# Patient Record
Sex: Female | Born: 1980 | Race: White | Hispanic: No | Marital: Married | Smoking: Never smoker
Health system: Southern US, Community
[De-identification: ages and names within clinical notes are randomized; demographics above are authoritative.]

## PROBLEM LIST (undated history)

## (undated) ENCOUNTER — Emergency Department: Discharge status: Absent without leave | Payer: Medicare Other

## (undated) DIAGNOSIS — F988 Other specified behavioral and emotional disorders with onset usually occurring in childhood and adolescence: Secondary | ICD-10-CM

## (undated) DIAGNOSIS — K219 Gastro-esophageal reflux disease without esophagitis: Secondary | ICD-10-CM

## (undated) DIAGNOSIS — K828 Other specified diseases of gallbladder: Secondary | ICD-10-CM

## (undated) DIAGNOSIS — F329 Major depressive disorder, single episode, unspecified: Secondary | ICD-10-CM

## (undated) DIAGNOSIS — E039 Hypothyroidism, unspecified: Secondary | ICD-10-CM

## (undated) DIAGNOSIS — G8929 Other chronic pain: Secondary | ICD-10-CM

## (undated) DIAGNOSIS — Z8719 Personal history of other diseases of the digestive system: Secondary | ICD-10-CM

## (undated) DIAGNOSIS — M1990 Unspecified osteoarthritis, unspecified site: Secondary | ICD-10-CM

## (undated) DIAGNOSIS — M069 Rheumatoid arthritis, unspecified: Secondary | ICD-10-CM

## (undated) DIAGNOSIS — J45909 Unspecified asthma, uncomplicated: Secondary | ICD-10-CM

## (undated) DIAGNOSIS — S0300XA Dislocation of jaw, unspecified side, initial encounter: Secondary | ICD-10-CM

## (undated) DIAGNOSIS — E119 Type 2 diabetes mellitus without complications: Secondary | ICD-10-CM

## (undated) DIAGNOSIS — M797 Fibromyalgia: Secondary | ICD-10-CM

## (undated) DIAGNOSIS — F32A Depression, unspecified: Secondary | ICD-10-CM

## (undated) DIAGNOSIS — E079 Disorder of thyroid, unspecified: Secondary | ICD-10-CM

## (undated) DIAGNOSIS — F819 Developmental disorder of scholastic skills, unspecified: Secondary | ICD-10-CM

## (undated) DIAGNOSIS — J42 Unspecified chronic bronchitis: Secondary | ICD-10-CM

## (undated) DIAGNOSIS — F419 Anxiety disorder, unspecified: Secondary | ICD-10-CM

## (undated) DIAGNOSIS — R5382 Chronic fatigue, unspecified: Secondary | ICD-10-CM

## (undated) HISTORY — DX: Unspecified chronic bronchitis: J42

## (undated) HISTORY — DX: Dislocation of jaw, unspecified side, initial encounter: S03.00XA

## (undated) HISTORY — DX: Type 2 diabetes mellitus without complications: E11.9

## (undated) HISTORY — DX: Personal history of other diseases of the digestive system: Z87.19

## (undated) HISTORY — DX: Rheumatoid arthritis, unspecified: M06.9

## (undated) HISTORY — DX: Other chronic pain: G89.29

## (undated) HISTORY — DX: Other specified behavioral and emotional disorders with onset usually occurring in childhood and adolescence: F98.8

## (undated) HISTORY — DX: Anxiety disorder, unspecified: F41.9

## (undated) HISTORY — DX: Major depressive disorder, single episode, unspecified: F32.9

## (undated) HISTORY — DX: Unspecified osteoarthritis, unspecified site: M19.90

## (undated) HISTORY — DX: Fibromyalgia: M79.7

## (undated) HISTORY — DX: Disorder of thyroid, unspecified: E07.9

## (undated) HISTORY — DX: Hypothyroidism, unspecified: E03.9

## (undated) HISTORY — DX: Chronic fatigue, unspecified: R53.82

## (undated) HISTORY — DX: Other specified diseases of gallbladder: K82.8

## (undated) HISTORY — DX: Depression, unspecified: F32.A

## (undated) HISTORY — DX: Developmental disorder of scholastic skills, unspecified: F81.9

---

## 2006-09-27 ENCOUNTER — Ambulatory Visit: Payer: Self-pay | Admitting: Internal Medicine

## 2006-10-25 ENCOUNTER — Ambulatory Visit: Payer: Self-pay | Admitting: Internal Medicine

## 2007-03-26 ENCOUNTER — Ambulatory Visit: Payer: Self-pay

## 2007-04-22 ENCOUNTER — Ambulatory Visit: Payer: Self-pay

## 2007-05-12 ENCOUNTER — Ambulatory Visit: Payer: Self-pay

## 2007-06-06 ENCOUNTER — Observation Stay: Payer: Self-pay | Admitting: Obstetrics and Gynecology

## 2007-06-17 ENCOUNTER — Ambulatory Visit: Payer: Self-pay

## 2007-06-18 ENCOUNTER — Inpatient Hospital Stay: Payer: Self-pay | Admitting: Unknown Physician Specialty

## 2007-07-08 ENCOUNTER — Ambulatory Visit: Payer: Self-pay | Admitting: Pediatrics

## 2007-07-16 ENCOUNTER — Emergency Department: Payer: Self-pay | Admitting: Emergency Medicine

## 2007-07-21 ENCOUNTER — Emergency Department: Payer: Self-pay | Admitting: Emergency Medicine

## 2007-07-29 ENCOUNTER — Ambulatory Visit: Payer: Self-pay

## 2007-08-12 ENCOUNTER — Ambulatory Visit: Payer: Self-pay

## 2007-11-07 ENCOUNTER — Ambulatory Visit: Payer: Self-pay | Admitting: Family Medicine

## 2008-01-08 ENCOUNTER — Ambulatory Visit: Payer: Self-pay

## 2008-03-10 ENCOUNTER — Ambulatory Visit: Payer: Self-pay | Admitting: Family Medicine

## 2008-04-06 ENCOUNTER — Emergency Department: Payer: Self-pay | Admitting: Internal Medicine

## 2008-07-12 ENCOUNTER — Emergency Department: Payer: Self-pay | Admitting: Internal Medicine

## 2008-07-21 ENCOUNTER — Emergency Department: Payer: Self-pay | Admitting: Unknown Physician Specialty

## 2009-09-27 ENCOUNTER — Other Ambulatory Visit: Payer: Self-pay | Admitting: Family Medicine

## 2010-11-03 ENCOUNTER — Encounter: Payer: Self-pay | Admitting: Orthopedic Surgery

## 2010-11-12 ENCOUNTER — Encounter: Payer: Self-pay | Admitting: Orthopedic Surgery

## 2010-12-12 ENCOUNTER — Encounter: Payer: Self-pay | Admitting: Orthopedic Surgery

## 2011-03-22 DIAGNOSIS — Z8739 Personal history of other diseases of the musculoskeletal system and connective tissue: Secondary | ICD-10-CM | POA: Insufficient documentation

## 2011-08-28 DIAGNOSIS — D235 Other benign neoplasm of skin of trunk: Secondary | ICD-10-CM | POA: Insufficient documentation

## 2011-08-28 DIAGNOSIS — L709 Acne, unspecified: Secondary | ICD-10-CM | POA: Insufficient documentation

## 2013-03-20 ENCOUNTER — Emergency Department: Payer: Self-pay | Admitting: Emergency Medicine

## 2013-03-20 DIAGNOSIS — Z888 Allergy status to other drugs, medicaments and biological substances status: Secondary | ICD-10-CM | POA: Diagnosis not present

## 2013-03-20 DIAGNOSIS — R110 Nausea: Secondary | ICD-10-CM | POA: Diagnosis not present

## 2013-03-20 DIAGNOSIS — N390 Urinary tract infection, site not specified: Secondary | ICD-10-CM | POA: Diagnosis not present

## 2013-03-20 DIAGNOSIS — R079 Chest pain, unspecified: Secondary | ICD-10-CM | POA: Diagnosis not present

## 2013-03-20 DIAGNOSIS — R42 Dizziness and giddiness: Secondary | ICD-10-CM | POA: Diagnosis not present

## 2013-03-20 DIAGNOSIS — F909 Attention-deficit hyperactivity disorder, unspecified type: Secondary | ICD-10-CM | POA: Diagnosis not present

## 2013-03-20 LAB — URINALYSIS, COMPLETE
Bilirubin,UR: NEGATIVE
GLUCOSE, UR: NEGATIVE mg/dL (ref 0–75)
KETONE: NEGATIVE
Nitrite: NEGATIVE
PROTEIN: NEGATIVE
Ph: 6 (ref 4.5–8.0)
RBC,UR: 6 /HPF (ref 0–5)
Specific Gravity: 1.002 (ref 1.003–1.030)
Squamous Epithelial: 1
WBC UR: 7 /HPF (ref 0–5)

## 2013-03-20 LAB — CBC
HCT: 39.9 % (ref 35.0–47.0)
HGB: 13.7 g/dL (ref 12.0–16.0)
MCH: 28.7 pg (ref 26.0–34.0)
MCHC: 34.3 g/dL (ref 32.0–36.0)
MCV: 84 fL (ref 80–100)
PLATELETS: 237 10*3/uL (ref 150–440)
RBC: 4.77 10*6/uL (ref 3.80–5.20)
RDW: 13.2 % (ref 11.5–14.5)
WBC: 8.2 10*3/uL (ref 3.6–11.0)

## 2013-03-20 LAB — BASIC METABOLIC PANEL
Anion Gap: 7 (ref 7–16)
BUN: 14 mg/dL (ref 7–18)
CHLORIDE: 103 mmol/L (ref 98–107)
Calcium, Total: 8.9 mg/dL (ref 8.5–10.1)
Co2: 27 mmol/L (ref 21–32)
Creatinine: 0.86 mg/dL (ref 0.60–1.30)
Glucose: 102 mg/dL — ABNORMAL HIGH (ref 65–99)
Osmolality: 274 (ref 275–301)
Potassium: 3.6 mmol/L (ref 3.5–5.1)
Sodium: 137 mmol/L (ref 136–145)

## 2013-03-20 LAB — TROPONIN I

## 2013-04-08 DIAGNOSIS — M999 Biomechanical lesion, unspecified: Secondary | ICD-10-CM | POA: Diagnosis not present

## 2013-04-08 DIAGNOSIS — M5430 Sciatica, unspecified side: Secondary | ICD-10-CM | POA: Diagnosis not present

## 2013-04-08 DIAGNOSIS — M5137 Other intervertebral disc degeneration, lumbosacral region: Secondary | ICD-10-CM | POA: Diagnosis not present

## 2013-04-08 DIAGNOSIS — IMO0002 Reserved for concepts with insufficient information to code with codable children: Secondary | ICD-10-CM | POA: Diagnosis not present

## 2013-04-10 DIAGNOSIS — IMO0002 Reserved for concepts with insufficient information to code with codable children: Secondary | ICD-10-CM | POA: Diagnosis not present

## 2013-04-10 DIAGNOSIS — M999 Biomechanical lesion, unspecified: Secondary | ICD-10-CM | POA: Diagnosis not present

## 2013-04-10 DIAGNOSIS — M5430 Sciatica, unspecified side: Secondary | ICD-10-CM | POA: Diagnosis not present

## 2013-04-10 DIAGNOSIS — M5137 Other intervertebral disc degeneration, lumbosacral region: Secondary | ICD-10-CM | POA: Diagnosis not present

## 2013-04-18 DIAGNOSIS — M5430 Sciatica, unspecified side: Secondary | ICD-10-CM | POA: Diagnosis not present

## 2013-04-18 DIAGNOSIS — M999 Biomechanical lesion, unspecified: Secondary | ICD-10-CM | POA: Diagnosis not present

## 2013-04-18 DIAGNOSIS — IMO0002 Reserved for concepts with insufficient information to code with codable children: Secondary | ICD-10-CM | POA: Diagnosis not present

## 2013-04-18 DIAGNOSIS — M5137 Other intervertebral disc degeneration, lumbosacral region: Secondary | ICD-10-CM | POA: Diagnosis not present

## 2013-04-21 DIAGNOSIS — M999 Biomechanical lesion, unspecified: Secondary | ICD-10-CM | POA: Diagnosis not present

## 2013-04-21 DIAGNOSIS — IMO0002 Reserved for concepts with insufficient information to code with codable children: Secondary | ICD-10-CM | POA: Diagnosis not present

## 2013-04-21 DIAGNOSIS — M5430 Sciatica, unspecified side: Secondary | ICD-10-CM | POA: Diagnosis not present

## 2013-04-21 DIAGNOSIS — M5137 Other intervertebral disc degeneration, lumbosacral region: Secondary | ICD-10-CM | POA: Diagnosis not present

## 2013-04-24 DIAGNOSIS — M5430 Sciatica, unspecified side: Secondary | ICD-10-CM | POA: Diagnosis not present

## 2013-04-24 DIAGNOSIS — IMO0002 Reserved for concepts with insufficient information to code with codable children: Secondary | ICD-10-CM | POA: Diagnosis not present

## 2013-04-24 DIAGNOSIS — M5137 Other intervertebral disc degeneration, lumbosacral region: Secondary | ICD-10-CM | POA: Diagnosis not present

## 2013-04-24 DIAGNOSIS — M999 Biomechanical lesion, unspecified: Secondary | ICD-10-CM | POA: Diagnosis not present

## 2013-05-05 DIAGNOSIS — IMO0002 Reserved for concepts with insufficient information to code with codable children: Secondary | ICD-10-CM | POA: Diagnosis not present

## 2013-05-05 DIAGNOSIS — M5430 Sciatica, unspecified side: Secondary | ICD-10-CM | POA: Diagnosis not present

## 2013-05-05 DIAGNOSIS — M5137 Other intervertebral disc degeneration, lumbosacral region: Secondary | ICD-10-CM | POA: Diagnosis not present

## 2013-05-05 DIAGNOSIS — M999 Biomechanical lesion, unspecified: Secondary | ICD-10-CM | POA: Diagnosis not present

## 2013-05-15 DIAGNOSIS — M999 Biomechanical lesion, unspecified: Secondary | ICD-10-CM | POA: Diagnosis not present

## 2013-05-15 DIAGNOSIS — M5137 Other intervertebral disc degeneration, lumbosacral region: Secondary | ICD-10-CM | POA: Diagnosis not present

## 2013-05-15 DIAGNOSIS — IMO0002 Reserved for concepts with insufficient information to code with codable children: Secondary | ICD-10-CM | POA: Diagnosis not present

## 2013-05-15 DIAGNOSIS — M5430 Sciatica, unspecified side: Secondary | ICD-10-CM | POA: Diagnosis not present

## 2013-05-21 DIAGNOSIS — M5137 Other intervertebral disc degeneration, lumbosacral region: Secondary | ICD-10-CM | POA: Diagnosis not present

## 2013-05-21 DIAGNOSIS — IMO0002 Reserved for concepts with insufficient information to code with codable children: Secondary | ICD-10-CM | POA: Diagnosis not present

## 2013-05-21 DIAGNOSIS — M5430 Sciatica, unspecified side: Secondary | ICD-10-CM | POA: Diagnosis not present

## 2013-05-21 DIAGNOSIS — M999 Biomechanical lesion, unspecified: Secondary | ICD-10-CM | POA: Diagnosis not present

## 2013-06-10 DIAGNOSIS — M5430 Sciatica, unspecified side: Secondary | ICD-10-CM | POA: Diagnosis not present

## 2013-06-10 DIAGNOSIS — IMO0002 Reserved for concepts with insufficient information to code with codable children: Secondary | ICD-10-CM | POA: Diagnosis not present

## 2013-06-10 DIAGNOSIS — M5137 Other intervertebral disc degeneration, lumbosacral region: Secondary | ICD-10-CM | POA: Diagnosis not present

## 2013-06-10 DIAGNOSIS — M999 Biomechanical lesion, unspecified: Secondary | ICD-10-CM | POA: Diagnosis not present

## 2013-06-30 DIAGNOSIS — M5137 Other intervertebral disc degeneration, lumbosacral region: Secondary | ICD-10-CM | POA: Diagnosis not present

## 2013-06-30 DIAGNOSIS — M999 Biomechanical lesion, unspecified: Secondary | ICD-10-CM | POA: Diagnosis not present

## 2013-06-30 DIAGNOSIS — M5430 Sciatica, unspecified side: Secondary | ICD-10-CM | POA: Diagnosis not present

## 2013-06-30 DIAGNOSIS — Z79899 Other long term (current) drug therapy: Secondary | ICD-10-CM | POA: Diagnosis not present

## 2013-06-30 DIAGNOSIS — F411 Generalized anxiety disorder: Secondary | ICD-10-CM | POA: Diagnosis not present

## 2013-06-30 DIAGNOSIS — F988 Other specified behavioral and emotional disorders with onset usually occurring in childhood and adolescence: Secondary | ICD-10-CM | POA: Diagnosis not present

## 2013-06-30 DIAGNOSIS — IMO0002 Reserved for concepts with insufficient information to code with codable children: Secondary | ICD-10-CM | POA: Diagnosis not present

## 2013-07-24 DIAGNOSIS — M5137 Other intervertebral disc degeneration, lumbosacral region: Secondary | ICD-10-CM | POA: Diagnosis not present

## 2013-07-24 DIAGNOSIS — M5430 Sciatica, unspecified side: Secondary | ICD-10-CM | POA: Diagnosis not present

## 2013-07-24 DIAGNOSIS — M999 Biomechanical lesion, unspecified: Secondary | ICD-10-CM | POA: Diagnosis not present

## 2013-07-24 DIAGNOSIS — IMO0002 Reserved for concepts with insufficient information to code with codable children: Secondary | ICD-10-CM | POA: Diagnosis not present

## 2013-08-21 DIAGNOSIS — M5430 Sciatica, unspecified side: Secondary | ICD-10-CM | POA: Diagnosis not present

## 2013-08-21 DIAGNOSIS — IMO0002 Reserved for concepts with insufficient information to code with codable children: Secondary | ICD-10-CM | POA: Diagnosis not present

## 2013-08-21 DIAGNOSIS — M5137 Other intervertebral disc degeneration, lumbosacral region: Secondary | ICD-10-CM | POA: Diagnosis not present

## 2013-08-21 DIAGNOSIS — M999 Biomechanical lesion, unspecified: Secondary | ICD-10-CM | POA: Diagnosis not present

## 2013-09-08 DIAGNOSIS — M999 Biomechanical lesion, unspecified: Secondary | ICD-10-CM | POA: Diagnosis not present

## 2013-09-08 DIAGNOSIS — IMO0002 Reserved for concepts with insufficient information to code with codable children: Secondary | ICD-10-CM | POA: Diagnosis not present

## 2013-09-08 DIAGNOSIS — M5137 Other intervertebral disc degeneration, lumbosacral region: Secondary | ICD-10-CM | POA: Diagnosis not present

## 2013-09-08 DIAGNOSIS — M5430 Sciatica, unspecified side: Secondary | ICD-10-CM | POA: Diagnosis not present

## 2013-09-18 DIAGNOSIS — M999 Biomechanical lesion, unspecified: Secondary | ICD-10-CM | POA: Diagnosis not present

## 2013-09-18 DIAGNOSIS — M5137 Other intervertebral disc degeneration, lumbosacral region: Secondary | ICD-10-CM | POA: Diagnosis not present

## 2013-09-18 DIAGNOSIS — M5430 Sciatica, unspecified side: Secondary | ICD-10-CM | POA: Diagnosis not present

## 2013-09-18 DIAGNOSIS — IMO0002 Reserved for concepts with insufficient information to code with codable children: Secondary | ICD-10-CM | POA: Diagnosis not present

## 2013-09-23 DIAGNOSIS — M5137 Other intervertebral disc degeneration, lumbosacral region: Secondary | ICD-10-CM | POA: Diagnosis not present

## 2013-09-23 DIAGNOSIS — M999 Biomechanical lesion, unspecified: Secondary | ICD-10-CM | POA: Diagnosis not present

## 2013-09-23 DIAGNOSIS — M5430 Sciatica, unspecified side: Secondary | ICD-10-CM | POA: Diagnosis not present

## 2013-09-23 DIAGNOSIS — IMO0002 Reserved for concepts with insufficient information to code with codable children: Secondary | ICD-10-CM | POA: Diagnosis not present

## 2013-10-06 DIAGNOSIS — IMO0002 Reserved for concepts with insufficient information to code with codable children: Secondary | ICD-10-CM | POA: Diagnosis not present

## 2013-10-06 DIAGNOSIS — M999 Biomechanical lesion, unspecified: Secondary | ICD-10-CM | POA: Diagnosis not present

## 2013-10-06 DIAGNOSIS — M5430 Sciatica, unspecified side: Secondary | ICD-10-CM | POA: Diagnosis not present

## 2013-10-06 DIAGNOSIS — M5137 Other intervertebral disc degeneration, lumbosacral region: Secondary | ICD-10-CM | POA: Diagnosis not present

## 2013-10-09 DIAGNOSIS — F411 Generalized anxiety disorder: Secondary | ICD-10-CM | POA: Diagnosis not present

## 2013-10-09 DIAGNOSIS — F988 Other specified behavioral and emotional disorders with onset usually occurring in childhood and adolescence: Secondary | ICD-10-CM | POA: Diagnosis not present

## 2013-10-27 DIAGNOSIS — G894 Chronic pain syndrome: Secondary | ICD-10-CM | POA: Diagnosis not present

## 2013-10-27 DIAGNOSIS — R5383 Other fatigue: Secondary | ICD-10-CM | POA: Diagnosis not present

## 2013-10-27 DIAGNOSIS — R110 Nausea: Secondary | ICD-10-CM | POA: Diagnosis not present

## 2013-10-27 DIAGNOSIS — E039 Hypothyroidism, unspecified: Secondary | ICD-10-CM | POA: Diagnosis not present

## 2013-10-27 DIAGNOSIS — R1011 Right upper quadrant pain: Secondary | ICD-10-CM | POA: Diagnosis not present

## 2013-10-27 DIAGNOSIS — IMO0001 Reserved for inherently not codable concepts without codable children: Secondary | ICD-10-CM | POA: Diagnosis not present

## 2013-10-27 DIAGNOSIS — R5381 Other malaise: Secondary | ICD-10-CM | POA: Diagnosis not present

## 2013-10-27 DIAGNOSIS — Z8739 Personal history of other diseases of the musculoskeletal system and connective tissue: Secondary | ICD-10-CM | POA: Diagnosis not present

## 2013-10-29 DIAGNOSIS — M999 Biomechanical lesion, unspecified: Secondary | ICD-10-CM | POA: Diagnosis not present

## 2013-10-29 DIAGNOSIS — M5430 Sciatica, unspecified side: Secondary | ICD-10-CM | POA: Diagnosis not present

## 2013-10-29 DIAGNOSIS — IMO0002 Reserved for concepts with insufficient information to code with codable children: Secondary | ICD-10-CM | POA: Diagnosis not present

## 2013-10-29 DIAGNOSIS — M5137 Other intervertebral disc degeneration, lumbosacral region: Secondary | ICD-10-CM | POA: Diagnosis not present

## 2013-12-08 DIAGNOSIS — IMO0002 Reserved for concepts with insufficient information to code with codable children: Secondary | ICD-10-CM | POA: Diagnosis not present

## 2013-12-08 DIAGNOSIS — M5137 Other intervertebral disc degeneration, lumbosacral region: Secondary | ICD-10-CM | POA: Diagnosis not present

## 2013-12-08 DIAGNOSIS — M5430 Sciatica, unspecified side: Secondary | ICD-10-CM | POA: Diagnosis not present

## 2013-12-08 DIAGNOSIS — M999 Biomechanical lesion, unspecified: Secondary | ICD-10-CM | POA: Diagnosis not present

## 2013-12-23 DIAGNOSIS — M9902 Segmental and somatic dysfunction of thoracic region: Secondary | ICD-10-CM | POA: Diagnosis not present

## 2013-12-23 DIAGNOSIS — M955 Acquired deformity of pelvis: Secondary | ICD-10-CM | POA: Diagnosis not present

## 2013-12-23 DIAGNOSIS — M546 Pain in thoracic spine: Secondary | ICD-10-CM | POA: Diagnosis not present

## 2013-12-23 DIAGNOSIS — M9903 Segmental and somatic dysfunction of lumbar region: Secondary | ICD-10-CM | POA: Diagnosis not present

## 2013-12-23 DIAGNOSIS — M9905 Segmental and somatic dysfunction of pelvic region: Secondary | ICD-10-CM | POA: Diagnosis not present

## 2013-12-23 DIAGNOSIS — M5442 Lumbago with sciatica, left side: Secondary | ICD-10-CM | POA: Diagnosis not present

## 2013-12-23 DIAGNOSIS — M5441 Lumbago with sciatica, right side: Secondary | ICD-10-CM | POA: Diagnosis not present

## 2013-12-25 DIAGNOSIS — M5441 Lumbago with sciatica, right side: Secondary | ICD-10-CM | POA: Diagnosis not present

## 2013-12-25 DIAGNOSIS — M5442 Lumbago with sciatica, left side: Secondary | ICD-10-CM | POA: Diagnosis not present

## 2013-12-25 DIAGNOSIS — M546 Pain in thoracic spine: Secondary | ICD-10-CM | POA: Diagnosis not present

## 2013-12-25 DIAGNOSIS — M955 Acquired deformity of pelvis: Secondary | ICD-10-CM | POA: Diagnosis not present

## 2013-12-25 DIAGNOSIS — M9905 Segmental and somatic dysfunction of pelvic region: Secondary | ICD-10-CM | POA: Diagnosis not present

## 2013-12-25 DIAGNOSIS — M9902 Segmental and somatic dysfunction of thoracic region: Secondary | ICD-10-CM | POA: Diagnosis not present

## 2013-12-25 DIAGNOSIS — M9903 Segmental and somatic dysfunction of lumbar region: Secondary | ICD-10-CM | POA: Diagnosis not present

## 2013-12-31 DIAGNOSIS — M5442 Lumbago with sciatica, left side: Secondary | ICD-10-CM | POA: Diagnosis not present

## 2013-12-31 DIAGNOSIS — M5441 Lumbago with sciatica, right side: Secondary | ICD-10-CM | POA: Diagnosis not present

## 2013-12-31 DIAGNOSIS — M9903 Segmental and somatic dysfunction of lumbar region: Secondary | ICD-10-CM | POA: Diagnosis not present

## 2013-12-31 DIAGNOSIS — M955 Acquired deformity of pelvis: Secondary | ICD-10-CM | POA: Diagnosis not present

## 2013-12-31 DIAGNOSIS — M546 Pain in thoracic spine: Secondary | ICD-10-CM | POA: Diagnosis not present

## 2013-12-31 DIAGNOSIS — M9902 Segmental and somatic dysfunction of thoracic region: Secondary | ICD-10-CM | POA: Diagnosis not present

## 2013-12-31 DIAGNOSIS — M9905 Segmental and somatic dysfunction of pelvic region: Secondary | ICD-10-CM | POA: Diagnosis not present

## 2014-01-10 DIAGNOSIS — Z23 Encounter for immunization: Secondary | ICD-10-CM | POA: Diagnosis not present

## 2014-01-10 DIAGNOSIS — E038 Other specified hypothyroidism: Secondary | ICD-10-CM | POA: Diagnosis not present

## 2014-01-23 DIAGNOSIS — M9905 Segmental and somatic dysfunction of pelvic region: Secondary | ICD-10-CM | POA: Diagnosis not present

## 2014-01-23 DIAGNOSIS — M5441 Lumbago with sciatica, right side: Secondary | ICD-10-CM | POA: Diagnosis not present

## 2014-01-23 DIAGNOSIS — M955 Acquired deformity of pelvis: Secondary | ICD-10-CM | POA: Diagnosis not present

## 2014-01-23 DIAGNOSIS — M546 Pain in thoracic spine: Secondary | ICD-10-CM | POA: Diagnosis not present

## 2014-01-23 DIAGNOSIS — M9903 Segmental and somatic dysfunction of lumbar region: Secondary | ICD-10-CM | POA: Diagnosis not present

## 2014-01-23 DIAGNOSIS — M5442 Lumbago with sciatica, left side: Secondary | ICD-10-CM | POA: Diagnosis not present

## 2014-01-23 DIAGNOSIS — M9902 Segmental and somatic dysfunction of thoracic region: Secondary | ICD-10-CM | POA: Diagnosis not present

## 2014-01-29 DIAGNOSIS — M9905 Segmental and somatic dysfunction of pelvic region: Secondary | ICD-10-CM | POA: Diagnosis not present

## 2014-01-29 DIAGNOSIS — M5442 Lumbago with sciatica, left side: Secondary | ICD-10-CM | POA: Diagnosis not present

## 2014-01-29 DIAGNOSIS — M546 Pain in thoracic spine: Secondary | ICD-10-CM | POA: Diagnosis not present

## 2014-01-29 DIAGNOSIS — M9903 Segmental and somatic dysfunction of lumbar region: Secondary | ICD-10-CM | POA: Diagnosis not present

## 2014-01-29 DIAGNOSIS — M9902 Segmental and somatic dysfunction of thoracic region: Secondary | ICD-10-CM | POA: Diagnosis not present

## 2014-01-29 DIAGNOSIS — M955 Acquired deformity of pelvis: Secondary | ICD-10-CM | POA: Diagnosis not present

## 2014-01-29 DIAGNOSIS — M5441 Lumbago with sciatica, right side: Secondary | ICD-10-CM | POA: Diagnosis not present

## 2014-02-12 DIAGNOSIS — M5441 Lumbago with sciatica, right side: Secondary | ICD-10-CM | POA: Diagnosis not present

## 2014-02-12 DIAGNOSIS — M9905 Segmental and somatic dysfunction of pelvic region: Secondary | ICD-10-CM | POA: Diagnosis not present

## 2014-02-12 DIAGNOSIS — M9902 Segmental and somatic dysfunction of thoracic region: Secondary | ICD-10-CM | POA: Diagnosis not present

## 2014-02-12 DIAGNOSIS — M9903 Segmental and somatic dysfunction of lumbar region: Secondary | ICD-10-CM | POA: Diagnosis not present

## 2014-02-12 DIAGNOSIS — M955 Acquired deformity of pelvis: Secondary | ICD-10-CM | POA: Diagnosis not present

## 2014-02-12 DIAGNOSIS — M546 Pain in thoracic spine: Secondary | ICD-10-CM | POA: Diagnosis not present

## 2014-02-12 DIAGNOSIS — M5442 Lumbago with sciatica, left side: Secondary | ICD-10-CM | POA: Diagnosis not present

## 2014-02-19 DIAGNOSIS — Z79899 Other long term (current) drug therapy: Secondary | ICD-10-CM | POA: Diagnosis not present

## 2014-02-19 DIAGNOSIS — F900 Attention-deficit hyperactivity disorder, predominantly inattentive type: Secondary | ICD-10-CM | POA: Diagnosis not present

## 2014-02-19 DIAGNOSIS — F411 Generalized anxiety disorder: Secondary | ICD-10-CM | POA: Diagnosis not present

## 2014-02-25 DIAGNOSIS — Z0289 Encounter for other administrative examinations: Secondary | ICD-10-CM | POA: Diagnosis not present

## 2014-02-25 DIAGNOSIS — M797 Fibromyalgia: Secondary | ICD-10-CM | POA: Diagnosis not present

## 2014-02-25 DIAGNOSIS — Z8739 Personal history of other diseases of the musculoskeletal system and connective tissue: Secondary | ICD-10-CM | POA: Diagnosis not present

## 2014-02-25 DIAGNOSIS — G894 Chronic pain syndrome: Secondary | ICD-10-CM | POA: Insufficient documentation

## 2014-02-26 DIAGNOSIS — M546 Pain in thoracic spine: Secondary | ICD-10-CM | POA: Diagnosis not present

## 2014-02-26 DIAGNOSIS — M9902 Segmental and somatic dysfunction of thoracic region: Secondary | ICD-10-CM | POA: Diagnosis not present

## 2014-02-26 DIAGNOSIS — M5441 Lumbago with sciatica, right side: Secondary | ICD-10-CM | POA: Diagnosis not present

## 2014-02-26 DIAGNOSIS — M9903 Segmental and somatic dysfunction of lumbar region: Secondary | ICD-10-CM | POA: Diagnosis not present

## 2014-02-26 DIAGNOSIS — M955 Acquired deformity of pelvis: Secondary | ICD-10-CM | POA: Diagnosis not present

## 2014-02-26 DIAGNOSIS — M9905 Segmental and somatic dysfunction of pelvic region: Secondary | ICD-10-CM | POA: Diagnosis not present

## 2014-02-26 DIAGNOSIS — M5442 Lumbago with sciatica, left side: Secondary | ICD-10-CM | POA: Diagnosis not present

## 2014-03-02 DIAGNOSIS — J069 Acute upper respiratory infection, unspecified: Secondary | ICD-10-CM | POA: Diagnosis not present

## 2014-03-02 DIAGNOSIS — Z112 Encounter for screening for other bacterial diseases: Secondary | ICD-10-CM | POA: Diagnosis not present

## 2014-03-19 DIAGNOSIS — M9901 Segmental and somatic dysfunction of cervical region: Secondary | ICD-10-CM | POA: Diagnosis not present

## 2014-03-19 DIAGNOSIS — M531 Cervicobrachial syndrome: Secondary | ICD-10-CM | POA: Diagnosis not present

## 2014-03-19 DIAGNOSIS — M9903 Segmental and somatic dysfunction of lumbar region: Secondary | ICD-10-CM | POA: Diagnosis not present

## 2014-03-19 DIAGNOSIS — M955 Acquired deformity of pelvis: Secondary | ICD-10-CM | POA: Diagnosis not present

## 2014-03-19 DIAGNOSIS — M9905 Segmental and somatic dysfunction of pelvic region: Secondary | ICD-10-CM | POA: Diagnosis not present

## 2014-03-19 DIAGNOSIS — M5441 Lumbago with sciatica, right side: Secondary | ICD-10-CM | POA: Diagnosis not present

## 2014-04-10 DIAGNOSIS — N760 Acute vaginitis: Secondary | ICD-10-CM | POA: Diagnosis not present

## 2014-04-10 DIAGNOSIS — K640 First degree hemorrhoids: Secondary | ICD-10-CM | POA: Diagnosis not present

## 2014-04-10 DIAGNOSIS — E034 Atrophy of thyroid (acquired): Secondary | ICD-10-CM | POA: Diagnosis not present

## 2014-04-10 DIAGNOSIS — E038 Other specified hypothyroidism: Secondary | ICD-10-CM | POA: Diagnosis not present

## 2014-04-13 DIAGNOSIS — M5441 Lumbago with sciatica, right side: Secondary | ICD-10-CM | POA: Diagnosis not present

## 2014-04-13 DIAGNOSIS — M531 Cervicobrachial syndrome: Secondary | ICD-10-CM | POA: Diagnosis not present

## 2014-04-13 DIAGNOSIS — M9903 Segmental and somatic dysfunction of lumbar region: Secondary | ICD-10-CM | POA: Diagnosis not present

## 2014-04-13 DIAGNOSIS — M9905 Segmental and somatic dysfunction of pelvic region: Secondary | ICD-10-CM | POA: Diagnosis not present

## 2014-04-13 DIAGNOSIS — M955 Acquired deformity of pelvis: Secondary | ICD-10-CM | POA: Diagnosis not present

## 2014-04-13 DIAGNOSIS — M9901 Segmental and somatic dysfunction of cervical region: Secondary | ICD-10-CM | POA: Diagnosis not present

## 2014-05-06 DIAGNOSIS — M9903 Segmental and somatic dysfunction of lumbar region: Secondary | ICD-10-CM | POA: Diagnosis not present

## 2014-05-06 DIAGNOSIS — M9905 Segmental and somatic dysfunction of pelvic region: Secondary | ICD-10-CM | POA: Diagnosis not present

## 2014-05-06 DIAGNOSIS — M955 Acquired deformity of pelvis: Secondary | ICD-10-CM | POA: Diagnosis not present

## 2014-05-06 DIAGNOSIS — M5441 Lumbago with sciatica, right side: Secondary | ICD-10-CM | POA: Diagnosis not present

## 2014-05-06 DIAGNOSIS — M9901 Segmental and somatic dysfunction of cervical region: Secondary | ICD-10-CM | POA: Diagnosis not present

## 2014-05-06 DIAGNOSIS — M531 Cervicobrachial syndrome: Secondary | ICD-10-CM | POA: Diagnosis not present

## 2014-05-13 DIAGNOSIS — M955 Acquired deformity of pelvis: Secondary | ICD-10-CM | POA: Diagnosis not present

## 2014-05-13 DIAGNOSIS — M531 Cervicobrachial syndrome: Secondary | ICD-10-CM | POA: Diagnosis not present

## 2014-05-13 DIAGNOSIS — K5900 Constipation, unspecified: Secondary | ICD-10-CM | POA: Diagnosis not present

## 2014-05-13 DIAGNOSIS — Z1389 Encounter for screening for other disorder: Secondary | ICD-10-CM | POA: Diagnosis not present

## 2014-05-13 DIAGNOSIS — M9901 Segmental and somatic dysfunction of cervical region: Secondary | ICD-10-CM | POA: Diagnosis not present

## 2014-05-13 DIAGNOSIS — M9905 Segmental and somatic dysfunction of pelvic region: Secondary | ICD-10-CM | POA: Diagnosis not present

## 2014-05-13 DIAGNOSIS — R062 Wheezing: Secondary | ICD-10-CM | POA: Diagnosis not present

## 2014-05-13 DIAGNOSIS — M5441 Lumbago with sciatica, right side: Secondary | ICD-10-CM | POA: Diagnosis not present

## 2014-05-13 DIAGNOSIS — M9903 Segmental and somatic dysfunction of lumbar region: Secondary | ICD-10-CM | POA: Diagnosis not present

## 2014-05-13 DIAGNOSIS — J0110 Acute frontal sinusitis, unspecified: Secondary | ICD-10-CM | POA: Diagnosis not present

## 2014-05-21 DIAGNOSIS — M9905 Segmental and somatic dysfunction of pelvic region: Secondary | ICD-10-CM | POA: Diagnosis not present

## 2014-05-21 DIAGNOSIS — M531 Cervicobrachial syndrome: Secondary | ICD-10-CM | POA: Diagnosis not present

## 2014-05-21 DIAGNOSIS — M9903 Segmental and somatic dysfunction of lumbar region: Secondary | ICD-10-CM | POA: Diagnosis not present

## 2014-05-21 DIAGNOSIS — M955 Acquired deformity of pelvis: Secondary | ICD-10-CM | POA: Diagnosis not present

## 2014-05-21 DIAGNOSIS — M9901 Segmental and somatic dysfunction of cervical region: Secondary | ICD-10-CM | POA: Diagnosis not present

## 2014-05-21 DIAGNOSIS — M5441 Lumbago with sciatica, right side: Secondary | ICD-10-CM | POA: Diagnosis not present

## 2014-05-25 DIAGNOSIS — M5441 Lumbago with sciatica, right side: Secondary | ICD-10-CM | POA: Diagnosis not present

## 2014-05-25 DIAGNOSIS — M9901 Segmental and somatic dysfunction of cervical region: Secondary | ICD-10-CM | POA: Diagnosis not present

## 2014-05-25 DIAGNOSIS — M9905 Segmental and somatic dysfunction of pelvic region: Secondary | ICD-10-CM | POA: Diagnosis not present

## 2014-05-25 DIAGNOSIS — M531 Cervicobrachial syndrome: Secondary | ICD-10-CM | POA: Diagnosis not present

## 2014-05-25 DIAGNOSIS — M955 Acquired deformity of pelvis: Secondary | ICD-10-CM | POA: Diagnosis not present

## 2014-05-25 DIAGNOSIS — M9903 Segmental and somatic dysfunction of lumbar region: Secondary | ICD-10-CM | POA: Diagnosis not present

## 2014-05-27 DIAGNOSIS — M955 Acquired deformity of pelvis: Secondary | ICD-10-CM | POA: Diagnosis not present

## 2014-05-27 DIAGNOSIS — M9903 Segmental and somatic dysfunction of lumbar region: Secondary | ICD-10-CM | POA: Diagnosis not present

## 2014-05-27 DIAGNOSIS — M9901 Segmental and somatic dysfunction of cervical region: Secondary | ICD-10-CM | POA: Diagnosis not present

## 2014-05-27 DIAGNOSIS — M5441 Lumbago with sciatica, right side: Secondary | ICD-10-CM | POA: Diagnosis not present

## 2014-05-27 DIAGNOSIS — M531 Cervicobrachial syndrome: Secondary | ICD-10-CM | POA: Diagnosis not present

## 2014-05-27 DIAGNOSIS — M9905 Segmental and somatic dysfunction of pelvic region: Secondary | ICD-10-CM | POA: Diagnosis not present

## 2014-05-28 DIAGNOSIS — M797 Fibromyalgia: Secondary | ICD-10-CM | POA: Diagnosis not present

## 2014-05-28 DIAGNOSIS — Z8739 Personal history of other diseases of the musculoskeletal system and connective tissue: Secondary | ICD-10-CM | POA: Diagnosis not present

## 2014-05-28 DIAGNOSIS — Z0289 Encounter for other administrative examinations: Secondary | ICD-10-CM | POA: Diagnosis not present

## 2014-05-28 DIAGNOSIS — G894 Chronic pain syndrome: Secondary | ICD-10-CM | POA: Diagnosis not present

## 2014-05-29 DIAGNOSIS — Z9109 Other allergy status, other than to drugs and biological substances: Secondary | ICD-10-CM | POA: Diagnosis not present

## 2014-05-29 DIAGNOSIS — E039 Hypothyroidism, unspecified: Secondary | ICD-10-CM | POA: Diagnosis not present

## 2014-06-17 DIAGNOSIS — M955 Acquired deformity of pelvis: Secondary | ICD-10-CM | POA: Diagnosis not present

## 2014-06-17 DIAGNOSIS — M9903 Segmental and somatic dysfunction of lumbar region: Secondary | ICD-10-CM | POA: Diagnosis not present

## 2014-06-17 DIAGNOSIS — M9901 Segmental and somatic dysfunction of cervical region: Secondary | ICD-10-CM | POA: Diagnosis not present

## 2014-06-17 DIAGNOSIS — M9905 Segmental and somatic dysfunction of pelvic region: Secondary | ICD-10-CM | POA: Diagnosis not present

## 2014-06-17 DIAGNOSIS — M5441 Lumbago with sciatica, right side: Secondary | ICD-10-CM | POA: Diagnosis not present

## 2014-06-17 DIAGNOSIS — M531 Cervicobrachial syndrome: Secondary | ICD-10-CM | POA: Diagnosis not present

## 2014-06-22 DIAGNOSIS — Z79899 Other long term (current) drug therapy: Secondary | ICD-10-CM | POA: Diagnosis not present

## 2014-06-22 DIAGNOSIS — F411 Generalized anxiety disorder: Secondary | ICD-10-CM | POA: Diagnosis not present

## 2014-06-22 DIAGNOSIS — F900 Attention-deficit hyperactivity disorder, predominantly inattentive type: Secondary | ICD-10-CM | POA: Diagnosis not present

## 2014-07-01 DIAGNOSIS — M531 Cervicobrachial syndrome: Secondary | ICD-10-CM | POA: Diagnosis not present

## 2014-07-01 DIAGNOSIS — M9905 Segmental and somatic dysfunction of pelvic region: Secondary | ICD-10-CM | POA: Diagnosis not present

## 2014-07-01 DIAGNOSIS — M9903 Segmental and somatic dysfunction of lumbar region: Secondary | ICD-10-CM | POA: Diagnosis not present

## 2014-07-01 DIAGNOSIS — M5441 Lumbago with sciatica, right side: Secondary | ICD-10-CM | POA: Diagnosis not present

## 2014-07-01 DIAGNOSIS — M955 Acquired deformity of pelvis: Secondary | ICD-10-CM | POA: Diagnosis not present

## 2014-07-01 DIAGNOSIS — M9901 Segmental and somatic dysfunction of cervical region: Secondary | ICD-10-CM | POA: Diagnosis not present

## 2014-07-07 DIAGNOSIS — M955 Acquired deformity of pelvis: Secondary | ICD-10-CM | POA: Diagnosis not present

## 2014-07-07 DIAGNOSIS — M5441 Lumbago with sciatica, right side: Secondary | ICD-10-CM | POA: Diagnosis not present

## 2014-07-07 DIAGNOSIS — M9903 Segmental and somatic dysfunction of lumbar region: Secondary | ICD-10-CM | POA: Diagnosis not present

## 2014-07-07 DIAGNOSIS — M531 Cervicobrachial syndrome: Secondary | ICD-10-CM | POA: Diagnosis not present

## 2014-07-07 DIAGNOSIS — M9905 Segmental and somatic dysfunction of pelvic region: Secondary | ICD-10-CM | POA: Diagnosis not present

## 2014-07-07 DIAGNOSIS — M9901 Segmental and somatic dysfunction of cervical region: Secondary | ICD-10-CM | POA: Diagnosis not present

## 2014-08-06 DIAGNOSIS — M955 Acquired deformity of pelvis: Secondary | ICD-10-CM | POA: Diagnosis not present

## 2014-08-06 DIAGNOSIS — M9901 Segmental and somatic dysfunction of cervical region: Secondary | ICD-10-CM | POA: Diagnosis not present

## 2014-08-06 DIAGNOSIS — M5441 Lumbago with sciatica, right side: Secondary | ICD-10-CM | POA: Diagnosis not present

## 2014-08-06 DIAGNOSIS — M9903 Segmental and somatic dysfunction of lumbar region: Secondary | ICD-10-CM | POA: Diagnosis not present

## 2014-08-06 DIAGNOSIS — M9905 Segmental and somatic dysfunction of pelvic region: Secondary | ICD-10-CM | POA: Diagnosis not present

## 2014-08-06 DIAGNOSIS — M531 Cervicobrachial syndrome: Secondary | ICD-10-CM | POA: Diagnosis not present

## 2014-09-23 DIAGNOSIS — M7989 Other specified soft tissue disorders: Secondary | ICD-10-CM | POA: Diagnosis not present

## 2014-09-23 DIAGNOSIS — F900 Attention-deficit hyperactivity disorder, predominantly inattentive type: Secondary | ICD-10-CM | POA: Diagnosis not present

## 2014-09-23 DIAGNOSIS — F411 Generalized anxiety disorder: Secondary | ICD-10-CM | POA: Diagnosis not present

## 2014-09-23 DIAGNOSIS — Z79899 Other long term (current) drug therapy: Secondary | ICD-10-CM | POA: Diagnosis not present

## 2014-09-23 DIAGNOSIS — M797 Fibromyalgia: Secondary | ICD-10-CM | POA: Diagnosis not present

## 2014-09-23 DIAGNOSIS — Z79891 Long term (current) use of opiate analgesic: Secondary | ICD-10-CM | POA: Diagnosis not present

## 2014-09-23 DIAGNOSIS — Z8739 Personal history of other diseases of the musculoskeletal system and connective tissue: Secondary | ICD-10-CM | POA: Diagnosis not present

## 2014-09-23 DIAGNOSIS — M25561 Pain in right knee: Secondary | ICD-10-CM | POA: Diagnosis not present

## 2014-09-23 DIAGNOSIS — G894 Chronic pain syndrome: Secondary | ICD-10-CM | POA: Diagnosis not present

## 2014-09-23 DIAGNOSIS — R5382 Chronic fatigue, unspecified: Secondary | ICD-10-CM | POA: Diagnosis not present

## 2014-09-23 DIAGNOSIS — R102 Pelvic and perineal pain: Secondary | ICD-10-CM | POA: Diagnosis not present

## 2014-09-28 DIAGNOSIS — M955 Acquired deformity of pelvis: Secondary | ICD-10-CM | POA: Diagnosis not present

## 2014-09-28 DIAGNOSIS — M5441 Lumbago with sciatica, right side: Secondary | ICD-10-CM | POA: Diagnosis not present

## 2014-09-28 DIAGNOSIS — M9901 Segmental and somatic dysfunction of cervical region: Secondary | ICD-10-CM | POA: Diagnosis not present

## 2014-09-28 DIAGNOSIS — M531 Cervicobrachial syndrome: Secondary | ICD-10-CM | POA: Diagnosis not present

## 2014-09-28 DIAGNOSIS — M9903 Segmental and somatic dysfunction of lumbar region: Secondary | ICD-10-CM | POA: Diagnosis not present

## 2014-09-28 DIAGNOSIS — M9905 Segmental and somatic dysfunction of pelvic region: Secondary | ICD-10-CM | POA: Diagnosis not present

## 2014-12-03 DIAGNOSIS — J309 Allergic rhinitis, unspecified: Secondary | ICD-10-CM | POA: Diagnosis not present

## 2014-12-03 DIAGNOSIS — F419 Anxiety disorder, unspecified: Secondary | ICD-10-CM | POA: Diagnosis not present

## 2014-12-03 DIAGNOSIS — J45909 Unspecified asthma, uncomplicated: Secondary | ICD-10-CM | POA: Diagnosis not present

## 2014-12-03 DIAGNOSIS — R040 Epistaxis: Secondary | ICD-10-CM | POA: Diagnosis not present

## 2014-12-03 DIAGNOSIS — F329 Major depressive disorder, single episode, unspecified: Secondary | ICD-10-CM | POA: Diagnosis not present

## 2014-12-03 DIAGNOSIS — J3089 Other allergic rhinitis: Secondary | ICD-10-CM | POA: Diagnosis not present

## 2014-12-03 DIAGNOSIS — F900 Attention-deficit hyperactivity disorder, predominantly inattentive type: Secondary | ICD-10-CM | POA: Diagnosis not present

## 2014-12-03 DIAGNOSIS — M0890 Juvenile arthritis, unspecified, unspecified site: Secondary | ICD-10-CM | POA: Diagnosis not present

## 2014-12-03 DIAGNOSIS — E079 Disorder of thyroid, unspecified: Secondary | ICD-10-CM | POA: Diagnosis not present

## 2014-12-03 DIAGNOSIS — M7581 Other shoulder lesions, right shoulder: Secondary | ICD-10-CM | POA: Diagnosis not present

## 2014-12-03 DIAGNOSIS — M797 Fibromyalgia: Secondary | ICD-10-CM | POA: Diagnosis not present

## 2014-12-14 DIAGNOSIS — R102 Pelvic and perineal pain: Secondary | ICD-10-CM | POA: Diagnosis not present

## 2014-12-14 DIAGNOSIS — M797 Fibromyalgia: Secondary | ICD-10-CM | POA: Diagnosis not present

## 2014-12-14 DIAGNOSIS — M79609 Pain in unspecified limb: Secondary | ICD-10-CM | POA: Diagnosis not present

## 2014-12-23 DIAGNOSIS — Z79899 Other long term (current) drug therapy: Secondary | ICD-10-CM | POA: Diagnosis not present

## 2014-12-23 DIAGNOSIS — F411 Generalized anxiety disorder: Secondary | ICD-10-CM | POA: Diagnosis not present

## 2014-12-23 DIAGNOSIS — F900 Attention-deficit hyperactivity disorder, predominantly inattentive type: Secondary | ICD-10-CM | POA: Diagnosis not present

## 2014-12-23 DIAGNOSIS — F330 Major depressive disorder, recurrent, mild: Secondary | ICD-10-CM | POA: Diagnosis not present

## 2014-12-24 DIAGNOSIS — Z8739 Personal history of other diseases of the musculoskeletal system and connective tissue: Secondary | ICD-10-CM | POA: Diagnosis not present

## 2014-12-24 DIAGNOSIS — M797 Fibromyalgia: Secondary | ICD-10-CM | POA: Diagnosis not present

## 2014-12-24 DIAGNOSIS — G894 Chronic pain syndrome: Secondary | ICD-10-CM | POA: Diagnosis not present

## 2014-12-24 DIAGNOSIS — Z0289 Encounter for other administrative examinations: Secondary | ICD-10-CM | POA: Diagnosis not present

## 2015-01-27 ENCOUNTER — Encounter: Payer: Self-pay | Admitting: Family Medicine

## 2015-01-27 ENCOUNTER — Ambulatory Visit (INDEPENDENT_AMBULATORY_CARE_PROVIDER_SITE_OTHER): Payer: Medicare Other | Admitting: Family Medicine

## 2015-01-27 VITALS — BP 116/78 | HR 113 | Temp 99.7°F | Ht 61.0 in | Wt 148.0 lb

## 2015-01-27 DIAGNOSIS — M797 Fibromyalgia: Secondary | ICD-10-CM

## 2015-01-27 DIAGNOSIS — E039 Hypothyroidism, unspecified: Secondary | ICD-10-CM | POA: Diagnosis not present

## 2015-01-27 DIAGNOSIS — F819 Developmental disorder of scholastic skills, unspecified: Secondary | ICD-10-CM | POA: Insufficient documentation

## 2015-01-27 DIAGNOSIS — F909 Attention-deficit hyperactivity disorder, unspecified type: Secondary | ICD-10-CM

## 2015-01-27 DIAGNOSIS — F329 Major depressive disorder, single episode, unspecified: Secondary | ICD-10-CM

## 2015-01-27 DIAGNOSIS — R5383 Other fatigue: Secondary | ICD-10-CM | POA: Insufficient documentation

## 2015-01-27 DIAGNOSIS — F419 Anxiety disorder, unspecified: Secondary | ICD-10-CM | POA: Insufficient documentation

## 2015-01-27 DIAGNOSIS — J42 Unspecified chronic bronchitis: Secondary | ICD-10-CM | POA: Diagnosis not present

## 2015-01-27 DIAGNOSIS — N949 Unspecified condition associated with female genital organs and menstrual cycle: Secondary | ICD-10-CM | POA: Diagnosis not present

## 2015-01-27 DIAGNOSIS — M26629 Arthralgia of temporomandibular joint, unspecified side: Secondary | ICD-10-CM | POA: Diagnosis not present

## 2015-01-27 DIAGNOSIS — R5382 Chronic fatigue, unspecified: Secondary | ICD-10-CM

## 2015-01-27 DIAGNOSIS — F32A Depression, unspecified: Secondary | ICD-10-CM

## 2015-01-27 DIAGNOSIS — R102 Pelvic and perineal pain unspecified side: Secondary | ICD-10-CM

## 2015-01-27 DIAGNOSIS — G8929 Other chronic pain: Secondary | ICD-10-CM | POA: Diagnosis not present

## 2015-01-27 DIAGNOSIS — F988 Other specified behavioral and emotional disorders with onset usually occurring in childhood and adolescence: Secondary | ICD-10-CM

## 2015-01-27 DIAGNOSIS — E079 Disorder of thyroid, unspecified: Secondary | ICD-10-CM | POA: Insufficient documentation

## 2015-01-27 DIAGNOSIS — M069 Rheumatoid arthritis, unspecified: Secondary | ICD-10-CM | POA: Diagnosis not present

## 2015-01-27 HISTORY — DX: Hypothyroidism, unspecified: E03.9

## 2015-01-27 NOTE — Assessment & Plan Note (Signed)
Does not seem to be under good control per PHQ9, possibly due to stopping her thyroid medicine. Continue to monitor. Continue to follow with psychiatry.

## 2015-01-27 NOTE — Assessment & Plan Note (Signed)
Lungs clear today. Will likely need spiro. Will obtain records from previous provider. Continue to monitor.

## 2015-01-27 NOTE — Assessment & Plan Note (Signed)
Just restarted on her medication. Due for repeat TSH in 3 weeks. Will return for lab visit at that time.

## 2015-01-27 NOTE — Assessment & Plan Note (Signed)
Will obtain records. Continue to follow with rheumatology. Continue to monitor.

## 2015-01-27 NOTE — Assessment & Plan Note (Signed)
Monitored by psychiatry. Will obtain records. Continue to monitor.

## 2015-01-27 NOTE — Assessment & Plan Note (Signed)
Has not been using a mouth guard. Will obtain records. Continue to follow with rheumatology. Continue to monitor. Evaluate further next visit.

## 2015-01-27 NOTE — Progress Notes (Signed)
BP 116/78 mmHg  Pulse 113  Temp(Src) 99.7 F (37.6 C)  Ht 5\' 1"  (1.549 m)  Wt 148 lb (67.132 kg)  BMI 27.98 kg/m2  SpO2 97%  LMP 01/06/2015 (Approximate)   Subjective:    Patient ID: Sharon Rodriguez, female    DOB: 16-Jul-1980, 34 y.o.   MRN: [id number]  HPI: [name] is a 34 y.o. female who presents today to establish care  Chief Complaint  Patient presents with  . Pelvic Pain    Patient was told she needed to be seen by a DO for treatment   PELVIC PAIN- started with pain in 2012 when she was at [employer]. Has done PT in the past, couldn't lift her legs. Has seen a chiropractor in the past but feels like she is bad after a couple of day. Saw the ER and they recommended that she see DO for evaluation. Pain comes and goes- feels deep in side, not in the vagina, in the pubic bone Gravida/Para: G2P2- c-sections both times, 2009 Duration: chronic  Severity: fluctuating- gets up to 10/10 Quality: sharp Location: in the pubic bone, and in the back Episode duration: goes away with rest, usually lasts about 10 minutes Frequency: comes and goes- depends, 2 months since it acted up Alleviating factors: chiropractor and sitting down Aggravating factors: lifting, going up stairs, walking a lot  Previous evaluation/studies: x-ray Treatments attempted: PT, chiropractry  Fevers: no Nausea/vomiting: yes, no vomiting Vaginal discharge: yes Dysmenorrhea: yes Dyspareunia: no Weight loss: no Dysuria/urinary frequency: yes to frequency, no burning Hematuria: no Sexual activity: monogamous Contraception: vasectomy   History STDs: no History GYN procedures: c-sections Previous pap smear: 2 years ago, normal  HYPOTHYROIDISM- was off her medication for a couple of months and felt pretty terrible. Just started her medication 3 weeks has been taking the 29mcg Thyroid control status:better Satisfied with current treatment? no Medication side effects: no Medication compliance:  excellent compliance Recent dose adjustment:yes Fatigue: yes Cold intolerance: yes Heat intolerance: no Weight gain: no Weight loss: yes Constipation: no Diarrhea/loose stools: no Palpitations: no Lower extremity edema: no Anxiety/depressed mood: yes- feels dizzy and light   Follows with her psychiatry for ADD- sees them ever 3 months Follows with rhematology for JRA and fibro- sees them every 3 months.   Relevant past medical, surgical, family and social history reviewed and updated as indicated. Interim medical history since our last visit reviewed. Allergies and medications reviewed and updated.  Review of Systems  Constitutional: Positive for activity change, appetite change and fatigue. Negative for fever, chills, diaphoresis and unexpected weight change.  Respiratory: Negative.   Cardiovascular: Negative.   Musculoskeletal: Positive for myalgias, back pain, joint swelling, arthralgias, neck pain and neck stiffness. Negative for gait problem.  Psychiatric/Behavioral: Positive for dysphoric mood and decreased concentration. Negative for suicidal ideas, hallucinations, behavioral problems, confusion, sleep disturbance, self-injury and agitation. The patient is not nervous/anxious and is not hyperactive.     Per HPI unless specifically indicated above     Objective:    BP 116/78 mmHg  Pulse 113  Temp(Src) 99.7 F (37.6 C)  Ht 5\' 1"  (1.549 m)  Wt 148 lb (67.132 kg)  BMI 27.98 kg/m2  SpO2 97%  LMP 01/06/2015 (Approximate)  Wt Readings from Last 3 Encounters:  01/27/15 148 lb (67.132 kg)    Physical Exam  Constitutional: She is oriented to person, place, and time. She appears well-developed and well-nourished. No distress.  HENT:  Head: Normocephalic and atraumatic.  Right  Ear: Hearing normal.  Left Ear: Hearing normal.  Nose: Nose normal.  Eyes: Conjunctivae and lids are normal. Right eye exhibits no discharge. Left eye exhibits no discharge. No scleral icterus.   Cardiovascular: Normal rate, regular rhythm, normal heart sounds and intact distal pulses.  Exam reveals no gallop and no friction rub.   No murmur heard. Pulmonary/Chest: Effort normal and breath sounds normal. No respiratory distress. She has no wheezes. She has no rales. She exhibits no tenderness.  Abdominal: Soft. Bowel sounds are normal. She exhibits no distension and no mass. There is tenderness. There is no rebound and no guarding.  Musculoskeletal: Normal range of motion. She exhibits tenderness. She exhibits no edema.  Tender to palpation of the pubic symphysis  Neurological: She is alert and oriented to person, place, and time.  Skin: Skin is warm, dry and intact. No rash noted. No erythema. No pallor.  Psychiatric: She has a normal mood and affect. Her speech is normal and behavior is normal. Judgment and thought content normal. Cognition and memory are normal.  Nursing note and vitals reviewed.       Assessment & Plan:   Problem List Items Addressed This Visit      Respiratory   Chronic bronchitis ([place])    Lungs clear today. Will likely need spiro. Will obtain records from previous provider. Continue to monitor.         Endocrine   Hypothyroidism    Just restarted on her medication. Due for repeat TSH in 3 weeks. Will return for lab visit at that time.       Relevant Medications   SYNTHROID 50 MCG tablet   Other Relevant Orders   Thyroid Panel With TSH     Musculoskeletal and Integument   Rheumatoid arthritis ([place])    Will obtain records. Continue to follow with rheumatology. Continue to monitor.       Relevant Medications   oxyCODONE-acetaminophen (PERCOCET) 5-325 MG tablet   Fibromyalgia    Will obtain records. Continue to follow with rheumatology. Continue to monitor.       TMJ pain dysfunction syndrome    Has not been using a mouth guard. Will obtain records. Continue to follow with rheumatology. Continue to monitor. Evaluate further next visit.          Other   Anxiety    Does not seem to be under good control per PHQ9, possibly due to stopping her thyroid medicine. Continue to monitor. Continue to follow with psychiatry.       Relevant Medications   ALPRAZolam (XANAX) 1 MG tablet   Depression    Does not seem to be under good control per PHQ9, possibly due to stopping her thyroid medicine. Continue to monitor. Continue to follow with psychiatry.       Relevant Medications   ALPRAZolam (XANAX) 1 MG tablet   Chronic fatigue    Does not seem to be under good control per PHQ9, possibly due to stopping her thyroid medicine. Continue to monitor. Continue to follow with psychiatry.       ADD (attention deficit disorder)    Monitored by psychiatry. Will obtain records. Continue to monitor.       Chronic pelvic pain in female - Primary    Seems to be coming directly from the pubic symphysis. She states that she has had imaging done in the past. She would like to try OMT to see if that could help. We will obtain the records from her previous providers  as well as her specialists and will have her return ASAP for evaluation and possible treatment.       Relevant Medications   oxyCODONE-acetaminophen (PERCOCET) 5-325 MG tablet       Follow up plan: Return ASAP OMM, 3 weeks lab visit for recheck thyroid, for REcords release please.

## 2015-01-27 NOTE — Assessment & Plan Note (Signed)
Seems to be coming directly from the pubic symphysis. She states that she has had imaging done in the past. She would like to try OMT to see if that could help. We will obtain the records from her previous providers as well as her specialists and will have her return ASAP for evaluation and possible treatment.

## 2015-02-01 ENCOUNTER — Ambulatory Visit (INDEPENDENT_AMBULATORY_CARE_PROVIDER_SITE_OTHER): Payer: Medicare Other | Admitting: Family Medicine

## 2015-02-01 ENCOUNTER — Encounter: Payer: Self-pay | Admitting: Family Medicine

## 2015-02-01 VITALS — BP 94/66 | Temp 98.7°F | Wt 153.0 lb

## 2015-02-01 DIAGNOSIS — M2170 Unequal limb length (acquired), unspecified site: Secondary | ICD-10-CM

## 2015-02-01 DIAGNOSIS — M9905 Segmental and somatic dysfunction of pelvic region: Secondary | ICD-10-CM

## 2015-02-01 DIAGNOSIS — M9908 Segmental and somatic dysfunction of rib cage: Secondary | ICD-10-CM | POA: Diagnosis not present

## 2015-02-01 DIAGNOSIS — M9902 Segmental and somatic dysfunction of thoracic region: Secondary | ICD-10-CM

## 2015-02-01 DIAGNOSIS — G8929 Other chronic pain: Secondary | ICD-10-CM

## 2015-02-01 DIAGNOSIS — M9900 Segmental and somatic dysfunction of head region: Secondary | ICD-10-CM

## 2015-02-01 DIAGNOSIS — M9904 Segmental and somatic dysfunction of sacral region: Secondary | ICD-10-CM

## 2015-02-01 DIAGNOSIS — N949 Unspecified condition associated with female genital organs and menstrual cycle: Secondary | ICD-10-CM

## 2015-02-01 DIAGNOSIS — M9903 Segmental and somatic dysfunction of lumbar region: Secondary | ICD-10-CM

## 2015-02-01 DIAGNOSIS — R102 Pelvic and perineal pain: Principal | ICD-10-CM

## 2015-02-01 DIAGNOSIS — M489 Spondylopathy, unspecified: Secondary | ICD-10-CM

## 2015-02-01 NOTE — Assessment & Plan Note (Signed)
This may be due to a leg length discrepancy, but seems to be primarily due to myofascial issues. Will refer to podiatry for eval of potential leg length discrepancy. She does have some somatic dysfunctions that I think are contributing to her symptoms. I think she would benefit from OMT. Patient treated today with good results. Continue to monitor and recheck in 1-2 weeks.

## 2015-02-01 NOTE — Progress Notes (Signed)
BP 94/66 mmHg  Temp(Src) 98.7 F (37.1 C)  Wt 153 lb (69.4 kg)  SpO2 100%  LMP 01/06/2015 (Approximate)   Subjective:    Patient ID: Sharon Rodriguez, female    DOB: 03/29/1980, 34 y.o.   MRN: [id number]  HPI: Sharon Rodriguez is a 34 y.o. female  Chief Complaint  Patient presents with  . Pelvic Pain   PELVIC PAIN- started with pain in 2012 when she was at [employer]. Has done PT in the past, couldn't lift her legs. Has seen a chiropractor in the past but feels like she is bad after a couple of day. Saw the ER and they recommended that she see DO for evaluation. Pain comes and goes- feels deep in side, not in the vagina, in the pubic bone. She has been stable since the last time she was here. No severe pain since the last time she was here, but concerned that it may come back so she wants to be seen to have it controlled. She is interested in doing OMT and has been reading about it. She is very anxious to see if it will help. She does note that she has a cold today, but otherwise has been doing well. She notes that she thinks that her R leg is shorter than her left leg. She states that she was told this when she was a child and hospitalized at [facility] for [place]. She doesn't think she ever had a heel lift, but she had lots of braces as a child, so she is not sure. She has never seen a podiatrist.  Gravida/Para: G2P2- c-sections both times, 2009 Duration: chronic  Severity: fluctuating- gets up to 10/10 Quality: sharp Location: in the pubic bone, and in the back Episode duration: goes away with rest, usually lasts about 10 minutes Frequency: comes and goes- depends, 2.5 months since it acted up Alleviating factors: chiropractor and sitting down Aggravating factors: lifting, going up stairs, walking a lot  Previous evaluation/studies: x-ray Treatments attempted: PT, chiropractry  Fevers: no Nausea/vomiting: yes, no vomiting Vaginal discharge: yes Dysmenorrhea: yes Dyspareunia: no Weight  loss: no Dysuria/urinary frequency: yes to frequency, no burning Hematuria: no Sexual activity: monogamous Contraception: vasectomy  History STDs: no History GYN procedures: c-sections Previous pap smear: 2 years ago, normal Relevant past medical, surgical, family and social history reviewed and updated as indicated. Interim medical history since our last visit reviewed. Allergies and medications reviewed and updated.  Review of Systems  Constitutional: Negative.   Respiratory: Negative.   Cardiovascular: Negative.   Gastrointestinal: Negative.   Genitourinary: Positive for pelvic pain. Negative for dysuria, urgency, hematuria, flank pain, decreased urine volume, vaginal bleeding, vaginal discharge, enuresis, difficulty urinating, genital sores, vaginal pain, menstrual problem and dyspareunia.  Musculoskeletal: Positive for myalgias and back pain. Negative for joint swelling, arthralgias, gait problem, neck pain and neck stiffness.  Psychiatric/Behavioral: Negative.    Per HPI unless specifically indicated above    Objective:    BP 94/66 mmHg  Temp(Src) 98.7 F (37.1 C)  Wt 153 lb (69.4 kg)  SpO2 100%  LMP 01/06/2015 (Approximate)  Wt Readings from Last 3 Encounters:  02/01/15 153 lb (69.4 kg)  01/27/15 148 lb (67.132 kg)    Physical Exam  Constitutional: She is oriented to person, place, and time. She appears well-developed and well-nourished. No distress.  HENT:  Head: Normocephalic and atraumatic.  Right Ear: Hearing normal.  Left Ear: Hearing normal.  Nose: Nose normal.  Eyes: Conjunctivae and lids are  normal. Right eye exhibits no discharge. Left eye exhibits no discharge. No scleral icterus.  Neck: No JVD present. No tracheal deviation present. No thyromegaly present.  Pulmonary/Chest: Effort normal. No stridor. No respiratory distress.  Abdominal: Soft. She exhibits no distension and no mass. There is no tenderness. There is no rebound and no guarding.   Musculoskeletal: She exhibits tenderness. She exhibits no edema.  R leg appears slightly shorter than the L  Lymphadenopathy:    She has no cervical adenopathy.  Neurological: She is alert and oriented to person, place, and time. She has normal reflexes. She displays normal reflexes. No cranial nerve deficit. She exhibits normal muscle tone. Coordination normal.  Skin: Skin is warm, dry and intact. No rash noted. No erythema. No pallor.  Psychiatric: She has a normal mood and affect. Her speech is normal and behavior is normal. Judgment and thought content normal. Cognition and memory are normal.  Nursing note and vitals reviewed. Musculoskeletal:  Exam found Decreased ROM, Tissue texture changes, Tenderness to palpation and Asymmetry of patient's  head, thorax, ribs, lumbar, pelvis and sacrum Osteopathic Structural Exam:   Head: R temporal internally rotated, hypertonic suboccipital muscles, OAESSR, OM suture restricted on the R  Thorax: T3-4SRRL  Ribs: Ribs 6-9 locked down on the R, rib locked out on the L  Lumbar: QL hypertonic bilaterally r>>L, psoas hypertonic on the R  Pelvis: Posterior R innominate, L superior pubic sheer  Sacrum: R on L sacral torsion, SI joint restricted on the R     Assessment & Plan:   Problem List Items Addressed This Visit      Other   Chronic pelvic pain in female - Primary    This may be due to a leg length discrepancy, but seems to be primarily due to myofascial issues. Will refer to podiatry for eval of potential leg length discrepancy. She does have some somatic dysfunctions that I think are contributing to her symptoms. I think she would benefit from OMT. Patient treated today with good results. Continue to monitor and recheck in 1-2 weeks.        Other Visit Diagnoses    Leg length discrepancy        R leg appears short. Possibly the cause of pubic pain. Will refer to podiatry for further eval and possible orthotics. Referral generated today.       Relevant Orders    Ambulatory referral to Podiatry    Cranial somatic dysfunction        Thoracic region somatic dysfunction        Lumbar dysfunction        Sacral region somatic dysfunction        Pelvic region somatic dysfunction        Rib cage region somatic dysfunction         After verbal consent was obtained, patient was treated today with osteopathic manipulative medicine to the regions of the head, thorax, ribs, lumbar, pelvis and sacrum using the techniques of cranial, FPR, myofascial release, counterstrain, muscle energy, HVLA and soft tissue. Areas of compensation relating to her primary pain source also treated. Patient tolerated the procedure well with good objective and good subjective improvement in symptoms. She left the room in good condition. She was advised to stay well hydrated and that she may have some soreness following the procedure. If not improving or worsening, she will call and come in. She will return for reevaluation  in 1-2 weeks.    Follow up plan:  Return 1-2 weeks, for OMT eval.

## 2015-02-03 DIAGNOSIS — J0110 Acute frontal sinusitis, unspecified: Secondary | ICD-10-CM | POA: Diagnosis not present

## 2015-02-08 ENCOUNTER — Ambulatory Visit (INDEPENDENT_AMBULATORY_CARE_PROVIDER_SITE_OTHER): Payer: Medicare Other | Admitting: Family Medicine

## 2015-02-08 ENCOUNTER — Encounter: Payer: Self-pay | Admitting: Family Medicine

## 2015-02-08 VITALS — BP 97/64 | HR 90 | Temp 98.4°F | Ht 61.0 in | Wt 156.0 lb

## 2015-02-08 DIAGNOSIS — M9905 Segmental and somatic dysfunction of pelvic region: Secondary | ICD-10-CM | POA: Diagnosis not present

## 2015-02-08 DIAGNOSIS — M9909 Segmental and somatic dysfunction of abdomen and other regions: Secondary | ICD-10-CM | POA: Diagnosis not present

## 2015-02-08 DIAGNOSIS — M999 Biomechanical lesion, unspecified: Secondary | ICD-10-CM

## 2015-02-08 DIAGNOSIS — N949 Unspecified condition associated with female genital organs and menstrual cycle: Secondary | ICD-10-CM | POA: Diagnosis not present

## 2015-02-08 DIAGNOSIS — M9904 Segmental and somatic dysfunction of sacral region: Secondary | ICD-10-CM

## 2015-02-08 DIAGNOSIS — M9903 Segmental and somatic dysfunction of lumbar region: Secondary | ICD-10-CM

## 2015-02-08 DIAGNOSIS — G8929 Other chronic pain: Secondary | ICD-10-CM | POA: Diagnosis not present

## 2015-02-08 DIAGNOSIS — R102 Pelvic and perineal pain: Principal | ICD-10-CM

## 2015-02-08 DIAGNOSIS — M9906 Segmental and somatic dysfunction of lower extremity: Secondary | ICD-10-CM

## 2015-02-08 DIAGNOSIS — M489 Spondylopathy, unspecified: Secondary | ICD-10-CM

## 2015-02-08 NOTE — Progress Notes (Signed)
BP 97/64 mmHg  Pulse 90  Temp(Src) 98.4 F (36.9 C)  Ht $R'5\' 1"'Ku$  (1.549 m)  Wt 156 lb (70.761 kg)  BMI 29.49 kg/m2  SpO2 100%  LMP 12/26/2014 (Exact Date)   Subjective:    Patient ID: Sharon Rodriguez, female    DOB: 11/09/1980, 34 y.o.   MRN: [id number]  HPI: Sharon Rodriguez is a 34 y.o. female  Chief Complaint  Patient presents with  . Pelvic Pain    OMM   Sharon Rodriguez presents today not doing well. She states that she felt very well following her last appointment, but then she felt like she could do more, so she tried to cook for Thanksgiving and pick up a box of christmas ornaments. She states that this sent her pelvis into a bad reaction and she was unable to walk for 2 days. She has been having a lot of pain in her back that radiates down both legs, but especially the R. Her L pubic bone continues to be the problem. She is feeling very frustrated that she is not better, and wants to know what she can do to feel better. She notes that she is going to see the podiatrist on the 16th. She has otherwise been doing OK with no other concerns or complaints at this time.   Relevant past medical, surgical, family and social history reviewed and updated as indicated. Interim medical history since our last visit reviewed. Allergies and medications reviewed and updated.  Review of Systems  Constitutional: Negative.   Respiratory: Negative.   Cardiovascular: Negative.   Gastrointestinal: Negative.   Musculoskeletal: Positive for myalgias, back pain, arthralgias and gait problem. Negative for joint swelling, neck pain and neck stiffness.  Psychiatric/Behavioral: Negative.     Per HPI unless specifically indicated above     Objective:    BP 97/64 mmHg  Pulse 90  Temp(Src) 98.4 F (36.9 C)  Ht $R'5\' 1"'zP$  (1.549 m)  Wt 156 lb (70.761 kg)  BMI 29.49 kg/m2  SpO2 100%  LMP 12/26/2014 (Exact Date)  Wt Readings from Last 3 Encounters:  02/08/15 156 lb (70.761 kg)  02/01/15 153 lb (69.4 kg)   01/27/15 148 lb (67.132 kg)    Physical Exam  Constitutional: She is oriented to person, place, and time. She appears well-developed and well-nourished. No distress.  HENT:  Head: Normocephalic and atraumatic.  Right Ear: Hearing normal.  Left Ear: Hearing normal.  Nose: Nose normal.  Eyes: Conjunctivae and lids are normal. Right eye exhibits no discharge. Left eye exhibits no discharge. No scleral icterus.  Pulmonary/Chest: Effort normal. No respiratory distress.  Abdominal: Soft. Bowel sounds are normal. She exhibits no distension and no mass. There is no tenderness. There is no rebound and no guarding.  Musculoskeletal: Normal range of motion.  Neurological: She is alert and oriented to person, place, and time.  Skin: Skin is warm, dry and intact. No rash noted. No erythema. No pallor.  Psychiatric: She has a normal mood and affect. Her speech is normal and behavior is normal. Judgment and thought content normal. Cognition and memory are normal.  Nursing note and vitals reviewed. Musculoskeletal:  Exam found Decreased ROM, Tissue texture changes, Tenderness to palpation and Asymmetry of patient's  lumbar, pelvis, sacrum, lower extremity and abdomen Osteopathic Structural Exam:   Lumbar: psoas hypertonic on the R, QL hypertonic on the R  Pelvis: pubic bone restricted on the L, Anterior R innominate  Sacrum: R on R torsion, SI joint restricted on the  R  Lower Extremity: IT hand hypertonic on the R  Abdomen: diaphragm spasm bilaterally R>L   Results for orders placed or performed in visit on 03/20/13  Troponin I  Result Value Ref Range   Troponin-I < 0.02 ng/mL  CBC  Result Value Ref Range   WBC 8.2 3.6-11.0 x10 3/mm 3   RBC 4.77 3.80-5.20 X10 6/mm 3   HGB 13.7 12.0-16.0 g/dL   HCT 39.9 35.0-47.0 %   MCV 84 80-100 fL   MCH 28.7 26.0-34.0 pg   MCHC 34.3 32.0-36.0 g/dL   RDW 13.2 11.5-14.5 %   Platelet 237 150-440 x10 3/mm 3  Basic metabolic panel  Result Value Ref Range    Glucose 102 (H) 65-99 mg/dL   BUN 14 7-18 mg/dL   Creatinine 0.86 0.60-1.30 mg/dL   Sodium 137 136-145 mmol/L   Potassium 3.6 3.5-5.1 mmol/L   Chloride 103 98-107 mmol/L   Co2 27 21-32 mmol/L   Calcium, Total 8.9 8.5-10.1 mg/dL   Osmolality 274 275-301   Anion Gap 7 7-16   EGFR (African American) >60    EGFR (Non-African Amer.) >60   Urinalysis, Complete  Result Value Ref Range   Color - urine Yellow    Clarity - urine Clear    Glucose,UR Negative 0-75 mg/dL   Bilirubin,UR Negative NEGATIVE   Ketone Negative NEGATIVE   Specific Gravity 1.002 1.003-1.030   Blood 3+ NEGATIVE   Ph 6.0 4.5-8.0   Protein Negative NEGATIVE   Nitrite Negative NEGATIVE   Leukocyte Esterase 2+ NEGATIVE   RBC,UR 6 /HPF 0-5 /HPF   WBC UR 7 /HPF 0-5 /HPF   Bacteria TRACE NONE SEEN   Squamous Epithelial 1 /HPF       Assessment & Plan:   Problem List Items Addressed This Visit      Other   Chronic pelvic pain in female - Primary    This may be due to a leg length discrepancy, but seems to be primarily due to myofascial issues. Referred last visit to podiatry for eval of potential leg length discrepancy. She is seeing them on the 16th. Also seems to be due to weak core. Discussed need to strengthen this, and she states that she cannot. Stressed the importance of this and exercises given today. She does have some somatic dysfunctions that I think are contributing to her symptoms. I think she would benefit from OMT. Patient treated today with good results. Continue to monitor and recheck in 2 weeks.          Other Visit Diagnoses    Pelvic region somatic dysfunction        Lumbar dysfunction        Sacral region somatic dysfunction        Somatic dysfunction of lower extremity        Nonallopathic lesion of abdomen          After verbal consent was obtained, patient was treated today with osteopathic manipulative medicine to the regions of the lumbar, pelvis, sacrum, abdomen and lower extremity using  the techniques of Still, myofascial release, counterstrain, muscle energy, HVLA and soft tissue. Areas of compensation relating to her primary pain source also treated. Patient tolerated the procedure well with good objective and fair subjective improvement in symptoms. She left the room in good condition. She was advised to stay well hydrated and that she may have some soreness following the procedure. If not improving or worsening, she will call and come in. Home exercise program  of stretches for lumbar discussed and demonstrated today. Patient will do these stretches BID to before the point of pain, and will return for reevaluation   in 2-3 weeks.   Follow up plan: Return in about 2 weeks (around 02/22/2015).

## 2015-02-08 NOTE — Assessment & Plan Note (Signed)
This may be due to a leg length discrepancy, but seems to be primarily due to myofascial issues. Referred last visit to podiatry for eval of potential leg length discrepancy. She is seeing them on the 16th. Also seems to be due to weak core. Discussed need to strengthen this, and she states that she cannot. Stressed the importance of this and exercises given today. She does have some somatic dysfunctions that I think are contributing to her symptoms. I think she would benefit from OMT. Patient treated today with good results. Continue to monitor and recheck in 2 weeks.

## 2015-02-17 ENCOUNTER — Ambulatory Visit (INDEPENDENT_AMBULATORY_CARE_PROVIDER_SITE_OTHER): Payer: Medicare Other | Admitting: Family Medicine

## 2015-02-17 ENCOUNTER — Encounter: Payer: Self-pay | Admitting: Family Medicine

## 2015-02-17 ENCOUNTER — Other Ambulatory Visit: Payer: Self-pay | Admitting: Family Medicine

## 2015-02-17 ENCOUNTER — Other Ambulatory Visit: Payer: Self-pay

## 2015-02-17 ENCOUNTER — Other Ambulatory Visit: Payer: Medicare Other

## 2015-02-17 VITALS — BP 104/69 | HR 89 | Temp 99.2°F | Ht 61.0 in | Wt 159.0 lb

## 2015-02-17 DIAGNOSIS — R102 Pelvic and perineal pain: Principal | ICD-10-CM

## 2015-02-17 DIAGNOSIS — M9903 Segmental and somatic dysfunction of lumbar region: Secondary | ICD-10-CM | POA: Diagnosis not present

## 2015-02-17 DIAGNOSIS — M9904 Segmental and somatic dysfunction of sacral region: Secondary | ICD-10-CM | POA: Diagnosis not present

## 2015-02-17 DIAGNOSIS — M489 Spondylopathy, unspecified: Secondary | ICD-10-CM

## 2015-02-17 DIAGNOSIS — M9905 Segmental and somatic dysfunction of pelvic region: Secondary | ICD-10-CM | POA: Diagnosis not present

## 2015-02-17 DIAGNOSIS — M9906 Segmental and somatic dysfunction of lower extremity: Secondary | ICD-10-CM

## 2015-02-17 DIAGNOSIS — E039 Hypothyroidism, unspecified: Secondary | ICD-10-CM

## 2015-02-17 DIAGNOSIS — M999 Biomechanical lesion, unspecified: Secondary | ICD-10-CM

## 2015-02-17 DIAGNOSIS — G8929 Other chronic pain: Secondary | ICD-10-CM

## 2015-02-17 DIAGNOSIS — M9909 Segmental and somatic dysfunction of abdomen and other regions: Secondary | ICD-10-CM | POA: Diagnosis not present

## 2015-02-17 DIAGNOSIS — N949 Unspecified condition associated with female genital organs and menstrual cycle: Secondary | ICD-10-CM | POA: Diagnosis not present

## 2015-02-17 NOTE — Assessment & Plan Note (Signed)
This may be due to a leg length discrepancy, but seems to be primarily due to myofascial issues and also likely due to 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 Referred last visit to podiatry for eval of potential leg length discrepancy. She is seeing them on the 16th. Also seems to be due to weak core. Discussed need to strengthen this, and she states that she cannot. Stressed the importance of this and exercises given last visit. Discussed rolling and extension on a towel. Also will look into referral for sclerotherapy to see if decreasing pain from scar tissue would help. She does have some somatic dysfunctions that I think are contributing to her symptoms. I think she would benefit from OMT. Patient treated today with good results. Continue to monitor and recheck in 1-2 weeks.

## 2015-02-17 NOTE — Progress Notes (Signed)
BP 104/69 mmHg  Pulse 89  Temp(Src) 99.2 F (37.3 C)  Ht 5\' 1"  (1.549 m)  Wt 159 lb (72.122 kg)  BMI 30.06 kg/m2  SpO2 97%  LMP 01/27/2015 (Approximate)   Subjective:    Patient ID: Sharon Rodriguez, female    DOB: 1980/12/30, 34 y.o.   MRN: [id number]  HPI: Sharon Rodriguez is a 34 y.o. female  Chief Complaint  Patient presents with  . Pelvic Pain    OMM   Not doing well today. She notes that she tried to go Christmas shopping with her husband yesterday and felt like her pubis "went out" and she had to drag her leg. She did well after her last appointment, but feels like something always seems to get her out of place again and she ends up feeling bad again. She has not seen the podiatrist yet, but is due to see him on the 15th. She also notes that she cancelled an injection with a specialist for her pubis, because she wanted to try OMT first. She is feeling a little frustrated because she wants to feel better faster and as of yet, she feels better immediately after her treatment, and then starts to feel worse when something "moves" with basic ADLS. Her pain is over her pubis and radiates back into her R SI joint. She notes no numbness or tingling, but a lot of pain and weakness and feels like she has to drag her leg. 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 No records have come in from her previous providers as of yet.   Relevant past medical, surgical, family and social history reviewed and updated as indicated. Interim medical history since our last visit reviewed. Allergies and medications reviewed and updated.  Review of Systems  Constitutional: Negative.   Respiratory: Negative.   Cardiovascular: Negative.   Genitourinary: Positive for pelvic pain.  Musculoskeletal: Positive for myalgias, back pain, joint swelling, arthralgias, gait problem, neck pain and neck stiffness.   Psychiatric/Behavioral: Negative.     Per HPI unless specifically indicated above     Objective:    BP 104/69 mmHg  Pulse 89  Temp(Src) 99.2 F (37.3 C)  Ht 5\' 1"  (1.549 m)  Wt 159 lb (72.122 kg)  BMI 30.06 kg/m2  SpO2 97%  LMP 01/27/2015 (Approximate)  Wt Readings from Last 3 Encounters:  02/17/15 159 lb (72.122 kg)  02/08/15 156 lb (70.761 kg)  02/01/15 153 lb (69.4 kg)    Physical Exam  Constitutional: She is oriented to person, place, and time. She appears well-developed and well-nourished. No distress.  HENT:  Head: Normocephalic and atraumatic.  Right Ear: Hearing normal.  Left Ear: Hearing normal.  Nose: Nose normal.  Eyes: Conjunctivae and lids are normal. Right eye exhibits no discharge. Left eye exhibits no discharge. No scleral icterus.  Pulmonary/Chest: Effort normal. No respiratory distress.  Abdominal: Soft. Bowel sounds are normal. She exhibits no distension and no mass. There is no tenderness. There is no rebound and no guarding.  Neurological: She is alert and oriented to person, place, and time.  Skin: Skin is warm, dry and intact. No rash noted. No erythema. No pallor.  Psychiatric: She has a normal mood and affect. Her speech is normal and behavior is normal. Judgment and thought content normal. Cognition and memory are normal.  Nursing note and vitals reviewed. Musculoskeletal:  Exam found  Decreased ROM, Tissue texture changes, Tenderness to palpation and Asymmetry of patient's  lumbar, pelvis, sacrum, lower extremity and abdomen Osteopathic Structural Exam:   Lumbar: psoas spasm on the R, [id number]  Pelvis: Posterior R innominate, superior L pubic sheer  Sacrum: L on L torsion, SI joint restricted on the R with increased tone  Lower Extremity: It band hypertonic on the R, fascial strain from R ankle into pubis and into abdomen  Abdomen: fascial strain from c-section scar into pubic bone bilaterally and into R SI joint     Assessment & Plan:    Problem List Items Addressed This Visit      Other   Chronic pelvic pain in female - Primary    This may be due to a leg length discrepancy, but seems to be primarily due to myofascial issues and also likely due to 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 Referred last visit to podiatry for eval of potential leg length discrepancy. She is seeing them on the 16th. Also seems to be due to weak core. Discussed need to strengthen this, and she states that she cannot. Stressed the importance of this and exercises given last visit. Discussed rolling and extension on a towel. Also will look into referral for sclerotherapy to see if decreasing pain from scar tissue would help. She does have some somatic dysfunctions that I think are contributing to her symptoms. I think she would benefit from OMT. Patient treated today with good results. Continue to monitor and recheck in 1-2 weeks.       Relevant Medications   naproxen (NAPROSYN) 500 MG tablet    Other Visit Diagnoses    Pelvic region somatic dysfunction        Lumbar dysfunction        Sacral region somatic dysfunction        Somatic dysfunction of lower extremity        Nonallopathic lesion of abdomen           After verbal consent was obtained, patient was treated today with osteopathic manipulative medicine to the regions of the lumbar, pelvis, sacrum, abdomen and lower extremity using the techniques of Still, FPR, myofascial release, counterstrain, muscle energy and soft tissue. Areas of compensation relating to her primary pain source also treated. Patient tolerated the procedure well with fair objective and fair subjective improvement in symptoms. She left the room in good condition. She was advised to stay well hydrated and that she may have some soreness following the procedure. If not improving or worsening, she will call and come in. She will return for reevaluation  in 1-2 weeks.   Follow up plan: Return in about 1 week (around  02/24/2015).

## 2015-02-18 LAB — THYROID PANEL WITH TSH
Free Thyroxine Index: 2.1 (ref 1.2–4.9)
T3 Uptake Ratio: 27 % (ref 24–39)
T4, Total: 7.9 ug/dL (ref 4.5–12.0)
TSH: 2.16 u[IU]/mL (ref 0.450–4.500)

## 2015-02-19 ENCOUNTER — Telehealth: Payer: Self-pay | Admitting: Family Medicine

## 2015-02-19 MED ORDER — SYNTHROID 50 MCG PO TABS: 50.0000 ug | ORAL_TABLET | Freq: Every day | ORAL | Status: DC

## 2015-02-19 NOTE — Telephone Encounter (Signed)
Please let her know that her thyroid came back normal and I sent her medicine for a year to her pharmacy.

## 2015-02-19 NOTE — Telephone Encounter (Signed)
No answer. No messaging service. Will try again later.

## 2015-02-23 NOTE — Telephone Encounter (Signed)
Patient notified

## 2015-02-25 ENCOUNTER — Encounter: Payer: Self-pay | Admitting: Family Medicine

## 2015-02-25 ENCOUNTER — Ambulatory Visit (INDEPENDENT_AMBULATORY_CARE_PROVIDER_SITE_OTHER): Payer: Medicare Other | Admitting: Podiatry

## 2015-02-25 ENCOUNTER — Encounter: Payer: Self-pay | Admitting: Podiatry

## 2015-02-25 ENCOUNTER — Ambulatory Visit (INDEPENDENT_AMBULATORY_CARE_PROVIDER_SITE_OTHER): Payer: Medicare Other | Admitting: Family Medicine

## 2015-02-25 VITALS — BP 88/62 | HR 79 | Resp 18

## 2015-02-25 VITALS — BP 94/60 | HR 77 | Temp 98.8°F | Ht 61.0 in | Wt 158.8 lb

## 2015-02-25 DIAGNOSIS — G8929 Other chronic pain: Secondary | ICD-10-CM

## 2015-02-25 DIAGNOSIS — M9904 Segmental and somatic dysfunction of sacral region: Secondary | ICD-10-CM | POA: Diagnosis not present

## 2015-02-25 DIAGNOSIS — M2170 Unequal limb length (acquired), unspecified site: Secondary | ICD-10-CM | POA: Diagnosis not present

## 2015-02-25 DIAGNOSIS — M9909 Segmental and somatic dysfunction of abdomen and other regions: Secondary | ICD-10-CM | POA: Diagnosis not present

## 2015-02-25 DIAGNOSIS — M9903 Segmental and somatic dysfunction of lumbar region: Secondary | ICD-10-CM | POA: Diagnosis not present

## 2015-02-25 DIAGNOSIS — N949 Unspecified condition associated with female genital organs and menstrual cycle: Secondary | ICD-10-CM | POA: Diagnosis not present

## 2015-02-25 DIAGNOSIS — M489 Spondylopathy, unspecified: Secondary | ICD-10-CM

## 2015-02-25 DIAGNOSIS — M9905 Segmental and somatic dysfunction of pelvic region: Secondary | ICD-10-CM | POA: Diagnosis not present

## 2015-02-25 DIAGNOSIS — M9908 Segmental and somatic dysfunction of rib cage: Secondary | ICD-10-CM | POA: Diagnosis not present

## 2015-02-25 DIAGNOSIS — M9902 Segmental and somatic dysfunction of thoracic region: Secondary | ICD-10-CM

## 2015-02-25 DIAGNOSIS — R102 Pelvic and perineal pain: Principal | ICD-10-CM

## 2015-02-25 DIAGNOSIS — M999 Biomechanical lesion, unspecified: Secondary | ICD-10-CM

## 2015-02-25 NOTE — Progress Notes (Signed)
   Subjective:    Patient ID: Sharon Rodriguez, female    DOB: June 17, 1980, 34 y.o.   MRN: [id number]  HPI  34 year old female presents the office today for concerns of the limb length discrepancy. She states that she's had some knee and hip pain but denies any pain to her feet. Denies any swelling or redness. No tingling or numbness. No other complaints at this time.  Review of Systems  All other systems reviewed and are negative.      Objective:   Physical Exam General: AAO x3, NAD  Dermatological: Skin is warm, dry and supple bilateral. Nails x 10 are well manicured; remaining integument appears unremarkable at this time. There are no open sores, no preulcerative lesions, no rash or signs of infection present.  Vascular: Dorsalis Pedis artery and Posterior Tibial artery pedal pulses are 2/4 bilateral with immedate capillary fill time. Pedal hair growth present. No varicosities and no lower extremity edema present bilateral. There is no pain with calf compression, swelling, warmth, erythema.   Neruologic: Grossly intact via light touch bilateral. Vibratory intact via tuning fork bilateral. Protective threshold with Semmes Wienstein monofilament intact to all pedal sites bilateral. Patellar and Achilles deep tendon reflexes 2+ bilateral. No Babinski or clonus noted bilateral.   Musculoskeletal: There does of intravenous slight limited discrepancy with the left side approximately 0.5 synovator shorter than the right side. I measured the umbilicus to the medial malleolus on the left side was 83 on the right a 83.5. No gross boney pedal deformities bilateral. No pain, crepitus, or limitation noted with foot and ankle range of motion bilateral. Muscular strength 5/5 in all groups tested bilateral.  Gait: Unassisted, Nonantalgic.        Assessment & Plan:  34 year old female with mild limb length discrepancy -Treatment options discussed including all alternatives, risks, and  complications -Dispensed heel lift to applied to the left side. She has tried a lift on the right side that she is aggravated the symptoms worse and this is likely because the left side is somewhat shorter. I discussed with her custom insert were shoe but for now we'll start with the heel lift to see what height helps her symptoms. -Follow-up in the near future. Call if questions or concerns. Follow up with orthopedics for hip or knee pain.  Celesta Gentile, DPM

## 2015-02-25 NOTE — Assessment & Plan Note (Signed)
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 Saw podiatry today and started on lift. Doing better!  Also seems to be due to weak core. Discussed need to strengthen this, and she states that she cannot. Stressed the importance of this and exercises given last visit. Discussed rolling and extension on a towel. Also will look into referral for sclerotherapy to see if decreasing pain from scar tissue would help. She does have some somatic dysfunctions that I think are contributing to her symptoms. I think she would benefit from OMT. Patient treated today with good results. Continue to monitor and recheck in 2-3 weeks.

## 2015-02-25 NOTE — Progress Notes (Signed)
BP 94/60 mmHg  Pulse 77  Temp(Src) 98.8 F (37.1 C)  Ht 5\' 1"  (1.549 m)  Wt 158 lb 12.8 oz (72.031 kg)  BMI 30.02 kg/m2  SpO2 98%  LMP 01/27/2015 (Approximate)   Subjective:    Patient ID: Sharon Rodriguez, female    DOB: March 25, 1980, 34 y.o.   MRN: [id number]  HPI: Sharon Rodriguez is a 34 y.o. female  Chief Complaint  Patient presents with  . Pelvic Pain    OMM   She states that she is doing well. Saw the podiatrist today and they thought that her short leg was the L one. Got her in a lift and she is to be following up with them in 6 weeks. She doesn't feel like her pelvis has gone out again, so she feels like she is doing better. Feels like she has some pain in her upper back today which is feeling tight, but her pelvis is much better than it has been. She still feels tight, which is chronic. Pain is mild today. Better with OMT, worse with a lot of walking, especially stairs. No other signs or symptoms.  She is otherwise feeling well with no other concerns or complaints at this time.   Relevant past medical, surgical, family and social history reviewed and updated as indicated. Interim medical history since our last visit reviewed. Allergies and medications reviewed and updated.  Review of Systems  Constitutional: Negative.   Respiratory: Negative.   Cardiovascular: Negative.   Genitourinary: Positive for pelvic pain.  Musculoskeletal: Positive for myalgias, back pain, arthralgias and gait problem. Negative for joint swelling, neck pain and neck stiffness.  Psychiatric/Behavioral: Negative.     Per HPI unless specifically indicated above     Objective:    BP 94/60 mmHg  Pulse 77  Temp(Src) 98.8 F (37.1 C)  Ht 5\' 1"  (1.549 m)  Wt 158 lb 12.8 oz (72.031 kg)  BMI 30.02 kg/m2  SpO2 98%  LMP 01/27/2015 (Approximate)  Wt Readings from Last 3 Encounters:  02/25/15 158 lb 12.8 oz (72.031 kg)  02/17/15 159 lb (72.122 kg)  02/08/15 156 lb (70.761 kg)    Physical Exam   Constitutional: She is oriented to person, place, and time. She appears well-developed and well-nourished. No distress.  HENT:  Head: Normocephalic and atraumatic.  Right Ear: Hearing normal.  Left Ear: Hearing normal.  Nose: Nose normal.  Eyes: Conjunctivae and lids are normal. Right eye exhibits no discharge. Left eye exhibits no discharge. No scleral icterus.  Neck: Normal range of motion. Neck supple. No JVD present. No tracheal deviation present. No thyromegaly present.  Pulmonary/Chest: Effort normal. No stridor. No respiratory distress.  Abdominal: Soft. She exhibits no distension and no mass. There is no tenderness. There is no rebound and no guarding.  Musculoskeletal: Normal range of motion.  Lymphadenopathy:    She has no cervical adenopathy.  Neurological: She is alert and oriented to person, place, and time.  Skin: Skin is warm, dry and intact. No rash noted.  Psychiatric: She has a normal mood and affect. Her speech is normal and behavior is normal. Judgment and thought content normal. Cognition and memory are normal.   Musculoskeletal:  Exam found Decreased ROM, Tissue texture changes, Tenderness to palpation and Asymmetry of patient's  thorax, ribs, lumbar, pelvis, sacrum and abdomen Osteopathic Structural Exam:   Thorax: T3-T6SLRR, trap spasm bilaterally  Ribs: Ribs 5-7locked up on the L  Lumbar: QL hypertonic on the L, psoas spasm on the  R, L1ESRL  Pelvis: Anterior L innominate, slight pubic sheer up on the R  Sacrum: SI joint restricted on the R, R on R torsion  Abdomen: diaphragm spasm on the L   Results for orders placed or performed in visit on 02/17/15  Thyroid Panel With TSH  Result Value Ref Range   TSH 2.160 0.450 - 4.500 uIU/mL   T4, Total 7.9 4.5 - 12.0 ug/dL   T3 Uptake Ratio 27 24 - 39 %   Free Thyroxine Index 2.1 1.2 - 4.9      Assessment & Plan:   Problem List Items Addressed This Visit      Other   Chronic pelvic pain in female - Primary     This may be due to a leg length discrepancy, but seems to be primarily due to myofascial issues and also likely due 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 Saw podiatry today and started on lift. Doing better!  Also seems to be due to weak core. Discussed need to strengthen this, and she states that she cannot. Stressed the importance of this and exercises given last visit. Discussed rolling and extension on a towel. Also will look into referral for sclerotherapy to see if decreasing pain from scar tissue would help. She does have some somatic dysfunctions that I think are contributing to her symptoms. I think she would benefit from OMT. Patient treated today with good results. Continue to monitor and recheck in 2-3 weeks.         Other Visit Diagnoses    Nonallopathic lesion of abdomen        Pelvic region somatic dysfunction        Lumbar dysfunction        Sacral region somatic dysfunction        Thoracic region somatic dysfunction        Rib cage region somatic dysfunction          After verbal consent was obtained, patient was treated today with osteopathic manipulative medicine to the regions of the thorax, ribs, lumbar, pelvis, sacrum and abdomen using the techniques of myofascial release, counterstrain, muscle energy, HVLA and soft tissue. Areas of compensation relating to her primary pain source also treated. Patient tolerated the procedure well with good objective and good subjective improvement in symptoms. She left the room in good condition. She was advised to stay well hydrated and that she may have some soreness following the procedure. If not improving or worsening, she will call and come in. She will return for reevaluation   in 2-3 weeks.   Follow up plan: Return After Christmas , for OMM eval.

## 2015-02-27 DIAGNOSIS — M2170 Unequal limb length (acquired), unspecified site: Secondary | ICD-10-CM | POA: Insufficient documentation

## 2015-03-11 ENCOUNTER — Ambulatory Visit (INDEPENDENT_AMBULATORY_CARE_PROVIDER_SITE_OTHER): Payer: Medicare Other | Admitting: Family Medicine

## 2015-03-11 ENCOUNTER — Encounter: Payer: Self-pay | Admitting: Family Medicine

## 2015-03-11 VITALS — BP 110/68 | HR 76 | Temp 98.2°F | Ht 61.6 in | Wt 157.0 lb

## 2015-03-11 DIAGNOSIS — M999 Biomechanical lesion, unspecified: Secondary | ICD-10-CM

## 2015-03-11 DIAGNOSIS — N949 Unspecified condition associated with female genital organs and menstrual cycle: Secondary | ICD-10-CM | POA: Diagnosis not present

## 2015-03-11 DIAGNOSIS — M9906 Segmental and somatic dysfunction of lower extremity: Secondary | ICD-10-CM

## 2015-03-11 DIAGNOSIS — M9909 Segmental and somatic dysfunction of abdomen and other regions: Secondary | ICD-10-CM | POA: Diagnosis not present

## 2015-03-11 DIAGNOSIS — R102 Pelvic and perineal pain: Principal | ICD-10-CM

## 2015-03-11 DIAGNOSIS — G8929 Other chronic pain: Secondary | ICD-10-CM | POA: Diagnosis not present

## 2015-03-11 DIAGNOSIS — M9904 Segmental and somatic dysfunction of sacral region: Secondary | ICD-10-CM | POA: Diagnosis not present

## 2015-03-11 DIAGNOSIS — M9908 Segmental and somatic dysfunction of rib cage: Secondary | ICD-10-CM | POA: Diagnosis not present

## 2015-03-11 DIAGNOSIS — M9905 Segmental and somatic dysfunction of pelvic region: Secondary | ICD-10-CM

## 2015-03-11 NOTE — Progress Notes (Signed)
BP 110/68 mmHg  Pulse 76  Temp(Src) 98.2 F (36.8 C)  Ht 5' 1.6" (1.565 m)  Wt 157 lb (71.215 kg)  BMI 29.08 kg/m2  SpO2 99%  LMP 03/01/2015 (Approximate)   Subjective:    Patient ID: Sharon Rodriguez, female    DOB: 28-Oct-1980, 34 y.o.   MRN: [id number]  HPI: Sharon Rodriguez is a 34 y.o. female  Chief Complaint  Patient presents with  . Pelvic Pain    OMM   [name] notes that she gets better and worse. She did well following her last appointment and felt better for several days up until about 2-3 days ago when she states that she "couldn't walk" again. She feels like the heel lift seems like it's helping. She notes that her pubic bone and R SI joint hurt severely, feels sharp and makes her feel weak. Pain radiates into R SI joint. Chronic, but has been doing better with treatment. No other signs and symptoms. Worse when she walks for long periods of time, better when she has OMT. She thinks OMT helps and she would like to continue with treatment. No other concerns or complaints at this time.   Relevant past medical, surgical, family and social history reviewed and updated as indicated. Interim medical history since our last visit reviewed. Allergies and medications reviewed and updated.  Review of Systems  Constitutional: Negative.   Respiratory: Negative.   Cardiovascular: Negative.   Musculoskeletal: Positive for myalgias, joint swelling and gait problem. Negative for back pain, arthralgias, neck pain and neck stiffness.  Psychiatric/Behavioral: Negative.     Per HPI unless specifically indicated above     Objective:    BP 110/68 mmHg  Pulse 76  Temp(Src) 98.2 F (36.8 C)  Ht 5' 1.6" (1.565 m)  Wt 157 lb (71.215 kg)  BMI 29.08 kg/m2  SpO2 99%  LMP 03/01/2015 (Approximate)  Wt Readings from Last 3 Encounters:  03/11/15 157 lb (71.215 kg)  02/25/15 158 lb 12.8 oz (72.031 kg)  02/17/15 159 lb (72.122 kg)    Physical Exam  Constitutional: She is oriented to  person, place, and time. She appears well-developed and well-nourished. No distress.  HENT:  Head: Normocephalic and atraumatic.  Right Ear: Hearing and external ear normal.  Left Ear: Hearing and external ear normal.  Nose: Nose normal.  Mouth/Throat: Oropharynx is clear and moist. No oropharyngeal exudate.  Eyes: Conjunctivae and lids are normal. Right eye exhibits no discharge. Left eye exhibits no discharge. No scleral icterus.  Neck: Neck supple. No JVD present. No tracheal deviation present. No thyromegaly present.  Pulmonary/Chest: Effort normal. No stridor. No respiratory distress.  Abdominal: Bowel sounds are normal. She exhibits no distension and no mass. There is no tenderness. There is no rebound and no guarding.  Lymphadenopathy:    She has no cervical adenopathy.  Neurological: She is alert and oriented to person, place, and time.  Skin: Skin is warm, dry and intact. No rash noted. She is not diaphoretic. No erythema. No pallor.  Psychiatric: She has a normal mood and affect. Her speech is normal and behavior is normal. Judgment and thought content normal. Cognition and memory are normal.  Nursing note and vitals reviewed. Musculoskeletal:  Exam found Decreased ROM, Tissue texture changes, Tenderness to palpation and Asymmetry of patient's  ribs, pelvis, sacrum, lower extremity and abdomen Osteopathic Structural Exam:   Ribs: Ribs 6-9 locked up on the R, Ribs 4-6 locked up on the L  Pelvis: Posterior R innominate,  SI joint restricted on the R, pubic bone restricted on the R  Sacrum: L on L sacral torsion, SI joint restricted on the R, S3 interosseous strain on the R  Lower Extremity: IT band hypertonic on the R  Abdomen: pelvic diaphragm restricted, fascial strain from c-section scar into R SI joint    Results for orders placed or performed in visit on 02/17/15  Thyroid Panel With TSH  Result Value Ref Range   TSH 2.160 0.450 - 4.500 uIU/mL   T4, Total 7.9 4.5 - 12.0 ug/dL    T3 Uptake Ratio 27 24 - 39 %   Free Thyroxine Index 2.1 1.2 - 4.9      Assessment & Plan:   Problem List Items Addressed This Visit      Other   Chronic pelvic pain in female - Primary    This may be due to a leg length discrepancy, but seems to be primarily due to myofascial issues and also likely due 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 Saw podiatry prior to last visit and started on lift. Doing better!  Also seems to be due to weak core. Discussed need to strengthen this, and she states that she cannot. Also will look into referral for sclerotherapy to see if decreasing pain from scar tissue would help. She does have some somatic dysfunctions that I think are contributing to her symptoms. I think she would benefit from OMT. Patient treated today with good results. Continue to monitor and recheck in 2-3 weeks.        Other Visit Diagnoses    Nonallopathic lesion of abdomen        Pelvic region somatic dysfunction        Sacral region somatic dysfunction        Somatic dysfunction of lower extremity        Rib cage region somatic dysfunction          After verbal consent was obtained, patient was treated today with osteopathic manipulative medicine to the regions of the ribs, pelvis, sacrum, abdomen and lower extremity using the techniques of myofascial release, counterstrain, muscle energy, HVLA and soft tissue. Areas of compensation relating to her primary pain source also treated. Patient tolerated the procedure well with good objective and good subjective improvement in symptoms. She left the room in good condition. She was advised to stay well hydrated and that she may have some soreness following the procedure. If not improving or worsening, she will call and come in.  She will return for reevaluation   in 2-3 weeks.   Follow up plan: Return 2-3 weeks.

## 2015-03-14 NOTE — Assessment & Plan Note (Signed)
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 Saw podiatry prior to last visit and started on lift. Doing better!  Also seems to be due to weak core. Discussed need to strengthen this, and she states that she cannot. Also will look into referral for sclerotherapy to see if decreasing pain from scar tissue would help. She does have some somatic dysfunctions that I think are contributing to her symptoms. I think she would benefit from OMT. Patient treated today with good results. Continue to monitor and recheck in 2-3 weeks.

## 2015-03-25 DIAGNOSIS — Z79899 Other long term (current) drug therapy: Secondary | ICD-10-CM | POA: Diagnosis not present

## 2015-03-25 DIAGNOSIS — F330 Major depressive disorder, recurrent, mild: Secondary | ICD-10-CM | POA: Diagnosis not present

## 2015-03-25 DIAGNOSIS — F411 Generalized anxiety disorder: Secondary | ICD-10-CM | POA: Diagnosis not present

## 2015-03-25 DIAGNOSIS — F900 Attention-deficit hyperactivity disorder, predominantly inattentive type: Secondary | ICD-10-CM | POA: Diagnosis not present

## 2015-03-26 ENCOUNTER — Encounter: Payer: Self-pay | Admitting: Family Medicine

## 2015-03-26 ENCOUNTER — Ambulatory Visit (INDEPENDENT_AMBULATORY_CARE_PROVIDER_SITE_OTHER): Payer: Medicare Other | Admitting: Family Medicine

## 2015-03-26 VITALS — BP 99/68 | HR 85 | Temp 99.1°F | Ht 61.6 in | Wt 155.0 lb

## 2015-03-26 DIAGNOSIS — M9903 Segmental and somatic dysfunction of lumbar region: Secondary | ICD-10-CM

## 2015-03-26 DIAGNOSIS — G8929 Other chronic pain: Secondary | ICD-10-CM | POA: Diagnosis not present

## 2015-03-26 DIAGNOSIS — M9902 Segmental and somatic dysfunction of thoracic region: Secondary | ICD-10-CM

## 2015-03-26 DIAGNOSIS — R102 Pelvic and perineal pain: Principal | ICD-10-CM

## 2015-03-26 DIAGNOSIS — M9908 Segmental and somatic dysfunction of rib cage: Secondary | ICD-10-CM | POA: Diagnosis not present

## 2015-03-26 DIAGNOSIS — M9909 Segmental and somatic dysfunction of abdomen and other regions: Secondary | ICD-10-CM

## 2015-03-26 DIAGNOSIS — M9906 Segmental and somatic dysfunction of lower extremity: Secondary | ICD-10-CM

## 2015-03-26 DIAGNOSIS — M9905 Segmental and somatic dysfunction of pelvic region: Secondary | ICD-10-CM

## 2015-03-26 DIAGNOSIS — M9904 Segmental and somatic dysfunction of sacral region: Secondary | ICD-10-CM

## 2015-03-26 DIAGNOSIS — N949 Unspecified condition associated with female genital organs and menstrual cycle: Secondary | ICD-10-CM

## 2015-03-26 DIAGNOSIS — M999 Biomechanical lesion, unspecified: Secondary | ICD-10-CM

## 2015-03-26 DIAGNOSIS — M9901 Segmental and somatic dysfunction of cervical region: Secondary | ICD-10-CM | POA: Diagnosis not present

## 2015-03-26 DIAGNOSIS — M9900 Segmental and somatic dysfunction of head region: Secondary | ICD-10-CM

## 2015-03-26 DIAGNOSIS — M489 Spondylopathy, unspecified: Secondary | ICD-10-CM

## 2015-03-26 NOTE — Progress Notes (Signed)
BP 99/68 mmHg  Pulse 85  Temp(Src) 99.1 F (37.3 C)  Ht 5' 1.6" (1.565 m)  Wt 155 lb (70.308 kg)  BMI 28.71 kg/m2  SpO2 99%  LMP 03/01/2015 (Approximate)   Subjective:    Patient ID: Sharon Rodriguez, female    DOB: 05/02/1980, 35 y.o.   MRN: [id number]  HPI: Sharon Rodriguez is a 35 y.o. female  Chief Complaint  Patient presents with  . OMM   Daleiza states that she is doing OK. Felt better for about a week or so after her last appointment and her pelvis has been feeling much better since getting her heel lift. She thinks that it has really been helping and calming things down. Still having a problem with a lot of walking. She notes that today, her hips and R knee have really been acting up as well as her upper back. She has been stressed with her kids being home in the snow and with the cold. She states that her pain is aching and burning in nature. Radiating from her hip into her R knee. She notes that it's better with OMT and heat and worse with cold and walking. No numbness or tingling. Otherwise feeling well with no other concerns or complaints at this time.   Relevant past medical, surgical, family and social history reviewed and updated as indicated. Interim medical history since our last visit reviewed. Allergies and medications reviewed and updated.  Review of Systems  Constitutional: Negative.   HENT: Negative.   Respiratory: Negative.   Cardiovascular: Negative.   Musculoskeletal: Positive for myalgias, back pain, arthralgias, gait problem, neck pain and neck stiffness. Negative for joint swelling.  Skin: Negative.   Neurological: Negative.   Psychiatric/Behavioral: Negative.    Per HPI unless specifically indicated above     Objective:    BP 99/68 mmHg  Pulse 85  Temp(Src) 99.1 F (37.3 C)  Ht 5' 1.6" (1.565 m)  Wt 155 lb (70.308 kg)  BMI 28.71 kg/m2  SpO2 99%  LMP 03/01/2015 (Approximate)  Wt Readings from Last 3 Encounters:  03/26/15 155 lb (70.308 kg)   03/11/15 157 lb (71.215 kg)  02/25/15 158 lb 12.8 oz (72.031 kg)    Physical Exam  Constitutional: She is oriented to person, place, and time. She appears well-developed and well-nourished. No distress.  HENT:  Head: Normocephalic and atraumatic.  Right Ear: Hearing normal.  Left Ear: Hearing normal.  Nose: Nose normal.  Eyes: Conjunctivae and lids are normal. Right eye exhibits no discharge. Left eye exhibits no discharge. No scleral icterus.  Neck: Neck supple. No JVD present. No tracheal deviation present. No thyromegaly present.  Pulmonary/Chest: Effort normal. No stridor. No respiratory distress.  Abdominal: Soft. She exhibits no distension and no mass. There is no tenderness. There is no rebound and no guarding.  Lymphadenopathy:    She has no cervical adenopathy.  Neurological: She is alert and oriented to person, place, and time.  Skin: Skin is warm, dry and intact. No rash noted. No erythema. No pallor.  Psychiatric: She has a normal mood and affect. Her speech is normal and behavior is normal. Judgment and thought content normal. Cognition and memory are normal.  Nursing note and vitals reviewed. Musculoskeletal:  Exam found Decreased ROM, Tissue texture changes, Tenderness to palpation and Asymmetry of patient's  head, neck, thorax, ribs, lumbar, pelvis, sacrum, lower extremity and abdomen Osteopathic Structural Exam:   Head: Hypertonic suboccipital muscles, R>L, OAESSR, OM suture restricted on the R  Neck: posterior scalenes hypertonic bilaterally, C5ESRR  Thorax: T3-5SLRR, upper trap spasm bilaterally R>L  Ribs: Ribs 6-8 locked up on the L  Lumbar: QL hypertonic on the R   Pelvis: bilateral outflare.   Sacrum: R on L torsion  Lower Extremity: IT band hypertonic bilaterally L>R  Abdomen: pelvic diaphragm restricted on the L with fascial strain into R leg and R sacrum.     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 Saw podiatry prior to last visit and started on lift. Doing better!  Also seems to be due to weak core. Discussed need to strengthen this, and she states that she cannot. Also will look into referral for sclerotherapy to see if decreasing pain from scar tissue would help. She does have some somatic dysfunctions that I think are contributing to her symptoms. I think she would benefit from OMT. Patient treated today with good results. Continue to monitor and recheck in 2-3 weeks.       Other Visit Diagnoses    Nonallopathic lesion of abdomen        Pelvic region somatic dysfunction        Sacral region somatic dysfunction        Somatic dysfunction of lower extremity        Thoracic region somatic dysfunction        Lumbar dysfunction        Rib cage region somatic dysfunction        Cranial somatic dysfunction        Somatic dysfunction of cervical region         After verbal consent was obtained, patient was treated today with osteopathic manipulative medicine to the regions of the head, neck, thorax, ribs, lumbar, pelvis, sacrum, abdomen and lower extremity using the techniques of cranial, FPR, myofascial release, counterstrain, muscle energy, HVLA and soft tissue. Areas of compensation relating to her primary pain source also treated. Patient tolerated the procedure well with good objective and good subjective improvement in symptoms. She left the room in good condition. She was advised to stay well hydrated and that she may have some soreness following the procedure. If  not improving or worsening, she will call and come in. Home exercise program of stretches for IT bands discussed and demonstrated today. Patient will do these stretches BID to before the point of pain, and will return for reevaluation   in 2-3 weeks.    Follow up plan: Return 2-3 weeks, for OMM.

## 2015-03-26 NOTE — Assessment & Plan Note (Signed)
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 Saw podiatry prior to last visit and started on lift. Doing better!  Also seems to be due to weak core. Discussed need to strengthen this, and she states that she cannot. Also will look into referral for sclerotherapy to see if decreasing pain from scar tissue would help. She does have some somatic dysfunctions that I think are contributing to her symptoms. I think she would benefit from OMT. Patient treated today with good results. Continue to monitor and recheck in 2-3 weeks.

## 2015-04-07 DIAGNOSIS — G894 Chronic pain syndrome: Secondary | ICD-10-CM | POA: Diagnosis not present

## 2015-04-07 DIAGNOSIS — Z0289 Encounter for other administrative examinations: Secondary | ICD-10-CM | POA: Diagnosis not present

## 2015-04-07 DIAGNOSIS — M797 Fibromyalgia: Secondary | ICD-10-CM | POA: Diagnosis not present

## 2015-04-07 DIAGNOSIS — Z8739 Personal history of other diseases of the musculoskeletal system and connective tissue: Secondary | ICD-10-CM | POA: Diagnosis not present

## 2015-04-08 ENCOUNTER — Encounter: Payer: Self-pay | Admitting: Podiatry

## 2015-04-08 ENCOUNTER — Ambulatory Visit (INDEPENDENT_AMBULATORY_CARE_PROVIDER_SITE_OTHER): Payer: Medicare Other | Admitting: Podiatry

## 2015-04-08 DIAGNOSIS — M722 Plantar fascial fibromatosis: Secondary | ICD-10-CM | POA: Diagnosis not present

## 2015-04-08 DIAGNOSIS — M2011 Hallux valgus (acquired), right foot: Secondary | ICD-10-CM | POA: Diagnosis not present

## 2015-04-08 DIAGNOSIS — M2170 Unequal limb length (acquired), unspecified site: Secondary | ICD-10-CM

## 2015-04-08 NOTE — Patient Instructions (Signed)

## 2015-04-11 NOTE — Progress Notes (Signed)
Patient ID: Sharon Rodriguez, female   DOB: 12/09/1980, 35 y.o.   MRN: [id number]  Subjective: 35 year old female presents the office a follow-up evaluation of limb length discrepancy. She states that since when the left on left side she has noticed improvement of her symptoms. Also since last appointment she states that she has developed pain to her left heel as well as painful bunion on the right foot. She wishes to hold off on any x-rays at this time. She states the pain then goes however she is not having pain at this time to either site. She denies any recent injury or trauma. She denies any swelling or redness. Denies any systemic complaints such as fevers, chills, nausea, vomiting. No acute changes since last appointment, and no other complaints at this time.   Objective: AAO x3, NAD DP/PT pulses palpable bilaterally, CRT less than 3 seconds Protective sensation intact with Simms Weinstein monofilament There is subjective tenderness, although currently no tenderness, to palpation to the plantar medial tubercle of the calcaneus at the insertion of the plantar fascial in the left side. There is no pain on the course of plantar fascia. No pain with lateral compression of the calcaneus. On the right foot there is a prominence the dorsal medial aspect of the first metatarsal head on the bunion site. Subjectively there is tenderness of this area however she is having pain this time. There is no area pinpoint bony tenderness or pain the vibratory sensation of bilateral lower extremities. There is no overlying edema, erythema, increase in warmth bilaterally. No open lesions or pre-ulcerative lesions.  No pain with calf compression, swelling, warmth, erythema  Assessment: Limb length discrepancy with resolving symptoms, left heel pain likely result of plantar fasciitis and right bunion  Plan: -All treatment options discussed with the patient including all alternatives, risks, complications.  -I  recommended x-rays today have her she declined. She states of symptoms persist she will get an x-ray. -Stretching and icing the left side. She can modifications and supportive shoe gear. Offloading for the right bunion. Given her limb length discrepancy as well as her other issues recommend a custom orthotics. A prescription for orthotics were given for [facility].  -Patient encouraged to call the office with any questions, concerns, change in symptoms.   Celesta Gentile, DPM

## 2015-04-12 ENCOUNTER — Ambulatory Visit: Payer: Medicare Other | Admitting: Family Medicine

## 2015-04-20 ENCOUNTER — Encounter: Payer: Self-pay | Admitting: Family Medicine

## 2015-04-20 ENCOUNTER — Ambulatory Visit (INDEPENDENT_AMBULATORY_CARE_PROVIDER_SITE_OTHER): Payer: Medicare Other | Admitting: Family Medicine

## 2015-04-20 VITALS — BP 105/66 | HR 94 | Temp 98.5°F | Wt 156.0 lb

## 2015-04-20 DIAGNOSIS — M999 Biomechanical lesion, unspecified: Secondary | ICD-10-CM

## 2015-04-20 DIAGNOSIS — M9905 Segmental and somatic dysfunction of pelvic region: Secondary | ICD-10-CM

## 2015-04-20 DIAGNOSIS — R102 Pelvic and perineal pain: Principal | ICD-10-CM

## 2015-04-20 DIAGNOSIS — M9902 Segmental and somatic dysfunction of thoracic region: Secondary | ICD-10-CM | POA: Diagnosis not present

## 2015-04-20 DIAGNOSIS — M9904 Segmental and somatic dysfunction of sacral region: Secondary | ICD-10-CM | POA: Diagnosis not present

## 2015-04-20 DIAGNOSIS — M9901 Segmental and somatic dysfunction of cervical region: Secondary | ICD-10-CM

## 2015-04-20 DIAGNOSIS — M9909 Segmental and somatic dysfunction of abdomen and other regions: Secondary | ICD-10-CM

## 2015-04-20 DIAGNOSIS — M489 Spondylopathy, unspecified: Secondary | ICD-10-CM

## 2015-04-20 DIAGNOSIS — M9908 Segmental and somatic dysfunction of rib cage: Secondary | ICD-10-CM

## 2015-04-20 DIAGNOSIS — N949 Unspecified condition associated with female genital organs and menstrual cycle: Secondary | ICD-10-CM

## 2015-04-20 DIAGNOSIS — M9903 Segmental and somatic dysfunction of lumbar region: Secondary | ICD-10-CM

## 2015-04-20 DIAGNOSIS — G8929 Other chronic pain: Secondary | ICD-10-CM | POA: Diagnosis not present

## 2015-04-20 DIAGNOSIS — M9900 Segmental and somatic dysfunction of head region: Secondary | ICD-10-CM | POA: Diagnosis not present

## 2015-04-20 NOTE — Progress Notes (Signed)
BP 105/66 mmHg  Pulse 94  Temp(Src) 98.5 F (36.9 C)  Wt 156 lb (70.761 kg)  SpO2 100%   Subjective:    Patient ID: Sharon Rodriguez, female    DOB: [birth year], 35 y.o.   MRN: [id number]  HPI: Sharon Rodriguez is a 35 y.o. female  Chief Complaint  Patient presents with  . Pelvic Pain   [name] did well following her last visit for about 2 weeks, but then she went for a walk and her pain came back. She wanted to come in earlier, but her car is broken. Her pain is in her pelvis, sacrum and pubic bone. She feels like it is "out of place". It is tight and burning. It makes her feel like she can't walk. It comes with some numbness and tingling. She also notes pain in her upper chest near her sternum and notes that she has been coughing a lot. She notes that OMT makes the pain better, and that walking and movement makes the pain worse. It's pretty much constant. She has difficult time describing the pain. No radiation. She is otherwise feeling well besides the cough. No other concerns or complaints at this time.   Relevant past medical, surgical, family and social history reviewed and updated as indicated. Interim medical history since our last visit reviewed. Allergies and medications reviewed and updated.  Review of Systems  Constitutional: Negative.   Respiratory: Negative.   Cardiovascular: Negative.   Musculoskeletal: Positive for myalgias, back pain, joint swelling, arthralgias, neck pain and neck stiffness. Negative for gait problem.  Neurological: Negative.   Psychiatric/Behavioral: Negative.     Per HPI unless specifically indicated above     Objective:    BP 105/66 mmHg  Pulse 94  Temp(Src) 98.5 F (36.9 C)  Wt 156 lb (70.761 kg)  SpO2 100%  Wt Readings from Last 3 Encounters:  04/20/15 156 lb (70.761 kg)  03/26/15 155 lb (70.308 kg)  03/11/15 157 lb (71.215 kg)    Physical Exam  Constitutional: She is oriented to person, place, and time. She appears well-developed  and well-nourished. No distress.  HENT:  Head: Normocephalic and atraumatic.  Right Ear: Hearing normal.  Left Ear: Hearing normal.  Nose: Nose normal.  Eyes: Conjunctivae and lids are normal. Right eye exhibits no discharge. Left eye exhibits no discharge. No scleral icterus.  Cardiovascular: Normal rate, regular rhythm, normal heart sounds and intact distal pulses.  Exam reveals no gallop and no friction rub.   No murmur heard. Pulmonary/Chest: Effort normal and breath sounds normal. No respiratory distress. She has no wheezes. She has no rales. She exhibits no tenderness.  Abdominal: Soft. She exhibits no distension and no mass. There is no tenderness. There is no rebound and no guarding.  Neurological: She is alert and oriented to person, place, and time.  Skin: Skin is warm, dry and intact. No rash noted. No erythema. No pallor.  Psychiatric: She has a normal mood and affect. Her speech is normal and behavior is normal. Judgment and thought content normal. Cognition and memory are normal.  Nursing note and vitals reviewed. Musculoskeletal:  Exam found Decreased ROM, Tissue texture changes, Tenderness to palpation and Asymmetry of patient's  head, neck, thorax, ribs, lumbar, pelvis, sacrum and abdomen Osteopathic Structural Exam:   Head: hypertonic subocciptal muscles on the R  Neck: trap spasm on the R  Thorax: T2-5SLRR  Ribs: Ribs 5-6 locked up on the R, Rib 7-8 locked up on the L  Lumbar:  QL hypertonic on the R  Pelvis: Anterior R innominate, pubic bone sheered on the L  Sacrum: R on R torsion, SI joint restricted on the R  Abdomen: diaphragm spasm on the R   Results for orders placed or performed in visit on 02/17/15  Thyroid Panel With TSH  Result Value Ref Range   TSH 2.160 0.450 - 4.500 uIU/mL   T4, Total 7.9 4.5 - 12.0 ug/dL   T3 Uptake Ratio 27 24 - 39 %   Free Thyroxine Index 2.1 1.2 - 4.9      Assessment & Plan:   Problem List Items Addressed This Visit       Other   Chronic pelvic pain in female - Primary    This may be due to a leg length discrepancy, but seems to be primarily due to myofascial issues and also likely due 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 Saw podiatry prior to last visit and started on lift. Doing better!  Also seems to be due to weak core. Discussed need to strengthen this, and she states that she cannot. Also will look into referral for sclerotherapy to see if decreasing pain from scar tissue would help. She does have some somatic dysfunctions that I think are contributing to her symptoms. I think she would benefit from OMT. Patient treated today with good results. Continue to monitor and recheck in 2-3 weeks.       Other Visit Diagnoses    Nonallopathic lesion of abdomen        Pelvic region somatic dysfunction        Sacral region somatic dysfunction        Somatic dysfunction of cervical region        Cranial somatic dysfunction        Rib cage region somatic dysfunction        Lumbar dysfunction        Thoracic region somatic dysfunction          After verbal consent was obtained, patient was treated today with osteopathic manipulative medicine to the regions of the head, neck, thorax, ribs, lumbar, pelvis, sacrum and abdomen using the techniques of cranial, myofascial release, counterstrain, muscle energy, HVLA and soft tissue. Areas of compensation relating to her primary pain source also treated. Patient tolerated the procedure well with good objective and good subjective improvement in symptoms. She left the room in good condition. He was advised to stay well hydrated and that he may have some soreness following the procedure. If not improving or worsening, he will call and come in. She will return for reevaluation  in 1-2 weeks.   Follow up plan: Return 1-2 weeks.

## 2015-04-20 NOTE — Assessment & Plan Note (Signed)
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 Saw podiatry prior to last visit and started on lift. Doing better!  Also seems to be due to weak core. Discussed need to strengthen this, and she states that she cannot. Also will look into referral for sclerotherapy to see if decreasing pain from scar tissue would help. She does have some somatic dysfunctions that I think are contributing to her symptoms. I think she would benefit from OMT. Patient treated today with good results. Continue to monitor and recheck in 2-3 weeks.

## 2015-04-27 ENCOUNTER — Encounter: Payer: Self-pay | Admitting: Family Medicine

## 2015-04-27 ENCOUNTER — Ambulatory Visit (INDEPENDENT_AMBULATORY_CARE_PROVIDER_SITE_OTHER): Payer: Medicare Other | Admitting: Family Medicine

## 2015-04-27 VITALS — BP 117/74 | HR 108 | Temp 98.3°F | Wt 160.0 lb

## 2015-04-27 DIAGNOSIS — J029 Acute pharyngitis, unspecified: Secondary | ICD-10-CM | POA: Diagnosis not present

## 2015-04-27 DIAGNOSIS — J40 Bronchitis, not specified as acute or chronic: Secondary | ICD-10-CM

## 2015-04-27 DIAGNOSIS — R062 Wheezing: Secondary | ICD-10-CM

## 2015-04-27 MED ORDER — BENZONATATE 200 MG PO CAPS: 200.0000 mg | ORAL_CAPSULE | Freq: Three times a day (TID) | ORAL | Status: DC | PRN

## 2015-04-27 MED ORDER — DOXYCYCLINE HYCLATE 100 MG PO TABS: 100.0000 mg | ORAL_TABLET | Freq: Two times a day (BID) | ORAL | Status: DC

## 2015-04-27 MED ORDER — PREDNISONE 10 MG PO TABS: ORAL_TABLET | ORAL | Status: DC

## 2015-04-27 MED ORDER — ALBUTEROL SULFATE (2.5 MG/3ML) 0.083% IN NEBU: 2.5000 mg | INHALATION_SOLUTION | Freq: Once | RESPIRATORY_TRACT | Status: DC

## 2015-04-27 MED ORDER — FLUCONAZOLE 150 MG PO TABS: 150.0000 mg | ORAL_TABLET | Freq: Once | ORAL | Status: DC

## 2015-04-27 MED ORDER — HYDROCOD POLST-CPM POLST ER 10-8 MG/5ML PO SUER: 5.0000 mL | Freq: Every evening | ORAL | Status: DC | PRN

## 2015-04-27 NOTE — Patient Instructions (Signed)
lactobacillis Acidophilis

## 2015-04-27 NOTE — Progress Notes (Signed)
BP 117/74 mmHg  Pulse 108  Temp(Src) 98.3 F (36.8 C)  Wt 160 lb (72.576 kg)  SpO2 100%   Subjective:    Patient ID: Sharon Rodriguez, female    DOB: [birth year], 34 y.o.   MRN: [id number]  HPI: Sharon Rodriguez is a 35 y.o. female  Chief Complaint  Patient presents with  . URI   UPPER RESPIRATORY TRACT INFECTION- took some amoxicillin that she had at home. 4 pills, doesn't know the dose Duration: 1 week Worst symptom: sore throat, coughing Fever: no Cough: yes Shortness of breath: no Wheezing: no Chest pain: no Chest tightness: yes Chest congestion: yes Nasal congestion: yes Runny nose: yes Post nasal drip: no Sneezing: yes Sore throat: yes Swollen glands: yes Sinus pressure: no Headache: yes Face pain: no Toothache: no Ear pain: yes "right Ear pressure: yes bilateral Eyes red/itching:yes Eye drainage/crusting: no  Vomiting: no Rash: no, has some flea bites on her ankles Fatigue: yes Sick contacts: yes Strep contacts: no  Context: worse Recurrent sinusitis: no Relief with OTC cold/cough medications: no  Treatments attempted: none and antibiotics    Relevant past medical, surgical, family and social history reviewed and updated as indicated. Interim medical history since our last visit reviewed. Allergies and medications reviewed and updated.  Review of Systems  Constitutional: Negative.   HENT: Positive for congestion, ear pain, rhinorrhea, sinus pressure, sneezing and sore throat. Negative for dental problem, drooling, ear discharge, facial swelling, hearing loss, mouth sores, nosebleeds, postnasal drip, tinnitus, trouble swallowing and voice change.   Respiratory: Positive for cough, chest tightness, shortness of breath and wheezing. Negative for apnea, choking and stridor.   Cardiovascular: Negative.   Gastrointestinal: Negative.   Psychiatric/Behavioral: Negative.     Per HPI unless specifically indicated above     Objective:    BP 117/74 mmHg   Pulse 108  Temp(Src) 98.3 F (36.8 C)  Wt 160 lb (72.576 kg)  SpO2 100%  Wt Readings from Last 3 Encounters:  04/27/15 160 lb (72.576 kg)  04/20/15 156 lb (70.761 kg)  03/26/15 155 lb (70.308 kg)    Physical Exam  Constitutional: She is oriented to person, place, and time. She appears well-developed and well-nourished. No distress.  HENT:  Head: Normocephalic and atraumatic.  Right Ear: Hearing, tympanic membrane, external ear and ear canal normal.  Left Ear: Hearing, tympanic membrane, external ear and ear canal normal.  Nose: Mucosal edema and rhinorrhea present. No nose lacerations, sinus tenderness, nasal deformity, septal deviation or nasal septal hematoma. No epistaxis.  No foreign bodies. Right sinus exhibits no maxillary sinus tenderness and no frontal sinus tenderness. Left sinus exhibits no maxillary sinus tenderness and no frontal sinus tenderness.  Mouth/Throat: Uvula is midline, oropharynx is clear and moist and mucous membranes are normal. No oropharyngeal exudate.  Eyes: Conjunctivae, EOM and lids are normal. Pupils are equal, round, and reactive to light. Right eye exhibits no discharge. Left eye exhibits no discharge. No scleral icterus.  Neck: Normal range of motion. Neck supple. No JVD present. No tracheal deviation present. No thyromegaly present.  Cardiovascular: Regular rhythm, normal heart sounds and intact distal pulses.  Tachycardia present.  Exam reveals no gallop and no friction rub.   No murmur heard. Pulmonary/Chest: Effort normal. No stridor. No respiratory distress. She has wheezes. She has no rales. She exhibits no tenderness.  Musculoskeletal: Normal range of motion.  Lymphadenopathy:    She has cervical adenopathy.  Neurological: She is alert and oriented to person, place,  and time.  Skin: Skin is warm, dry and intact. No rash noted. She is not diaphoretic. No erythema. No pallor.  Psychiatric: She has a normal mood and affect. Her speech is normal and  behavior is normal. Judgment and thought content normal. Cognition and memory are normal.  Nursing note and vitals reviewed.   Results for orders placed or performed in visit on 02/17/15  Thyroid Panel With TSH  Result Value Ref Range   TSH 2.160 0.450 - 4.500 uIU/mL   T4, Total 7.9 4.5 - 12.0 ug/dL   T3 Uptake Ratio 27 24 - 39 %   Free Thyroxine Index 2.1 1.2 - 4.9      Assessment & Plan:   Problem List Items Addressed This Visit    None    Visit Diagnoses    Pharyngitis    -  Primary    Will swab for strep. Await results. Negative.     Relevant Orders    Rapid strep screen (not at [facility])    Wheezing        Better after neb. But now with crackles in the RLL- will treat with doxy    Relevant Medications    albuterol (PROVENTIL) (2.5 MG/3ML) 0.083% nebulizer solution 2.5 mg    Bronchitis        Will treat with prednisone and doxycyline. Tessalon and tussionex given for comfort. Lung recheck at follow up on Monday. Call with any concerns.         Follow up plan: Return As scheduled.

## 2015-04-29 ENCOUNTER — Ambulatory Visit: Payer: Medicare Other | Admitting: Family Medicine

## 2015-04-29 LAB — RAPID STREP SCREEN (MED CTR MEBANE ONLY): Strep Gp A Ag, IA W/Reflex: NEGATIVE

## 2015-04-29 LAB — CULTURE, GROUP A STREP: Strep A Culture: NEGATIVE

## 2015-04-30 ENCOUNTER — Ambulatory Visit: Payer: Medicare Other | Admitting: Family Medicine

## 2015-04-30 ENCOUNTER — Ambulatory Visit (INDEPENDENT_AMBULATORY_CARE_PROVIDER_SITE_OTHER): Payer: Medicare Other | Admitting: Family Medicine

## 2015-04-30 ENCOUNTER — Encounter: Payer: Self-pay | Admitting: Family Medicine

## 2015-04-30 VITALS — BP 105/65 | HR 75 | Temp 98.9°F | Ht 61.6 in | Wt 159.0 lb

## 2015-04-30 DIAGNOSIS — M9906 Segmental and somatic dysfunction of lower extremity: Secondary | ICD-10-CM | POA: Diagnosis not present

## 2015-04-30 DIAGNOSIS — M9908 Segmental and somatic dysfunction of rib cage: Secondary | ICD-10-CM

## 2015-04-30 DIAGNOSIS — M9909 Segmental and somatic dysfunction of abdomen and other regions: Secondary | ICD-10-CM

## 2015-04-30 DIAGNOSIS — N949 Unspecified condition associated with female genital organs and menstrual cycle: Secondary | ICD-10-CM | POA: Diagnosis not present

## 2015-04-30 DIAGNOSIS — R102 Pelvic and perineal pain: Principal | ICD-10-CM

## 2015-04-30 DIAGNOSIS — M9903 Segmental and somatic dysfunction of lumbar region: Secondary | ICD-10-CM

## 2015-04-30 DIAGNOSIS — M489 Spondylopathy, unspecified: Secondary | ICD-10-CM

## 2015-04-30 DIAGNOSIS — G8929 Other chronic pain: Secondary | ICD-10-CM

## 2015-04-30 DIAGNOSIS — M9904 Segmental and somatic dysfunction of sacral region: Secondary | ICD-10-CM

## 2015-04-30 DIAGNOSIS — M999 Biomechanical lesion, unspecified: Secondary | ICD-10-CM

## 2015-04-30 DIAGNOSIS — M9905 Segmental and somatic dysfunction of pelvic region: Secondary | ICD-10-CM

## 2015-04-30 NOTE — Assessment & Plan Note (Signed)
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 Saw podiatry and started on lift. This seemed to help for a couple of weeks, but then stopped helping as much. Also seems to be due to weak core. Discussed need to strengthen this, and she states that she cannot. Has not done pelvic PT. Will send her for this. Referral generated today. She does not seem to be having as much of a lasting benefit from OMT as would hope. We will see how she does with concurrent PT. She does have some somatic dysfunctions that I think are contributing to her symptoms. I think she would benefit from OMT. Patient treated today with fair results. Continue to monitor and recheck in 2-3 weeks.

## 2015-04-30 NOTE — Progress Notes (Signed)
BP 105/65 mmHg  Pulse 75  Temp(Src) 98.9 F (37.2 C)  Ht 5' 1.6" (1.565 m)  Wt 159 lb (72.122 kg)  BMI 29.45 kg/m2  SpO2 99%   Subjective:    Patient ID: Sharon Rodriguez, female    DOB: [birth year], 35 y.o.   MRN: [id number]  HPI: Sharon Rodriguez is a 35 y.o. female  Chief Complaint  Patient presents with  . Pelvic Pain    OMM   Sharon Rodriguez states that her cold is getting a little better. She feels like she is still wheezing. She is upset today because she feels like her pelvis "went out." She notes that she can barely walk and that she is having a lot of pain. She cannot be very specific about her pain and just says that it hurts. She notes that it hurts in her R buttock and her pubic bone. It is severe pain. It does not radiate. It is worse with activity and walking. Better with OMT, however it doesn't last long. No other signs and symptoms. She states that her back and her belly are also tender. She has done PT many times before but notes that they just "tell me to breathe, which I don't understand, strengthen my core, and put me on a TENS unit." She does not think she has very done any pelvic PT. No other concerns or complaints at this time.   Relevant past medical, surgical, family and social history reviewed and updated as indicated. Interim medical history since our last visit reviewed. Allergies and medications reviewed and updated.  Review of Systems  Constitutional: Negative.   Respiratory: Positive for wheezing. Negative for apnea, cough, choking, chest tightness, shortness of breath and stridor.   Cardiovascular: Negative.   Genitourinary: Positive for pelvic pain. Negative for dysuria, urgency, frequency, hematuria, flank pain, vaginal bleeding, vaginal discharge, enuresis, difficulty urinating, genital sores, vaginal pain, menstrual problem and dyspareunia.  Musculoskeletal: Positive for myalgias, back pain, arthralgias and gait problem. Negative for joint swelling, neck pain  and neck stiffness.  Skin: Negative.   Psychiatric/Behavioral: Negative.     Per HPI unless specifically indicated above     Objective:    BP 105/65 mmHg  Pulse 75  Temp(Src) 98.9 F (37.2 C)  Ht 5' 1.6" (1.565 m)  Wt 159 lb (72.122 kg)  BMI 29.45 kg/m2  SpO2 99%  Wt Readings from Last 3 Encounters:  04/30/15 159 lb (72.122 kg)  04/27/15 160 lb (72.576 kg)  04/20/15 156 lb (70.761 kg)    Physical Exam  Constitutional: She is oriented to person, place, and time. She appears well-developed and well-nourished. No distress.  HENT:  Head: Normocephalic and atraumatic.  Right Ear: Hearing normal.  Left Ear: Hearing normal.  Nose: Nose normal.  Eyes: Conjunctivae and lids are normal. Right eye exhibits no discharge. Left eye exhibits no discharge. No scleral icterus.  Cardiovascular: Normal rate, regular rhythm, normal heart sounds and intact distal pulses.  Exam reveals no gallop and no friction rub.   No murmur heard. Pulmonary/Chest: Effort normal and breath sounds normal. No respiratory distress. She has no wheezes. She has no rales. She exhibits no tenderness.  Musculoskeletal: Normal range of motion.  Neurological: She is alert and oriented to person, place, and time.  Skin: Skin is intact. No rash noted.  Psychiatric: She has a normal mood and affect. Her speech is normal and behavior is normal. Judgment and thought content normal. Cognition and memory are normal.  Nursing note and  vitals reviewed. Musculoskeletal:  Exam found Decreased ROM, Tissue texture changes, Tenderness to palpation and Asymmetry of patient's  ribs, lumbar, pelvis, sacrum, lower extremity and abdomen Osteopathic Structural Exam:   Ribs: Ribs 7-10 locked up on the R, Rib 8 locked up on the L  Lumbar: psoas hypertonicity bilaterally L>R, QL hypertonic on the R  Pelvis: Posterior R innominate, inferior pubic sheer on the L  Sacrum: L on R sacral torsion, SI joint restricted on the R, interosseous strain  at S2-3 on the R  Lower Extremity: glut spasm on the R  Abdomen: pelvic diaphragm restriction in the R with fascial strain into R hip   Results for orders placed or performed in visit on 04/27/15  Rapid strep screen (not at [facility])  Result Value Ref Range   Strep Gp A Ag, IA W/Reflex Negative Negative  Culture, Group A Strep  Result Value Ref Range   Strep A Culture Negative       Assessment & Plan:   Problem List Items Addressed This Visit      Other   Chronic pelvic pain in female - Primary    This may be due to a leg length discrepancy, but seems to be primarily due to myofascial issues and also likely due 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 Saw podiatry and started on lift. This seemed to help for a couple of weeks, but then stopped helping as much. Also seems to be due to weak core. Discussed need to strengthen this, and she states that she cannot. Has not done pelvic PT. Will send her for this. Referral generated today. She does not seem to be having as much of a lasting benefit from OMT as would hope. We will see how she does with concurrent PT. She does have some somatic dysfunctions that I think are contributing to her symptoms. I think she would benefit from OMT. Patient treated today with fair results. Continue to monitor and recheck in 2-3 weeks.      Relevant Orders   Ambulatory referral to Physical Therapy    Other Visit Diagnoses    Nonallopathic lesion of abdomen        Rib cage region somatic dysfunction        Sacral region somatic dysfunction        Pelvic region somatic dysfunction        Lumbar dysfunction        Somatic dysfunction of lower extremity         After verbal consent was obtained, patient was treated today with osteopathic manipulative medicine to the regions of the ribs, lumbar, pelvis, sacrum, abdomen and lower extremity using the techniques of myofascial release, counterstrain, muscle energy, HVLA and soft tissue. Areas of compensation  relating to her primary pain source also treated. Patient tolerated the procedure well with fair objective and fair subjective improvement in symptoms. She left the room in good condition. She was advised to stay well hydrated and that she may have some soreness following the procedure. If not improving or worsening, she will call and come in. She will return for reevaluation   in 2-3 weeks.   Follow up plan: Return 2-3 weeks.

## 2015-05-03 ENCOUNTER — Ambulatory Visit: Payer: Medicare Other | Admitting: Family Medicine

## 2015-05-20 DIAGNOSIS — M9903 Segmental and somatic dysfunction of lumbar region: Secondary | ICD-10-CM | POA: Diagnosis not present

## 2015-05-20 DIAGNOSIS — M5136 Other intervertebral disc degeneration, lumbar region: Secondary | ICD-10-CM | POA: Diagnosis not present

## 2015-05-20 DIAGNOSIS — M9901 Segmental and somatic dysfunction of cervical region: Secondary | ICD-10-CM | POA: Diagnosis not present

## 2015-05-20 DIAGNOSIS — M531 Cervicobrachial syndrome: Secondary | ICD-10-CM | POA: Diagnosis not present

## 2015-05-20 DIAGNOSIS — M9905 Segmental and somatic dysfunction of pelvic region: Secondary | ICD-10-CM | POA: Diagnosis not present

## 2015-05-20 DIAGNOSIS — M461 Sacroiliitis, not elsewhere classified: Secondary | ICD-10-CM | POA: Diagnosis not present

## 2015-06-28 ENCOUNTER — Ambulatory Visit: Payer: Medicare Other | Attending: Family Medicine | Admitting: Physical Therapy

## 2015-06-28 DIAGNOSIS — R279 Unspecified lack of coordination: Secondary | ICD-10-CM | POA: Insufficient documentation

## 2015-06-28 DIAGNOSIS — M791 Myalgia, unspecified site: Secondary | ICD-10-CM

## 2015-06-28 DIAGNOSIS — R2689 Other abnormalities of gait and mobility: Secondary | ICD-10-CM | POA: Insufficient documentation

## 2015-06-29 NOTE — Therapy (Addendum)
[place] MAIN [facility] SERVICES [address] [place], [place], [address] Phone: [phone]   Fax:  [phone]  Physical Therapy Evaluation  Patient Details  Name: Sharon Rodriguez MRN: [id number] Date of Birth: 1980-06-07 Referring Provider: Park Liter, DO   Encounter Date: 06/28/2015      PT End of Session - 06/29/15 1809    Visit Number 1   Number of Visits 12   Date for PT Re-Evaluation 09/20/15   Authorization Type 1 of 10   g code   PT Start Time [id number]   PT Stop Time 1230   PT Time Calculation (min) 85 min   Activity Tolerance Patient tolerated treatment well;No increased pain   Behavior During Therapy [facility] for tasks assessed/performed      Past Medical History  Diagnosis Date  . TMJ (dislocation of temporomandibular joint)   . Chronic fatigue   . ADD (attention deficit disorder)   . Sleep apnea   . Chronic bronchitis ([place])   . Chronic pain   . Learning disability   . Anxiety   . Rheumatoid arthritis ([place])   . Osteoarthritis   . Depression   . Fibromyalgia   . Thyroid disease   . Hypothyroidism 01/27/2015    Past Surgical History  Procedure Laterality Date  . Cesarean section      2    There were no vitals filed for this visit.       Subjective Assessment - 06/28/15 1119    Subjective 1) pelvic pain located at pubic pain came on in 2012 after a trip to [place] [employer] with increased walking or possibly a ride. Pain was excuriating to the point that she needed a WC and was able to walk. Currently, she finds that her pelvis "goes out of place" with stairclimbing, lifting groceries at 10/10. Denied pain with bowel and bladder. Reported SUI, but not wear pad.  Pt is afraid to walk long distances at family events/ trips. Currently, pain is 6/10.     2) fibromyalgia-related pain: numbness/ tingling on R UE. to thumb dependent on how she holds objects (fork).  Walking from parking lot to clinic caused fatigue.  Pt also  reports neck and shoulder, hips and thigh pain but pt is more concerned about her energy level. Pt reports goes to her chiropractic Tx with temporary relief, pool therapy helped but distance of location became a barrier, and prior PT did not work because the PT only provided pt a TENS unit which flared her up.    3) poor sleep: nocturia 2x/ night. difficulty with staying a sleep and finding comfortable  position.      Pertinent History Pt recently received a osteopathic Tx from referring provider which relieves her pain now from days to 1 week but pt feels the travel to the clinic is difficult because the driving causes arm pain.                            Hx of C-section             [facility] PT Assessment - 06/29/15 1756    Assessment   Medical Diagnosis pelvic pain    Referring Provider Park Liter, DO    Precautions   Precautions None   Restrictions   Weight Bearing Restrictions No   Home Environment   Additional Comments 4 steps    Prior Function   Level of Independence Independent;Independent  with basic ADLs   Observation/Other Assessments   Observations pt tends to overstretch to crack her back. poor sitting posture   Other Surveys  --  PGQ 65%    Coordination   Gross Motor Movements are Fluid and Coordinated --  chest breathing , elevated ribcage   Functional Tests   Functional tests --  bending with forward flexion   Posture/Postural Control   Posture Comments lumbopelvic instability withhip flexion   AROM   Overall AROM Comments hypermobile w/ spinal ext, limited flexion @ 90 flex, pain with SB bilaterally    Palpation   Palpation comment increased mm tensions in B upper trap, tenderness to touch with B PSIS, scar restriction along lower abdomen                      [facility] Adult PT Treatment/Exercise - 06/29/15 1756    Self-Care   Self-Care --  POC, goals, anatomy/physiology    Therapeutic Activites    Therapeutic Activities --  decrease abdominal/  pelvic floor straining w.functional acti   Neuro Re-ed    Neuro Re-ed Details  see pt instructions                 PT Education - 06/29/15 1808    Education provided Yes   Education Details POC, goals, anatomy physiology    Person(s) Educated Patient   Methods Explanation;Demonstration;Tactile cues;Verbal cues;Handout   Comprehension Verbalized understanding;Returned demonstration             PT Long Term Goals - 06/29/15 1819    PT LONG TERM GOAL #1   Title Pt will decrease her score on PGQ from 65% to < 505 in order to return to ADLs   Time 12   Period Weeks   Status New   PT LONG TERM GOAL #2   Title Pt will report decreased fatigue based on the FSS by 50% in order to have energy to improve QOL and have the energy to perform household chores.     .   Time 12   Period Weeks   Status New   PT LONG TERM GOAL #3   Title Pt will demo IND with HEP to maintain  decreased mm spasms of upper neck, shoulders, back in order to better manage fibromyalgia a to  to take care of herself and hr family.     Time 12   Period Weeks   Status New               Plan - 06/29/15 1809    Clinical Impression Statement Pt is a 76 female who c/o lumbopelvic pain, fibromyalgia-related mm pain, fatigue, and  poor sleep with nocturia. Through PT exam, pt 's clinical presentations that are likely contributing factors to her deficits included poor posture, limited spinal mobility, dyscoordination of diaphragm and pelvic floor, weak core strength, scar restrictions over lower abdomen, and mm spasms over neck/ shoulders and back, poor posture and and body mechanics, and lack of understanding of activity pacing principles to manage fibromyalgia-related fatigue.   These deficits impact her ability to perform ADLs and impact her QOL.  One barrier to her prognosis is her Hx of sleep apnea.  Pt reported she has not had an updated sleep study since 2010 and currently does not wear a CPAP machine. PT  called referring provider's clinic and left a message re: the recommendation for pt to receive an updated sleep study.  PT suspects pt's sleep quality,  fibromyalgia-related Sx, and nocturia Sx may also be benefited by appropriate management of sleep apnea along with skilled PT.      Rehab Potential Good   PT Frequency 2x / week   PT Duration 12 weeks   PT Treatment/Interventions ADLs/Self Care Home Management;Aquatic Therapy;Moist Heat;Electrical Stimulation;Gait training;Traction;Stair training;Functional mobility training;Therapeutic activities;Therapeutic exercise;Neuromuscular re-education;Balance training;Patient/family education;Manual techniques;Scar mobilization;Dry needling;Taping;Splinting;Energy conservation   Consulted and Agree with Plan of Care Patient      Patient will benefit from skilled therapeutic intervention in order to improve the following deficits and impairments:  Obesity, Postural dysfunction, Improper body mechanics, Pain, Increased fascial restricitons, Hypomobility, Decreased balance, Decreased mobility, Decreased coordination, Decreased scar mobility, Decreased safety awareness, Hypermobility, Difficulty walking, Decreased strength, Decreased range of motion, Decreased endurance, Abnormal gait, Decreased activity tolerance, Increased muscle spasms, Impaired flexibility  Visit Diagnosis: Myalgia  Unspecified lack of coordination  Other abnormalities of gait and mobility      G-Codes - 24-Jul-2015 1823    Functional Assessment Tool Used PGQ 65% , clinical judgement   Functional Limitation Mobility: Walking and moving around   Mobility: Walking and Moving Around Current Status [phone]) At least 60 percent but less than 80 percent impaired, limited or restricted   Mobility: Walking and Moving Around Goal Status [phone]) At least 40 percent but less than 60 percent impaired, limited or restricted       Problem List Patient Active Problem List   Diagnosis Date Noted   . Lower limb length difference 02/27/2015  . Hypothyroidism 01/27/2015  . Chronic pelvic pain in female 01/27/2015  . Anxiety   . Rheumatoid arthritis ([place])   . Depression   . Fibromyalgia   . TMJ pain dysfunction syndrome   . Chronic bronchitis ([place])   . Chronic fatigue   . Chronic pain   . ADD (attention deficit disorder)   . Learning disability     Jerl Mina ,PT, DPT, E-RYT  06/29/2015, 6:24 PM  [place] MAIN [facility] SERVICES [address] [place], [place], [address] Phone: [phone]   Fax:  [phone]  Name: Sharon Rodriguez MRN: [id number] Date of Birth: [birth year]

## 2015-06-29 NOTE — Addendum Note (Signed)
Addended by: Jerl Mina on: 06/29/2015 06:35 PM   Modules accepted: Orders

## 2015-06-29 NOTE — Patient Instructions (Signed)
Handout on open book  And diaphragmatic breathing with pelvic floor                                                    Preserve the function of your pelvic floor, abdomen, and back.              Avoid decreased straining of abdominal/pelvic floor muscles with less              slouching,  holding your breath with lifting/bowel movements)                                                     FUNCTIONAL POSTURES

## 2015-07-01 DIAGNOSIS — Z8739 Personal history of other diseases of the musculoskeletal system and connective tissue: Secondary | ICD-10-CM | POA: Diagnosis not present

## 2015-07-01 DIAGNOSIS — G894 Chronic pain syndrome: Secondary | ICD-10-CM | POA: Diagnosis not present

## 2015-07-01 DIAGNOSIS — M797 Fibromyalgia: Secondary | ICD-10-CM | POA: Diagnosis not present

## 2015-07-02 ENCOUNTER — Ambulatory Visit (INDEPENDENT_AMBULATORY_CARE_PROVIDER_SITE_OTHER): Payer: Medicare Other | Admitting: Family Medicine

## 2015-07-02 ENCOUNTER — Encounter: Payer: Self-pay | Admitting: Family Medicine

## 2015-07-02 ENCOUNTER — Ambulatory Visit: Payer: Medicare Other | Admitting: Physical Therapy

## 2015-07-02 VITALS — BP 95/62 | HR 88 | Temp 98.7°F | Wt 160.0 lb

## 2015-07-02 DIAGNOSIS — J42 Unspecified chronic bronchitis: Secondary | ICD-10-CM

## 2015-07-02 DIAGNOSIS — R279 Unspecified lack of coordination: Secondary | ICD-10-CM | POA: Diagnosis not present

## 2015-07-02 DIAGNOSIS — M9904 Segmental and somatic dysfunction of sacral region: Secondary | ICD-10-CM | POA: Diagnosis not present

## 2015-07-02 DIAGNOSIS — M9909 Segmental and somatic dysfunction of abdomen and other regions: Secondary | ICD-10-CM | POA: Diagnosis not present

## 2015-07-02 DIAGNOSIS — M9900 Segmental and somatic dysfunction of head region: Secondary | ICD-10-CM | POA: Diagnosis not present

## 2015-07-02 DIAGNOSIS — G8929 Other chronic pain: Secondary | ICD-10-CM

## 2015-07-02 DIAGNOSIS — M791 Myalgia, unspecified site: Secondary | ICD-10-CM

## 2015-07-02 DIAGNOSIS — E039 Hypothyroidism, unspecified: Secondary | ICD-10-CM | POA: Diagnosis not present

## 2015-07-02 DIAGNOSIS — N949 Unspecified condition associated with female genital organs and menstrual cycle: Secondary | ICD-10-CM

## 2015-07-02 DIAGNOSIS — M9901 Segmental and somatic dysfunction of cervical region: Secondary | ICD-10-CM | POA: Diagnosis not present

## 2015-07-02 DIAGNOSIS — M9908 Segmental and somatic dysfunction of rib cage: Secondary | ICD-10-CM

## 2015-07-02 DIAGNOSIS — R5382 Chronic fatigue, unspecified: Secondary | ICD-10-CM

## 2015-07-02 DIAGNOSIS — R102 Pelvic and perineal pain: Secondary | ICD-10-CM

## 2015-07-02 DIAGNOSIS — R2689 Other abnormalities of gait and mobility: Secondary | ICD-10-CM

## 2015-07-02 DIAGNOSIS — M9905 Segmental and somatic dysfunction of pelvic region: Secondary | ICD-10-CM | POA: Diagnosis not present

## 2015-07-02 DIAGNOSIS — M9902 Segmental and somatic dysfunction of thoracic region: Secondary | ICD-10-CM

## 2015-07-02 DIAGNOSIS — M999 Biomechanical lesion, unspecified: Secondary | ICD-10-CM

## 2015-07-02 DIAGNOSIS — M546 Pain in thoracic spine: Secondary | ICD-10-CM

## 2015-07-02 MED ORDER — FLUTICASONE-SALMETEROL 250-50 MCG/DOSE IN AEPB: 1.0000 | INHALATION_SPRAY | Freq: Two times a day (BID) | RESPIRATORY_TRACT | Status: DC

## 2015-07-02 NOTE — Patient Instructions (Addendum)
  Christele B. Meda Coffee, M.D.  Rheumatology Department [place], [place] [address] phone: [phone] fax: [phone]    Energizing sequence:  Child's pose -->  Middle, left and right 3 breaths each   Table top --> Lions Breath 3 x    Tuck toes under, raise hips and arms up, armpits hug into socket  childs pose 3 breaths   Off the bed-->   Standing shake : lymph jostling from heel pumps to shoulders loose   Centering pose (palms at the heart, pelvis neutral , knees slught bent, grounding through the feet)     Add to open book stretch: On belly, frog stretch, inhale and exhale drop heels out tot he side ~30 deg ( 10 x )     Strengthening: 5 times sit to stand (and end of with pelvic neutral)   X 3 x day

## 2015-07-02 NOTE — Assessment & Plan Note (Signed)
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 Saw podiatry and started on lift. This seemed to help for a couple of weeks, but then stopped helping as much. Also seems to be due to weak core. Discussed need to strengthen this, and she states that she cannot. Just started pelvic PT and is hopeful that it will help. She does have some somatic dysfunctions that I think are contributing to her symptoms. I think she would benefit from OMT. Patient treated today with fair results. Continue to monitor and recheck in as needed.

## 2015-07-02 NOTE — Assessment & Plan Note (Signed)
Will check thyroid again. Possibly due to untreated OSA. Referral to sleep medicine made today. Await sleep study.

## 2015-07-02 NOTE — Progress Notes (Signed)
BP 95/62 mmHg  Pulse 88  Temp(Src) 98.7 F (37.1 C)  Wt 160 lb (72.576 kg)  SpO2 96%  LMP 06/21/2015 (Approximate)   Subjective:    Patient ID: Sharon Rodriguez, female    DOB: 08-20-1980, 35 y.o.   MRN: [id number]  HPI: Sharon Rodriguez is a 35 y.o. female  Chief Complaint  Patient presents with  . Pelvic Pain   SLEEP APNEA- was apparently diagnosed in 2010. Has not had updated Sleep study since then. Does not use CPAP. Very tired. Poor sleep. Needs new sleep study. Neck circumfernece 33 cm Sleep apnea status: uncontrolled Duration: chronic Satisfied with current treatment?:  no CPAP use:  no Sleep quality with CPAP use: Not using Treament compliance:poor compliance Last sleep study: 2010- OSA wasn't bad enough to use CPAP at that time Treatments attempted: CPAP  Wakes feeling refreshed:  no Daytime hypersomnolence: yes  Fatigue:  yes Insomnia:  no Good sleep hygiene:  no Difficulty falling asleep:  no Difficulty staying asleep:  no Snoring bothers bed partner:  yes Observed apnea by bed partner: yes Obesity:  no Hypertension: no  Pulmonary hypertension:  no Coronary artery disease:  no  Still wheezy, especially at night. Qvar doesn't seem to be helping enough. Having trouble with the pollen, making it harder for her breath.   Saw PT today. Likes her. She is going to get back into pool therapy. She is going to continue. Pain in her pelvis isn't so bad right now as she just came from PT. Having quite a bit of tightness in her upper back and shoulders. Better with stretching, worse with lots of movement. Chronic. In slight exacerbation. No radiation. No other signs or symptoms. She finds that OMT is helpful and would like to continue with it. No other concerns at this time.   Relevant past medical, surgical, family and social history reviewed and updated as indicated. Interim medical history since our last visit reviewed. Allergies and medications reviewed and  updated.  Review of Systems  Constitutional: Negative.   HENT: Negative.   Respiratory: Positive for cough and shortness of breath. Negative for apnea, choking, chest tightness, wheezing and stridor.   Cardiovascular: Negative.   Musculoskeletal: Positive for myalgias, back pain, joint swelling, arthralgias, gait problem, neck pain and neck stiffness.  Skin: Negative.   Neurological: Negative.   Psychiatric/Behavioral: Positive for sleep disturbance. Negative for suicidal ideas, hallucinations, behavioral problems, confusion, self-injury, dysphoric mood, decreased concentration and agitation. The patient is not nervous/anxious and is not hyperactive.     Per HPI unless specifically indicated above     Objective:    BP 95/62 mmHg  Pulse 88  Temp(Src) 98.7 F (37.1 C)  Wt 160 lb (72.576 kg)  SpO2 96%  LMP 06/21/2015 (Approximate)  Wt Readings from Last 3 Encounters:  07/02/15 160 lb (72.576 kg)  04/30/15 159 lb (72.122 kg)  04/27/15 160 lb (72.576 kg)    Physical Exam  Constitutional: She is oriented to person, place, and time. She appears well-developed and well-nourished. No distress.  HENT:  Head: Normocephalic and atraumatic.  Right Ear: Hearing normal.  Left Ear: Hearing normal.  Nose: Nose normal.  Eyes: Conjunctivae and lids are normal. Right eye exhibits no discharge. Left eye exhibits no discharge. No scleral icterus.  Cardiovascular: Normal rate, regular rhythm, normal heart sounds and intact distal pulses.  Exam reveals no gallop and no friction rub.   No murmur heard. Pulmonary/Chest: Effort normal. No respiratory distress. She has wheezes.  She has no rales. She exhibits no tenderness.  Neurological: She is alert and oriented to person, place, and time.  Skin: Skin is warm, dry and intact. No rash noted. No erythema. No pallor.  Psychiatric: She has a normal mood and affect. Her speech is normal and behavior is normal. Judgment and thought content normal.  Cognition and memory are normal.  Nursing note and vitals reviewed. Musculoskeletal:  Exam found Decreased ROM, Tissue texture changes, Tenderness to palpation and Asymmetry of patient's  head, neck, thorax, ribs, pelvis, sacrum and abdomen Osteopathic Structural Exam:   Head: hypertonic subocciptal muscles b/l  Neck: SCM hypertonic on the R, C4ESRR  Thorax: T3-5SLRR, trap hypertonic on the R  Ribs: Ribs 5-8 locked up on the R, Ribs 7 and 9 locked up on the L  Pelvis: Anterior R innominate  Sacrum: R on R torsion, SI joint slightly restricted on the R, but improved  Abdomen: pelvic diaphragm restricted with fascial strain into adhesions near R hip   Results for orders placed or performed in visit on 04/27/15  Rapid strep screen (not at [facility])  Result Value Ref Range   Strep Gp A Ag, IA W/Reflex Negative Negative  Culture, Group A Strep  Result Value Ref Range   Strep A Culture Negative       Assessment & Plan:   Problem List Items Addressed This Visit      Respiratory   Chronic bronchitis ([place])    Will try her on advair instead of the qvar. Call with any problems. Check back in 1 month.         Endocrine   Hypothyroidism   Relevant Orders   Thyroid Panel With TSH     Other   Chronic fatigue    Will check thyroid again. Possibly due to untreated OSA. Referral to sleep medicine made today. Await sleep study.      Chronic pelvic pain in female    This may be due to a leg length discrepancy, but seems to be primarily due to myofascial issues and also likely due 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 Saw podiatry and started on lift. This seemed to help for a couple of weeks, but then stopped helping as much. Also seems to be due to weak core. Discussed need to strengthen this, and she states that she cannot. Just started pelvic PT and is hopeful that it will help. She does have some somatic dysfunctions that I think are contributing to her symptoms. I think she would benefit  from OMT. Patient treated today with fair results. Continue to monitor and recheck in as needed.       Other Visit Diagnoses    Bilateral thoracic back pain    -  Primary    Possibly due to coughing and wheezing. She does have SD that are contributing to her symptoms. Would benefit from OMT. Treated today as below.     Nonallopathic lesion of abdomen        Rib cage region somatic dysfunction        Sacral region somatic dysfunction        Pelvic region somatic dysfunction        Somatic dysfunction of cervical region        Cranial somatic dysfunction        Thoracic region somatic dysfunction          After verbal consent was obtained, patient was treated today with osteopathic manipulative medicine to the regions of  the head, neck, thorax, ribs, pelvis, sacrum and abdomen using the techniques of cranial, myofascial release, counterstrain, muscle energy, HVLA and soft tissue. Areas of compensation relating to her primary pain source also treated. Patient tolerated the procedure well with good objective and good subjective improvement in symptoms. She left the room in good condition. She was advised to stay well hydrated and that she may have some soreness following the procedure. If not improving or worsening, she will call and come in. She will return for reevaluation  on a PRN basis.   Follow up plan: Return in about 4 weeks (around 07/30/2015) for recheck lungs.

## 2015-07-02 NOTE — Assessment & Plan Note (Signed)
Will try her on advair instead of the qvar. Call with any problems. Check back in 1 month.

## 2015-07-02 NOTE — Therapy (Signed)
[place] MAIN [facility] SERVICES [address] [place], [place], [address] Phone: [phone]   Fax:  [phone]  Physical Therapy Treatment  Patient Details  Name: Sharon Rodriguez MRN: [id number] Date of Birth: 11-07-1980 Referring Provider: Park Liter, DO   Encounter Date: 07/02/2015      PT End of Session - 07/02/15 2233    Visit Number 2   Number of Visits 12   Date for PT Re-Evaluation 09/20/15   Authorization Type 2 of 10   g code   PT Start Time 2585   PT Stop Time 1200   PT Time Calculation (min) 55 min   Activity Tolerance Patient tolerated treatment well;No increased pain   Behavior During Therapy [facility] for tasks assessed/performed      Past Medical History  Diagnosis Date  . TMJ (dislocation of temporomandibular joint)   . Chronic fatigue   . ADD (attention deficit disorder)   . Sleep apnea   . Chronic bronchitis ([place])   . Chronic pain   . Learning disability   . Anxiety   . Rheumatoid arthritis ([place])   . Osteoarthritis   . Depression   . Fibromyalgia   . Thyroid disease   . Hypothyroidism 01/27/2015    Past Surgical History  Procedure Laterality Date  . Cesarean section      2    There were no vitals filed for this visit.      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    Pertinent History Pt recently received a osteopathic Tx from referring provider which relieves her pain now from days to 1 week but pt feels the travel to the clinic is difficult because the driving causes arm pain.                            Hx of C-section             [facility] PT Assessment - 07/02/15 2231    Observation/Other Assessments   Other Surveys  --  PGQ 65% FSS 6.5 pts   Palpation   SI assessment  increased tensions along lateral border of L sacrum at level of S4-5, slightly deviated  coccyx to L (post Tx more aligned and decreased tensions and tenderness)                      [facility] Adult PT Treatment/Exercise - 07/02/15 2232    Neuro Re-ed    Neuro Re-ed Details  see pt instruction   Exercises   Exercises --  see pt instruction   Manual Therapy   Manual therapy comments STM along lateral border of sacrum, Grade I-II PA mobs along border, rotational mob                 PT Education - 07/02/15 2233    Education provided Yes   Education Details HEP   Person(s) Educated Patient   Methods Explanation;Demonstration;Tactile cues;Verbal cues;Handout   Comprehension Returned demonstration;Verbalized understanding             PT Long Term Goals - 07/02/15 2235    PT LONG TERM GOAL #1   Title Pt will decrease her score on PGQ from 65% to < 505 in order to return to ADLs  Time 12   Period Weeks   Status On-going   PT LONG TERM GOAL #2   Title Pt will report decreased fatigue based on the FSS by 50% (6.7 pt -->3.7pt)   in order to have energy to improve QOL and have the energy to perform household chores an.     .   Time 12   Period Weeks   Status On-going   PT LONG TERM GOAL #3   Title Pt will demo IND with HEP to maintain  decreased mm spasms of upper neck, shoulders, back in order to better manage fibromyalgia a to  to take care of herself and hr family.     Time 12   Period Weeks   Status Partially Met   PT LONG TERM GOAL #4   Title Pt will demo more medially aligned coccyx, mobility along L lateral border of sacrum without tenderness to palpation in order to improve back pain    Time 12   Period Weeks   Status New               Plan - 07/02/15 2233    Clinical Impression Statement Pt demo'd more medially aligned coccyx and increased mobility along lateral border of L sacrum following manual Tx. Pt demo'd HEP correctly and showed good carry over in finding pelvic neutral  in standing to decrease lumbar lordosis. Pt  continues to progress well towards her goals.    Rehab Potential Good   PT Frequency 2x / week   PT Duration 12 weeks   PT Treatment/Interventions ADLs/Self Care Home Management;Aquatic Therapy;Moist Heat;Electrical Stimulation;Gait training;Traction;Stair training;Functional mobility training;Therapeutic activities;Therapeutic exercise;Neuromuscular re-education;Balance training;Patient/family education;Manual techniques;Scar mobilization;Dry needling;Taping;Splinting;Energy conservation   Consulted and Agree with Plan of Care Patient      Patient will benefit from skilled therapeutic intervention in order to improve the following deficits and impairments:  Obesity, Postural dysfunction, Improper body mechanics, Pain, Increased fascial restricitons, Hypomobility, Decreased balance, Decreased mobility, Decreased coordination, Decreased scar mobility, Decreased safety awareness, Hypermobility, Difficulty walking, Decreased strength, Decreased range of motion, Decreased endurance, Abnormal gait, Decreased activity tolerance, Increased muscle spasms, Impaired flexibility  Visit Diagnosis: Myalgia  Unspecified lack of coordination  Other abnormalities of gait and mobility     Problem List Patient Active Problem List   Diagnosis Date Noted  . Lower limb length difference 02/27/2015  . Hypothyroidism 01/27/2015  . Chronic pelvic pain in female 01/27/2015  . Anxiety   . Rheumatoid arthritis ([place])   . Depression   . Fibromyalgia   . TMJ pain dysfunction syndrome   . Chronic bronchitis ([place])   . Chronic fatigue   . Chronic pain   . ADD (attention deficit disorder)   . Learning disability     Jerl Mina ,PT, DPT, E-RYT  07/02/2015, 10:38 PM  [place] MAIN [facility] SERVICES [address] [place], [place], [address] Phone: [phone]   Fax:  [phone]  Name: BERKLEE BATTEY MRN: [id number] Date of Birth: 07-Nov-1980

## 2015-07-03 LAB — THYROID PANEL WITH TSH
Free Thyroxine Index: 2.7 (ref 1.2–4.9)
T3 UPTAKE RATIO: 31 % (ref 24–39)
T4 TOTAL: 8.8 ug/dL (ref 4.5–12.0)
TSH: 1.84 u[IU]/mL (ref 0.450–4.500)

## 2015-07-05 ENCOUNTER — Encounter: Payer: Self-pay | Admitting: Physical Therapy

## 2015-07-05 ENCOUNTER — Telehealth: Payer: Self-pay

## 2015-07-05 NOTE — Telephone Encounter (Signed)
Patient called, she states that her medication that was given on Friday needs a prior auth, I think that it is Advair.

## 2015-07-05 NOTE — Telephone Encounter (Signed)
Thanks so much, will you contact the patient when the determination is made?

## 2015-07-05 NOTE — Telephone Encounter (Signed)
Patient notified of lab results.  Kristin Bruins can you please see about getting this approved

## 2015-07-05 NOTE — Telephone Encounter (Signed)
Prior Authorization submitted through Medicaid. It is pending review.   Will check back later on the status of the Prior Authorization.

## 2015-07-05 NOTE — Telephone Encounter (Signed)
Can you have her call her insurance and see what they will cover. Also, can you let her know that her thyroid came back nice and normal, including T3 and T4. Thanks.

## 2015-07-06 ENCOUNTER — Ambulatory Visit: Payer: Medicare Other | Admitting: Physical Therapy

## 2015-07-06 DIAGNOSIS — R2689 Other abnormalities of gait and mobility: Secondary | ICD-10-CM

## 2015-07-06 DIAGNOSIS — M791 Myalgia, unspecified site: Secondary | ICD-10-CM

## 2015-07-06 DIAGNOSIS — R279 Unspecified lack of coordination: Secondary | ICD-10-CM | POA: Diagnosis not present

## 2015-07-06 NOTE — Patient Instructions (Addendum)
Scar massage handout   Cross over stretch with L thigh over R, R hand gently pulling knee towards armpits (temporary until L hip pain resolves) 5 breaths x 5  Stop before straining muscles       Restorative pose:   Towel folded into thirds under armpits  Hands folded out by sides  10 min

## 2015-07-07 ENCOUNTER — Telehealth: Payer: Self-pay

## 2015-07-07 NOTE — Therapy (Signed)
[place] MAIN [facility] SERVICES [address] [place], [place], [address] Phone: [phone]   Fax:  [phone]  Physical Therapy Treatment  Patient Details  Name: Sharon Rodriguez MRN: [id number] Date of Birth: Aug 03, 1980 Referring Provider: Park Liter, DO   Encounter Date: 07/06/2015      PT End of Session - 07/07/15 2008    Visit Number 3   Number of Visits 12   Date for PT Re-Evaluation 09/20/15   Authorization Type 3 of 10   g code   Activity Tolerance Patient tolerated treatment well;No increased pain   Behavior During Therapy [facility] for tasks assessed/performed      Past Medical History  Diagnosis Date  . TMJ (dislocation of temporomandibular joint)   . Chronic fatigue   . ADD (attention deficit disorder)   . Sleep apnea   . Chronic bronchitis ([place])   . Chronic pain   . Learning disability   . Anxiety   . Rheumatoid arthritis ([place])   . Osteoarthritis   . Depression   . Fibromyalgia   . Thyroid disease   . Hypothyroidism 01/27/2015    Past Surgical History  Procedure Laterality Date  . Cesarean section      2    There were no vitals filed for this visit.      Subjective Assessment - 07/07/15 2004    Subjective Pt reported no back and neck pain for the past 3 days and has been able to walk from car to clinic feeling "ok". Pt noticed soreness from the exercises. Yesterday, pt noticed sharp pain in her pelvic floor area with sitting. This pain is intermittent. This pain is different from the sacroiliac pain which has improved.  Pt reports constipation and [place] Type    Pertinent History Pt recently received a osteopathic Tx from referring provider which relieves her pain now from days to 1 week but pt feels the travel to the clinic is difficult because the driving causes arm pain.                            Hx of C-section             [facility] PT Assessment - 07/07/15 2004    Observation/Other Assessments   Observations improbed posture with ulocked knees and pelvic neutral without cuing   Palpation   Spinal mobility decreased midback tensions   SI assessment  decreased tenderness,tensions at L PSIS, significantly less at Lateral border S4-5    Palpation comment decreased abdominal scar restriction post TX  no tensions at pelvic floor mm                  Pelvic Floor Special Questions - 07/06/15 1406    Exam Type Deferred  pt deferred to next visit           [place] Adult PT Treatment/Exercise - 07/07/15 2007    Neuro Re-ed    Neuro Re-ed Details  see pt instructions                     PT Long Term Goals - 07/06/15 1358    PT LONG TERM GOAL #1   Title Pt will decrease her score on PGQ from 65% to < 505 in order to return to ADLs   Time 12   Period Weeks   Status On-going   PT LONG TERM GOAL #2   Title  Pt will report decreased fatigue based on the FSS by 50% (6.7 pt -->3.7pt)   in order to have energy to improve QOL and have the energy to perform household chores an.     .   Time 12   Period Weeks   Status On-going   PT LONG TERM GOAL #3   Title Pt will demo IND with HEP to maintain  decreased mm spasms of upper neck, shoulders, back in order to better manage fibromyalgia a to  to take care of herself and hr family.     Time 12   Period Weeks   Status Partially Met   PT LONG TERM GOAL #4   Title Pt will demo more medially aligned coccyx, mobility along L lateral border of sacrum without tenderness to palpation in order to improve back pain    Time 12   Period Weeks   Status New   PT LONG TERM GOAL #5   Title Pt will report decreased use of stool softeners from 2x/week to 0x/ week for 1 week in order to have easier bowel movements naturally and decreased hemorrhoids   Time 12   Period Weeks   Status New               Plan - 07/07/15 2008    Clinical Impression Statement Pt continues to progress well with resolving LBP, decreased complaint  with her back and neck, and reported increased ability to walk from her car to clinic feeling better compared to previous sessions. Suspect pt's remaining complaint of sharpness in her pelvic floor area is associated with increased scar restrictions at lower abdomen. Pt showed decreased pelvic floor mm tensions through external palpation, indicating positive response to last Tx and compliance to HEP. Pt showed good carry over with postural alignment and appeared brighter in affect and more energized compared to previous sessions. Pt demo'd increased scar mobility after Tx.  Pt continues to benefit from skilled PT.   Rehab Potential Good   PT Frequency 2x / week   PT Duration 12 weeks   PT Treatment/Interventions ADLs/Self Care Home Management;Aquatic Therapy;Moist Heat;Electrical Stimulation;Gait training;Traction;Stair training;Functional mobility training;Therapeutic activities;Therapeutic exercise;Neuromuscular re-education;Balance training;Patient/family education;Manual techniques;Scar mobilization;Dry needling;Taping;Splinting;Energy conservation   Consulted and Agree with Plan of Care Patient      Patient will benefit from skilled therapeutic intervention in order to improve the following deficits and impairments:  Obesity, Postural dysfunction, Improper body mechanics, Pain, Increased fascial restricitons, Hypomobility, Decreased balance, Decreased mobility, Decreased coordination, Decreased scar mobility, Decreased safety awareness, Hypermobility, Difficulty walking, Decreased strength, Decreased range of motion, Decreased endurance, Abnormal gait, Decreased activity tolerance, Increased muscle spasms, Impaired flexibility  Visit Diagnosis: Myalgia  Unspecified lack of coordination  Other abnormalities of gait and mobility     Problem List Patient Active Problem List   Diagnosis Date Noted  . Lower limb length difference 02/27/2015  . Hypothyroidism 01/27/2015  . Chronic pelvic  pain in female 01/27/2015  . Anxiety   . Rheumatoid arthritis ([place])   . Depression   . Fibromyalgia   . TMJ pain dysfunction syndrome   . Chronic bronchitis ([place])   . Chronic fatigue   . Chronic pain   . ADD (attention deficit disorder)   . Learning disability     Jerl Mina ,PT, DPT, E-RYT  07/07/2015, 8:10 PM  [place] MAIN [facility] SERVICES [address] [place], [place], [address] Phone: [phone]   Fax:  [phone]  Name: Sharon Rodriguez  MRN: [id number] Date of Birth: [birth year]

## 2015-07-07 NOTE — Telephone Encounter (Signed)
Medicaid denied patient's Advair.  Reason: "Patient is enrolled in a Medicare Part D Plan. As a payer of last resort medicaid will only pay for those medications that are excluded under Medicare Part D."  I tried calling patient. She needs to file this drug under her Medicare Part D, not her medicaid. Medicare Part D should pay for this medication. She did not answer. I left a message for patient to return my phone call.

## 2015-07-08 ENCOUNTER — Telehealth: Payer: Self-pay

## 2015-07-08 NOTE — Telephone Encounter (Signed)
Received a voicemail from patient about doing an appeal, I returned patients call to notify her that she needed to have the pharmacy run the medication through Medicare first.

## 2015-07-08 NOTE — Telephone Encounter (Signed)
Patient states that she does not have Medicaid and that they did submit it through Medicare.

## 2015-07-12 DIAGNOSIS — G4730 Sleep apnea, unspecified: Secondary | ICD-10-CM | POA: Diagnosis not present

## 2015-07-13 NOTE — Telephone Encounter (Signed)
Finally figured out the right insurance to file the PA through. Patient DOES have Medicaid, however, they replied stating they are a payer of last resort.  Patient also has CMS medicare part A and part B--which should cover advair.   Patient ALSO has Medicare Part D [employer]. This is what the patient files her medications through, but we did not have it on file. Prior Authorization submitted to [employer].

## 2015-07-14 MED ORDER — UMECLIDINIUM-VILANTEROL 62.5-25 MCG/INH IN AEPB: 1.0000 | INHALATION_SPRAY | Freq: Every day | RESPIRATORY_TRACT | Status: DC

## 2015-07-14 NOTE — Telephone Encounter (Signed)
Called and left a message to let patient know that medication was sent to the pharmacy.

## 2015-07-14 NOTE — Telephone Encounter (Signed)
Anoro sent to her pharmacy. She can try that and if she fails we can try the advair

## 2015-07-14 NOTE — Telephone Encounter (Signed)
Insurance would like patient to try the covered drugs: Anoro Ellipta Breo Ellipta Serevent Diskus Spiriva  [employer]

## 2015-07-16 ENCOUNTER — Telehealth: Payer: Self-pay | Admitting: Family Medicine

## 2015-07-16 DIAGNOSIS — W57XXXA Bitten or stung by nonvenomous insect and other nonvenomous arthropods, initial encounter: Secondary | ICD-10-CM

## 2015-07-16 DIAGNOSIS — J029 Acute pharyngitis, unspecified: Secondary | ICD-10-CM | POA: Diagnosis not present

## 2015-07-16 DIAGNOSIS — A938 Other specified arthropod-borne viral fevers: Secondary | ICD-10-CM | POA: Diagnosis not present

## 2015-07-16 DIAGNOSIS — Z112 Encounter for screening for other bacterial diseases: Secondary | ICD-10-CM | POA: Diagnosis not present

## 2015-07-16 DIAGNOSIS — R509 Fever, unspecified: Secondary | ICD-10-CM

## 2015-07-16 MED ORDER — DOXYCYCLINE HYCLATE 100 MG PO TABS: 100.0000 mg | ORAL_TABLET | Freq: Two times a day (BID) | ORAL | Status: DC

## 2015-07-16 NOTE — Telephone Encounter (Signed)
Patient will come in Monday and have lab work done.

## 2015-07-16 NOTE — Telephone Encounter (Signed)
Patient notified

## 2015-07-16 NOTE — Telephone Encounter (Signed)
Pt called stated she is running a fever of 103.9 and was told Dr. Wynetta Emery to notify of any symptoms after her tick bite and Dr. Wynetta Emery would call something in. Pharm is [employer] libr[employer] in [place]. Thanks.

## 2015-07-16 NOTE — Telephone Encounter (Signed)
Forward to provider

## 2015-07-16 NOTE — Telephone Encounter (Signed)
Orders put in. I put in a Rx for doxycycline in case it's lyme or RMSF- if fever doesn't break, she needs to go to ER/Urgent care to be evaluated.

## 2015-07-16 NOTE — Telephone Encounter (Signed)
If she'd like to have blood work, she can stop by and get it today or tomorrow, otherwise we can treat her, but that means we won't know which one she has or if this is a cold/virus

## 2015-07-18 DIAGNOSIS — Z825 Family history of asthma and other chronic lower respiratory diseases: Secondary | ICD-10-CM | POA: Diagnosis not present

## 2015-07-18 DIAGNOSIS — J209 Acute bronchitis, unspecified: Secondary | ICD-10-CM | POA: Diagnosis not present

## 2015-07-18 DIAGNOSIS — H6591 Unspecified nonsuppurative otitis media, right ear: Secondary | ICD-10-CM | POA: Diagnosis not present

## 2015-07-18 DIAGNOSIS — R112 Nausea with vomiting, unspecified: Secondary | ICD-10-CM | POA: Diagnosis not present

## 2015-07-18 DIAGNOSIS — F900 Attention-deficit hyperactivity disorder, predominantly inattentive type: Secondary | ICD-10-CM | POA: Diagnosis not present

## 2015-07-18 DIAGNOSIS — M797 Fibromyalgia: Secondary | ICD-10-CM | POA: Diagnosis not present

## 2015-07-18 DIAGNOSIS — J40 Bronchitis, not specified as acute or chronic: Secondary | ICD-10-CM | POA: Diagnosis not present

## 2015-07-18 DIAGNOSIS — R05 Cough: Secondary | ICD-10-CM | POA: Diagnosis not present

## 2015-07-18 DIAGNOSIS — R0981 Nasal congestion: Secondary | ICD-10-CM | POA: Diagnosis not present

## 2015-07-20 ENCOUNTER — Encounter: Payer: Medicare Other | Admitting: Physical Therapy

## 2015-07-20 DIAGNOSIS — F411 Generalized anxiety disorder: Secondary | ICD-10-CM | POA: Diagnosis not present

## 2015-07-20 DIAGNOSIS — F900 Attention-deficit hyperactivity disorder, predominantly inattentive type: Secondary | ICD-10-CM | POA: Diagnosis not present

## 2015-07-20 DIAGNOSIS — F330 Major depressive disorder, recurrent, mild: Secondary | ICD-10-CM | POA: Diagnosis not present

## 2015-07-22 ENCOUNTER — Encounter: Payer: Self-pay | Admitting: Family Medicine

## 2015-07-22 ENCOUNTER — Ambulatory Visit (INDEPENDENT_AMBULATORY_CARE_PROVIDER_SITE_OTHER): Payer: Medicare Other | Admitting: Family Medicine

## 2015-07-22 VITALS — BP 92/67 | HR 74 | Temp 98.8°F | Wt 157.0 lb

## 2015-07-22 DIAGNOSIS — J42 Unspecified chronic bronchitis: Secondary | ICD-10-CM | POA: Diagnosis not present

## 2015-07-22 DIAGNOSIS — R509 Fever, unspecified: Secondary | ICD-10-CM

## 2015-07-22 DIAGNOSIS — T148 Other injury of unspecified body region: Secondary | ICD-10-CM | POA: Diagnosis not present

## 2015-07-22 DIAGNOSIS — J209 Acute bronchitis, unspecified: Secondary | ICD-10-CM | POA: Diagnosis not present

## 2015-07-22 DIAGNOSIS — W57XXXA Bitten or stung by nonvenomous insect and other nonvenomous arthropods, initial encounter: Secondary | ICD-10-CM | POA: Diagnosis not present

## 2015-07-22 DIAGNOSIS — B370 Candidal stomatitis: Secondary | ICD-10-CM

## 2015-07-22 MED ORDER — MAGIC MOUTHWASH: 5.0000 mL | Freq: Three times a day (TID) | ORAL | Status: DC

## 2015-07-22 MED ORDER — ALBUTEROL SULFATE (2.5 MG/3ML) 0.083% IN NEBU: 2.5000 mg | INHALATION_SOLUTION | Freq: Once | RESPIRATORY_TRACT | Status: DC

## 2015-07-22 MED ORDER — ALBUTEROL SULFATE (2.5 MG/3ML) 0.083% IN NEBU: INHALATION_SOLUTION | RESPIRATORY_TRACT | Status: DC

## 2015-07-22 MED ORDER — DOXYCYCLINE HYCLATE 100 MG PO TABS: 100.0000 mg | ORAL_TABLET | Freq: Two times a day (BID) | ORAL | Status: DC

## 2015-07-22 NOTE — Assessment & Plan Note (Signed)
Will treat with doxycycline, continue prednisone, start anoro, samples given today. Neb rx given. Call with concerns. Recheck lungs 2 weeks

## 2015-07-22 NOTE — Progress Notes (Signed)
BP 92/67 mmHg  Pulse 74  Temp(Src) 98.8 F (37.1 C)  Wt 157 lb (71.215 kg)  SpO2 99%  LMP 07/21/2015 (Approximate)   Subjective:    Patient ID: Sharon Rodriguez, female    DOB: 10-26-1980, 35 y.o.   MRN: [id number]  HPI: Sharon Rodriguez is a 35 y.o. female  Chief Complaint  Patient presents with  . Cough    Patient has started OTC mucinex  . Numbness    mouth and tongue   UPPER RESPIRATORY TRACT INFECTION- hasn't started the anoro, hasn't been able to use the inaler Worst symptom: coughing Fever: yes Cough: yes Shortness of breath: yes Wheezing: yes Chest pain: yes, with cough Chest tightness: yes Chest congestion: yes Nasal congestion: yes Runny nose: yes Post nasal drip: no Sneezing: yes Sore throat: yes Swollen glands: yes Sinus pressure: yes Headache: yes Face pain: yes Toothache: no Ear pain: yes "right Ear pressure: yes "right Eyes red/itching:yes Eye drainage/crusting: no  Vomiting: yes Rash: no Fatigue: yes Sick contacts: no Strep contacts: no  Context: better Recurrent sinusitis: no Relief with OTC cold/cough medications: yes  Treatments attempted: mucinex and prednisone   Relevant past medical, surgical, family and social history reviewed and updated as indicated. Interim medical history since our last visit reviewed. Allergies and medications reviewed and updated.  Review of Systems  Constitutional: Negative.   HENT: Positive for congestion, ear pain, rhinorrhea, sinus pressure, sneezing and sore throat. Negative for dental problem, drooling, ear discharge, facial swelling, hearing loss, mouth sores, nosebleeds, postnasal drip, tinnitus, trouble swallowing and voice change.   Respiratory: Positive for cough, chest tightness, shortness of breath and wheezing. Negative for apnea and stridor.   Cardiovascular: Negative.   Musculoskeletal: Positive for myalgias.  Neurological: Positive for numbness.  Psychiatric/Behavioral: Negative.     Per  HPI unless specifically indicated above     Objective:    BP 92/67 mmHg  Pulse 74  Temp(Src) 98.8 F (37.1 C)  Wt 157 lb (71.215 kg)  SpO2 99%  LMP 07/21/2015 (Approximate)  Wt Readings from Last 3 Encounters:  07/22/15 157 lb (71.215 kg)  07/02/15 160 lb (72.576 kg)  04/30/15 159 lb (72.122 kg)    Physical Exam  Constitutional: She is oriented to person, place, and time. She appears well-developed and well-nourished. No distress.  HENT:  Head: Normocephalic and atraumatic.  Right Ear: Hearing and external ear normal.  Left Ear: Hearing and external ear normal.  Nose: Nose normal.  Mouth/Throat: Oropharynx is clear and moist. No oropharyngeal exudate.  Eyes: Conjunctivae, EOM and lids are normal. Pupils are equal, round, and reactive to light. Right eye exhibits no discharge. Left eye exhibits no discharge. No scleral icterus.  Neck: Normal range of motion. Neck supple. No JVD present. No tracheal deviation present. No thyromegaly present.  Cardiovascular: Normal rate, regular rhythm, normal heart sounds and intact distal pulses.  Exam reveals no gallop and no friction rub.   No murmur heard. Pulmonary/Chest: Effort normal. No stridor. No respiratory distress. She has wheezes. She has no rales. She exhibits no tenderness.  Musculoskeletal: Normal range of motion.  Lymphadenopathy:    She has cervical adenopathy.  Neurological: She is alert and oriented to person, place, and time.  Skin: Skin is warm, dry and intact. No rash noted. She is not diaphoretic. No erythema. No pallor.  Psychiatric: She has a normal mood and affect. Her speech is normal and behavior is normal. Judgment and thought content normal. Cognition and memory are  normal.  Nursing note and vitals reviewed.   Results for orders placed or performed in visit on 07/02/15  Thyroid Panel With TSH  Result Value Ref Range   TSH 1.840 0.450 - 4.500 uIU/mL   T4, Total 8.8 4.5 - 12.0 ug/dL   T3 Uptake Ratio 31 24 - 39  %   Free Thyroxine Index 2.7 1.2 - 4.9      Assessment & Plan:   Problem List Items Addressed This Visit      Respiratory   Chronic bronchitis ([place]) - Primary    Will treat with doxycycline, continue prednisone, start anoro, samples given today. Neb rx given. Call with concerns. Recheck lungs 2 weeks      Relevant Medications   albuterol (PROVENTIL) (2.5 MG/3ML) 0.083% nebulizer solution 2.5 mg (Start on 07/22/2015  9:15 AM)    Other Visit Diagnoses    Tick bite        Will check for tick bourne illnesses. Await results. Treat empirically with doxy.    Fever, unspecified fever cause        Will treat with doxycycline. Continue to monitor. Call if not getting better or getting worse.     Thrush        Will treat with magic mouth wash. Call if not getting better or getting worse.     Relevant Medications    magic mouthwash SOLN        Follow up plan: Return in about 2 weeks (around 08/05/2015) for Lung recheck with spiro.

## 2015-07-27 ENCOUNTER — Ambulatory Visit: Payer: Medicare Other | Admitting: Physical Therapy

## 2015-07-27 LAB — EHRLICHIA ANTIBODY PANEL
E. CHAFFEENSIS (HME) IGM TITER: NEGATIVE
E. CHAFFEENSIS IGG AB: NEGATIVE
HGE IGM TITER: NEGATIVE
HGE IgG Titer: NEGATIVE

## 2015-07-27 LAB — LYME AB/WESTERN BLOT REFLEX: Lyme IgG/IgM Ab: <0.91 {ISR} (ref 0.00–0.90)

## 2015-07-27 LAB — ROCKY MTN SPOTTED FVR ABS PNL(IGG+IGM)
RMSF IGG: POSITIVE — AB
RMSF IGM: 0.39 {index} (ref 0.00–0.89)

## 2015-07-27 LAB — RMSF, IGG, IFA: RMSF, IGG, IFA: <1:64 {titer}

## 2015-07-27 LAB — BABESIA MICROTI ANTIBODY PANEL
Babesia microti IgG: <1:10 {titer}
Babesia microti IgM: <1:10 {titer}

## 2015-07-28 ENCOUNTER — Ambulatory Visit: Payer: Medicare Other | Admitting: Physical Therapy

## 2015-07-28 ENCOUNTER — Telehealth: Payer: Self-pay | Admitting: Family Medicine

## 2015-07-28 ENCOUNTER — Encounter: Payer: Self-pay | Admitting: Family Medicine

## 2015-07-28 DIAGNOSIS — J209 Acute bronchitis, unspecified: Secondary | ICD-10-CM | POA: Diagnosis not present

## 2015-07-28 DIAGNOSIS — J029 Acute pharyngitis, unspecified: Secondary | ICD-10-CM | POA: Diagnosis not present

## 2015-07-28 DIAGNOSIS — R5383 Other fatigue: Secondary | ICD-10-CM | POA: Diagnosis not present

## 2015-07-28 NOTE — Telephone Encounter (Signed)
Pt called complaining about having thrush, still having trouble breathing and has been exposed to mono.  Wants CMA to call her regarding these issues.

## 2015-07-28 NOTE — Telephone Encounter (Signed)
Left message to call. Also received a fax from [facility] for nebulizer supplies, did patient request them?

## 2015-07-29 MED ORDER — FLUCONAZOLE 150 MG PO TABS: 150.0000 mg | ORAL_TABLET | Freq: Every day | ORAL | Status: DC

## 2015-07-29 NOTE — Telephone Encounter (Signed)
I spoke with her, she states they are supposed to send [place] her records from yesterday. She is going to stop the antibiotic Dr.Johnson prescribed her and start the new one. She said that the other clinic prescribed her Diflucan yesterday for her thrush. I advised her to just use the rx from yesterday then. She wants to wait and see how she does after this round of antibiotics and if no improvement, then she'll call back and ask for a referral to pulm.

## 2015-07-29 NOTE — Telephone Encounter (Signed)
Patient states that her daughter was diagnosed with mono. The dukes magic mouth wash isn't helping her thrush, is there something else she can take? She was still feeling sick so she went to another doctor yesterday since she couldn't be seen here [facility] clinic?) they tested her for mono and also changed her antibiotic to something that starts with a Z. She wasn't sure what it was because she hasn't picked it up yet.

## 2015-07-29 NOTE — Telephone Encounter (Signed)
If we can get the records from that other clinic that'd be great.  I've sent through a pill for her thrush, she should let me know if it doesn't help.  If she feels like her lungs are doing no better on the medicine, we can send her to pulmonology. Let me know if she wants to do that and I'll put in the referral. She can take the zithromax (which is probably what they gave her) or she can finish her doxycycline- both treat bronchitis and pneumonia. It's up to her.

## 2015-08-03 ENCOUNTER — Ambulatory Visit: Payer: Medicare Other | Admitting: Physical Therapy

## 2015-08-04 ENCOUNTER — Ambulatory Visit: Payer: Medicare Other | Admitting: Family Medicine

## 2015-08-04 ENCOUNTER — Telehealth: Payer: Self-pay | Admitting: Family Medicine

## 2015-08-04 MED ORDER — LIDOCAINE 5 % EX OINT: 1.0000 "application " | TOPICAL_OINTMENT | Freq: Four times a day (QID) | CUTANEOUS | Status: DC | PRN

## 2015-08-04 NOTE — Telephone Encounter (Signed)
Patient sent fax requesting topical lidocaine and back brace. Back brace not appropriate and refused. Lidocaine approved and sent in.

## 2015-08-06 ENCOUNTER — Ambulatory Visit: Payer: Medicare Other | Attending: Family Medicine | Admitting: Physical Therapy

## 2015-08-06 DIAGNOSIS — M791 Myalgia, unspecified site: Secondary | ICD-10-CM

## 2015-08-06 DIAGNOSIS — R2689 Other abnormalities of gait and mobility: Secondary | ICD-10-CM | POA: Insufficient documentation

## 2015-08-06 DIAGNOSIS — R279 Unspecified lack of coordination: Secondary | ICD-10-CM | POA: Insufficient documentation

## 2015-08-06 NOTE — Therapy (Signed)
[place] MAIN [facility] SERVICES [address] [place], [place], [address] Phone: [phone]   Fax:  [phone]  Physical Therapy Treatment  Patient Details  Name: Sharon Rodriguez MRN: [id number] Date of Birth: Oct 15, 1980 Referring Provider: Park Liter, DO   Encounter Date: 08/06/2015      PT End of Session - 08/06/15 1731    Visit Number 4   Number of Visits 12   Date for PT Re-Evaluation 09/20/15   Authorization Type 4 of 10   g code   PT Start Time 1010   PT Stop Time 1108   PT Time Calculation (min) 58 min   Activity Tolerance Patient tolerated treatment well;No increased pain   Behavior During Therapy [facility] for tasks assessed/performed      Past Medical History  Diagnosis Date  . TMJ (dislocation of temporomandibular joint)   . Chronic fatigue   . ADD (attention deficit disorder)   . Sleep apnea   . Chronic bronchitis ([place])   . Chronic pain   . Learning disability   . Anxiety   . Rheumatoid arthritis ([place])   . Osteoarthritis   . Depression   . Fibromyalgia   . Thyroid disease   . Hypothyroidism 01/27/2015    Past Surgical History  Procedure Laterality Date  . Cesarean section      2    There were no vitals filed for this visit.      Subjective Assessment - 08/06/15 1013    Subjective Pt reported her exercises have been helping, especially with the frog stretch. The massaging has alot to do with helping her pelvis. When it was raining, she had pain with R knee. 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  Pt stated a previous manual PT helped her alot. Pt also had to miss PT sessions due to Dx of [facility] Spotted Fever.  Pt continues to feel drained. Pt's sleep study was fine (negative).     Pertinent History Pt recently received a osteopathic Tx from referring provider which relieves her pain now from days to 1 week but pt feels the travel to the clinic is difficult  because the driving causes arm pain.                            Hx of C-section             [facility] PT Assessment - 08/06/15 1029    Palpation   Spinal mobility decreased mm tensions   SI assessment  L PSIS more posterior and tender                      [facility] Adult PT Treatment/Exercise - 08/06/15 1723    Therapeutic Activites    Therapeutic Activities --  see pt instructions   Neuro Re-ed    Neuro Re-ed Details  see pt instructions   Exercises   Exercises --  see pt instructions                PT Education - 08/06/15 1730    Education provided Yes   Education Details HEP   Person(s) Educated Patient   Methods Demonstration;Tactile cues;Verbal cues;Handout;Explanation   Comprehension Verbalized understanding;Returned demonstration             PT Long Term Goals - 08/06/15 1017    PT LONG TERM GOAL #1  Title Pt will decrease her score on PGQ from 65% to < 50% in order to return to ADLs   Time 12   Period Weeks   Status On-going   PT LONG TERM GOAL #2   Title Pt will report decreased fatigue based on the FSS by 50% (6.7 pt -->3.7pt)   in order to have energy to improve QOL and have the energy to perform household chores an.     .   Time 12   Period Weeks   Status On-going   PT LONG TERM GOAL #3   Title Pt will demo IND with HEP to maintain  decreased mm spasms of upper neck, shoulders, back in order to better manage fibromyalgia a to  to take care of herself and hr family.     Time 12   Period Weeks   Status Partially Met   PT LONG TERM GOAL #4   Title Pt will demo more medially aligned coccyx, mobility along L lateral border of sacrum without tenderness to palpation in order to improve back pain    Time 12   Period Weeks   Status Partially Met   PT LONG TERM GOAL #5   Title Pt will report decreased use of stool softeners from 2x/week to 0x/ week for 1 week in order to have easier bowel movements naturally and decreased hemorrhoids    Time 12   Period Weeks   Status On-going               Plan - 08/06/15 1732    Clinical Impression Statement Despite missing PT for 3 weeks due to sickness, pt showed good carry over from past sessions. Pt also reported she had not done alot due to sickness and when she returned to some of her activities, pt was able to self-manage her pain.  Pt progressed to deep core strengthening and demo'd proper standing techniques with bee-keeper to minimize back pain. Pt was also educated on body scan technique and pain science education.  Pt is progressing well towards her goals.     Rehab Potential Good   PT Frequency 2x / week   PT Duration 12 weeks   PT Treatment/Interventions ADLs/Self Care Home Management;Aquatic Therapy;Moist Heat;Electrical Stimulation;Gait training;Traction;Stair training;Functional mobility training;Therapeutic activities;Therapeutic exercise;Neuromuscular re-education;Balance training;Patient/family education;Manual techniques;Scar mobilization;Dry needling;Taping;Splinting;Energy conservation   Consulted and Agree with Plan of Care Patient      Patient will benefit from skilled therapeutic intervention in order to improve the following deficits and impairments:  Obesity, Postural dysfunction, Improper body mechanics, Pain, Increased fascial restricitons, Hypomobility, Decreased balance, Decreased mobility, Decreased coordination, Decreased scar mobility, Decreased safety awareness, Hypermobility, Difficulty walking, Decreased strength, Decreased range of motion, Decreased endurance, Abnormal gait, Decreased activity tolerance, Increased muscle spasms, Impaired flexibility  Visit Diagnosis: Myalgia  Unspecified lack of coordination  Other abnormalities of gait and mobility     Problem List Patient Active Problem List   Diagnosis Date Noted  . Lower limb length difference 02/27/2015  . Hypothyroidism 01/27/2015  . Chronic pelvic pain in female 01/27/2015  .  Anxiety   . Rheumatoid arthritis ([place])   . Depression   . Fibromyalgia   . TMJ pain dysfunction syndrome   . Chronic bronchitis ([place])   . Chronic fatigue   . Chronic pain   . ADD (attention deficit disorder)   . Learning disability     Jerl Mina ,PT, DPT, E-RYT  08/06/2015, 5:44 PM  [place] MAIN [facility] SERVICES  [place], [place], [address] Phone: [phone]   Fax:  [phone]  Name: Sharon Rodriguez MRN: [id number] Date of Birth: 07/12/1980

## 2015-08-06 NOTE — Patient Instructions (Addendum)
You are now ready to begin training the deep core muscles system: diaphragm, transverse abdominis, pelvic floor . These muscles must work together as a team.        The key to these exercises to train the brain to coordinate the timing of these muscles and to have them turn on for long periods of time to hold you upright against gravity (especially important if you are on your feet all day).These muscles are postural muscles and play a role stabilizing your spine and bodyweight. By doing these repetitions slowly and correctly instead of doing crunches, you will achieve a flatter belly without a lower pooch. You are also placing your spine in a more neutral position and breathing properly which in turn, decreases your risk for problems related to your pelvic floor, abdominal, and low back such as pelvic organ prolapse, hernias, diastasis recti (separation of superficial muscles), disk herniations, spinal fractures. These exercises set a solid foundation for you to later progress to resistance/ strength training with therabands and weights and return to other typical fitness exercises with a stronger deeper core.   Level 2 (30 reps x 2 day)      "w" exercise with yellow band  10 x 2 sets x 2 day    Body scan technique every night before bed or one time during the day for relaxation    Staggered stance to decrease back pain at the bed hive

## 2015-08-10 ENCOUNTER — Ambulatory Visit: Payer: Medicare Other | Admitting: Physical Therapy

## 2015-08-13 ENCOUNTER — Ambulatory Visit: Payer: Medicare Other | Attending: Family Medicine | Admitting: Physical Therapy

## 2015-08-13 DIAGNOSIS — M791 Myalgia, unspecified site: Secondary | ICD-10-CM

## 2015-08-13 DIAGNOSIS — R279 Unspecified lack of coordination: Secondary | ICD-10-CM | POA: Insufficient documentation

## 2015-08-13 DIAGNOSIS — R2689 Other abnormalities of gait and mobility: Secondary | ICD-10-CM | POA: Diagnosis not present

## 2015-08-13 DIAGNOSIS — M25561 Pain in right knee: Secondary | ICD-10-CM | POA: Diagnosis not present

## 2015-08-13 NOTE — Therapy (Signed)
[place] MAIN [facility] SERVICES [address] [place], [place], [address] Phone: [phone]   Fax:  [phone]  Physical Therapy Treatment [facility] [facility] Note  Patient Details  Name: Sharon Rodriguez MRN: [id number] Date of Birth: 22-Sep-1980 Referring Provider: Park Liter, DO   Encounter Date: 08/13/2015      PT End of Session - 08/13/15 1314    Visit Number 5   Number of Visits 12   Date for PT Re-Evaluation 09/20/15   Authorization Type 5 of 10   g code   PT Start Time 1110   PT Stop Time 1211   PT Time Calculation (min) 61 min   Activity Tolerance Patient tolerated treatment well;No increased pain   Behavior During Therapy [facility] for tasks assessed/performed      Past Medical History  Diagnosis Date  . TMJ (dislocation of temporomandibular joint)   . Chronic fatigue   . ADD (attention deficit disorder)   . Sleep apnea   . Chronic bronchitis ([place])   . Chronic pain   . Learning disability   . Anxiety   . Rheumatoid arthritis ([place])   . Osteoarthritis   . Depression   . Fibromyalgia   . Thyroid disease   . Hypothyroidism 01/27/2015    Past Surgical History  Procedure Laterality Date  . Cesarean section      2    There were no vitals filed for this visit.      Subjective Assessment - 08/13/15 1200    Subjective Pt reported she felt fine in her back after PT last week. Pt used the corrective pelvic girdle exercise a few days after PT and has not had any back issues. 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  Pt practiced standing technique and took rest breaks as suggested during the activity. Pt states her energy level has improved for the past few days and she was able to do 5 days worth of activities within the same day while taking rest breaks. Pt reports her R knee bothers her constantly for the past month.             [facility] PT Assessment -  08/13/15 1202    Strength   Overall Strength Comments LE strength 4/5 B   Palpation   SI assessment  PSIS symmetrical with minor tenderness   Palpation comment scar restrictions R > L along LQ    Special Tests    Special Tests --  no deviations on step down test (no genu valgus noted on R)    Ambulation/Gait   Gait Comments R foot pronation                      [facility] Adult PT Treatment/Exercise - 08/13/15 1203    Exercises   Exercises --  see pt instructions    Manual Therapy   Manual therapy comments abdominal scar massage                 PT Education - 08/13/15 1200    Education provided Yes   Education Details HEP   Person(s) Educated Patient   Methods Explanation;Demonstration;Tactile cues;Verbal cues;Handout   Comprehension Returned demonstration;Verbalized understanding             PT Long Term Goals - 08/13/15 1121    PT LONG TERM GOAL #1   Title Pt will decrease her score on  PGQ from 65% to < 50% in order to return to ADLs (6/2: 31%)    Time 12   Period Weeks   Status Achieved   PT LONG TERM GOAL #2   Title Pt will report decreased fatigue based on the FSS by 50% (6.7 pt -->3.7pt)   in order to have energy to improve QOL and have the energy to perform household chores     .  (6/2:  FSS 47.6% ( VAFS 5pt)    Time 12   Period Weeks   Status On-going   PT LONG TERM GOAL #3   Title Pt will demo IND with HEP to maintain  decreased mm spasms of upper neck, shoulders, back in order to better manage fibromyalgia a to  to take care of herself and hr family.     Time 12   Period Weeks   Status Achieved   PT LONG TERM GOAL #4   Title Pt will demo more medially aligned coccyx, mobility along L lateral border of sacrum without tenderness to palpation in order to improve back pain    Time 12   Period Weeks   Status Achieved   PT LONG TERM GOAL #5   Title Pt will report decreased use of stool softeners from 2x/week to 0x/ week for 1 week in order  to have easier bowel movements naturally and decreased hemorrhoids   Time 12   Period Weeks   Status On-going   Additional Long Term Goals   Additional Long Term Goals Yes   PT LONG TERM GOAL #6   Title Pt will complete an food diary in order to improve stool consistency from Type 1 to 3-4.    Time 12   Period Weeks   Status New               Plan - 08/13/15 1302    Clinical Impression Statement Pt has achieved 3/6 goals across the past 5 visits. Her Pelvic Girdle Questionnaire score decreased from 65% to 31% as she continues to maintain corrected pelvic girdle and coccyx alignment. Pt also shows decreased mm tensions as she remains compliant with her HEP and body mechanics training. Although her fatigue scale does not show signifcany change, she reports her energy level has improved. 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 Pt does complain of constant R knee pain which started a few weeks ago. This re-cert includes this new Dx of R knee pain and PT plans to assess in greater detail at next session. Thank you for your referral and I will continue to update you on her progress.     Rehab Potential Good   PT Frequency 1x / week   PT Duration 12 weeks   PT Treatment/Interventions ADLs/Self Care Home Management;Aquatic Therapy;Moist Heat;Electrical Stimulation;Gait training;Traction;Stair training;Functional mobility training;Therapeutic activities;Therapeutic exercise;Neuromuscular re-education;Balance training;Patient/family education;Manual techniques;Scar mobilization;Dry needling;Taping;Splinting;Energy conservation   Consulted and Agree with Plan of Care Patient      Patient will benefit from skilled therapeutic intervention in order to improve the following deficits and  impairments:  Obesity, Postural dysfunction, Improper body mechanics, Pain, Increased fascial restricitons, Hypomobility, Decreased balance, Decreased mobility, Decreased coordination, Decreased scar mobility, Decreased safety awareness, Hypermobility, Difficulty walking, Decreased strength, Decreased range of motion, Decreased endurance, Abnormal gait, Decreased activity tolerance,  Increased muscle spasms, Impaired flexibility  Visit Diagnosis: Myalgia  Unspecified lack of coordination  Other abnormalities of gait and mobility  Pain in right knee     Problem List Patient Active Problem List   Diagnosis Date Noted  . Lower limb length difference 02/27/2015  . Hypothyroidism 01/27/2015  . Chronic pelvic pain in female 01/27/2015  . Anxiety   . Rheumatoid arthritis ([place] )   . Depression   . Fibromyalgia   . TMJ pain dysfunction syndrome   . Chronic bronchitis ([place])   . Chronic fatigue   . Chronic pain   . ADD (attention deficit disorder)   . Learning disability     Jerl Mina ,PT, DPT, E-RYT  08/13/2015, 1:15 PM  [place] MAIN [facility] SERVICES [address] [place], [place], [address] Phone: [phone]   Fax:  [phone]  Name: Sharon Rodriguez MRN: [id number] Date of Birth: 1980-11-15

## 2015-08-13 NOTE — Patient Instructions (Signed)
Prone Heel Press for strengthening sacro-iliac joints  1. Lie on your belly. If you have an arch in your low back or it feels umcomfortable, place a pillow under your low belly/hips to make sure your low back feel comfortable.   2. Place our forehead on top of your palms.      Widen your knees apart for starting position.   3. Inhale, feel belly and low back expand  4. Exhale, feel belly hug in, press heel together and count aloud for 5 sec. Then relax the heel squeezing.  Perform 10 reps of 5 sec holds. 2 sets/ day.    If you feel entire buttock tighten too much or feel low back pain, apply 50% less effort. As you press your heel together, you will feel as if your pubic bone (front of your pelvis) and sacrum (back of your pelvis) gentle move towards each other or your low abdominal muscles hug in more.        Deep core level 2 (30 reps) knee out 30 reps x 2     __________________  Continue to practice taking rest breaks with activities.

## 2015-09-03 ENCOUNTER — Ambulatory Visit (INDEPENDENT_AMBULATORY_CARE_PROVIDER_SITE_OTHER): Payer: Medicare Other | Admitting: Family Medicine

## 2015-09-03 ENCOUNTER — Encounter: Payer: Self-pay | Admitting: Family Medicine

## 2015-09-03 VITALS — BP 141/81 | HR 99 | Temp 98.2°F | Wt 156.0 lb

## 2015-09-03 DIAGNOSIS — N949 Unspecified condition associated with female genital organs and menstrual cycle: Secondary | ICD-10-CM

## 2015-09-03 DIAGNOSIS — M9902 Segmental and somatic dysfunction of thoracic region: Secondary | ICD-10-CM | POA: Diagnosis not present

## 2015-09-03 DIAGNOSIS — M9908 Segmental and somatic dysfunction of rib cage: Secondary | ICD-10-CM | POA: Diagnosis not present

## 2015-09-03 DIAGNOSIS — M25561 Pain in right knee: Secondary | ICD-10-CM

## 2015-09-03 DIAGNOSIS — B001 Herpesviral vesicular dermatitis: Secondary | ICD-10-CM | POA: Diagnosis not present

## 2015-09-03 DIAGNOSIS — M9909 Segmental and somatic dysfunction of abdomen and other regions: Secondary | ICD-10-CM | POA: Diagnosis not present

## 2015-09-03 DIAGNOSIS — B370 Candidal stomatitis: Secondary | ICD-10-CM | POA: Diagnosis not present

## 2015-09-03 DIAGNOSIS — M9905 Segmental and somatic dysfunction of pelvic region: Secondary | ICD-10-CM | POA: Diagnosis not present

## 2015-09-03 DIAGNOSIS — M999 Biomechanical lesion, unspecified: Secondary | ICD-10-CM

## 2015-09-03 DIAGNOSIS — G8929 Other chronic pain: Secondary | ICD-10-CM | POA: Diagnosis not present

## 2015-09-03 DIAGNOSIS — M489 Spondylopathy, unspecified: Secondary | ICD-10-CM

## 2015-09-03 DIAGNOSIS — M9904 Segmental and somatic dysfunction of sacral region: Secondary | ICD-10-CM

## 2015-09-03 DIAGNOSIS — M9903 Segmental and somatic dysfunction of lumbar region: Secondary | ICD-10-CM

## 2015-09-03 DIAGNOSIS — R102 Pelvic and perineal pain: Principal | ICD-10-CM

## 2015-09-03 MED ORDER — MAGIC MOUTHWASH: 5.0000 mL | Freq: Three times a day (TID) | ORAL | Status: DC

## 2015-09-03 MED ORDER — VALACYCLOVIR HCL 1 G PO TABS: 1000.0000 mg | ORAL_TABLET | Freq: Two times a day (BID) | ORAL | Status: DC

## 2015-09-03 NOTE — Patient Instructions (Signed)
Patellofemoral Pain Syndrome  Patellofemoral pain syndrome is a condition that involves a softening or breakdown of the tissue (cartilage) on the underside of your kneecap (patella). This causes pain in the front of the knee. The condition is also called runner's knee or chondromalacia patella. Patellofemoral pain syndrome is most common in young adults who are active in sports.  Your knee is the largest joint in your body. The patella covers the front of your knee and is attached to muscles above and below your knee. The underside of the patella is covered with a smooth type of cartilage (synovium). The smooth surface helps the patella glide easily when you move your knee. Patellofemoral pain syndrome causes swelling in the joint linings and bone surfaces in your knee.   CAUSES   Patellofemoral pain syndrome can be caused by:   Overuse.   Poor alignment of your knee joints.   Weak leg muscles.   A direct blow to your kneecap.  RISK FACTORS  You may be at risk for patellofemoral pain syndrome if you:   Do a lot of activities that can wear down your kneecap. These include:    Running.    Squatting.    Climbing stairs.   Start a new physical activity or exercise program.   Wear shoes that do not fit well.   Do not have good leg strength.   Are overweight.  SIGNS AND SYMPTOMS   Knee pain is the most common symptom of patellofemoral pain syndrome. This may feel like a dull, aching pain underneath your patella, in the front of your knee. There may be a popping or cracking sound when you move your knee. Pain may get worse with:   Exercise.   Climbing stairs.   Running.   Jumping.   Squatting.   Kneeling.   Sitting for a long time.   Moving or pushing on your patella.  DIAGNOSIS   Your health care provider may be able to diagnose patellofemoral pain syndrome from your symptoms and medical history. You may be asked about your recent physical activities and which ones cause knee pain. Your health care  provider may do a physical exam with certain tests to confirm the diagnosis. These may include:   Moving your patella back and forth.   Checking your range of knee motion.   Having you squat or jump to see if you have pain.   Checking the strength of your leg muscles.  An MRI of the knee may also be done.  TREATMENT   Patellofemoral pain syndrome can usually be treated at home with rest, ice, compression, and elevation (RICE). Other treatments may include:   Nonsteroidal anti-inflammatory drugs (NSAIDs).   Physical therapy to stretch and strengthen your leg muscles.   Shoe inserts (orthotics) to take stress off your knee.   A knee brace or knee support.   Surgery to remove damaged cartilage or move the patella to a better position. The need for surgery is rare.  HOME CARE INSTRUCTIONS    Take medicines only as directed by your health care provider.   Rest your knee.   When resting, keep your knee raised above the level of your heart.   Avoid activities that cause knee pain.   Apply ice to the injured area:   Put ice in a plastic bag.   Place a towel between your skin and the bag.   Leave the ice on for 20 minutes, 2-3 times a day.   Use splints,   braces, knee supports, or walking aids as directed by your health care provider.   Perform stretching and strengthening exercises as directed by your health care provider or physical therapist.   Keep all follow-up visits as directed by your health care provider. This is important.  SEEK MEDICAL CARE IF:    Your symptoms get worse.   You are not improving with home care.  MAKE SURE YOU:   Understand these instructions.   Will watch your condition.   Will get help right away if you are not doing well or get worse.     This information is not intended to replace advice given to you by your health care provider. Make sure you discuss any questions you have with your health care provider.     Document Released: 02/15/2009 Document Revised: 03/20/2014  Document Reviewed: 05/19/2013  Elsevier Interactive Patient Education 2016 [employer]

## 2015-09-03 NOTE — Assessment & Plan Note (Signed)
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 Saw podiatry and started on lift. This seemed to help for a couple of weeks, but then stopped helping as much. Also seems to be due to weak core. Discussed need to strengthen this, and she states that she cannot. Doing much better with PT. She does have some somatic dysfunctions that I think are contributing to her symptoms. I think she would benefit from OMT. Patient treated today with fair results. Continue to monitor and recheck in as needed.

## 2015-09-03 NOTE — Progress Notes (Signed)
BP 141/81 mmHg  Pulse 99  Temp(Src) 98.2 F (36.8 C)  Wt 156 lb (70.761 kg)  SpO2 98%   Subjective:    Patient ID: Sharon Rodriguez, female    DOB: Jan 13, 1981, 35 y.o.   MRN: [id number]  HPI: Sharon Rodriguez is a 35 y.o. female  Chief Complaint  Patient presents with  . OMM   KNEE PAIN Duration: couple months Involved knee: right Mechanism of injury: unknown Location:medial Onset: gradual Severity: comes and goes, sometimes severe and sometimes mild  Quality:  Sharp and dull pain Frequency: intermittent Radiation: no Aggravating factors: walking, stairs and bending  Alleviating factors: physical therapy and brace  Status: worse Treatments attempted: wrap, lidocaine cream and physical therapy  Relief with NSAIDs?:  No NSAIDs Taken Weakness with weight bearing or walking: yes Sensation of giving way: yes Locking: yes Popping: yes Bruising: no Swelling: yes Redness: no Paresthesias/decreased sensation: no Fevers: no  SKIN LESION Duration: weeks Location: mouth Painful: yes Itching: no Onset: sudden Context: not changing Associated signs and symptoms: none History of skin cancer: no History of precancerous skin lesions: no Family history of skin cancer: no  She states that her pelvis has been doing better. She feels like PT is really helping and OMT also really helps. Pain is still sharp and in her pubic bone and radiates into her low back and perineum. She notes that it is severe at times, but has been getting a bit better. OMT and PT make it better. Standing and walking for long times makes it worse. She is otherwise doing OK with no other concerns or complaints at this time.    Relevant past medical, surgical, family and social history reviewed and updated as indicated. Interim medical history since our last visit reviewed. Allergies and medications reviewed and updated.  Review of Systems  Constitutional: Negative.   Respiratory: Negative.    Cardiovascular: Negative.   Musculoskeletal: Positive for myalgias, back pain and arthralgias. Negative for joint swelling, gait problem, neck pain and neck stiffness.  Skin: Positive for rash. Negative for color change, pallor and wound.  Psychiatric/Behavioral: Negative.    Per HPI unless specifically indicated above     Objective:    BP 141/81 mmHg  Pulse 99  Temp(Src) 98.2 F (36.8 C)  Wt 156 lb (70.761 kg)  SpO2 98%  Wt Readings from Last 3 Encounters:  09/03/15 156 lb (70.761 kg)  07/22/15 157 lb (71.215 kg)  07/02/15 160 lb (72.576 kg)    Physical Exam  Constitutional: She is oriented to person, place, and time. She appears well-developed and well-nourished. No distress.  HENT:  Head: Normocephalic and atraumatic.  Right Ear: Hearing normal.  Left Ear: Hearing normal.  Nose: Nose normal.  Eyes: Conjunctivae and lids are normal. Right eye exhibits no discharge. Left eye exhibits no discharge. No scleral icterus.  Cardiovascular: Normal rate, regular rhythm, normal heart sounds and intact distal pulses.  Exam reveals no gallop and no friction rub.   No murmur heard. Pulmonary/Chest: Effort normal and breath sounds normal. No respiratory distress. She has no wheezes. She has no rales. She exhibits no tenderness.  Neurological: She is alert and oriented to person, place, and time.  Skin: Skin is warm, dry and intact. No rash noted. She is not diaphoretic. No erythema. No pallor.  Psychiatric: She has a normal mood and affect. Her speech is normal and behavior is normal. Judgment and thought content normal. Cognition and memory are normal.  Nursing note and  vitals reviewed. Musculoskeletal:  Exam found Decreased ROM, Tissue texture changes, Tenderness to palpation and Asymmetry of patient's  thorax, ribs, lumbar, pelvis, sacrum and abdomen Osteopathic Structural Exam:   Thorax: T3-5SLRR,   Ribs: Ribs 5-9 locked up on the L  Lumbar: psoas spasm bilaterally R>L  Pelvis: R  anterior innominate  Sacrum: L on L torsion  Abdomen: diaphragm spasm on the L, pelvic diaphragm spasm on the R  Results for orders placed or performed in visit on 07/22/15  Lyme Ab/Western Blot Reflex  Result Value Ref Range   Lyme IgG/IgM Ab <0.91 0.00 - 0.90 ISR   LYME DISEASE AB, QUANT, IGM <0.80 0.00 - 0.79 index  Babesia microti Antibody Panel  Result Value Ref Range   Babesia microti IgM <1:10 Neg:<1:10   Babesia microti IgG <1:10 Neg:<1:10  Rocky mtn spotted fvr abs pnl(IgG+IgM)  Result Value Ref Range   RMSF IgG Positive (A) Negative   RMSF IgM 0.39 0.00 - 123456 index  Ehrlichia Antibody Panel  Result Value Ref Range   E.Chaffeensis (HME) IgG Negative Neg:<1:64   E. Chaffeensis (HME) IgM Titer Negative Neg:<1:20   HGE IgG Titer Negative Neg:<1:64   HGE IgM Titer Negative Neg:<1:20  RMSF, IgG, IFA  Result Value Ref Range   RMSF, IGG, IFA <1:64 Neg <1:64   XR KNEE LEFT 2 VIEWS, XR KNEE RIGHT 2 VIEWS  Indication: M25.561 Pain in right knee, swelling, giving away, history of JIA, evaluate for damage.   Comparison: 01/16/2008.  Findings: Mild diffuse osteopenia. No fracture is seen. Normal alignment. No joint effusion in either knee.  The joint spaces are well maintained. No erosions. Visualized soft tissues are unremarkable.  Impression:  No erosions or advanced degenerative changes in the bilateral knees.  Electronically Reviewed by: Hardin Negus, MD Electronically Reviewed on: 09/24/2014 10:59 AM  I have reviewed the images and concur with the above findings.  Electronically Signed by: Cathie Beams, MD Electronically Signed on: 09/24/2014 6:18 PM  XR KNEE LEFT 2 VIEWS, XR KNEE RIGHT  2 VIEWS  Indication:  M25.561 Pain in right knee, swelling, giving away, history of JIA, evaluate for damage.       Comparison: 01/16/2008.  Findings: Mild diffuse osteopenia. No fracture is seen. Normal alignment. No joint effusion in either knee.  The joint spaces are well  maintained. No erosions. Visualized soft tissues are unremarkable.       Impression:  No erosions or advanced degenerative changes in the bilateral knees.  Electronically Reviewed by:  Hardin Negus, MD Electronically Reviewed on:  09/24/2014 10:59 AM  I have reviewed the images and concur with the above findings.  Electronically Signed by:  Cathie Beams, MD Electronically Signed on:  09/24/2014 6:18 PM      Assessment & Plan:   Problem List Items Addressed This Visit      Other   Chronic pelvic pain in female - Primary    This may be due to a leg length discrepancy, but seems to be primarily due to myofascial issues and also likely due 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 Saw podiatry and started on lift. This seemed to help for a couple of weeks, but then stopped helping as much. Also seems to be due to weak core. Discussed need to strengthen this, and she states that she cannot. Doing much better with PT. She does have some somatic dysfunctions that I think are contributing to her symptoms. I think she would benefit from OMT. Patient treated today with  fair results. Continue to monitor and recheck in as needed.       Other Visit Diagnoses    Cold sore        Will send through valacyclovir. Call if not getting better or getting worse.     Relevant Medications    nystatin (MYCOSTATIN) 100000 UNIT/ML suspension    magic mouthwash SOLN    valACYclovir (VALTREX) 1000 MG tablet    Thrush        Still acting up. Will get her a refill on her magic mouth wash. Call with any concerns.     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 Will get her into PT- happy with [facility]. Call with any concerns. Hold on brace for now to strengthen muscles.    Relevant Orders    Ambulatory referral to Physical Therapy     Nonallopathic lesion of abdomen        Rib cage region somatic dysfunction        Sacral region somatic dysfunction        Pelvic region somatic dysfunction        Thoracic region somatic dysfunction        Lumbar dysfunction          After verbal consent was obtained, patient was treated today with osteopathic manipulative medicine to the regions of the thorax, ribs, lumbar, pelvis, sacrum and abdomen using the techniques of myofascial release, counterstrain, muscle energy, HVLA and soft tissue. Areas of compensation relating to her primary pain source also treated. Patient tolerated the procedure well with good objective and fair subjective improvement in symptoms. She left the room in good condition. She was advised to stay well hydrated and that she may have some soreness following the procedure. If not improving or worsening, she will call and come in. She will return for reevaluation  in 1-2 months.   Follow up plan: Return 1-2 months .

## 2015-09-10 ENCOUNTER — Ambulatory Visit: Payer: Medicare Other | Admitting: Physical Therapy

## 2015-09-10 DIAGNOSIS — R279 Unspecified lack of coordination: Secondary | ICD-10-CM | POA: Diagnosis not present

## 2015-09-10 DIAGNOSIS — M791 Myalgia, unspecified site: Secondary | ICD-10-CM

## 2015-09-10 DIAGNOSIS — M25561 Pain in right knee: Secondary | ICD-10-CM

## 2015-09-10 DIAGNOSIS — R2689 Other abnormalities of gait and mobility: Secondary | ICD-10-CM | POA: Diagnosis not present

## 2015-09-10 NOTE — Therapy (Signed)
[place] MAIN [facility] SERVICES [address] [place], [place], [address] Phone: [phone]   Fax:  [phone]  Physical Therapy Treatment  Patient Details  Name: Sharon Rodriguez MRN: [id number] Date of Birth: February 15, 1981 Referring Provider: Park Liter, DO   Encounter Date: 09/10/2015      PT End of Session - 09/10/15 1420    Visit Number 6   Number of Visits 12   Date for PT Re-Evaluation 09/20/15   Authorization Type 6 of 10   g code   PT Start Time 1332   PT Stop Time 1430   PT Time Calculation (min) 58 min   Activity Tolerance Patient tolerated treatment well;No increased pain   Behavior During Therapy [facility] for tasks assessed/performed      Past Medical History  Diagnosis Date  . TMJ (dislocation of temporomandibular joint)   . Chronic fatigue   . ADD (attention deficit disorder)   . Sleep apnea   . Chronic bronchitis ([place])   . Chronic pain   . Learning disability   . Anxiety   . Rheumatoid arthritis ([place])   . Osteoarthritis   . Depression   . Fibromyalgia   . Thyroid disease   . Hypothyroidism 01/27/2015    Past Surgical History  Procedure Laterality Date  . Cesarean section      2    There were no vitals filed for this visit.      Subjective Assessment - 09/10/15 1335    Subjective Across the past month, pt has not had any issues. Pelvic and LBP came back at the same intensity across the past 4 days. Pt noticed it came on after she pushed the bee hive structures which was not something she typically performs. Pt also notices when she pushes a box on the floor, she feels her pelvic pain. The pain decreased from 10/10 to 6/10  after walked in a pool and performing her self-correction technique. Today pain 8/10.      Pertinent History Pt recently received a osteopathic Tx from referring provider which relieves her pain now from days to 1 week but pt feels the travel to the clinic is difficult because the driving  causes arm pain.                            Hx of C-section             [facility] PT Assessment - 09/10/15 1342    Functional Tests   Functional tests --  self correction technique (poor carry over)   Strength   Overall Strength Comments hip abd 3/5 B    Palpation   SI assessment  L PSIS more posterior with tenderness with passive hip ext/ flex in sidelying (post-Tx: no pain    Palpation comment slight scar restrictions over abdomen    Special Tests   Sacroiliac Tests  --  Stork Test (hip flexion on L) L PSIS dropped more than RLE                      [facility] Adult PT Treatment/Exercise - 09/10/15 1415    Neuro Re-ed    Neuro Re-ed Details  self -correction technique 10 reps with proper alignment MET R hip flexion, L foot into the table    Exercises   Other Exercises  monster walk, clam shells (sidelying and on forearms with cue for alignment), and pirfirmis stretch (  cross-over)                  PT Education - 09/10/15 1432    Education provided Yes   Education Details HEP   Person(s) Educated Patient   Methods Explanation;Demonstration;Verbal cues;Tactile cues;Handout   Comprehension Returned demonstration;Verbalized understanding             PT Long Term Goals - 08/13/15 1121    PT LONG TERM GOAL #1   Title Pt will decrease her score on PGQ from 65% to < 50% in order to return to ADLs (6/2: 31%)    Time 12   Period Weeks   Status Achieved   PT LONG TERM GOAL #2   Title Pt will report decreased fatigue based on the FSS by 50% (6.7 pt -->3.7pt)   in order to have energy to improve QOL and have the energy to perform household chores     .  (6/2:  FSS 47.6% ( VAFS 5pt)    Time 12   Period Weeks   Status On-going   PT LONG TERM GOAL #3   Title Pt will demo IND with HEP to maintain  decreased mm spasms of upper neck, shoulders, back in order to better manage fibromyalgia a to  to take care of herself and hr family.     Time 12   Period Weeks    Status Achieved   PT LONG TERM GOAL #4   Title Pt will demo more medially aligned coccyx, mobility along L lateral border of sacrum without tenderness to palpation in order to improve back pain    Time 12   Period Weeks   Status Achieved   PT LONG TERM GOAL #5   Title Pt will report decreased use of stool softeners from 2x/week to 0x/ week for 1 week in order to have easier bowel movements naturally and decreased hemorrhoids   Time 12   Period Weeks   Status On-going   Additional Long Term Goals   Additional Long Term Goals Yes   PT LONG TERM GOAL #6   Title Pt will complete an food diary in order to improve stool consistency from Type 1 to 3-4.    Time 12   Period Weeks   Status New               Plan - 09/10/15 1433    Clinical Impression Statement Pt has been able to self manage across 1 month which includes attending a water park with her family. Pt had a relapse of her pelvic and LBP across the past 4 days with pushing her bee hive structure. This activity is not something she usually performs. Also pt reported she has not been doing her HEP as recommended. Pt 's pain decreased following her Tx today . PT did not need to apply manual techniques, instead, pt only required correction of her self-mobilization technique as pt had been performing it incorrectly. Pt also demo'd decreased hip strength and was progressed to hip strengthening exercises with her HEP. Pt will continue to benefit from skilled PT to learn technique for self-management of pain. Educated pt to void "popping" her back.  Plan to assess her knee at next session.     Rehab Potential Good   PT Frequency 2x / week   PT Duration 12 weeks   PT Treatment/Interventions ADLs/Self Care Home Management;Aquatic Therapy;Moist Heat;Electrical Stimulation;Gait training;Traction;Stair training;Functional mobility training;Therapeutic activities;Therapeutic exercise;Neuromuscular re-education;Balance training;Patient/family  education;Manual techniques;Scar mobilization;Dry needling;Taping;Splinting;Energy conservation  Consulted and Agree with Plan of Care Patient      Patient will benefit from skilled therapeutic intervention in order to improve the following deficits and impairments:  Obesity, Postural dysfunction, Improper body mechanics, Pain, Increased fascial restricitons, Hypomobility, Decreased balance, Decreased mobility, Decreased coordination, Decreased scar mobility, Decreased safety awareness, Hypermobility, Difficulty walking, Decreased strength, Decreased range of motion, Decreased endurance, Abnormal gait, Decreased activity tolerance, Increased muscle spasms, Impaired flexibility  Visit Diagnosis: Myalgia  Unspecified lack of coordination  Other abnormalities of gait and mobility  Pain in right knee     Problem List Patient Active Problem List   Diagnosis Date Noted  . Lower limb length difference 02/27/2015  . Hypothyroidism 01/27/2015  . Chronic pelvic pain in female 01/27/2015  . Anxiety   . Rheumatoid arthritis ([place])   . Depression   . Fibromyalgia   . TMJ pain dysfunction syndrome   . Chronic bronchitis ([place])   . Chronic fatigue   . Chronic pain   . ADD (attention deficit disorder)   . Learning disability     Jerl Mina ,PT, DPT, E-RYT  09/10/2015, 2:36 PM  [place] MAIN [facility] SERVICES [address] [place], [place], [address] Phone: [phone]   Fax:  [phone]  Name: Sharon Rodriguez MRN: [id number] Date of Birth: [birth year]

## 2015-09-10 NOTE — Patient Instructions (Signed)
Self-correction technique: Muscle energy technique  (see drawing)      10 reps  ___________   Monster walking  Red band at thigh Foot in to out in semi circles   2 laps in house x 2 x day   ___________   Sharon Rodriguez 45 Degrees   Lying with hips and knees bent 45, one pillow between knees and ankles. Lift knee with exhale. Be sure pelvis does not roll backward. Do not arch back. Do 10 times, each L  leg x 2  times per day. 10 x on R    http://ss.exer.us/75   Copyright  VHI. All rights reserved.   ___   Clam shell propped on forearms   10 x 2 on L  10 x 1 on R

## 2015-09-20 ENCOUNTER — Ambulatory Visit: Payer: Medicare Other | Attending: Family Medicine | Admitting: Physical Therapy

## 2015-09-20 DIAGNOSIS — M791 Myalgia, unspecified site: Secondary | ICD-10-CM

## 2015-09-20 DIAGNOSIS — R279 Unspecified lack of coordination: Secondary | ICD-10-CM | POA: Diagnosis not present

## 2015-09-20 DIAGNOSIS — R2689 Other abnormalities of gait and mobility: Secondary | ICD-10-CM

## 2015-09-20 DIAGNOSIS — M25561 Pain in right knee: Secondary | ICD-10-CM | POA: Diagnosis not present

## 2015-09-20 NOTE — Patient Instructions (Addendum)
Stretches:  New stretches in bold   1.Child pose  2. childs pose side to side  3. Childs pose rocking to table top 10-reps     _______________  Deep core strengthening:  1. One knee out on exhale when belly hugs in. Keep count with hands, 30 reps for both sides, 2 x day      Back/ Buttock/ Leg  Strengthening  1. Bridging series w/ yellow resistive band other side of doorknob:   Position:  Elbows bent, knees hip width apart, heels on chair or stool/  Stabilization points: shoulders, upper arms, back of head pressed into floor. Heel press downward.   Movement: inhale do nothing, exhale pull band by side, lower fists to floor completely while lifting hips.Keep stabilization points engaged when you allow the band to go back to starting position  10 x 2 reps x 2 /day  2: Position:  Elbows straight, arms raised to ceiling at shoulder height, knees apart like a ballerina,heels together on chair or stool   Stabilization points: shoulders, upper arms, back of head pressed into floor. Heel press downward.   Movement: inhale do nothing, exhale pull band by side, lower fists to floor completely while lifting hips. Keep stabilization points engaged when you allow the band to go back to starting position   10 x 2 reps x 2 / day     3. Monster walking    __________________  Discontinue  Frog stretch and prone heel press

## 2015-09-20 NOTE — Therapy (Signed)
[place] MAIN [facility] SERVICES [address] [place], [place], [address] Phone: [phone]   Fax:  [phone]  Physical Therapy Treatment  Patient Details  Name: Sharon Rodriguez MRN: [id number] Date of Birth: 03/13/1981 Referring Provider: Park Liter, DO   Encounter Date: 09/20/2015      PT End of Session - 09/20/15 1438    Visit Number 7   Number of Visits 12   Date for PT Re-Evaluation 09/20/15   Authorization Type 7 of 10   g code   PT Start Time [id number]   PT Stop Time [id number]   PT Time Calculation (min) 55 min   Activity Tolerance Patient tolerated treatment well;No increased pain   Behavior During Therapy [facility] for tasks assessed/performed      Past Medical History  Diagnosis Date  . TMJ (dislocation of temporomandibular joint)   . Chronic fatigue   . ADD (attention deficit disorder)   . Sleep apnea   . Chronic bronchitis ([place])   . Chronic pain   . Learning disability   . Anxiety   . Rheumatoid arthritis ([place])   . Osteoarthritis   . Depression   . Fibromyalgia   . Thyroid disease   . Hypothyroidism 01/27/2015    Past Surgical History  Procedure Laterality Date  . Cesarean section      2    There were no vitals filed for this visit.      Subjective Assessment - 09/20/15 1439    Subjective Pt reported her sacrum  at 10/10 pain for the past three days after helping increasing her activities that included lifting suitcase and walking on uneven grounds when her family went camping.  Pt could not walk the day after the activities and pt rested which helped to decrease the pain. Today, her pain is a 6/10   Pertinent History Pt recently received a osteopathic Tx from referring provider which relieves her pain now from days to 1 week but pt feels the travel to the clinic is difficult because the driving causes arm pain.                            Hx of C-section             [facility] PT Assessment - 09/20/15 1513     Palpation   SI assessment  pre-Tx: extreme tenderness with PA mob along B PSISI and superior sacral glide.  Post-Tx: minimal tenderness.    Palpation comment increased tenderness along L lateral edge of sacrum, decreased post Tx                     [facility] Adult PT Treatment/Exercise - 09/20/15 1514    Therapeutic Activites    Other Therapeutic Activities education about not self-prescribing exercises, encouraged pt to keep count of repetitions      Exercises   Other Exercises  see pt instructions   Manual Therapy   Manual therapy comments long axis distraction along BLE, inferior mob of sacrum 5 bouts 30 sec                      PT Long Term Goals - 09/20/15 1519    PT LONG TERM GOAL #1   Title Pt will decrease her score on PGQ from 65% to < 50% in order to return to ADLs (6/2: 31%)    Time 12   Period  Weeks   Status Achieved   PT LONG TERM GOAL #2   Title Pt will report decreased fatigue based on the FSS by 50% (6.7 pt -->3.7pt)   in order to have energy to improve QOL and have the energy to perform household chores     .  (6/2:  FSS 47.6% ( VAFS 5pt)    Time 12   Period Weeks   Status On-going   PT LONG TERM GOAL #3   Title Pt will demo IND with HEP to maintain  decreased mm spasms of upper neck, shoulders, back in order to better manage fibromyalgia a to  to take care of herself and hr family.     Time 12   Period Weeks   Status Achieved   PT LONG TERM GOAL #4   Title Pt will demo more medially aligned coccyx, mobility along L lateral border of sacrum without tenderness to palpation in order to improve back pain    Time 12   Period Weeks   Status Achieved   PT LONG TERM GOAL #5   Title Pt will report decreased use of stool softeners from 2x/week to 0x/ week for 1 week in order to have easier bowel movements naturally and decreased hemorrhoids   Time 12   Period Weeks   Status Achieved   Additional Long Term Goals   Additional Long Term Goals Yes    PT LONG TERM GOAL #6   Title Pt will complete an food diary in order to improve stool consistency from Type 1 to 3-4.    Time 12   Period Weeks   Status New   PT LONG TERM GOAL #7   Title Pt will report being able to ride on stationaery for 20 min without pain afterwards in order to ride a bike with her dtrs.    Time 12   Period Weeks   Status New   PT LONG TERM GOAL #8   Title Pt will be able to walk 30 min without back pain in order to go on trips with family.    Time 12   Period Weeks   Status New               Plan - 09/20/15 1556    Clinical Impression Statement Pt is progressing well with no pelvic obliquities the past few visits. Pt 's relapse of pain is likely due to increasing activities such as lifting heavy objects and walking on uneven ground. Pt's SIJ pain decreased following Tx today. Pt's HEP was modified with thoracolumbar strengthening and reviewed correct form for deep core level 2 in addition to other stretches.  Pt is progressing well  with signs of returning to more family activities but will continue benefit from skilled PT to increase endurance and strength.    Rehab Potential Good   PT Frequency 2x / week   PT Duration 12 weeks   PT Treatment/Interventions ADLs/Self Care Home Management;Aquatic Therapy;Moist Heat;Electrical Stimulation;Gait training;Traction;Stair training;Functional mobility training;Therapeutic activities;Therapeutic exercise;Neuromuscular re-education;Balance training;Patient/family education;Manual techniques;Scar mobilization;Dry needling;Taping;Splinting;Energy conservation   Consulted and Agree with Plan of Care Patient      Patient will benefit from skilled therapeutic intervention in order to improve the following deficits and impairments:  Obesity, Postural dysfunction, Improper body mechanics, Pain, Increased fascial restricitons, Hypomobility, Decreased balance, Decreased mobility, Decreased coordination, Decreased scar  mobility, Decreased safety awareness, Hypermobility, Difficulty walking, Decreased strength, Decreased range of motion, Decreased endurance, Abnormal gait, Decreased activity tolerance, Increased muscle spasms, Impaired  flexibility  Visit Diagnosis: Myalgia  Unspecified lack of coordination  Other abnormalities of gait and mobility  Pain in right knee     Problem List Patient Active Problem List   Diagnosis Date Noted  . Lower limb length difference 02/27/2015  . Hypothyroidism 01/27/2015  . Chronic pelvic pain in female 01/27/2015  . Anxiety   . Rheumatoid arthritis ([place])   . Depression   . Fibromyalgia   . TMJ pain dysfunction syndrome   . Chronic bronchitis ([place])   . Chronic fatigue   . Chronic pain   . ADD (attention deficit disorder)   . Learning disability     Jerl Mina ,PT, DPT, E-RYT  09/20/2015, 4:00 PM  [place] MAIN [facility] SERVICES [address] [place], [place], [address] Phone: [phone]   Fax:  [phone]  Name: ENEDINA KOVASH MRN: [id number] Date of Birth: 1981/03/03

## 2015-09-21 ENCOUNTER — Ambulatory Visit: Payer: Medicare Other | Admitting: Physical Therapy

## 2015-09-21 DIAGNOSIS — R2689 Other abnormalities of gait and mobility: Secondary | ICD-10-CM | POA: Diagnosis not present

## 2015-09-21 DIAGNOSIS — R279 Unspecified lack of coordination: Secondary | ICD-10-CM | POA: Diagnosis not present

## 2015-09-21 DIAGNOSIS — M791 Myalgia, unspecified site: Secondary | ICD-10-CM

## 2015-09-21 DIAGNOSIS — M25561 Pain in right knee: Secondary | ICD-10-CM

## 2015-09-22 NOTE — Therapy (Signed)
[place] MAIN [facility] SERVICES [address] [place], [place], [address] Phone: [phone]   Fax:  [phone]  Physical Therapy Treatment  Patient Details  Name: Sharon Rodriguez MRN: [id number] Date of Birth: [birth year] Referring Provider: Park Liter, DO   Encounter Date: 09/21/2015      PT End of Session - 09/22/15 0813    Visit Number 8   Number of Visits 12   Date for PT Re-Evaluation 11/05/15   Authorization Type 8 of 10   g code   PT Start Time 1050   PT Stop Time 1130   PT Time Calculation (min) 40 min   Activity Tolerance Patient tolerated treatment well;No increased pain   Behavior During Therapy [facility] for tasks assessed/performed      Past Medical History  Diagnosis Date  . TMJ (dislocation of temporomandibular joint)   . Chronic fatigue   . ADD (attention deficit disorder)   . Sleep apnea   . Chronic bronchitis ([place])   . Chronic pain   . Learning disability   . Anxiety   . Rheumatoid arthritis ([place])   . Osteoarthritis   . Depression   . Fibromyalgia   . Thyroid disease   . Hypothyroidism 01/27/2015    Past Surgical History  Procedure Laterality Date  . Cesarean section      2    There were no vitals filed for this visit.      Subjective Assessment - 09/22/15 0812    Subjective Pt reported she felt better after yesterday's session and no longer felt pain at her back and sacrum   Pertinent History Pt recently received a osteopathic Tx from referring provider which relieves her pain now from days to 1 week but pt feels the travel to the clinic is difficult because the driving causes arm pain.                            Hx of C-section                      Adult Aquatic Therapy - 09/22/15 0812    Aquatic Therapy Subjective   Subjective Pt had no complaints during session       O: Pt entered/exited the pool via steps with single UE support on rail.  50 ft =1 lap  Exercises performed in  3'6" depth   2 laps walking forward / backward with cues for cupped hands for increased resistance  2 laps sidestepping with shoulder abd/add L /R   Water bike cycling 2 min, 2 min rest with stretches : hip flexor, quad, piriformis, and reverse hip flexor stretch with foot on step. Bilaterally,  2 sets of cycling Resistance setting: zero Pt mounted and dismounted bike without difficulty. Water shoes were provided for comfort and protection for pedals.    2 laps hopping on noodle with BLE in wide BOS as pt had difficulty maintaining balance with narrow BOS   (above exercises were performed with dtr as in a group setting. Charges not billed)       Individualized exercises with direct one-on-one attention:  2 laps Front lunge stepping while pulling a noodle against PT's resistance to learn CKC weight bearing to pool floor in BLE and alignment 4 laps side stepping with mini squat, cued for knees behind toes, shoulder retraction/ ext in mini squat and when standing.     Cool down: floating  on noodles as well as resting in seated position on bench                PT Long Term Goals - 09/20/15 1519    PT LONG TERM GOAL #1   Title Pt will decrease her score on PGQ from 65% to < 50% in order to return to ADLs (6/2: 31%)    Time 12   Period Weeks   Status Achieved   PT LONG TERM GOAL #2   Title Pt will report decreased fatigue based on the FSS by 50% (6.7 pt -->3.7pt)   in order to have energy to improve QOL and have the energy to perform household chores     .  (6/2:  FSS 47.6% ( VAFS 5pt)    Time 12   Period Weeks   Status On-going   PT LONG TERM GOAL #3   Title Pt will demo IND with HEP to maintain  decreased mm spasms of upper neck, shoulders, back in order to better manage fibromyalgia a to  to take care of herself and hr family.     Time 12   Period Weeks   Status Achieved   PT LONG TERM GOAL #4   Title Pt will demo more medially aligned coccyx, mobility along L  lateral border of sacrum without tenderness to palpation in order to improve back pain    Time 12   Period Weeks   Status Achieved   PT LONG TERM GOAL #5   Title Pt will report decreased use of stool softeners from 2x/week to 0x/ week for 1 week in order to have easier bowel movements naturally and decreased hemorrhoids   Time 12   Period Weeks   Status Achieved   Additional Long Term Goals   Additional Long Term Goals Yes   PT LONG TERM GOAL #6   Title Pt will complete an food diary in order to improve stool consistency from Type 1 to 3-4.    Time 12   Period Weeks   Status New   PT LONG TERM GOAL #7   Title Pt will report being able to ride on stationaery for 20 min without pain afterwards in order to ride a bike with her dtrs.    Time 12   Period Weeks   Status New   PT LONG TERM GOAL #8   Title Pt will be able to walk 30 min without back pain in order to go on trips with family.    Time 12   Period Weeks   Status New               Plan - 09/22/15 [id number]    Clinical Impression Statement Pt reported no pain following last session. Today, pt tolerated water Tx without complaints and reported feeling good after the session. Initiated cycling in water bike to address her goal to ride a bike on land with her kids. Pt was educated on pelvic girdle stability with mini squat exercise following activities that require pelvic movements such as cycling or walking on uneven ground. Pt voiced understanding. Pt and dtr arrived late to appt and 25 min of session were conducted in a group setting, thus this time was not billed. Pt continues to benefit from skilled PT to progress towards her goals.    Rehab Potential Good   PT Frequency 2x / week   PT Duration 12 weeks   PT Treatment/Interventions ADLs/Self Care Home Management;Aquatic Therapy;Moist Heat;Electrical Stimulation;Gait training;Traction;Stair  training;Functional mobility training;Therapeutic activities;Therapeutic  exercise;Neuromuscular re-education;Balance training;Patient/family education;Manual techniques;Scar mobilization;Dry needling;Taping;Splinting;Energy conservation   Consulted and Agree with Plan of Care Patient      Patient will benefit from skilled therapeutic intervention in order to improve the following deficits and impairments:  Obesity, Postural dysfunction, Improper body mechanics, Pain, Increased fascial restricitons, Hypomobility, Decreased balance, Decreased mobility, Decreased coordination, Decreased scar mobility, Decreased safety awareness, Hypermobility, Difficulty walking, Decreased strength, Decreased range of motion, Decreased endurance, Abnormal gait, Decreased activity tolerance, Increased muscle spasms, Impaired flexibility  Visit Diagnosis: Myalgia  Unspecified lack of coordination  Other abnormalities of gait and mobility  Pain in right knee     Problem List Patient Active Problem List   Diagnosis Date Noted  . Lower limb length difference 02/27/2015  . Hypothyroidism 01/27/2015  . Chronic pelvic pain in female 01/27/2015  . Anxiety   . Rheumatoid arthritis ([place])   . Depression   . Fibromyalgia   . TMJ pain dysfunction syndrome   . Chronic bronchitis ([place])   . Chronic fatigue   . Chronic pain   . ADD (attention deficit disorder)   . Learning disability     Jerl Mina ,PT, DPT, E-RYT  09/22/2015, 8:15 AM  [place] MAIN [facility] SERVICES [address] [place], [place], [address] Phone: [phone]   Fax:  [phone]  Name: TRISIA BOWELL MRN: [id number] Date of Birth: [birth year]

## 2015-09-28 ENCOUNTER — Ambulatory Visit: Payer: Medicare Other | Admitting: Physical Therapy

## 2015-09-28 DIAGNOSIS — R279 Unspecified lack of coordination: Secondary | ICD-10-CM

## 2015-09-28 DIAGNOSIS — R2689 Other abnormalities of gait and mobility: Secondary | ICD-10-CM

## 2015-09-28 DIAGNOSIS — M791 Myalgia, unspecified site: Secondary | ICD-10-CM

## 2015-09-28 DIAGNOSIS — M25561 Pain in right knee: Secondary | ICD-10-CM

## 2015-09-29 DIAGNOSIS — G894 Chronic pain syndrome: Secondary | ICD-10-CM | POA: Diagnosis not present

## 2015-09-29 DIAGNOSIS — Z8739 Personal history of other diseases of the musculoskeletal system and connective tissue: Secondary | ICD-10-CM | POA: Diagnosis not present

## 2015-09-29 DIAGNOSIS — Z79891 Long term (current) use of opiate analgesic: Secondary | ICD-10-CM | POA: Diagnosis not present

## 2015-09-29 NOTE — Therapy (Addendum)
[place] MAIN [facility] SERVICES [address] [place], [place], [address] Phone: [phone]   Fax:  [phone]  Physical Therapy Treatment  Patient Details  Name: Sharon Rodriguez MRN: [id number] Date of Birth: November 05, 1980 Referring Provider: Park Liter, DO   Encounter Date: 09/28/2015      PT End of Session - 09/29/15 1913    Visit Number 9   Number of Visits 12   Date for PT Re-Evaluation 11/05/15   Authorization Type 9/10   PT Start Time 1045   PT Stop Time 1130   PT Time Calculation (min) 45 min   Activity Tolerance Patient tolerated treatment well;No increased pain   Behavior During Therapy [facility] for tasks assessed/performed      Past Medical History  Diagnosis Date  . TMJ (dislocation of temporomandibular joint)   . Chronic fatigue   . ADD (attention deficit disorder)   . Sleep apnea   . Chronic bronchitis ([place])   . Chronic pain   . Learning disability   . Anxiety   . Rheumatoid arthritis ([place])   . Osteoarthritis   . Depression   . Fibromyalgia   . Thyroid disease   . Hypothyroidism 01/27/2015    Past Surgical History  Procedure Laterality Date  . Cesarean section      2    There were no vitals filed for this visit.      Subjective Assessment - 09/29/15 1912    Subjective Pt reported she did not feel pain while in the water at last session but pain came    Pertinent History Pt recently received a osteopathic Tx from referring provider which relieves her pain now from days to 1 week but pt feels the travel to the clinic is difficult because the driving causes arm pain.                            Hx of C-section                       Adult Aquatic Therapy - 09/29/15 1912    Aquatic Therapy Subjective   Subjective During walking front lunges, pt reported R knee pain, after cuing for proper alignment, pt had no knee pain. Pt reported back pain once she was on land. After stretches and cuing for  posterior tilt of pelvis with mini squats on land, pt reported no pain        O: Pt entered/exited the pool via steps with single UE support on rail.  50 ft =1 lap  Exercises performed in 3'6" depth  4 laps walking  2 laps sidestepping + mini squat 4 laps [place] L/R  30 sec biking x 3 sets Stretches  1 lap Forward lunging BLE with tug a war to facilitate propioception of pelvis and LE,  and weight bearing in BLE, pelvic force closure  (cued for proper knee alignment to decrease pain)  Educated about use of semi tandem stance with cleaning counters instead of forward leaning   Stretches on land traction over rail, pirformis stretch, mini squat with cue for posterior tilt in standing which relieved back pain.   relaxation on noodles                  PT Long Term Goals - 09/20/15 1519    PT LONG TERM GOAL #1   Title Pt will decrease her score on PGQ from  65% to < 50% in order to return to ADLs (6/2: 31%)    Time 12   Period Weeks   Status Achieved   PT LONG TERM GOAL #2   Title Pt will report decreased fatigue based on the FSS by 50% (6.7 pt -->3.7pt)   in order to have energy to improve QOL and have the energy to perform household chores     .  (6/2:  FSS 47.6% ( VAFS 5pt)    Time 12   Period Weeks   Status On-going   PT LONG TERM GOAL #3   Title Pt will demo IND with HEP to maintain  decreased mm spasms of upper neck, shoulders, back in order to better manage fibromyalgia a to  to take care of herself and hr family.     Time 12   Period Weeks   Status Achieved   PT LONG TERM GOAL #4   Title Pt will demo more medially aligned coccyx, mobility along L lateral border of sacrum without tenderness to palpation in order to improve back pain    Time 12   Period Weeks   Status Achieved   PT LONG TERM GOAL #5   Title Pt will report decreased use of stool softeners from 2x/week to 0x/ week for 1 week in order to have easier bowel movements naturally and decreased  hemorrhoids   Time 12   Period Weeks   Status Achieved   Additional Long Term Goals   Additional Long Term Goals Yes   PT LONG TERM GOAL #6   Title Pt will complete an food diary in order to improve stool consistency from Type 1 to 3-4.    Time 12   Period Weeks   Status New   PT LONG TERM GOAL #7   Title Pt will report being able to ride on stationaery for 20 min without pain afterwards in order to ride a bike with her dtrs.    Time 12   Period Weeks   Status New   PT LONG TERM GOAL #8   Title Pt will be able to walk 30 min without back pain in order to go on trips with family.    Time 12   Period Weeks   Status New               Plan - 09/29/15 1914    Clinical Impression Statement Pt showed decreased pain in her back following aquatic therapy with education on stretches and proper alignment technique on mini squats. Pt also required alignment cues to minimize R knee pain.  Pt continues to benefit from skilled PT.    Rehab Potential Good   PT Frequency 2x / week   PT Duration 12 weeks   PT Treatment/Interventions ADLs/Self Care Home Management;Aquatic Therapy;Moist Heat;Electrical Stimulation;Gait training;Traction;Stair training;Functional mobility training;Therapeutic activities;Therapeutic exercise;Neuromuscular re-education;Balance training;Patient/family education;Manual techniques;Scar mobilization;Dry needling;Taping;Splinting;Energy conservation   Consulted and Agree with Plan of Care Patient      Patient will benefit from skilled therapeutic intervention in order to improve the following deficits and impairments:  Obesity, Postural dysfunction, Improper body mechanics, Pain, Increased fascial restricitons, Hypomobility, Decreased balance, Decreased mobility, Decreased coordination, Decreased scar mobility, Decreased safety awareness, Hypermobility, Difficulty walking, Decreased strength, Decreased range of motion, Decreased endurance, Abnormal gait, Decreased  activity tolerance, Increased muscle spasms, Impaired flexibility  Visit Diagnosis: Myalgia  Unspecified lack of coordination  Other abnormalities of gait and mobility  Pain in right knee     Problem List  Patient Active Problem List   Diagnosis Date Noted  . Lower limb length difference 02/27/2015  . Hypothyroidism 01/27/2015  . Chronic pelvic pain in female 01/27/2015  . Anxiety   . Rheumatoid arthritis ([place])   . Depression   . Fibromyalgia   . TMJ pain dysfunction syndrome   . Chronic bronchitis ([place])   . Chronic fatigue   . Chronic pain   . ADD (attention deficit disorder)   . Learning disability     Jerl Mina ,PT, DPT, E-RYT  09/29/2015, 7:15 PM  [place] MAIN [facility] SERVICES [address] [place], [place], [address] Phone: [phone]   Fax:  [phone]  Name: Sharon Rodriguez MRN: [id number] Date of Birth: 07/06/1980

## 2015-10-05 ENCOUNTER — Ambulatory Visit: Payer: Medicare Other | Admitting: Physical Therapy

## 2015-10-05 DIAGNOSIS — M25561 Pain in right knee: Secondary | ICD-10-CM | POA: Diagnosis not present

## 2015-10-05 DIAGNOSIS — R2689 Other abnormalities of gait and mobility: Secondary | ICD-10-CM

## 2015-10-05 DIAGNOSIS — R279 Unspecified lack of coordination: Secondary | ICD-10-CM

## 2015-10-05 DIAGNOSIS — M791 Myalgia, unspecified site: Secondary | ICD-10-CM

## 2015-10-05 NOTE — Patient Instructions (Addendum)
Deep core strengthening:  1. One knee out on exhale when belly sinks in. Keep count with hands, 30 reps for both sides, 2 x day      Back/ Buttock/ Leg  Strengthening  1. Bridging series w/ yellow resistive band other side of doorknob:     Position:  Elbows bent, knees hip width apart, heels on chair or stool/  Stabilization points: shoulders, upper arms, back of head pressed into floor. Heel press downward.   Movement: inhale do nothing, exhale pull band by side, lower fists to floor completely while lifting hips.Keep stabilization points engaged when you allow the band to go back to starting position  10 x 2 reps x 2 /day   3. Monster walking   4. Maintain stretches   __________________    During the day:  Standing to cook:   Standing with one foot forward and one foot back (hip width apart)  We will work on lifting at next session   Take a break from sitting every hours.   Toileting: breathing and not pushing with bowel movement  _________________ morning or night: Body scan mindfulness technique

## 2015-10-06 ENCOUNTER — Encounter: Payer: Medicare Other | Admitting: Physical Therapy

## 2015-10-12 ENCOUNTER — Encounter: Payer: Self-pay | Admitting: Physical Therapy

## 2015-10-12 NOTE — Therapy (Signed)
[place] MAIN [facility] SERVICES [address] [place], [place], [address] Phone: [phone]   Fax:  [phone]  Physical Therapy Treatment  Patient Details  Name: Sharon Rodriguez MRN: [id number] Date of Birth: [birth year] Referring Provider: Park Liter, DO   Encounter Date: 10/05/2015    Past Medical History:  Diagnosis Date  . ADD (attention deficit disorder)   . Anxiety   . Chronic bronchitis ([place])   . Chronic fatigue   . Chronic pain   . Depression   . Fibromyalgia   . Hypothyroidism 01/27/2015  . Learning disability   . Osteoarthritis   . Rheumatoid arthritis ([place])   . Sleep apnea   . Thyroid disease   . TMJ (dislocation of temporomandibular joint)     Past Surgical History:  Procedure Laterality Date  . CESAREAN SECTION     2    There were no vitals filed for this visit.          [facility] PT Assessment - 10/12/15 1642      Observation/Other Assessments   Observations poor form with previous HEP (lumbar lordosis)      Coordination   Gross Motor Movements are Fluid and Coordinated --  paradoxical breathing in upright position   Fine Motor Movements are Fluid and Coordinated --  abdominal straining with bowel movement cue                     [facility] Adult PT Treatment/Exercise - 10/12/15 1643      Therapeutic Activites    Other Therapeutic Activities bowel movement toileting technique, HEP review, education on following PT prescribed exercise and not youtube or  exercises found on the internet       Neuro Re-ed    Neuro Re-ed Details  breathing coordination for deep core and for bowel movements, alignment and technique for HEP                     PT Long Term Goals - 10/12/15 1644      PT LONG TERM GOAL #1   Title Pt will decrease her score on PGQ from 65% to < 50% in order to return to ADLs (6/2: 31%)    Time 12   Period Weeks   Status Achieved     PT LONG TERM GOAL #2   Title Pt will report decreased fatigue based on the FSS by 50% (6.7 pt -->3.7pt)   in order to have energy to improve QOL and have the energy to perform household chores     .  (6/2:  FSS 47.6% ( VAFS 5pt)    Time 12   Period Weeks   Status On-going     PT LONG TERM GOAL #3   Title Pt will demo IND with HEP to maintain  decreased mm spasms of upper neck, shoulders, back in order to better manage fibromyalgia a to  to take care of herself and hr family.     Time 12   Period Weeks   Status Achieved     PT LONG TERM GOAL #4   Title Pt will demo more medially aligned coccyx, mobility along L lateral border of sacrum without tenderness to palpation in order to improve back pain    Time 12   Period Weeks   Status Achieved     PT LONG TERM GOAL #5   Title Pt will report decreased use of stool softeners from 2x/week to 0x/  week for 1 week in order to have easier bowel movements naturally and decreased hemorrhoids   Time 12   Period Weeks   Status Achieved     PT LONG TERM GOAL #6   Title Pt will complete an food diary in order to improve stool consistency from Type 1 to 3-4.    Time 12   Period Weeks   Status New     PT LONG TERM GOAL #7   Title Pt will report being able to ride on stationaery for 20 min without pain afterwards in order to ride a bike with her dtrs.    Time 12   Period Weeks   Status New     PT LONG TERM GOAL #8   Title Pt will be able to walk 30 min without back pain in order to go on trips with family.    Time 12   Period Weeks   Status New               Plan - 10/12/15 1644    Clinical Impression Statement Pt required cues for previous HEP as she had showed poor form and technique. Pt was educated on staying compliant with prescribed exercises and to withhold from following exercises she finds on [employer] because she showed poor form and technique with the exercises when assessed today. Pt's HEP was consolidated and she was also educated on  toileting mechanics to promote bowel movements. Pt demo'd exercises correctly and voiced understanding at the end of the session. Pt will continue to benefit from skilled PT to advance her strengthening phase of her POC in order to help her return to increased activities and ADLs. .     Rehab Potential Good   PT Frequency 2x / week   PT Duration 12 weeks   PT Treatment/Interventions ADLs/Self Care Home Management;Aquatic Therapy;Moist Heat;Electrical Stimulation;Gait training;Traction;Stair training;Functional mobility training;Therapeutic activities;Therapeutic exercise;Neuromuscular re-education;Balance training;Patient/family education;Manual techniques;Scar mobilization;Dry needling;Taping;Splinting;Energy conservation   Consulted and Agree with Plan of Care Patient      Patient will benefit from skilled therapeutic intervention in order to improve the following deficits and impairments:  Obesity, Postural dysfunction, Improper body mechanics, Pain, Increased fascial restricitons, Hypomobility, Decreased balance, Decreased mobility, Decreased coordination, Decreased scar mobility, Decreased safety awareness, Hypermobility, Difficulty walking, Decreased strength, Decreased range of motion, Decreased endurance, Abnormal gait, Decreased activity tolerance, Increased muscle spasms, Impaired flexibility  Visit Diagnosis: Myalgia  Unspecified lack of coordination  Other abnormalities of gait and mobility  Pain in right knee       G-Codes - 2015/10/24 1645    Functional Assessment Tool Used clinical judgement and pt report    Functional Limitation Mobility: Walking and moving around   Mobility: Walking and Moving Around Current Status [phone]) At least 40 percent but less than 60 percent impaired, limited or restricted   Mobility: Walking and Moving Around Goal Status [phone]) At least 20 percent but less than 40 percent impaired, limited or restricted      Problem List Patient Active  Problem List   Diagnosis Date Noted  . Lower limb length difference 02/27/2015  . Hypothyroidism 01/27/2015  . Chronic pelvic pain in female 01/27/2015  . Anxiety   . Rheumatoid arthritis ([place])   . Depression   . Fibromyalgia   . TMJ pain dysfunction syndrome   . Chronic bronchitis ([place])   . Chronic fatigue   . Chronic pain   . ADD (attention deficit disorder)   . Learning disability  Jerl Mina ,PT, DPT, E-RYT   10/12/2015, 4:46 PM  [place] MAIN [facility] SERVICES [address] [place], [place], [address] Phone: [phone]   Fax:  [phone]  Name: Sharon Rodriguez MRN: [id number] Date of Birth: Aug 07, 1980

## 2015-10-20 ENCOUNTER — Ambulatory Visit: Payer: Medicare Other | Admitting: Physical Therapy

## 2015-10-21 ENCOUNTER — Ambulatory Visit: Payer: Medicare Other | Attending: Family Medicine | Admitting: Physical Therapy

## 2015-10-21 DIAGNOSIS — R279 Unspecified lack of coordination: Secondary | ICD-10-CM | POA: Insufficient documentation

## 2015-10-21 DIAGNOSIS — M791 Myalgia, unspecified site: Secondary | ICD-10-CM

## 2015-10-21 DIAGNOSIS — M25561 Pain in right knee: Secondary | ICD-10-CM | POA: Diagnosis not present

## 2015-10-21 DIAGNOSIS — R2689 Other abnormalities of gait and mobility: Secondary | ICD-10-CM | POA: Insufficient documentation

## 2015-10-22 NOTE — Therapy (Signed)
[place] MAIN [facility] SERVICES [address] [place], [place], [address] Phone: [phone]   Fax:  [phone]  Physical Therapy Treatment  Patient Details  Name: Sharon Rodriguez MRN: [id number] Date of Birth: 1980/05/13 Referring Provider: Leafy Ro MD  Encounter Date: 10/21/2015      PT End of Session - 10/21/15 1632    Visit Number 11   Date for PT Re-Evaluation 11/05/15   Authorization Type 1/10   PT Start Time 1632      Past Medical History:  Diagnosis Date  . ADD (attention deficit disorder)   . Anxiety   . Chronic bronchitis ([place])   . Chronic fatigue   . Chronic pain   . Depression   . Fibromyalgia   . Hypothyroidism 01/27/2015  . Learning disability   . Osteoarthritis   . Rheumatoid arthritis ([place])   . Sleep apnea   . Thyroid disease   . TMJ (dislocation of temporomandibular joint)     Past Surgical History:  Procedure Laterality Date  . CESAREAN SECTION     2    There were no vitals filed for this visit.      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                            Hx of C-section             [facility] PT Assessment - 10/22/15 2043      Assessment   Medical Diagnosis urge incontinence   Referring Provider Leafy Ro MD     Precautions   Precautions None     Restrictions   Weight Bearing Restrictions No     Balance Screen   Has the patient fallen in the past 6 months No     Observation/Other Assessments   Other Surveys  --     Coordination   Fine Motor Movements are Fluid and Coordinated --  unable to demo proper pelvic floor coordination     Palpation   Spinal mobility decreased  midback mm tensions   SI assessment  SIJ symmetrical                     [facility] Adult PT Treatment/Exercise - 10/22/15 2043      Therapeutic Activites    Other Therapeutic Activities deep core level 2, (20 reps but pt reported feeling dizzy. Provided cues and propped trunk/head with pillows after which pt reported less dizziness). Pt unable to demo pelvic floor coordination.      Manual Therapy   Manual therapy comments fascial releases over LQ abdomen                PT Education - 10/22/15 2045    Education provided Yes   Education Details HEP   Person(s) Educated Patient   Methods Explanation;Demonstration;Tactile cues;Verbal cues;Handout   Comprehension Returned demonstration;Verbalized understanding             PT Long Term Goals - 10/21/15 1633      PT LONG TERM GOAL #1   Title Pt will decrease her score  on PGQ from 65% to < 50% in order to return to ADLs (6/2: 31%)    Time 12   Period Weeks   Status Achieved     PT LONG TERM GOAL #2   Title Pt will report decreased fatigue based on the FSS by 50% (6.7 pt -->3.7pt)   in order to have energy to improve QOL and have the energy to perform household chores     .  (6/2:  FSS 47.6% ( VAFS 5pt)    Time 12   Period Weeks   Status On-going     PT LONG TERM GOAL #3   Title Pt will demo IND with HEP to maintain  decreased mm spasms of upper neck, shoulders, back in order to better manage fibromyalgia a to  to take care of herself and hr family.     Time 12   Period Weeks   Status Achieved     PT LONG TERM GOAL #4   Title Pt will demo more medially aligned coccyx, mobility along L lateral border of sacrum without tenderness to palpation in order to improve back pain    Time 12   Period Weeks   Status Achieved     PT LONG TERM GOAL #5   Title Pt will report decreased use of stool softeners from 2x/week to 0x/ week for 1 week in order to have easier bowel movements naturally and decreased hemorrhoids    Time 12   Period Weeks   Status Achieved     PT LONG TERM GOAL #6   Title Pt will complete an food diary in order to improve stool consistency from Type 1 to 3-4.    Time 12   Period Weeks   Status Deferred     PT LONG TERM GOAL #7   Title Pt will report being able to ride on stationery for 10 min without pain afterwards in order to ride a bike with her dtrs.    Time 12   Period Weeks   Status Revised     PT LONG TERM GOAL #8   Title Pt will be perform lifting with proper body mechanics lifting 15-20 pounds  in order to minimize relapse of pain .    Time 12   Period Weeks   Status New               Plan - 10/22/15 2053    Clinical Impression Statement Pt has achieved 4/7 goals and continues to require skilled PT to advance towards her remaining goals.  Pt has shown more symmetrically aligned sacroiliac joint, resolved DRA, decreased mm tensions at the midback and pelvis and abdominal scar adhesions,  improved posture. Pt is working on increasing deep core mm strength and learning to coordinate pelvic floor activation in order to achieve reamining goals.    Rehab Potential Good   PT Frequency 2x / week   PT Duration 12 weeks   PT Treatment/Interventions ADLs/Self Care Home Management;Aquatic Therapy;Moist Heat;Electrical Stimulation;Gait training;Traction;Stair training;Functional mobility training;Therapeutic activities;Therapeutic exercise;Neuromuscular re-education;Balance training;Patient/family education;Manual techniques;Scar mobilization;Dry needling;Taping;Splinting;Energy conservation   Consulted and Agree with Plan of Care Patient      Patient will benefit from skilled therapeutic intervention in order to improve the following deficits and impairments:  Obesity, Postural dysfunction, Improper body mechanics, Pain, Increased fascial restricitons, Hypomobility, Decreased balance, Decreased mobility, Decreased coordination, Decreased scar mobility, Decreased safety  awareness, Hypermobility, Difficulty walking, Decreased strength, Decreased range of motion, Decreased endurance, Abnormal gait, Decreased activity tolerance,  Increased muscle spasms, Impaired flexibility  Visit Diagnosis: Myalgia - Plan: PT plan of care cert/re-cert  Unspecified lack of coordination - Plan: PT plan of care cert/re-cert  Other abnormalities of gait and mobility - Plan: PT plan of care cert/re-cert  Pain in right knee - Plan: PT plan of care cert/re-cert     Problem List Patient Active Problem List   Diagnosis Date Noted  . Lower limb length difference 02/27/2015  . Hypothyroidism 01/27/2015  . Chronic pelvic pain in female 01/27/2015  . Anxiety   . Rheumatoid arthritis ([place])   . Depression   . Fibromyalgia   . TMJ pain dysfunction syndrome   . Chronic bronchitis ([place])   . Chronic fatigue   . Chronic pain   . ADD (attention deficit disorder)   . Learning disability     Jerl Mina ,PT, DPT, E-RYT  10/22/2015, 9:00 PM  [place] MAIN [facility] SERVICES [address] [place], [place], [address] Phone: [phone]   Fax:  [phone]  Name: Sharon Rodriguez MRN: [id number] Date of Birth: 01/17/1981

## 2015-10-25 ENCOUNTER — Ambulatory Visit: Payer: Medicare Other | Admitting: Physical Therapy

## 2015-10-25 DIAGNOSIS — M791 Myalgia, unspecified site: Secondary | ICD-10-CM

## 2015-10-25 DIAGNOSIS — M25561 Pain in right knee: Secondary | ICD-10-CM | POA: Diagnosis not present

## 2015-10-25 DIAGNOSIS — R279 Unspecified lack of coordination: Secondary | ICD-10-CM

## 2015-10-25 DIAGNOSIS — R2689 Other abnormalities of gait and mobility: Secondary | ICD-10-CM | POA: Diagnosis not present

## 2015-10-25 NOTE — Patient Instructions (Signed)
Deep core level 2 one knee out at a time    Hip flexor stretch with foot on chair    Extended side angle    (recorded cuing and alignment on pt's cell p hone)

## 2015-10-26 ENCOUNTER — Ambulatory Visit: Payer: Medicare Other | Admitting: Physical Therapy

## 2015-10-26 NOTE — Therapy (Signed)
[place] MAIN [facility] SERVICES [address] [place], [place], [address] Phone: [phone]   Fax:  [phone]  Physical Therapy Treatment  Patient Details  Name: Sharon Rodriguez MRN: [id number] Date of Birth: 03/19/1980 Referring Provider: Leafy Ro MD  Encounter Date: 10/25/2015      PT End of Session - 10/26/15 2124    Visit Number 12   Date for PT Re-Evaluation 11/05/15   Authorization Type 2/10   PT Start Time [id number]   PT Stop Time 1720   PT Time Calculation (min) 76 min   Activity Tolerance Patient tolerated treatment well;No increased pain   Behavior During Therapy WFL for tasks assessed/performed      Past Medical History:  Diagnosis Date  . ADD (attention deficit disorder)   . Anxiety   . Chronic bronchitis ([place])   . Chronic fatigue   . Chronic pain   . Depression   . Fibromyalgia   . Hypothyroidism 01/27/2015  . Learning disability   . Osteoarthritis   . Rheumatoid arthritis ([place])   . Sleep apnea   . Thyroid disease   . TMJ (dislocation of temporomandibular joint)     Past Surgical History:  Procedure Laterality Date  . CESAREAN SECTION     2    There were no vitals filed for this visit.      Subjective Assessment - 10/25/15 1653    Subjective Pt reported she has been doing the deep core level 2 exercise and has not had any dizziness. Pt today feels soreness in her back SIJ area and pubic area.    Pertinent History Pt recently received a osteopathic Tx from referring provider which relieves her pain now from days to 1 week but pt feels the travel to the clinic is difficult because the driving causes arm pain.                            Hx of C-section             [facility] PT Assessment - 10/26/15 2124      Coordination   Fine Motor Movements are Fluid and Coordinated --  proper diaphragmatic breathing, pelvic floor movement limite     Palpation   Palpation comment L PSIS more inferior with SLS with L hip  flexion (post Tx, more symmetrical )   abdominal scar decreased restrictions                  Pelvic Floor Special Questions - 10/26/15 2123    Pelvic Floor Internal Exam pt consented verbally without contraindication   Exam Type Vaginal   Palpation increased mm tensions along bulbospongiousus and obt int B, coccygeus. delayed lengthening.             [place] Adult PT Treatment/Exercise - 10/26/15 2124      Therapeutic Activites    Other Therapeutic Activities see pt instructions      Manual Therapy   Manual therapy comments intravaginal mm releases bilaterally, external releases at coccygeus                 PT Education - 10/25/15 1711    Education provided Yes   Education Details HEP   Person(s) Educated Patient   Methods Explanation;Demonstration;Tactile cues;Verbal cues;Handout   Comprehension Returned demonstration;Verbalized understanding             PT Long Term Goals - 10/21/15 1633  PT LONG TERM GOAL #1   Title Pt will decrease her score on PGQ from 65% to < 50% in order to return to ADLs (6/2: 31%)    Time 12   Period Weeks   Status Achieved     PT LONG TERM GOAL #2   Title Pt will report decreased fatigue based on the FSS by 50% (6.7 pt -->3.7pt)   in order to have energy to improve QOL and have the energy to perform household chores     .  (6/2:  FSS 47.6% ( VAFS 5pt)    Time 12   Period Weeks   Status On-going     PT LONG TERM GOAL #3   Title Pt will demo IND with HEP to maintain  decreased mm spasms of upper neck, shoulders, back in order to better manage fibromyalgia a to  to take care of herself and hr family.     Time 12   Period Weeks   Status Achieved     PT LONG TERM GOAL #4   Title Pt will demo more medially aligned coccyx, mobility along L lateral border of sacrum without tenderness to palpation in order to improve back pain    Time 12   Period Weeks   Status Achieved     PT LONG TERM GOAL #5   Title Pt will  report decreased use of stool softeners from 2x/week to 0x/ week for 1 week in order to have easier bowel movements naturally and decreased hemorrhoids   Time 12   Period Weeks   Status Achieved     PT LONG TERM GOAL #6   Title Pt will complete an food diary in order to improve stool consistency from Type 1 to 3-4.    Time 12   Period Weeks   Status Deferred     PT LONG TERM GOAL #7   Title Pt will report being able to ride on stationery for 10 min without pain afterwards in order to ride a bike with her dtrs.    Time 12   Period Weeks   Status Revised     PT LONG TERM GOAL #8   Title Pt will be perform lifting with proper body mechanics lifting 15-20 pounds  in order to minimize relapse of pain .    Time 12   Period Weeks   Status New               Plan - 10/26/15 2125    Clinical Impression Statement Pt reported she no longer felt dizzy w/ breathing during  the deep core level 2 exercise. However,  she required corrections to her technique to minimize pubic symphysis pain ( she performed B hip ER/abd instead of single knee out).  Pt showed increased pelvic floor ROM but has difficulty sensing lengthened state of her overactive pelvic floor mm. Pt was able to decreased her pinpointed point above R SIJ area with prescribed stretches and the pain she reported at the beginning of the session all resolved at the end of the session. Pt was educated to replace her self-selected stretch with HEP provided her her today. Pt voiced understanding. Pt will be worked into [employer] schedule when there is a cancellation within the next two weeks due to unavailable appts.  Pt progressing well towards her goals and entering her strengthening phase appropriately.      Rehab Potential Good   PT Frequency 2x / week   PT Duration 12 weeks  PT Treatment/Interventions ADLs/Self Care Home Management;Aquatic Therapy;Moist Heat;Electrical Stimulation;Gait training;Traction;Stair training;Functional mobility  training;Therapeutic activities;Therapeutic exercise;Neuromuscular re-education;Balance training;Patient/family education;Manual techniques;Scar mobilization;Dry needling;Taping;Splinting;Energy conservation   Consulted and Agree with Plan of Care Patient      Patient will benefit from skilled therapeutic intervention in order to improve the following deficits and impairments:  Obesity, Postural dysfunction, Improper body mechanics, Pain, Increased fascial restricitons, Hypomobility, Decreased balance, Decreased mobility, Decreased coordination, Decreased scar mobility, Decreased safety awareness, Hypermobility, Difficulty walking, Decreased strength, Decreased range of motion, Decreased endurance, Abnormal gait, Decreased activity tolerance, Increased muscle spasms, Impaired flexibility  Visit Diagnosis: Myalgia  Other abnormalities of gait and mobility  Pain in right knee  Unspecified lack of coordination     Problem List Patient Active Problem List   Diagnosis Date Noted  . Lower limb length difference 02/27/2015  . Hypothyroidism 01/27/2015  . Chronic pelvic pain in female 01/27/2015  . Anxiety   . Rheumatoid arthritis ([place])   . Depression   . Fibromyalgia   . TMJ pain dysfunction syndrome   . Chronic bronchitis ([place])   . Chronic fatigue   . Chronic pain   . ADD (attention deficit disorder)   . Learning disability     Jerl Mina ,PT, DPT, E-RYT  10/26/2015, 9:30 PM  [place] MAIN [facility] SERVICES [address] [place], [place], [address] Phone: [phone]   Fax:  [phone]  Name: Sharon Rodriguez MRN: [id number] Date of Birth: 10-10-1980

## 2015-11-18 ENCOUNTER — Ambulatory Visit: Payer: Medicare Other | Attending: Family Medicine | Admitting: Physical Therapy

## 2015-11-18 DIAGNOSIS — M791 Myalgia, unspecified site: Secondary | ICD-10-CM

## 2015-11-18 DIAGNOSIS — R279 Unspecified lack of coordination: Secondary | ICD-10-CM

## 2015-11-18 DIAGNOSIS — R5383 Other fatigue: Secondary | ICD-10-CM | POA: Diagnosis not present

## 2015-11-18 DIAGNOSIS — F418 Other specified anxiety disorders: Secondary | ICD-10-CM | POA: Diagnosis not present

## 2015-11-18 DIAGNOSIS — R2689 Other abnormalities of gait and mobility: Secondary | ICD-10-CM

## 2015-11-18 DIAGNOSIS — Z1389 Encounter for screening for other disorder: Secondary | ICD-10-CM | POA: Diagnosis not present

## 2015-11-18 DIAGNOSIS — M25561 Pain in right knee: Secondary | ICD-10-CM

## 2015-11-18 DIAGNOSIS — R42 Dizziness and giddiness: Secondary | ICD-10-CM | POA: Diagnosis not present

## 2015-11-18 NOTE — Patient Instructions (Signed)
Please do not follow other exercises that you find on youtube while we are working together because it can offset the progress you are making.   For the inner groin issue today:  Extended side angle, keep the hand at your waist instead of raising it up  5 breaths    Wide angle childs pose with pillows under belly and for head  5 breaths   Frog 10 x

## 2015-11-19 NOTE — Therapy (Signed)
[place] MAIN [facility] SERVICES [address] [place], [place], [address] Phone: [phone]   Fax:  [phone]  Physical Therapy Treatment  Patient Details  Name: [name] MRN: [id number] Date of Birth: 07-26-1980 Referring Provider: Leafy Ro MD  Encounter Date: 11/18/2015      PT End of Session - 11/19/15 2207    Visit Number 13   Date for PT Re-Evaluation 02/10/16   Authorization Type 3/10   PT Start Time [id number]   PT Stop Time 1625   PT Time Calculation (min) 55 min   Activity Tolerance Patient tolerated treatment well;No increased pain   Behavior During Therapy WFL for tasks assessed/performed      Past Medical History:  Diagnosis Date  . ADD (attention deficit disorder)   . Anxiety   . Chronic bronchitis ([place])   . Chronic fatigue   . Chronic pain   . Depression   . Fibromyalgia   . Hypothyroidism 01/27/2015  . Learning disability   . Osteoarthritis   . Rheumatoid arthritis ([place])   . Sleep apnea   . Thyroid disease   . TMJ (dislocation of temporomandibular joint)     Past Surgical History:  Procedure Laterality Date  . CESAREAN SECTION     2    There were no vitals filed for this visit.      Subjective Assessment - 11/18/15 1540    Subjective Pt reported she has been performing deep core level 2 but she started added additional exercises she found on youtube and she started to feel a 10/10 R frontal pubic pain that limited walking.  Pt has not been going to the chiropractor but she has an appt this coming Tues.    Pertinent History Pt recently received a osteopathic Tx from referring provider which relieves her pain now from days to 1 week but pt feels the travel to the clinic is difficult because the driving causes arm pain.                            Hx of C-section             [facility] PT Assessment - 11/19/15 2206      Strength   Overall Strength Comments hip 4/5 B      Palpation   SI assessment  SIJ  symmetrical   Palpation comment increased tenderness at R obturator internus/ adductors. (post Tx plus extended side pose.:  5/10 at the same area) ,                      [facility] Adult PT Treatment/Exercise - 11/19/15 2205      Therapeutic Activites    Other Therapeutic Activities see pt instructions for stretches, discussed agreement to withhold trying youtube videos while receiving PT in order to minimize relapse of Sx      Manual Therapy   Manual therapy comments STM, MWM at adductor/ obturator  internus R                 PT Education - 11/19/15 2207    Education provided Yes   Education Details HEP   Person(s) Educated Patient   Methods Explanation;Demonstration;Tactile cues;Verbal cues;Handout   Comprehension Returned demonstration;Verbalized understanding             PT Long Term Goals - 11/18/15 1609      PT LONG TERM GOAL #1   Title  Pt will decrease her score on PGQ from 65% to < 50% in order to return to ADLs (6/2: 31%)    Time 12   Period Weeks   Status Achieved     PT LONG TERM GOAL #2   Title Pt will report decreased fatigue based on the FSS by 50% (6.7 pt -->3.7pt)   in order to have energy to improve QOL and have the energy to perform household chores     .  (6/2:  FSS 47.6% ( VAFS 5pt)    Time 12   Period Weeks   Status On-going     PT LONG TERM GOAL #3   Title Pt will demo IND with HEP to maintain  decreased mm spasms of upper neck, shoulders, back in order to better manage fibromyalgia a to  to take care of herself and hr family.     Time 12   Period Weeks   Status Achieved     PT LONG TERM GOAL #4   Title Pt will demo more medially aligned coccyx, mobility along L lateral border of sacrum without tenderness to palpation in order to improve back pain    Time 12   Period Weeks   Status Achieved     PT LONG TERM GOAL #5   Title Pt will report decreased use of stool softeners from 2x/week to 0x/ week for 1 week in order to have  easier bowel movements naturally and decreased hemorrhoids   Time 12   Period Weeks   Status Achieved     PT LONG TERM GOAL #6   Title Pt will complete an food diary in order to improve stool consistency from Type 1 to 3-4.    Time 12   Period Weeks   Status Deferred     PT LONG TERM GOAL #7   Title Pt will report being able to ride on stationery for 10 min without pain afterwards in order to ride a bike with her dtrs.    Time 12   Period Weeks   Status Revised     PT LONG TERM GOAL #8   Title Pt will be perform lifting with proper body mechanics lifting 15-20 pounds  in order to minimize relapse of pain .    Time 12   Period Weeks   Status New               Plan - 11/19/15 2224    Clinical Impression Statement Pt required reinteration of not following youtube videos while undering PT as pt showed new complaints related to new exercises she learned from youtube.  Pt voiced agreement. Pt's anterior pelvic pain decreased with manual Tx and stretches today.  Pt showed good carry over with increased postural control and no malalignment of sacrum as pt reported compliance with deep core exercise. Pt's previous complaint ar R PSIS has not been an issue today. Pt will continue to benefit from skilled PT to progress towards biking and will be ready for d/c following 2 appointments.      Rehab Potential Good   PT Frequency 2x / week   PT Duration 12 weeks   PT Treatment/Interventions ADLs/Self Care Home Management;Aquatic Therapy;Moist Heat;Electrical Stimulation;Gait training;Traction;Stair training;Functional mobility training;Therapeutic activities;Therapeutic exercise;Neuromuscular re-education;Balance training;Patient/family education;Manual techniques;Scar mobilization;Dry needling;Taping;Splinting;Energy conservation   Consulted and Agree with Plan of Care Patient      Patient will benefit from skilled therapeutic intervention in order to improve the following deficits and  impairments:  Obesity, Postural  dysfunction, Improper body mechanics, Pain, Increased fascial restricitons, Hypomobility, Decreased balance, Decreased mobility, Decreased coordination, Decreased scar mobility, Decreased safety awareness, Hypermobility, Difficulty walking, Decreased strength, Decreased range of motion, Decreased endurance, Abnormal gait, Decreased activity tolerance, Increased muscle spasms, Impaired flexibility  Visit Diagnosis: Myalgia  Other abnormalities of gait and mobility  Pain in right knee  Unspecified lack of coordination     Problem List Patient Active Problem List   Diagnosis Date Noted  . Lower limb length difference 02/27/2015  . Hypothyroidism 01/27/2015  . Chronic pelvic pain in female 01/27/2015  . Anxiety   . Rheumatoid arthritis ([place])   . Depression   . Fibromyalgia   . TMJ pain dysfunction syndrome   . Chronic bronchitis ([place])   . Chronic fatigue   . Chronic pain   . ADD (attention deficit disorder)   . Learning disability     Jerl Mina ,PT, DPT, E-RYT  11/19/2015, 10:28 PM  [place] MAIN [facility] SERVICES [address] [place], [place], [address] Phone: [phone]   Fax:  [phone]  Name: PALLIE BANEZ MRN: [id number] Date of Birth: 09-22-1980

## 2015-11-22 ENCOUNTER — Ambulatory Visit: Payer: Medicare Other | Admitting: Family Medicine

## 2015-11-23 ENCOUNTER — Ambulatory Visit: Payer: Medicare Other | Admitting: Physical Therapy

## 2015-11-25 ENCOUNTER — Ambulatory Visit: Payer: Medicare Other | Admitting: Physical Therapy

## 2015-11-25 DIAGNOSIS — M791 Myalgia, unspecified site: Secondary | ICD-10-CM

## 2015-11-25 DIAGNOSIS — M25561 Pain in right knee: Secondary | ICD-10-CM

## 2015-11-25 DIAGNOSIS — R2689 Other abnormalities of gait and mobility: Secondary | ICD-10-CM

## 2015-11-25 DIAGNOSIS — R279 Unspecified lack of coordination: Secondary | ICD-10-CM | POA: Diagnosis not present

## 2015-11-25 NOTE — Patient Instructions (Signed)
Stretches for the back:  Laying on back, scoot hips to the right, drop knees to the left, neck turns to the R  5 breaths      Single knee to chest  With a towel behind thigh       Double knee to chest with towel behind thigh       Bridges:  Use deep mms to relax buttocks and back  Muscles      Standing marches  opp knee to elbow  10 each leg       Arm strengthening:  Yellow band at doorknob  Multifidis twist  10 each side     Lat pull down   10 each side      Arm stretches: Patting on your back     Eagle pose     Neck movements:  6 directions       Spinal ROM  6 directions

## 2015-11-30 NOTE — Therapy (Signed)
[place] MAIN [facility] SERVICES [address] [place], [place], [address] Phone: [phone]   Fax:  [phone]  Physical Therapy Treatment  Patient Details  Name: Sharon Rodriguez MRN: [id number] Date of Birth: April 18, 1980 Referring Provider: Leafy Ro MD  Encounter Date: 11/25/2015      PT End of Session - 11/30/15 1203    Visit Number 14   Date for PT Re-Evaluation 02/10/16   Authorization Type 4/10   PT Start Time 1340   PT Stop Time 1440   PT Time Calculation (min) 60 min   Activity Tolerance Patient tolerated treatment well;No increased pain   Behavior During Therapy WFL for tasks assessed/performed      Past Medical History:  Diagnosis Date  . ADD (attention deficit disorder)   . Anxiety   . Chronic bronchitis ([place])   . Chronic fatigue   . Chronic pain   . Depression   . Fibromyalgia   . Hypothyroidism 01/27/2015  . Learning disability   . Osteoarthritis   . Rheumatoid arthritis ([place])   . Sleep apnea   . Thyroid disease   . TMJ (dislocation of temporomandibular joint)     Past Surgical History:  Procedure Laterality Date  . CESAREAN SECTION     2    There were no vitals filed for this visit.      Subjective Assessment - 11/30/15 1202    Subjective Pt reports her pelvic pain has resolved and she has been avoiding the [employer]  Pt's sacrum now is the only location that hurts 6/10 intermittently, mostly with standing for 1 hr while cooking.               [facility] PT Assessment - 11/30/15 1202      Palpation   SI assessment  SIJ symmetrical   Palpation comment no mm tensions/ tensions at problem areas from last visit     Ambulation/Gait   Gait Pattern --  R pronation, slight genu valgus                  Pelvic Floor Special Questions - 11/30/15 1206    Pelvic Floor Internal Exam pt consented verbally without contraindication   Exam Type Vaginal   Palpation no mm tensions B    Strength  Flicker  cue for anal contraction to prevent gas elicited Grade 2/5            [facility] Adult PT Treatment/Exercise - 11/30/15 1202      Therapeutic Activites    Other Therapeutic Activities see pt instructions     Neuro Re-ed    Neuro Re-ed Details  see pt instructions                PT Education - 11/30/15 1203    Education provided Yes   Education Details HEP, biopsychosocial approaches   Methods Explanation;Demonstration;Tactile cues;Verbal cues;Handout   Comprehension Returned demonstration;Verbalized understanding;Verbal cues required;Tactile cues required             PT Long Term Goals - 11/18/15 1609      PT LONG TERM GOAL #1   Title Pt will decrease her score on PGQ from 65% to < 50% in order to return to ADLs (6/2: 31%)    Time 12   Period Weeks   Status Achieved     PT LONG TERM GOAL #2   Title Pt will report decreased fatigue based on the FSS by 50% (6.7 pt -->3.7pt)  in order to have energy to improve QOL and have the energy to perform household chores     .  (6/2:  FSS 47.6% ( VAFS 5pt)    Time 12   Period Weeks   Status On-going     PT LONG TERM GOAL #3   Title Pt will demo IND with HEP to maintain  decreased mm spasms of upper neck, shoulders, back in order to better manage fibromyalgia a to  to take care of herself and hr family.     Time 12   Period Weeks   Status Achieved     PT LONG TERM GOAL #4   Title Pt will demo more medially aligned coccyx, mobility along L lateral border of sacrum without tenderness to palpation in order to improve back pain    Time 12   Period Weeks   Status Achieved     PT LONG TERM GOAL #5   Title Pt will report decreased use of stool softeners from 2x/week to 0x/ week for 1 week in order to have easier bowel movements naturally and decreased hemorrhoids   Time 12   Period Weeks   Status Achieved     PT LONG TERM GOAL #6   Title Pt will complete an food diary in order to improve stool consistency from  Type 1 to 3-4.    Time 12   Period Weeks   Status Deferred     PT LONG TERM GOAL #7   Title Pt will report being able to ride on stationery for 10 min without pain afterwards in order to ride a bike with her dtrs.    Time 12   Period Weeks   Status Revised     PT LONG TERM GOAL #8   Title Pt will be perform lifting with proper body mechanics lifting 15-20 pounds  in order to minimize relapse of pain .    Time 12   Period Weeks   Status New               Plan - 11/30/15 1204    Clinical Impression Statement Pt shows good carry over from last session and remains compliant to her PT prescribed exercises and not following youtube videos. Today, pt demo'd no pelvic floor tensions and elicited grade 1/5 contraction. Pt will continue to benefit from coordination training in lieu of strength training due to pt's previous overactive pelvic floor mm.  Pt demo'd new exercises correctly. Pt is progessing well towards her goals.  Suspect pt's R knee c/o is due to pt's genu valgus/ pronation on stance phase. Plan to initiate stair climbing / cycling to increase intensity.     Rehab Potential Good   PT Frequency 2x / week   PT Duration 12 weeks   PT Treatment/Interventions ADLs/Self Care Home Management;Aquatic Therapy;Moist Heat;Electrical Stimulation;Gait training;Traction;Stair training;Functional mobility training;Therapeutic activities;Therapeutic exercise;Neuromuscular re-education;Balance training;Patient/family education;Manual techniques;Scar mobilization;Dry needling;Taping;Splinting;Energy conservation   Consulted and Agree with Plan of Care Patient      Patient will benefit from skilled therapeutic intervention in order to improve the following deficits and impairments:  Obesity, Postural dysfunction, Improper body mechanics, Pain, Increased fascial restricitons, Hypomobility, Decreased balance, Decreased mobility, Decreased coordination, Decreased scar mobility, Decreased safety  awareness, Hypermobility, Difficulty walking, Decreased strength, Decreased range of motion, Decreased endurance, Abnormal gait, Decreased activity tolerance, Increased muscle spasms, Impaired flexibility  Visit Diagnosis: Myalgia  Other abnormalities of gait and mobility  Pain in right knee  Unspecified lack of coordination  Problem List Patient Active Problem List   Diagnosis Date Noted  . Lower limb length difference 02/27/2015  . Hypothyroidism 01/27/2015  . Chronic pelvic pain in female 01/27/2015  . Anxiety   . Rheumatoid arthritis ([place])   . Depression   . Fibromyalgia   . TMJ pain dysfunction syndrome   . Chronic bronchitis ([place])   . Chronic fatigue   . Chronic pain   . ADD (attention deficit disorder)   . Learning disability     Jerl Mina ,PT, DPT, E-RYT  11/30/2015, 12:08 PM  [place] MAIN [facility] SERVICES [address] [place], [place], [address] Phone: [phone]   Fax:  [phone]  Name: Sharon Rodriguez MRN: [id number] Date of Birth: August 25, 1980

## 2015-12-09 ENCOUNTER — Ambulatory Visit: Payer: Medicare Other | Admitting: Physical Therapy

## 2015-12-17 ENCOUNTER — Ambulatory Visit: Payer: Medicare Other | Admitting: Physical Therapy

## 2015-12-23 ENCOUNTER — Ambulatory Visit: Payer: Medicare Other | Admitting: Family Medicine

## 2016-01-04 DIAGNOSIS — Z79899 Other long term (current) drug therapy: Secondary | ICD-10-CM | POA: Diagnosis not present

## 2016-01-04 DIAGNOSIS — F411 Generalized anxiety disorder: Secondary | ICD-10-CM | POA: Diagnosis not present

## 2016-01-04 DIAGNOSIS — F900 Attention-deficit hyperactivity disorder, predominantly inattentive type: Secondary | ICD-10-CM | POA: Diagnosis not present

## 2016-01-04 DIAGNOSIS — F330 Major depressive disorder, recurrent, mild: Secondary | ICD-10-CM | POA: Diagnosis not present

## 2016-01-05 DIAGNOSIS — E559 Vitamin D deficiency, unspecified: Secondary | ICD-10-CM | POA: Diagnosis not present

## 2016-01-05 DIAGNOSIS — Z5181 Encounter for therapeutic drug level monitoring: Secondary | ICD-10-CM | POA: Diagnosis not present

## 2016-01-05 DIAGNOSIS — G8929 Other chronic pain: Secondary | ICD-10-CM | POA: Diagnosis not present

## 2016-01-05 DIAGNOSIS — Z79899 Other long term (current) drug therapy: Secondary | ICD-10-CM | POA: Diagnosis not present

## 2016-01-05 DIAGNOSIS — E538 Deficiency of other specified B group vitamins: Secondary | ICD-10-CM | POA: Diagnosis not present

## 2016-01-05 DIAGNOSIS — Z23 Encounter for immunization: Secondary | ICD-10-CM | POA: Diagnosis not present

## 2016-01-05 DIAGNOSIS — Z79891 Long term (current) use of opiate analgesic: Secondary | ICD-10-CM | POA: Diagnosis not present

## 2016-02-10 ENCOUNTER — Ambulatory Visit: Payer: Medicare Other | Admitting: Family Medicine

## 2016-02-14 ENCOUNTER — Ambulatory Visit (INDEPENDENT_AMBULATORY_CARE_PROVIDER_SITE_OTHER): Payer: Medicare Other | Admitting: Family Medicine

## 2016-02-14 ENCOUNTER — Encounter: Payer: Self-pay | Admitting: Family Medicine

## 2016-02-14 VITALS — BP 112/78 | HR 82 | Temp 97.8°F | Ht 62.25 in | Wt 166.8 lb

## 2016-02-14 DIAGNOSIS — M9904 Segmental and somatic dysfunction of sacral region: Secondary | ICD-10-CM

## 2016-02-14 DIAGNOSIS — G8929 Other chronic pain: Secondary | ICD-10-CM

## 2016-02-14 DIAGNOSIS — M9902 Segmental and somatic dysfunction of thoracic region: Secondary | ICD-10-CM | POA: Diagnosis not present

## 2016-02-14 DIAGNOSIS — R102 Pelvic and perineal pain: Secondary | ICD-10-CM

## 2016-02-14 DIAGNOSIS — M9908 Segmental and somatic dysfunction of rib cage: Secondary | ICD-10-CM

## 2016-02-14 DIAGNOSIS — M9900 Segmental and somatic dysfunction of head region: Secondary | ICD-10-CM | POA: Diagnosis not present

## 2016-02-14 DIAGNOSIS — M9901 Segmental and somatic dysfunction of cervical region: Secondary | ICD-10-CM

## 2016-02-14 DIAGNOSIS — M999 Biomechanical lesion, unspecified: Secondary | ICD-10-CM | POA: Diagnosis not present

## 2016-02-14 DIAGNOSIS — M9905 Segmental and somatic dysfunction of pelvic region: Secondary | ICD-10-CM | POA: Diagnosis not present

## 2016-02-14 DIAGNOSIS — M6289 Other specified disorders of muscle: Secondary | ICD-10-CM | POA: Diagnosis not present

## 2016-02-14 NOTE — Progress Notes (Signed)
BP 112/78 (BP Location: Left Arm, Patient Position: Sitting, Cuff Size: Small)   Pulse 82   Temp 97.8 F (36.6 C)   Ht 5' 2.25" (1.581 m)   Wt 166 lb 12.8 oz (75.7 kg)   LMP 02/07/2016 (Approximate)   BMI 30.26 kg/m    Subjective:    Patient ID: Sharon Rodriguez, female    DOB: 1980-09-24, 35 y.o.   MRN: [id number]  HPI: FARRIN AZZARELLO is a 35 y.o. female  Chief Complaint  Patient presents with  . OMM   Hasini states that she has been doing OK. She has not seen [place] or her pelvic floor therapist in several months, bit has been hanging in there. She notes that her low back and her upper back have been aching again. Worse when she stands or walks for a long period of time, better with OMT and PT and rest. She notes that the pain will radiate into her vaginal area and down her back. It's aching and tight in nature and moderate in severity. She has otherwise been doing OK. She has been using a lot of essential oils to try to make herself feel better, and she thinks that they are helping. She is otherwise doing well with no other concerns or complaints at this time.   Relevant past medical, surgical, family and social history reviewed and updated as indicated. Interim medical history since our last visit reviewed. Allergies and medications reviewed and updated.  Review of Systems  Constitutional: Negative.   Respiratory: Negative.   Cardiovascular: Negative.   Musculoskeletal: Positive for arthralgias, back pain and myalgias. Negative for gait problem, joint swelling, neck pain and neck stiffness.  Neurological: Negative.   Psychiatric/Behavioral: Negative.     Per HPI unless specifically indicated above     Objective:    BP 112/78 (BP Location: Left Arm, Patient Position: Sitting, Cuff Size: Small)   Pulse 82   Temp 97.8 F (36.6 C)   Ht 5' 2.25" (1.581 m)   Wt 166 lb 12.8 oz (75.7 kg)   LMP 02/07/2016 (Approximate)   BMI 30.26 kg/m   Wt Readings from Last 3  Encounters:  02/14/16 166 lb 12.8 oz (75.7 kg)  09/03/15 156 lb (70.8 kg)  07/22/15 157 lb (71.2 kg)    Physical Exam  Constitutional: She is oriented to person, place, and time. She appears well-developed and well-nourished. No distress.  HENT:  Head: Normocephalic and atraumatic.  Right Ear: Hearing normal.  Left Ear: Hearing normal.  Nose: Nose normal.  Eyes: Conjunctivae and lids are normal. Right eye exhibits no discharge. Left eye exhibits no discharge. No scleral icterus.  Pulmonary/Chest: Effort normal. No respiratory distress.  Abdominal: Soft. She exhibits no distension and no mass. There is no tenderness. There is no rebound and no guarding.  Neurological: She is alert and oriented to person, place, and time.  Skin: Skin is warm, dry and intact. No rash noted. No erythema. No pallor.  Psychiatric: She has a normal mood and affect. Her speech is normal and behavior is normal. Judgment and thought content normal. Cognition and memory are normal.  Musculoskeletal:  Exam found Decreased ROM, Tissue texture changes, Tenderness to palpation and Asymmetry of patient's  head, neck, thorax, ribs, lumbar, pelvis, sacrum and abdomen Osteopathic Structural Exam:   Head: hypertonic suboccipital muscles  Neck: [id number], trap spasm on the L  Thorax: [id number], trap spasm on the L  Ribs: Rib 7 locked up on the R  Lumbar:  QL hypertonic on the L  Pelvis: Anterior R innominate  Sacrum: L on L torsion  Abdomen: pelvic diaphragm restricted L>R with fascial strain into the sacrum, diaphragm spasm bilaterally R>L   Results for orders placed or performed in visit on 07/22/15  Lyme Ab/Western Blot Reflex  Result Value Ref Range   Lyme IgG/IgM Ab <0.91 0.00 - 0.90 ISR   LYME DISEASE AB, QUANT, IGM <0.80 0.00 - 0.79 index  Babesia microti Antibody Panel  Result Value Ref Range   Babesia microti IgM <1:10 Neg:<1:10   Babesia microti IgG <1:10 Neg:<1:10  [place] mtn spotted fvr abs pnl(IgG+IgM)    Result Value Ref Range   RMSF IgG Positive (A) Negative   RMSF IgM 0.39 0.00 - [id number] index  Ehrlichia Antibody Panel  Result Value Ref Range   E.Chaffeensis (HME) IgG Negative Neg:<1:64   E. Chaffeensis (HME) IgM Titer Negative Neg:<1:20   HGE IgG Titer Negative Neg:<1:64   HGE IgM Titer Negative Neg:<1:20  RMSF, IgG, IFA  Result Value Ref Range   RMSF, IGG, IFA <1:64 Neg <1:64      Assessment & Plan:   Problem List Items Addressed This Visit      Other   Chronic pelvic pain in female - Primary    This may be due to a leg length discrepancy, but seems to be primarily due to myofascial issues and also likely due 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 Saw podiatry and started on lift. This seemed to help for a couple of weeks, but then stopped helping as much. Also seems to be due to weak core. Discussed need to strengthen this, and she states that she cannot. Doing much better with PT. She does have some somatic dysfunctions that I think are contributing to her symptoms. I think she would benefit from OMT. Patient treated today with fair results. Continue to monitor and recheck in as needed.       Other Visit Diagnoses    Pelvic floor dysfunction       Will get her back into PT. Referral generated today.   Relevant Orders   Ambulatory referral to Physical Therapy   Nonallopathic lesion of abdomen       Rib cage region somatic dysfunction       Sacral region somatic dysfunction       Pelvic region somatic dysfunction       Thoracic region somatic dysfunction       Somatic dysfunction of cervical region       Cranial somatic dysfunction         After verbal consent was obtained, patient was treated today with osteopathic manipulative medicine to the regions of the head, neck, thorax, ribs, lumbar, pelvis, sacrum and abdomen using the techniques of cranial, myofascial release, counterstrain, muscle energy, HVLA and soft tissue. Areas of compensation relating to her primary pain  source also treated. Patient tolerated the procedure well with good objective and good subjective improvement in symptoms. She left the room in good condition. She was advised to stay well hydrated and that she may have some soreness following the procedure. If not improving or worsening, she will call and come in. She will return for reevaluation  in 1-2 months.   Follow up plan: Return in about 4 weeks (around 03/13/2016) for and ASAP for physical.

## 2016-02-14 NOTE — Assessment & Plan Note (Signed)
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 Saw podiatry and started on lift. This seemed to help for a couple of weeks, but then stopped helping as much. Also seems to be due to weak core. Discussed need to strengthen this, and she states that she cannot. Doing much better with PT. She does have some somatic dysfunctions that I think are contributing to her symptoms. I think she would benefit from OMT. Patient treated today with fair results. Continue to monitor and recheck in as needed.

## 2016-03-17 ENCOUNTER — Ambulatory Visit: Payer: Medicare Other | Admitting: Family Medicine

## 2016-03-17 ENCOUNTER — Encounter: Payer: Medicare Other | Admitting: Family Medicine

## 2016-03-20 ENCOUNTER — Encounter: Payer: Self-pay | Admitting: Family Medicine

## 2016-03-20 ENCOUNTER — Ambulatory Visit (INDEPENDENT_AMBULATORY_CARE_PROVIDER_SITE_OTHER): Payer: Medicare Other | Admitting: Family Medicine

## 2016-03-20 VITALS — BP 122/81 | HR 112 | Temp 98.5°F | Wt 168.8 lb

## 2016-03-20 DIAGNOSIS — M9903 Segmental and somatic dysfunction of lumbar region: Secondary | ICD-10-CM | POA: Diagnosis not present

## 2016-03-20 DIAGNOSIS — M9901 Segmental and somatic dysfunction of cervical region: Secondary | ICD-10-CM | POA: Diagnosis not present

## 2016-03-20 DIAGNOSIS — M999 Biomechanical lesion, unspecified: Secondary | ICD-10-CM | POA: Diagnosis not present

## 2016-03-20 DIAGNOSIS — M9904 Segmental and somatic dysfunction of sacral region: Secondary | ICD-10-CM | POA: Diagnosis not present

## 2016-03-20 DIAGNOSIS — M9902 Segmental and somatic dysfunction of thoracic region: Secondary | ICD-10-CM | POA: Diagnosis not present

## 2016-03-20 DIAGNOSIS — R102 Pelvic and perineal pain: Secondary | ICD-10-CM

## 2016-03-20 DIAGNOSIS — G8929 Other chronic pain: Secondary | ICD-10-CM

## 2016-03-20 DIAGNOSIS — M9905 Segmental and somatic dysfunction of pelvic region: Secondary | ICD-10-CM

## 2016-03-20 DIAGNOSIS — M9908 Segmental and somatic dysfunction of rib cage: Secondary | ICD-10-CM | POA: Diagnosis not present

## 2016-03-20 DIAGNOSIS — M9900 Segmental and somatic dysfunction of head region: Secondary | ICD-10-CM | POA: Diagnosis not present

## 2016-03-20 NOTE — Progress Notes (Signed)
BP 122/81 (BP Location: Left Arm, Patient Position: Sitting, Cuff Size: Normal)   Pulse (!) 112   Temp 98.5 F (36.9 C)   Wt 168 lb 12.8 oz (76.6 kg)   SpO2 99%   BMI 30.63 kg/m    Subjective:    Patient ID: Sharon Rodriguez, female    DOB: 03/29/1980, 36 y.o.   MRN: [id number]  HPI: Sharon Rodriguez is a 36 y.o. female  Chief Complaint  Patient presents with  . OMM   Fauna presents today for evaluation of chronic low back pain and upper back pain. She notes that her pain is aching and about a 7/10 at this time. It radiates into her R back and L hip. She notes that it's better with PT and OMT and essential oils and worse with standing for a long period of time or with a lot of exercise. She otherwise has a cold, but is starting to feel better. No other concerns or complaints at this time.   Relevant past medical, surgical, family and social history reviewed and updated as indicated. Interim medical history since our last visit reviewed. Allergies and medications reviewed and updated.  Review of Systems  Constitutional: Negative.   Respiratory: Negative.   Cardiovascular: Negative.   Musculoskeletal: Positive for arthralgias, back pain, joint swelling, myalgias, neck pain and neck stiffness. Negative for gait problem.  Neurological: Negative.   Psychiatric/Behavioral: Negative.     Per HPI unless specifically indicated above     Objective:    BP 122/81 (BP Location: Left Arm, Patient Position: Sitting, Cuff Size: Normal)   Pulse (!) 112   Temp 98.5 F (36.9 C)   Wt 168 lb 12.8 oz (76.6 kg)   SpO2 99%   BMI 30.63 kg/m   Wt Readings from Last 3 Encounters:  03/20/16 168 lb 12.8 oz (76.6 kg)  02/14/16 166 lb 12.8 oz (75.7 kg)  09/03/15 156 lb (70.8 kg)    Physical Exam  Constitutional: She is oriented to person, place, and time. She appears well-developed and well-nourished. No distress.  HENT:  Head: Normocephalic and atraumatic.  Right Ear: Hearing normal.    Left Ear: Hearing normal.  Nose: Nose normal.  Eyes: Conjunctivae and lids are normal. Right eye exhibits no discharge. Left eye exhibits no discharge. No scleral icterus.  Pulmonary/Chest: Effort normal. No respiratory distress.  Abdominal: Soft. She exhibits no distension and no mass. There is no tenderness. There is no rebound and no guarding.  Neurological: She is alert and oriented to person, place, and time.  Skin: Skin is warm, dry and intact. No rash noted. No erythema. No pallor.  Psychiatric: She has a normal mood and affect. Her speech is normal and behavior is normal. Judgment and thought content normal. Cognition and memory are normal.  Nursing note and vitals reviewed. Musculoskeletal:  Exam found Decreased ROM, Tissue texture changes, Tenderness to palpation and Asymmetry of patient's  head, neck, thorax, ribs, lumbar, pelvis, sacrum and abdomen Osteopathic Structural Exam:   Head: OAESSR, OM suture restricted on the R, R temporal internally rotated and anterior  Neck: traps spasm bilaterally R>L, C3ESRR  Thorax: T3-6SRRL  Ribs: Ribs 8-10 locked down on the R  Lumbar: QL hypertonic on the R, L3-4SLRR  Pelvis: anterior rotated R innominate, posterior L innominate  Sacrum:  R on R torsion, SI joint restricted on the R  Abdomen: diaphragm spasm bilaterally, pelvic diaphragm restricted on the L   Results for orders placed or performed  in visit on 07/22/15  Lyme Ab/Western Blot Reflex  Result Value Ref Range   Lyme IgG/IgM Ab <0.91 0.00 - 0.90 ISR   LYME DISEASE AB, QUANT, IGM <0.80 0.00 - 0.79 index  Babesia microti Antibody Panel  Result Value Ref Range   Babesia microti IgM <1:10 Neg:<1:10   Babesia microti IgG <1:10 Neg:<1:10  Rocky mtn spotted fvr abs pnl(IgG+IgM)  Result Value Ref Range   RMSF IgG Positive (A) Negative   RMSF IgM 0.39 0.00 - 123456 index  Ehrlichia Antibody Panel  Result Value Ref Range   E.Chaffeensis (HME) IgG Negative Neg:<1:64   E. Chaffeensis  (HME) IgM Titer Negative Neg:<1:20   HGE IgG Titer Negative Neg:<1:64   HGE IgM Titer Negative Neg:<1:20  RMSF, IgG, IFA  Result Value Ref Range   RMSF, IGG, IFA <1:64 Neg <1:64      Assessment & Plan:   Problem List Items Addressed This Visit      Other   Chronic pelvic pain in female - Primary    This may be due to a leg length discrepancy, but seems to be primarily due to myofascial issues and also likely due 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 Saw podiatry and started on lift. This seemed to help for a couple of weeks, but then stopped helping as much. Also seems to be due to weak core. Discussed need to strengthen this, and she states that she cannot. Doing much better with PT, but has finished that now. She does have some somatic dysfunctions that I think are contributing to her symptoms. I think she would benefit from OMT. Patient treated today with fair results. Continue to monitor and recheck in as needed.       Other Visit Diagnoses    Nonallopathic lesion of abdomen       Rib cage region somatic dysfunction       Sacral region somatic dysfunction       Pelvic region somatic dysfunction       Thoracic region somatic dysfunction       Somatic dysfunction of cervical region       Cranial somatic dysfunction       Somatic dysfunction of spine, lumbar         After verbal consent was obtained, patient was treated today with osteopathic manipulative medicine to the regions of the head, neck, thorax, ribs, lumbar, pelvis, sacrum and abdomen using the techniques of cranial, myofascial release, counterstrain, muscle energy, HVLA and soft tissue. Areas of compensation relating to her primary pain source also treated. Patient tolerated the procedure well with good objective and good subjective improvement in symptoms. She left the room in good condition. She was advised to stay well hydrated and that she may have some soreness following the procedure. If not improving or worsening,  she will call and come in. She will return for reevaluation   in 2-3 weeks.   Follow up plan: Return 2-3 weeks, for OMT eval.

## 2016-03-20 NOTE — Assessment & Plan Note (Signed)
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 Saw podiatry and started on lift. This seemed to help for a couple of weeks, but then stopped helping as much. Also seems to be due to weak core. Discussed need to strengthen this, and she states that she cannot. Doing much better with PT, but has finished that now. She does have some somatic dysfunctions that I think are contributing to her symptoms. I think she would benefit from OMT. Patient treated today with fair results. Continue to monitor and recheck in as needed.

## 2016-04-06 DIAGNOSIS — Z79899 Other long term (current) drug therapy: Secondary | ICD-10-CM | POA: Diagnosis not present

## 2016-04-06 DIAGNOSIS — F900 Attention-deficit hyperactivity disorder, predominantly inattentive type: Secondary | ICD-10-CM | POA: Diagnosis not present

## 2016-04-06 DIAGNOSIS — F330 Major depressive disorder, recurrent, mild: Secondary | ICD-10-CM | POA: Diagnosis not present

## 2016-04-06 DIAGNOSIS — F411 Generalized anxiety disorder: Secondary | ICD-10-CM | POA: Diagnosis not present

## 2016-04-07 DIAGNOSIS — M797 Fibromyalgia: Secondary | ICD-10-CM | POA: Diagnosis not present

## 2016-04-07 DIAGNOSIS — Z79899 Other long term (current) drug therapy: Secondary | ICD-10-CM | POA: Diagnosis not present

## 2016-04-14 ENCOUNTER — Encounter: Payer: Medicare Other | Admitting: Physical Therapy

## 2016-04-17 ENCOUNTER — Ambulatory Visit: Payer: Medicare Other | Admitting: Family Medicine

## 2016-04-18 ENCOUNTER — Ambulatory Visit: Payer: Medicare Other | Admitting: Family Medicine

## 2016-05-01 ENCOUNTER — Ambulatory Visit: Payer: Medicare Other | Admitting: Physical Therapy

## 2016-05-08 ENCOUNTER — Encounter: Payer: Medicare Other | Admitting: Physical Therapy

## 2016-05-15 ENCOUNTER — Encounter: Payer: Medicare Other | Admitting: Physical Therapy

## 2016-05-22 ENCOUNTER — Encounter: Payer: Medicare Other | Admitting: Physical Therapy

## 2016-06-05 ENCOUNTER — Encounter: Payer: Medicare Other | Admitting: Physical Therapy

## 2016-06-19 ENCOUNTER — Encounter: Payer: Medicare Other | Admitting: Physical Therapy

## 2016-06-19 DIAGNOSIS — R200 Anesthesia of skin: Secondary | ICD-10-CM | POA: Diagnosis not present

## 2016-06-19 DIAGNOSIS — R202 Paresthesia of skin: Secondary | ICD-10-CM | POA: Diagnosis not present

## 2016-06-19 DIAGNOSIS — R5383 Other fatigue: Secondary | ICD-10-CM | POA: Diagnosis not present

## 2016-06-19 DIAGNOSIS — G4730 Sleep apnea, unspecified: Secondary | ICD-10-CM | POA: Diagnosis not present

## 2016-06-19 DIAGNOSIS — R42 Dizziness and giddiness: Secondary | ICD-10-CM | POA: Diagnosis not present

## 2016-06-19 DIAGNOSIS — F419 Anxiety disorder, unspecified: Secondary | ICD-10-CM | POA: Diagnosis not present

## 2016-06-19 LAB — TSH: TSH: 2.54 (ref 0.41–5.90)

## 2016-06-22 ENCOUNTER — Telehealth: Payer: Self-pay

## 2016-06-22 NOTE — Telephone Encounter (Signed)
Attempted to reach to schedule for [employer] Wellness. Message left for pt to return call.   

## 2016-06-27 DIAGNOSIS — F900 Attention-deficit hyperactivity disorder, predominantly inattentive type: Secondary | ICD-10-CM | POA: Diagnosis not present

## 2016-06-27 DIAGNOSIS — F330 Major depressive disorder, recurrent, mild: Secondary | ICD-10-CM | POA: Diagnosis not present

## 2016-06-27 DIAGNOSIS — Z79899 Other long term (current) drug therapy: Secondary | ICD-10-CM | POA: Diagnosis not present

## 2016-06-27 DIAGNOSIS — F411 Generalized anxiety disorder: Secondary | ICD-10-CM | POA: Diagnosis not present

## 2016-06-28 DIAGNOSIS — M797 Fibromyalgia: Secondary | ICD-10-CM | POA: Diagnosis not present

## 2016-06-28 DIAGNOSIS — Z8739 Personal history of other diseases of the musculoskeletal system and connective tissue: Secondary | ICD-10-CM | POA: Diagnosis not present

## 2016-06-28 DIAGNOSIS — G894 Chronic pain syndrome: Secondary | ICD-10-CM | POA: Diagnosis not present

## 2016-06-29 ENCOUNTER — Inpatient Hospital Stay: Payer: Medicare Other | Admitting: Family Medicine

## 2016-07-03 ENCOUNTER — Encounter: Payer: Medicare Other | Admitting: Physical Therapy

## 2016-07-06 ENCOUNTER — Ambulatory Visit (INDEPENDENT_AMBULATORY_CARE_PROVIDER_SITE_OTHER): Payer: Medicare Other | Admitting: Family Medicine

## 2016-07-06 ENCOUNTER — Encounter: Payer: Self-pay | Admitting: Family Medicine

## 2016-07-06 VITALS — BP 112/75 | HR 88 | Temp 98.3°F | Wt 173.0 lb

## 2016-07-06 DIAGNOSIS — R5382 Chronic fatigue, unspecified: Secondary | ICD-10-CM | POA: Diagnosis not present

## 2016-07-06 DIAGNOSIS — M2550 Pain in unspecified joint: Secondary | ICD-10-CM | POA: Diagnosis not present

## 2016-07-06 DIAGNOSIS — W57XXXA Bitten or stung by nonvenomous insect and other nonvenomous arthropods, initial encounter: Secondary | ICD-10-CM

## 2016-07-06 DIAGNOSIS — E538 Deficiency of other specified B group vitamins: Secondary | ICD-10-CM | POA: Diagnosis not present

## 2016-07-06 DIAGNOSIS — R5383 Other fatigue: Secondary | ICD-10-CM | POA: Diagnosis not present

## 2016-07-06 DIAGNOSIS — E559 Vitamin D deficiency, unspecified: Secondary | ICD-10-CM

## 2016-07-06 DIAGNOSIS — M26629 Arthralgia of temporomandibular joint, unspecified side: Secondary | ICD-10-CM

## 2016-07-06 DIAGNOSIS — R202 Paresthesia of skin: Secondary | ICD-10-CM | POA: Diagnosis not present

## 2016-07-06 DIAGNOSIS — M791 Myalgia: Secondary | ICD-10-CM | POA: Diagnosis not present

## 2016-07-06 LAB — VITAMIN D 25 HYDROXY (VIT D DEFICIENCY, FRACTURES): Vit D, 25-Hydroxy: 21.6

## 2016-07-06 MED ORDER — LEVOTHYROXINE SODIUM 50 MCG PO TABS: 50.0000 ug | ORAL_TABLET | Freq: Every day | ORAL | 3 refills | Status: DC

## 2016-07-06 MED ORDER — CYCLOBENZAPRINE HCL 10 MG PO TABS: 10.0000 mg | ORAL_TABLET | Freq: Every day | ORAL | 0 refills | Status: DC

## 2016-07-06 NOTE — Assessment & Plan Note (Signed)
Likely the cause of her dizziness and tingling due to nerve irritation. Will start flexeril. Will come in for OMT evaluation on Monday.

## 2016-07-06 NOTE — Assessment & Plan Note (Signed)
Checking labs. Likely multifactorial and involving her medications and autoimmune issues. Await results.

## 2016-07-06 NOTE — Assessment & Plan Note (Signed)
Rechecking levels today. Await results. Does not appear to have been checked this calendar year.

## 2016-07-06 NOTE — Progress Notes (Signed)
BP 112/75 (BP Location: Left Arm, Patient Position: Sitting, Cuff Size: Normal)   Pulse 88   Temp 98.3 F (36.8 C)   Wt 173 lb (78.5 kg)   LMP 06/29/2016 (Approximate)   SpO2 100%   BMI 31.39 kg/m    Subjective:    Patient ID: Sharon Rodriguez, female    DOB: Sep 27, 1980, 36 y.o.   MRN: [id number]  HPI: Sharon Rodriguez is a 36 y.o. female  Chief Complaint  Patient presents with  . Hospitalization Follow-up  . Hypothyroidism  . Vitamin D defiency   ER FOLLOW UP- 6 weeks ago, started with muscle spasm and tension in her jaw, had some numbness and tingling that she doesn't know where it was coming from. Has been having some pain and tightness in the L side of her neck and into her head. Has been having some slurring of her speech. She notes that it comes and goes. She doesn't notice any time of day. She notes that she has been having issues with her  Time since discharge: 3 weeks ago Hospital/facility: [facility]  Diagnosis: Numbness and Tingling Procedures/tests: CT of head- negative, Labs- negative Consultants: None New medications: None Discharge instructions:  Follow up here.  Status: stable   HYPOTHYROIDISM Thyroid control status:controlled Satisfied with current treatment? yes Medication side effects: no Medication compliance: excellent compliance Recent dose adjustment:no Fatigue: yes Cold intolerance: no Heat intolerance: no Weight gain: no Weight loss: no Constipation: yes Diarrhea/loose stools: no Palpitations: yes Lower extremity edema: no Anxiety/depressed mood: yes  Vitamin D not checked last visit. Had a tick bite about 3 weeks ago, negative lyme at ER, but previously. Would like to recheck labs.    Relevant past medical, surgical, family and social history reviewed and updated as indicated. Interim medical history since our last visit reviewed. Allergies and medications reviewed and updated.  Review of Systems  Constitutional: Negative.     Respiratory: Negative.   Cardiovascular: Negative.   Neurological: Positive for numbness. Negative for dizziness, tremors, seizures, syncope, facial asymmetry, speech difficulty, weakness, light-headedness and headaches.  Psychiatric/Behavioral: Negative.     Per HPI unless specifically indicated above     Objective:    BP 112/75 (BP Location: Left Arm, Patient Position: Sitting, Cuff Size: Normal)   Pulse 88   Temp 98.3 F (36.8 C)   Wt 173 lb (78.5 kg)   LMP 06/29/2016 (Approximate)   SpO2 100%   BMI 31.39 kg/m   Wt Readings from Last 3 Encounters:  07/06/16 173 lb (78.5 kg)  03/20/16 168 lb 12.8 oz (76.6 kg)  02/14/16 166 lb 12.8 oz (75.7 kg)    Physical Exam  Constitutional: She is oriented to person, place, and time. She appears well-developed and well-nourished. No distress.  HENT:  Head: Normocephalic and atraumatic.  Right Ear: Hearing normal.  Left Ear: Hearing normal.  Nose: Nose normal.  Eyes: Conjunctivae and lids are normal. Right eye exhibits no discharge. Left eye exhibits no discharge. No scleral icterus.  Cardiovascular: Normal rate, regular rhythm, normal heart sounds and intact distal pulses.  Exam reveals no gallop and no friction rub.   No murmur heard. Pulmonary/Chest: Effort normal and breath sounds normal. No respiratory distress. She has no wheezes. She has no rales. She exhibits no tenderness.  Musculoskeletal: Normal range of motion. She exhibits no edema, tenderness or deformity.  Neurological: She is alert and oriented to person, place, and time. She has normal reflexes. She displays normal reflexes. No cranial  nerve deficit. She exhibits normal muscle tone. Coordination normal.  Skin: Skin is warm, dry and intact. No rash noted. No erythema. No pallor.  Psychiatric: She has a normal mood and affect. Her speech is normal and behavior is normal. Judgment and thought content normal. Cognition and memory are normal.  Nursing note and vitals  reviewed.   Results for orders placed or performed in visit on 07/22/15  Lyme Ab/Western Blot Reflex  Result Value Ref Range   Lyme IgG/IgM Ab <0.91 0.00 - 0.90 ISR   LYME DISEASE AB, QUANT, IGM <0.80 0.00 - 0.79 index  Babesia microti Antibody Panel  Result Value Ref Range   Babesia microti IgM <1:10 Neg:<1:10   Babesia microti IgG <1:10 Neg:<1:10  Rocky mtn spotted fvr abs pnl(IgG+IgM)  Result Value Ref Range   RMSF IgG Positive (A) Negative   RMSF IgM 0.39 0.00 - 4.70 index  Ehrlichia Antibody Panel  Result Value Ref Range   E.Chaffeensis (HME) IgG Negative Neg:<1:64   E. Chaffeensis (HME) IgM Titer Negative Neg:<1:20   HGE IgG Titer Negative Neg:<1:64   HGE IgM Titer Negative Neg:<1:20  RMSF, IgG, IFA  Result Value Ref Range   RMSF, IGG, IFA <1:64 Neg <1:64      Assessment & Plan:   Problem List Items Addressed This Visit      Musculoskeletal and Integument   TMJ pain dysfunction syndrome    Likely the cause of her dizziness and tingling due to nerve irritation. Will start flexeril. Will come in for OMT evaluation on Monday.        Other   Chronic fatigue - Primary    Checking labs. Likely multifactorial and involving her medications and autoimmune issues. Await results.       Vitamin D deficiency    Rechecking levels today. Await results. Does not appear to have been checked this calendar year.        Other Visit Diagnoses    Tick bite, initial encounter       Lyme only checked in ER- will check other tick diseases and will recheck lyme as it has now been 3-4 weeks since the bite. Await results.    Relevant Orders   Lyme Ab/Western Blot Reflex   Rocky mtn spotted fvr abs pnl(IgG+IgM)   Babesia microti Antibody Panel   Ehrlichia Antibody Panel   Paresthesias       Likely due to nerve irritation from TMJ- will check labs. Start muscle relaxer. OMT on Monday.   B12 deficiency       Will recheck levels. Last check was hemolyzed. Await results.         Follow up plan: Return As scheduled.

## 2016-07-07 ENCOUNTER — Other Ambulatory Visit: Payer: Self-pay

## 2016-07-10 ENCOUNTER — Ambulatory Visit: Payer: Medicare Other | Admitting: Family Medicine

## 2016-07-11 ENCOUNTER — Telehealth: Payer: Self-pay | Admitting: Family Medicine

## 2016-07-11 NOTE — Telephone Encounter (Signed)
Please let Rebbeca know that all her tick tests came back negative. Thanks!

## 2016-07-11 NOTE — Telephone Encounter (Signed)
Patient notified

## 2016-07-12 LAB — BABESIA MICROTI ANTIBODY PANEL

## 2016-07-12 LAB — EHRLICHIA ANTIBODY PANEL
E. Chaffeensis (HME) IgM Titer: NEGATIVE
E.Chaffeensis (HME) IgG: NEGATIVE
HGE IGG TITER: NEGATIVE
HGE IGM TITER: NEGATIVE

## 2016-07-12 LAB — LYME AB/WESTERN BLOT REFLEX
LYME DISEASE AB, QUANT, IGM: <0.8 index (ref 0.00–0.79)
Lyme IgG/IgM Ab: <0.91 {ISR} (ref 0.00–0.90)

## 2016-07-12 LAB — ROCKY MTN SPOTTED FVR ABS PNL(IGG+IGM)
RMSF IgG: NEGATIVE
RMSF IgM: 0.47 {index} (ref 0.00–0.89)

## 2016-08-29 DIAGNOSIS — F900 Attention-deficit hyperactivity disorder, predominantly inattentive type: Secondary | ICD-10-CM | POA: Diagnosis not present

## 2016-08-29 DIAGNOSIS — F330 Major depressive disorder, recurrent, mild: Secondary | ICD-10-CM | POA: Diagnosis not present

## 2016-08-29 DIAGNOSIS — F411 Generalized anxiety disorder: Secondary | ICD-10-CM | POA: Diagnosis not present

## 2016-09-26 ENCOUNTER — Telehealth: Payer: Self-pay

## 2016-09-26 ENCOUNTER — Encounter: Payer: Self-pay | Admitting: Family Medicine

## 2016-09-26 NOTE — Telephone Encounter (Signed)
She had a years supply of her synthroid given in April. She shouldn't be due. She doesn't have a vitamin D since her last Rx- and her last one was just barely not normal, so she needs a recheck on her vitamin D before it can be refilled.

## 2016-09-26 NOTE — Telephone Encounter (Signed)
Called patient to see if she requested Lidocaine ointment from [facility], patient states that she requested the liquid. She states that she does not need it if it is the ointment.  Patient is requesting a refill on her Vitamin D and Synthroid BRAND name only. I do not see where she has had her TSH checked in over a year, but she states that it was checked last time she was in the office.  She would like these to be sent to [facility] in [facility]

## 2016-09-26 NOTE — Telephone Encounter (Signed)
Left message on machine for pt to return call to the office.  Did verify w/ [facility] that they did have Synthroid RX.

## 2016-09-27 NOTE — Telephone Encounter (Signed)
Called patient, no answer, left message for patient to return my call.  

## 2016-09-28 NOTE — Telephone Encounter (Signed)
Patient notified

## 2016-09-28 NOTE — Telephone Encounter (Signed)
Patient states that she was actually taking the 50,000 units of Vitamin D in April when it was checked.  Please call and discuss this with the patient.

## 2016-09-29 NOTE — Telephone Encounter (Signed)
Patient is fine, she will talk to you Tuesday.

## 2016-09-29 NOTE — Telephone Encounter (Signed)
Appointment scheduled for 10/03/2016.

## 2016-09-29 NOTE — Telephone Encounter (Signed)
I see patient is coming in on Tuesday- can you please find out if there is anything she needs from me before I see her??

## 2016-10-03 ENCOUNTER — Encounter: Payer: Self-pay | Admitting: Family Medicine

## 2016-10-03 ENCOUNTER — Ambulatory Visit (INDEPENDENT_AMBULATORY_CARE_PROVIDER_SITE_OTHER): Payer: Medicare Other | Admitting: Family Medicine

## 2016-10-03 VITALS — BP 101/70 | HR 114 | Temp 98.7°F | Wt 172.8 lb

## 2016-10-03 DIAGNOSIS — M26629 Arthralgia of temporomandibular joint, unspecified side: Secondary | ICD-10-CM | POA: Diagnosis not present

## 2016-10-03 DIAGNOSIS — E559 Vitamin D deficiency, unspecified: Secondary | ICD-10-CM | POA: Diagnosis not present

## 2016-10-03 DIAGNOSIS — M069 Rheumatoid arthritis, unspecified: Secondary | ICD-10-CM | POA: Diagnosis not present

## 2016-10-03 DIAGNOSIS — R102 Pelvic and perineal pain: Secondary | ICD-10-CM | POA: Diagnosis not present

## 2016-10-03 DIAGNOSIS — Z79899 Other long term (current) drug therapy: Secondary | ICD-10-CM | POA: Insufficient documentation

## 2016-10-03 DIAGNOSIS — M797 Fibromyalgia: Secondary | ICD-10-CM | POA: Diagnosis not present

## 2016-10-03 DIAGNOSIS — G8929 Other chronic pain: Secondary | ICD-10-CM

## 2016-10-03 MED ORDER — OXYCODONE-ACETAMINOPHEN 5-325 MG PO TABS
1.0000 | ORAL_TABLET | Freq: Four times a day (QID) | ORAL | 0 refills | Status: DC | PRN
Start: 2016-10-03 — End: 2016-11-03

## 2016-10-03 NOTE — Assessment & Plan Note (Signed)
See other chronic pain discussion from today.

## 2016-10-03 NOTE — Progress Notes (Signed)
BP 101/70   Pulse (!) 114   Temp 98.7 F (37.1 C)   Wt 172 lb 12.8 oz (78.4 kg)   LMP 09/06/2016 (Approximate)   SpO2 100%   BMI 31.35 kg/m    Subjective:    Patient ID: Sharon Rodriguez, female    DOB: September 26, 1980, 36 y.o.   MRN: [id number]  HPI: Sharon Rodriguez is a 36 y.o. female  Chief Complaint  Patient presents with  . Labs Only    vitamin d recheck    Vitamin D deficiency- would like her levels rechecked. Hasn't been on meds in a couple of months. Last recheck was normal. She states that she was on the big dose. She notes that she has been [place] and wonders if that is contributing.   Has been seeing rheumatology who has been prescribing her pain medicine. They are stopping and she is to see a new pain clinic on 10/23/16- however, this is a [facility] pain clinic and is in [place]. She has a lot of trouble with transportation and cannot get there to be seen. She would like to see a pain clinic closer. PMP reviewed today and appropriate. She has been getting 150 pills a month of percocet from the same doctor. She is also taking xanax from her psychiatrist. Notes from her rheumatologist reviewed. Appropriate drug screen in April.  CHRONIC PAIN  Present dose:  37.5 Morphine equivalents Pain control status: stable Duration: chronic Location: wide spread Quality: aching and tight and throbbing Current Pain Level: mild Previous Pain Level: severe Breakthrough pain: no Benefit from narcotic medications: yes What Activities task can be accomplished with current medication?- able to take care of her children Interested in weaning off narcotics:no   Stool softners/OTC fiber: no  Previous pain specialty evaluation: yes Non-narcotic analgesic meds: no Narcotic contract: yes   Relevant past medical, surgical, family and social history reviewed and updated as indicated. Interim medical history since our last visit reviewed. Allergies and medications reviewed and updated.  Review of  Systems  Constitutional: Negative.   Respiratory: Negative.   Cardiovascular: Negative.   Musculoskeletal: Positive for arthralgias, back pain, myalgias, neck pain and neck stiffness. Negative for gait problem and joint swelling.  Skin: Negative.   Psychiatric/Behavioral: Positive for dysphoric mood. Negative for agitation, behavioral problems, confusion, decreased concentration, hallucinations, self-injury, sleep disturbance and suicidal ideas. The patient is nervous/anxious. The patient is not hyperactive.     Per HPI unless specifically indicated above     Objective:    BP 101/70   Pulse (!) 114   Temp 98.7 F (37.1 C)   Wt 172 lb 12.8 oz (78.4 kg)   LMP 09/06/2016 (Approximate)   SpO2 100%   BMI 31.35 kg/m   Wt Readings from Last 3 Encounters:  10/03/16 172 lb 12.8 oz (78.4 kg)  07/06/16 173 lb (78.5 kg)  03/20/16 168 lb 12.8 oz (76.6 kg)    Physical Exam  Constitutional: She is oriented to person, place, and time. She appears well-developed and well-nourished. No distress.  HENT:  Head: Normocephalic and atraumatic.  Right Ear: Hearing normal.  Left Ear: Hearing normal.  Nose: Nose normal.  Eyes: Conjunctivae and lids are normal. Right eye exhibits no discharge. Left eye exhibits no discharge. No scleral icterus.  Cardiovascular: Normal rate, regular rhythm, normal heart sounds and intact distal pulses.  Exam reveals no gallop and no friction rub.   No murmur heard. Pulmonary/Chest: Effort normal and breath sounds normal. No respiratory distress.  She has no wheezes. She has no rales. She exhibits no tenderness.  Musculoskeletal: Normal range of motion.  Neurological: She is alert and oriented to person, place, and time.  Skin: Skin is warm, dry and intact. No rash noted. She is not diaphoretic. No erythema. No pallor.  Psychiatric: She has a normal mood and affect. Her speech is normal and behavior is normal. Judgment and thought content normal. Cognition and memory are  normal.  Nursing note and vitals reviewed.   Results for orders placed or performed in visit on 09/26/16  TSH  Result Value Ref Range   TSH 2.54 0.41 - 5.90      Assessment & Plan:   Problem List Items Addressed This Visit      Musculoskeletal and Integument   Rheumatoid arthritis ([place])    Continue to follow with rheumatology. Call with any concerns. We will take over her percocet given her difficulty with transportation. Discussed with patient that we do not prescribe long term pain medications, but that we would be happy to bridge her until she is able to get in with the pain clinic. Referral to local pain clinic made today. PMP reviewed and appropriate. Last note from her rheumatologist, who has been prescribing her medication reviewed and appropriate. Drug screen done at rheumatology appropriate. We attempted to call rheumatology to confirm it's OK we start taking over her medicine, however, we had to leave a message. Patient is comfortable with [place] taking over. Controlled substance agreement signed today- see scanned document. Rx for what she had been getting given today. Recheck 1 month.       Relevant Medications   oxyCODONE-acetaminophen (PERCOCET) 5-325 MG tablet   Other Relevant Orders   Ambulatory referral to Pain Clinic   TMJ pain dysfunction syndrome    See other chronic pain discussion from today.      Relevant Orders   Ambulatory referral to Pain Clinic     Other   Fibromyalgia    See other chronic pain discussion from today.      Relevant Orders   Ambulatory referral to Pain Clinic   Chronic pain - Primary    Continue to follow with rheumatology. Call with any concerns. We will take over her percocet given her difficulty with transportation. Discussed with patient that we do not prescribe long term pain medications, but that we would be happy to bridge her until she is able to get in with the pain clinic. Referral to local pain clinic made today. PMP reviewed and  appropriate. Last note from her rheumatologist, who has been prescribing her medication reviewed and appropriate. Drug screen done at rheumatology appropriate. We attempted to call rheumatology to confirm it's OK we start taking over her medicine, however, we had to leave a message. Patient is comfortable with [place] taking over. Controlled substance agreement signed today- see scanned document. Rx for what she had been getting given today. Recheck 1 month.       Relevant Medications   oxyCODONE-acetaminophen (PERCOCET) 5-325 MG tablet   Other Relevant Orders   Ambulatory referral to Pain Clinic   Chronic pelvic pain in female    See other chronic pain discussion from today.      Relevant Medications   oxyCODONE-acetaminophen (PERCOCET) 5-325 MG tablet   Other Relevant Orders   Ambulatory referral to Pain Clinic   Vitamin D deficiency    Rechecking levels today. Will treat as needed. Await results.       Relevant Orders  VITAMIN D 25 Hydroxy (Vit-D Deficiency, Fractures)   Controlled substance agreement signed    For percocet- we are aware that she sees psychiatry for other controlled substances.           Follow up plan: Return in about 4 weeks (around 10/31/2016) for Follow up pain.

## 2016-10-03 NOTE — Assessment & Plan Note (Signed)
Continue to follow with rheumatology. Call with any concerns. We will take over her percocet given her difficulty with transportation. Discussed with patient that we do not prescribe long term pain medications, but that we would be happy to bridge her until she is able to get in with the pain clinic. Referral to local pain clinic made today. PMP reviewed and appropriate. Last note from her rheumatologist, who has been prescribing her medication reviewed and appropriate. Drug screen done at rheumatology appropriate. We attempted to call rheumatology to confirm it's OK we start taking over her medicine, however, we had to leave a message. Patient is comfortable with [place] taking over. Controlled substance agreement signed today- see scanned document. Rx for what she had been getting given today. Recheck 1 month.

## 2016-10-03 NOTE — Assessment & Plan Note (Signed)
Rechecking levels today. Will treat as needed. Await results.

## 2016-10-03 NOTE — Assessment & Plan Note (Signed)
For percocet- we are aware that she sees psychiatry for other controlled substances.

## 2016-10-04 ENCOUNTER — Telehealth: Payer: Self-pay | Admitting: Family Medicine

## 2016-10-04 LAB — VITAMIN D 25 HYDROXY (VIT D DEFICIENCY, FRACTURES): VIT D 25 HYDROXY: 18.4 ng/mL — AB (ref 30.0–100.0)

## 2016-10-04 MED ORDER — VITAMIN D (ERGOCALCIFEROL) 1.25 MG (50000 UNIT) PO CAPS: 50000.0000 [IU] | ORAL_CAPSULE | ORAL | 0 refills | Status: DC

## 2016-10-04 NOTE — Telephone Encounter (Signed)
Please let her know that her vitamin D was low again, so I've sent a Rx to her pharmacy. I have not heard anything from her rheumatologist.

## 2016-10-04 NOTE — Telephone Encounter (Signed)
Left patient a message letting her know her results.

## 2016-10-06 ENCOUNTER — Telehealth: Payer: Self-pay | Admitting: Family Medicine

## 2016-10-06 NOTE — Telephone Encounter (Signed)
Routing to provider  

## 2016-10-06 NOTE — Telephone Encounter (Signed)
Received a call from [facility] --Dr Clyde Lundborg states that it is fine for Dr Wynetta Emery to prescribe the percocet but she will transfer the pain management to Dr Wynetta Emery.  Thank you

## 2016-10-06 NOTE — Telephone Encounter (Signed)
No worries, that was what we were intending- can you let [facility] know this? Thanks!

## 2016-10-06 NOTE — Telephone Encounter (Signed)
Patient notified

## 2016-10-10 ENCOUNTER — Telehealth: Payer: Self-pay | Admitting: Family Medicine

## 2016-10-10 NOTE — Telephone Encounter (Signed)
Would like to know if fax was received regarding medical records. If not Berdine Addison will fax or mail them over.   Please Advise.  Thank you

## 2016-10-10 NOTE — Telephone Encounter (Signed)
Tiffany: We do not handle medical records. That is something that the front office takes care of. Is it a records release or records we were to receive.

## 2016-10-10 NOTE — Telephone Encounter (Signed)
Sharon Rodriguez: She is asking to send medical records over to [place]. I was not sure if you had received them yet.

## 2016-10-10 NOTE — Telephone Encounter (Signed)
Ok, no we have not received them yet, please let her know this.

## 2016-10-10 NOTE — Telephone Encounter (Signed)
Called back and informed them it has not been received yet. Informed from Sharon Rodriguez that she has just faxed them over and we should be receiving them.

## 2016-10-18 ENCOUNTER — Telehealth: Payer: Self-pay | Admitting: Family Medicine

## 2016-10-18 NOTE — Telephone Encounter (Signed)
This is what we discussed. I agree. Thanks!

## 2016-10-18 NOTE — Telephone Encounter (Signed)
FYI-Pt called and stated that since she will be going to [facility] pain clinic that she would not be going to her appt with [facility] pain clinic.

## 2016-11-03 ENCOUNTER — Ambulatory Visit (INDEPENDENT_AMBULATORY_CARE_PROVIDER_SITE_OTHER): Payer: Medicare Other | Admitting: Family Medicine

## 2016-11-03 ENCOUNTER — Encounter: Payer: Self-pay | Admitting: Family Medicine

## 2016-11-03 VITALS — BP 121/85 | HR 91 | Temp 98.7°F | Ht 62.25 in | Wt 176.0 lb

## 2016-11-03 DIAGNOSIS — W57XXXA Bitten or stung by nonvenomous insect and other nonvenomous arthropods, initial encounter: Secondary | ICD-10-CM

## 2016-11-03 DIAGNOSIS — G8929 Other chronic pain: Secondary | ICD-10-CM | POA: Diagnosis not present

## 2016-11-03 DIAGNOSIS — R102 Pelvic and perineal pain: Secondary | ICD-10-CM | POA: Diagnosis not present

## 2016-11-03 MED ORDER — CYCLOBENZAPRINE HCL 10 MG PO TABS: 10.0000 mg | ORAL_TABLET | Freq: Every day | ORAL | 3 refills | Status: DC

## 2016-11-03 MED ORDER — TRIAMCINOLONE ACETONIDE 0.5 % EX OINT: 1.0000 "application " | TOPICAL_OINTMENT | Freq: Two times a day (BID) | CUTANEOUS | 0 refills | Status: DC

## 2016-11-03 MED ORDER — NAPROXEN 500 MG PO TABS: 500.0000 mg | ORAL_TABLET | Freq: Three times a day (TID) | ORAL | 1 refills | Status: DC

## 2016-11-03 MED ORDER — OXYCODONE-ACETAMINOPHEN 5-325 MG PO TABS: 1.0000 | ORAL_TABLET | Freq: Four times a day (QID) | ORAL | 0 refills | Status: DC | PRN
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
## 2016-11-03 NOTE — Assessment & Plan Note (Signed)
We have taken over her chronic pain management from rheumatology due to issues with transportation. She is aware that we do not do long term pain management but will bridge her until she gets into see pain management. Would like to see pain management at [facility] to help with transportation issues. Referral generated last visit. She has an  ppointment with Dr. Holley Raring at [facility] pain clinic scheduled for 12/05/16 at 11 AM.  We know that they will not start prescribing medication for the first few months. We will continue to bridge her chronic pain medicine until she sees them and they determine if they will take over or not. PMP reviewed today and appropriate. Refill of her medicine given today. Follow up 1 month.

## 2016-11-03 NOTE — Assessment & Plan Note (Signed)
Would like OMM today, however, not booked appropriately for that- will get her back ASAP for OMT eval.

## 2016-11-09 ENCOUNTER — Ambulatory Visit (INDEPENDENT_AMBULATORY_CARE_PROVIDER_SITE_OTHER): Payer: Medicare Other | Admitting: Family Medicine

## 2016-11-09 ENCOUNTER — Encounter: Payer: Self-pay | Admitting: Family Medicine

## 2016-11-09 ENCOUNTER — Telehealth: Payer: Self-pay | Admitting: Family Medicine

## 2016-11-09 VITALS — BP 128/87 | HR 99 | Temp 98.8°F | Wt 173.5 lb

## 2016-11-09 DIAGNOSIS — G8929 Other chronic pain: Secondary | ICD-10-CM

## 2016-11-09 DIAGNOSIS — M9903 Segmental and somatic dysfunction of lumbar region: Secondary | ICD-10-CM

## 2016-11-09 DIAGNOSIS — M9905 Segmental and somatic dysfunction of pelvic region: Secondary | ICD-10-CM

## 2016-11-09 DIAGNOSIS — R102 Pelvic and perineal pain: Secondary | ICD-10-CM | POA: Diagnosis not present

## 2016-11-09 DIAGNOSIS — M9900 Segmental and somatic dysfunction of head region: Secondary | ICD-10-CM

## 2016-11-09 DIAGNOSIS — M9908 Segmental and somatic dysfunction of rib cage: Secondary | ICD-10-CM | POA: Diagnosis not present

## 2016-11-09 DIAGNOSIS — M9902 Segmental and somatic dysfunction of thoracic region: Secondary | ICD-10-CM | POA: Diagnosis not present

## 2016-11-09 DIAGNOSIS — M9909 Segmental and somatic dysfunction of abdomen and other regions: Secondary | ICD-10-CM | POA: Diagnosis not present

## 2016-11-09 DIAGNOSIS — M9904 Segmental and somatic dysfunction of sacral region: Secondary | ICD-10-CM
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
## 2016-11-09 NOTE — Telephone Encounter (Signed)
-----   Message from Jerene Pitch, [place] sent at 11/09/2016 11:32 AM EDT ----- Patient notified.  ----- Message ----- From: Valerie Roys, DO Sent: 11/09/2016  11:24 AM To: Tiffany L Reel, CMA  I figured. Can you please let her know.  ----- Message ----- From: Jerene Pitch, CMA Sent: 11/09/2016  11:20 AM To: Valerie Roys, DO  Insurance will not cover cleaning, that is personal care and would have to be paid out of pocket.  ----- Message ----- From: Valerie Roys, DO Sent: 11/09/2016  10:37 AM To: Tiffany L Reel, CMA  Wants home health for help with cleaning due to fatigue from fibromyalgia. Can we check to see if anyone does this and how much it would cost?
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
## 2016-12-05 ENCOUNTER — Encounter: Payer: Self-pay | Admitting: Family Medicine

## 2016-12-05 ENCOUNTER — Ambulatory Visit
Payer: Medicare Other | Attending: Student in an Organized Health Care Education/Training Program | Admitting: Student in an Organized Health Care Education/Training Program

## 2016-12-05 ENCOUNTER — Ambulatory Visit (INDEPENDENT_AMBULATORY_CARE_PROVIDER_SITE_OTHER): Payer: Medicare Other | Admitting: Family Medicine

## 2016-12-05 VITALS — BP 118/52 | HR 88 | Temp 98.8°F | Resp 14 | Ht 61.0 in | Wt 177.0 lb

## 2016-12-05 VITALS — BP 121/81 | HR 102 | Temp 98.5°F | Wt 177.2 lb

## 2016-12-05 DIAGNOSIS — E039 Hypothyroidism, unspecified: Secondary | ICD-10-CM | POA: Diagnosis not present

## 2016-12-05 DIAGNOSIS — M9903 Segmental and somatic dysfunction of lumbar region: Secondary | ICD-10-CM

## 2016-12-05 DIAGNOSIS — M791 Myalgia: Secondary | ICD-10-CM | POA: Diagnosis not present

## 2016-12-05 DIAGNOSIS — M9909 Segmental and somatic dysfunction of abdomen and other regions: Secondary | ICD-10-CM

## 2016-12-05 DIAGNOSIS — M47816 Spondylosis without myelopathy or radiculopathy, lumbar region: Secondary | ICD-10-CM

## 2016-12-05 DIAGNOSIS — M069 Rheumatoid arthritis, unspecified: Secondary | ICD-10-CM

## 2016-12-05 DIAGNOSIS — E559 Vitamin D deficiency, unspecified: Secondary | ICD-10-CM | POA: Insufficient documentation

## 2016-12-05 DIAGNOSIS — M26629 Arthralgia of temporomandibular joint, unspecified side: Secondary | ICD-10-CM

## 2016-12-05 DIAGNOSIS — M9902 Segmental and somatic dysfunction of thoracic region: Secondary | ICD-10-CM

## 2016-12-05 DIAGNOSIS — F819 Developmental disorder of scholastic skills, unspecified: Secondary | ICD-10-CM | POA: Insufficient documentation

## 2016-12-05 DIAGNOSIS — Z23 Encounter for immunization: Secondary | ICD-10-CM | POA: Diagnosis not present

## 2016-12-05 DIAGNOSIS — F339 Major depressive disorder, recurrent, unspecified: Secondary | ICD-10-CM

## 2016-12-05 DIAGNOSIS — R102 Pelvic and perineal pain: Secondary | ICD-10-CM | POA: Insufficient documentation

## 2016-12-05 DIAGNOSIS — G8929 Other chronic pain: Secondary | ICD-10-CM | POA: Diagnosis not present

## 2016-12-05 DIAGNOSIS — F419 Anxiety disorder, unspecified: Secondary | ICD-10-CM | POA: Insufficient documentation

## 2016-12-05 DIAGNOSIS — M7918 Myalgia, other site: Secondary | ICD-10-CM

## 2016-12-05 DIAGNOSIS — M9905 Segmental and somatic dysfunction of pelvic region: Secondary | ICD-10-CM | POA: Diagnosis not present

## 2016-12-05 DIAGNOSIS — Z79899 Other long term (current) drug therapy: Secondary | ICD-10-CM | POA: Diagnosis not present

## 2016-12-05 DIAGNOSIS — M9901 Segmental and somatic dysfunction of cervical region: Secondary | ICD-10-CM

## 2016-12-05 DIAGNOSIS — Z886 Allergy status to analgesic agent status: Secondary | ICD-10-CM | POA: Insufficient documentation

## 2016-12-05 DIAGNOSIS — G894 Chronic pain syndrome: Secondary | ICD-10-CM

## 2016-12-05 DIAGNOSIS — M9904 Segmental and somatic dysfunction of sacral region: Secondary | ICD-10-CM

## 2016-12-05 DIAGNOSIS — R5382 Chronic fatigue, unspecified: Secondary | ICD-10-CM | POA: Diagnosis not present

## 2016-12-05 DIAGNOSIS — G4730 Sleep apnea, unspecified: Secondary | ICD-10-CM | POA: Diagnosis not present

## 2016-12-05 DIAGNOSIS — M797 Fibromyalgia: Secondary | ICD-10-CM | POA: Diagnosis not present

## 2016-12-05 MED ORDER — TIZANIDINE HCL 4 MG PO TABS: 4.0000 mg | ORAL_TABLET | Freq: Two times a day (BID) | ORAL | 1 refills | Status: DC | PRN

## 2016-12-05 MED ORDER — OXYCODONE-ACETAMINOPHEN 5-325 MG PO TABS: 1.0000 | ORAL_TABLET | Freq: Four times a day (QID) | ORAL | 0 refills | Status: DC | PRN

## 2016-12-05 NOTE — Patient Instructions (Addendum)
1. Urine drug screen today. 2. Referral to pain psychology which is standard for all new patients. Please be sure to bring their evaluation with you.I cannot prescribe any opioid medications until you have seen them. 3. Right sided lumbar facet blocks with sedation, next available appointment 4. Tizanidine 4 mg twice daily as needed for muscle spasms. 5. Please stop your Xanax. You can try taking tizanidine in its place to help out with sleep. Can discuss Trazodone with PCP as well.GENERAL RISKS AND COMPLICATIONS  What are the risk, side effects and possible complications? Generally speaking, most procedures are safe.  However, with any procedure there are risks, side effects, and the possibility of complications.  The risks and complications are dependent upon the sites that are lesioned, or the type of nerve block to be performed.  The closer the procedure is to the spine, the more serious the risks are.  Great care is taken when placing the radio frequency needles, block needles or lesioning probes, but sometimes complications can occur. 1. Infection: Any time there is an injection through the skin, there is a risk of infection.  This is why sterile conditions are used for these blocks.  There are four possible types of infection. 1. Localized skin infection. 2. Central Nervous System Infection-This can be in the form of Meningitis, which can be deadly. 3. Epidural Infections-This can be in the form of an epidural abscess, which can cause pressure inside of the spine, causing compression of the spinal cord with subsequent paralysis. This would require an emergency surgery to decompress, and there are no guarantees that the patient would recover from the paralysis. 4. Discitis-This is an infection of the intervertebral discs.  It occurs in about 1% of discography procedures.  It is difficult to treat and it may lead to surgery.        2. Pain: the needles have to go through skin and soft tissues,  will cause soreness.       3. Damage to internal structures:  The nerves to be lesioned may be near blood vessels or    other nerves which can be potentially damaged.       4. Bleeding: Bleeding is more common if the patient is taking blood thinners such as  aspirin, Coumadin, Ticiid, Plavix, etc., or if he/she have some genetic predisposition  such as hemophilia. Bleeding into the spinal canal can cause compression of the spinal  cord with subsequent paralysis.  This would require an emergency surgery to  decompress and there are no guarantees that the patient would recover from the  paralysis.       5. Pneumothorax:  Puncturing of a lung is a possibility, every time a needle is introduced in  the area of the chest or upper back.  Pneumothorax refers to free air around the  collapsed lung(s), inside of the thoracic cavity (chest cavity).  Another two possible  complications related to a similar event would include: Hemothorax and Chylothorax.   These are variations of the Pneumothorax, where instead of air around the collapsed  lung(s), you may have blood or chyle, respectively.       6. Spinal headaches: They may occur with any procedures in the area of the spine.       7. Persistent CSF (Cerebro-Spinal Fluid) leakage: This is a rare problem, but may occur  with prolonged intrathecal or epidural catheters either due to the formation of a fistulous  track or a dural tear.  8. Nerve damage: By working so close to the spinal cord, there is always a possibility of  nerve damage, which could be as serious as a permanent spinal cord injury with  paralysis.       9. Death:  Although rare, severe deadly allergic reactions known as "Anaphylactic  reaction" can occur to any of the medications used.      10. Worsening of the symptoms:  We can always make thing worse.  What are the chances of something like this happening? Chances of any of this occuring are extremely low.  By statistics, you have more of a  chance of getting killed in a motor vehicle accident: while driving to the hospital than any of the above occurring .  Nevertheless, you should be aware that they are possibilities.  In general, it is similar to taking a shower.  Everybody knows that you can slip, hit your head and get killed.  Does that mean that you should not shower again?  Nevertheless always keep in mind that statistics do not mean anything if you happen to be on the wrong side of them.  Even if a procedure has a 1 (one) in a 1,000,000 (million) chance of going wrong, it you happen to be that one..Also, keep in mind that by statistics, you have more of a chance of having something go wrong when taking medications.  Who should not have this procedure? If you are on a blood thinning medication (e.g. Coumadin, Plavix, see list of "Blood Thinners"), or if you have an active infection going on, you should not have the procedure.  If you are taking any blood thinners, please inform your physician.  How should I prepare for this procedure?  Do not eat or drink anything at least six hours prior to the procedure.  Bring a driver with you .  It cannot be a taxi.  Come accompanied by an adult that can drive you back, and that is strong enough to help you if your legs get weak or numb from the local anesthetic.  Take all of your medicines the morning of the procedure with just enough water to swallow them.  If you have diabetes, make sure that you are scheduled to have your procedure done first thing in the morning, whenever possible.  If you have diabetes, take only half of your insulin dose and notify our nurse that you have done so as soon as you arrive at the clinic.  If you are diabetic, but only take blood sugar pills (oral hypoglycemic), then do not take them on the morning of your procedure.  You may take them after you have had the procedure.  Do not take aspirin or any aspirin-containing medications, at least eleven (11) days  prior to the procedure.  They may prolong bleeding.  Wear loose fitting clothing that may be easy to take off and that you would not mind if it got stained with Betadine or blood.  Do not wear any jewelry or perfume  Remove any nail coloring.  It will interfere with some of our monitoring equipment.  NOTE: Remember that this is not meant to be interpreted as a complete list of all possible complications.  Unforeseen problems may occur.  BLOOD THINNERS The following drugs contain aspirin or other products, which can cause increased bleeding during surgery and should not be taken for 2 weeks prior to and 1 week after surgery.  If you should need take something for relief of minor  pain, you may take acetaminophen which is found in Tylenol,m Datril, Anacin-3 and Panadol. It is not blood thinner. The products listed below are.  Do not take any of the products listed below in addition to any listed on your instruction sheet.  A.P.C or A.P.C with Codeine Codeine Phosphate Capsules #3 Ibuprofen Ridaura  ABC compound Congesprin Imuran rimadil  Advil Cope Indocin Robaxisal  Alka-Seltzer Effervescent Pain Reliever and Antacid Coricidin or Coricidin-D  Indomethacin Rufen  Alka-Seltzer plus Cold Medicine Cosprin Ketoprofen S-A-C Tablets  Anacin Analgesic Tablets or Capsules Coumadin Korlgesic Salflex  Anacin Extra Strength Analgesic tablets or capsules CP-2 Tablets Lanoril Salicylate  Anaprox Cuprimine Capsules Levenox Salocol  Anexsia-D Dalteparin Magan Salsalate  Anodynos Darvon compound Magnesium Salicylate Sine-off  Ansaid Dasin Capsules Magsal Sodium Salicylate  Anturane Depen Capsules Marnal Soma  APF Arthritis pain formula Dewitt's Pills Measurin Stanback  Argesic Dia-Gesic Meclofenamic Sulfinpyrazone  Arthritis Bayer Timed Release Aspirin Diclofenac Meclomen Sulindac  Arthritis pain formula Anacin Dicumarol Medipren Supac  Analgesic (Safety coated) Arthralgen Diffunasal Mefanamic Suprofen   Arthritis Strength Bufferin Dihydrocodeine Mepro Compound Suprol  Arthropan liquid Dopirydamole Methcarbomol with Aspirin Synalgos  ASA tablets/Enseals Disalcid Micrainin Tagament  Ascriptin Doan's Midol Talwin  Ascriptin A/D Dolene Mobidin Tanderil  Ascriptin Extra Strength Dolobid Moblgesic Ticlid  Ascriptin with Codeine Doloprin or Doloprin with Codeine Momentum Tolectin  Asperbuf Duoprin Mono-gesic Trendar  Aspergum Duradyne Motrin or Motrin IB Triminicin  Aspirin plain, buffered or enteric coated Durasal Myochrisine Trigesic  Aspirin Suppositories Easprin Nalfon Trillsate  Aspirin with Codeine Ecotrin Regular or Extra Strength Naprosyn Uracel  Atromid-S Efficin Naproxen Ursinus  Auranofin Capsules Elmiron Neocylate Vanquish  Axotal Emagrin Norgesic Verin  Azathioprine Empirin or Empirin with Codeine Normiflo Vitamin E  Azolid Emprazil Nuprin Voltaren  Bayer Aspirin plain, buffered or children's or timed BC Tablets or powders Encaprin Orgaran Warfarin Sodium  Buff-a-Comp Enoxaparin Orudis Zorpin  Buff-a-Comp with Codeine Equegesic Os-Cal-Gesic   Buffaprin Excedrin plain, buffered or Extra Strength Oxalid   Bufferin Arthritis Strength Feldene Oxphenbutazone   Bufferin plain or Extra Strength Feldene Capsules Oxycodone with Aspirin   Bufferin with Codeine Fenoprofen Fenoprofen Pabalate or Pabalate-SF   Buffets II Flogesic Panagesic   Buffinol plain or Extra Strength Florinal or Florinal with Codeine Panwarfarin   Buf-Tabs Flurbiprofen Penicillamine   Butalbital Compound Four-way cold tablets Penicillin   Butazolidin Fragmin Pepto-Bismol   Carbenicillin Geminisyn Percodan   Carna Arthritis Reliever Geopen Persantine   Carprofen Gold's salt Persistin   Chloramphenicol Goody's Phenylbutazone   Chloromycetin Haltrain Piroxlcam   Clmetidine heparin Plaquenil   Cllnoril Hyco-pap Ponstel   Clofibrate Hydroxy chloroquine Propoxyphen         Before stopping any of these medications,  be sure to consult the physician who ordered them.  Some, such as Coumadin (Warfarin) are ordered to prevent or treat serious conditions such as "deep thrombosis", "pumonary embolisms", and other heart problems.  The amount of time that you may need off of the medication may also vary with the medication and the reason for which you were taking it.  If you are taking any of these medications, please make sure you notify your pain physician before you undergo any procedures.          Facet Joint Block The facet joints connect the bones of the spine (vertebrae). They make it possible for you to bend, twist, and make other movements with your spine. They also keep you from bending too far, twisting too far, and making other  excessive movements. A facet joint block is a procedure where a numbing medicine (anesthetic) is injected into a facet joint. Often, a type of anti-inflammatory medicine called a steroid is also injected. A facet joint block may be done to diagnose neck or back pain. If the pain gets better after a facet joint block, it means the pain is probably coming from the facet joint. If the pain does not get better, it means the pain is probably not coming from the facet joint. A facet joint block may also be done to relieve neck or back pain caused by an inflamed facet joint. A facet joint block is only done to relieve pain if the pain does not improve with other methods, such as medicine, exercise programs, and physical therapy. Tell a health care provider about:  Any allergies you have.  All medicines you are taking, including vitamins, herbs, eye drops, creams, and over-the-counter medicines.  Any problems you or family members have had with anesthetic medicines.  Any blood disorders you have.  Any surgeries you have had.  Any medical conditions you have.  Whether you are pregnant or may be pregnant. What are the risks? Generally, this is a safe procedure. However, problems  may occur, including:  Bleeding.  Injury to a nerve near the injection site.  Pain at the injection site.  Weakness or numbness in areas controlled by nerves near the injection site.  Infection.  Temporary fluid retention.  Allergic reactions to medicines or dyes.  Injury to other structures or organs near the injection site.  What happens before the procedure?  Follow instructions from your health care provider about eating or drinking restrictions.  Ask your health care provider about: ? Changing or stopping your regular medicines. This is especially important if you are taking diabetes medicines or blood thinners. ? Taking medicines such as aspirin and ibuprofen. These medicines can thin your blood. Do not take these medicines before your procedure if your health care provider instructs you not to.  Do not take any new dietary supplements or medicines without asking your health care provider first.  Plan to have someone take you home after the procedure. What happens during the procedure?  You may need to remove your clothing and dress in an open-back gown.  The procedure will be done while you are lying on an X-ray table. You will most likely be asked to lie on your stomach, but you may be asked to lie in a different position if an injection will be made in your neck.  Machines will be used to monitor your oxygen levels, heart rate, and blood pressure.  If an injection will be made in your neck, an IV tube will be inserted into one of your veins. Fluids and medicine will flow directly into your body through the IV tube.  The area over the facet joint where the injection will be made will be cleaned with soap. The surrounding skin will be covered with clean drapes.  A numbing medicine (local anesthetic) will be applied to your skin. Your skin may sting or burn for a moment.  A video X-ray machine (fluoroscopy) will be used to locate the joint. In some cases, a CT scan  may be used.  A contrast dye may be injected into the facet joint area to help locate the joint.  When the joint is located, an anesthetic will be injected into the joint through the needle.  Your health care provider will ask you whether you  feel pain relief. If you do feel relief, a steroid may be injected to provide pain relief for a longer period of time. If you do not feel relief or feel only partial relief, additional injections of an anesthetic may be made in other facet joints.  The needle will be removed.  Your skin will be cleaned.  A bandage (dressing) will be applied over each injection site. The procedure may vary among health care providers and hospitals. What happens after the procedure?  You will be observed for 15-30 minutes before being allowed to go home. This information is not intended to replace advice given to you by your health care provider. Make sure you discuss any questions you have with your health care provider. Document Released: 07/19/2006 Document Revised: 03/31/2015 Document Reviewed: 11/23/2014 [employer]vier Interactive Patient Education  [employer].

## 2016-12-05 NOTE — Progress Notes (Signed)
Safety precautions to be maintained throughout the outpatient stay will include: orient to surroundings, keep bed in low position, maintain call bell within reach at all times, provide assistance with transfer out of bed and ambulation.  

## 2016-12-05 NOTE — Patient Instructions (Signed)

## 2016-12-05 NOTE — Progress Notes (Signed)
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 temperature, and hair growth are WNL. No peripheral edema or cyanosis. No masses, redness, swelling, asymmetry, or associated skin lesions. No contractures.  Functional ROM: Unrestricted ROM          Functional ROM: Unrestricted ROM          Muscle Tone/Strength: Functionally intact. No obvious neuro-muscular anomalies detected.  Muscle Tone/Strength: Functionally intact. No obvious neuro-muscular anomalies  detected.  Sensory (Neurological): Unimpaired  Sensory (Neurological): Unimpaired  Palpation: No palpable anomalies  Palpation: No palpable anomalies   Assessment  Primary Diagnosis & Pertinent Problem List: The primary encounter diagnosis was Spondylosis without myelopathy or radiculopathy, lumbar region. Diagnoses of Rheumatoid arthritis, involving unspecified site, unspecified rheumatoid factor presence ([place]), TMJ pain dysfunction syndrome, Recurrent major depressive disorder, remission status unspecified ([place]),  Fibromyalgia, Chronic fatigue, Myofascial pain, and Chronic pain syndrome were also pertinent to this visit.  Visit Diagnosis (New problems to examiner): 1. Spondylosis without myelopathy or radiculopathy, lumbar region   2. Rheumatoid arthritis, involving unspecified site, unspecified rheumatoid factor presence ([place])   3. TMJ pain dysfunction syndrome   4. Recurrent major depressive disorder, remission status unspecified ([place])   5. Fibromyalgia   6. Chronic fatigue   7. Myofascial pain   8. Chronic pain syndrome    Plan of Care (Initial workup plan)  Note: Please be advised that as per protocol, today's visit has been an evaluation only. We have not taken over the patient's controlled substance management.  General Recommendations: The pain condition that the patient suffers from is best treated with a multidisciplinary approach that involves an increase in physical activity to prevent de-conditioning and worsening of the pain cycle, as well as psychological counseling (formal and/or informal) to address the co-morbid psychological affects of pain. Treatment will often involve judicious use of pain medications and interventional procedures to decrease the pain, allowing the patient to participate in the physical activity that will ultimately produce long-lasting pain reductions. The goal of the multidisciplinary approach is to return the patient to a higher level of overall function and to restore their ability to perform activities of daily living.  36 year old female with a history of ADD, rheumatoid arthritis, fibromyalgia, chronic fatigue syndrome, depression who presents with diffuse whole body pain most pronounced in her joints including shoulders wrists, knee, hip, and axial low back pain with radiation to her right side. Patient also has a history of pelvic pain. She has been working with physical therapy which she states has been helping for her pelvic pain. Patient was previously seen by  [facility] rheumatology for many years who managed her opioid therapy which includes oxycodone 5 mg 1-2 tablets every 6 hours as needed for severe pain, quantity 150 month. Patient is also taking Xanax and Adderall.Her rheumatologist did not feel comfortable continuing her opioid therapy in the context of Xanax and wanted the patient to follow-up with her pain provider. Patient states that she takes Xanax half a milligram years at night for sleep. She has weaned herself down over the last couple of months. She states that it is difficult for her to wean herself off of half a milligram tablet at night. She currently sees a psychiatrist.  In regards to her low back and buttock pain, this could be secondary to lumbar spondylosis, facet arthropathy which is evidenced on her lumbar MRI affecting the right L5-S1 facet joint with some involvement of the right L3-L4 and L4-L5 facet joint as well. She does have pain with lumbar extension and lateral rotation worse on the right. For her lumbar spondylosis, we discussed lumbar medial branch blocks on the right side at L3/L4, L4/L5, L5/S1 with therapeutic radiofrequency ablation if effective.  In regards to her pelvic pain, patient states that she will get another pelvic MRI as ordered by her primary care physician. I will review this at my next visit with the patient. For the patient's pelvic pain, we discussed sympathetic  nerve blocks including ganglion of impar block to help out with her pelvic pain. We can try this if the lumbar medial branch blocks are not effective for her low back and buttock pain.  In regards to medication management, I had an extensive discussion, roughly over 30 minutes, specifically talking about my concerns of recurrent medication regimen. I told her that she would not be an opioid candidate at our clinic if she was on benzodiazepine therapy. Patient states that she has been using half a milligram of Xanax for sleep and I encouraged her to use  alternative modalities for sleep aid. Furthermore I explained the concept of opioid-induced hyperalgesia along with hormonal and immune side effects that can happen on chronic opioid therapy. In addition I explained that opioid medications are detrimental for management of fibromyalgia however patient states that the opioid medications were prescribed initially for her rheumatoid arthritis and that they significantly help her function.Patient must be off of any benzodiazepine medications which will be confirmed by urine drug screen before she is considered for opioid therapy.i will also send the patient to pain psychology regarding SUD assessment.  Plan: 1. Urine drug screen today. Should be positive for amphetamine, oxycodone and its metabolites, Xanax. 2. Referral to pain psychology regarding risk of SUD. Patient to return for opioid related medication management only after she has seen pain psychology. 3. Discontinue/weaning off Xanax with the help of her psychiatrist. Patient must discontinue all benzodiazepines if she is to be considered for opioid therapy at our clinic. 4. We'll follow-up pelvic MRI that is ordered by primary care physician 5. Tizanidine 4 mg twice a day when necessary muscle spasms and to help with sleep. Patient can use this in place of Xanax 0.5 mg daily at bedtime which she is takes for sleep. 6. Right L3/L4, L4/L5, L5/S1 facet medial branch nerve block with goal radiofrequency ablation for right lumbar spondylosis and facet arthropathy.  Ordered Lab-work, Procedure(s), Referral(s), & Consult(s): Orders Placed This Encounter  Procedures  . LUMBAR FACET(MEDIAL BRANCH NERVE BLOCK) MBNB  . Compliance Drug Analysis, Ur  . Ambulatory referral to Psychology   Pharmacotherapy (current): Medications ordered:  Meds ordered this encounter  Medications  . tiZANidine (ZANAFLEX) 4 MG tablet    Sig: Take 1 tablet (4 mg total) by mouth 2 (two) times daily as needed for muscle  spasms.    Dispense:  60 tablet    Refill:  1   Medications administered during this visit: Ms. Hanigan had no medications administered during this visit.   Pharmacological management options:  Opioid Analgesics: The patient was informed that there is no guarantee that she would be a candidate for opioid analgesics. The decision will be made following CDC guidelines. This decision will be based on the results of diagnostic studies, as well as Ms. Stotts's risk profile.   Membrane stabilizer: gabapentin in the remote past which was not effective, Cymbalta which resulted in mood changes, Lyrica which made her feel ill   Muscle relaxant: Flexeril in the past which made her sluggish. We will trial tizanidine today. Consider baclofen or Robaxin in the future.  NSAID: To be determined at a later time  Other analgesic(s): To be determined at a later time consider disease modifying antirheumatic drugs.   Interventional management options: Ms. Filsaime was informed that there is no guarantee that she would be a candidate for interventional therapies. The decision will be based on the results of diagnostic studies, as well as Ms. Mcgaughey's risk profile.  Procedure(s) under consideration:  -right lumbar facet block, L3-L5 next line -ganglion of impar block for pelvic pain (sympathetic block) -hip interventions including SI joint injection versus intra-articular hip injection versus GTB injection   Future imaging: -Follow up pelvic MRI -and low back pain not better after diagnostic lumbar facets, consider x-ray of SI joint.  Provider-requested follow-up: No Follow-up on file.  Future Appointments Date Time Provider [place]  12/11/2016 10:00 AM Gillis Santa, MD [facility] None  01/02/2017 1:00 PM Valerie Roys, DO [facility] None    Primary Care Physician: Valerie Roys, DO Location: [facility] Note by: Gillis Santa, M.D, Date: 12/05/2016; Time: 1:38  PM  Patient Instructions   1. Urine drug screen today. 2. Referral to pain psychology which is standard for all new patients. Please be sure to bring their evaluation with you.I cannot prescribe any opioid medications until you have seen them. 3. Right sided lumbar facet blocks with sedation, next available appointment 4. Tizanidine 4 mg twice daily as needed for muscle spasms. 5. Please stop your Xanax. You can try taking tizanidine in its place to help out with sleep. Can discuss Trazodone with PCP as well.GENERAL RISKS AND COMPLICATIONS  What are the risk, side effects and possible complications? Generally speaking, most procedures are safe.  However, with any procedure there are risks, side effects, and the possibility of complications.  The risks and complications are dependent upon the sites that are lesioned, or the type of nerve block to be performed.  The closer the procedure is to the spine, the more serious the risks are.  Great care is taken when placing the radio frequency needles, block needles or lesioning probes, but sometimes complications can occur. 1. Infection: Any time there is an injection through the skin, there is a risk of infection.  This is why sterile conditions are used for these blocks.  There are four possible types of infection. 1. Localized skin infection. 2. Central Nervous System Infection-This can be in the form of Meningitis, which can be deadly. 3. Epidural Infections-This can be in the form of an epidural abscess, which can cause pressure inside of the spine, causing compression of the spinal cord with subsequent paralysis. This would require an emergency surgery to decompress, and there are no guarantees that the patient would recover from the paralysis. 4. Discitis-This is an infection of the intervertebral discs.  It occurs in about 1% of discography procedures.  It is difficult to treat and it may lead to surgery.        2. Pain: the needles have to go  through skin and soft tissues, will cause soreness.       3. Damage to internal structures:  The nerves to be lesioned may be near blood vessels or    other nerves which can be potentially damaged.       4. Bleeding: Bleeding is more common if the patient is taking blood thinners such as  aspirin, Coumadin, Ticiid, Plavix, etc., or if he/she have some genetic predisposition  such as hemophilia. Bleeding into the spinal canal can cause compression of the spinal  cord with subsequent paralysis.  This would require an emergency surgery to  decompress and there are no guarantees that the patient would recover from the  paralysis.       5. Pneumothorax:  Puncturing of a lung is a possibility, every time a needle is introduced in  the area of the chest or upper back.  Pneumothorax refers to free air around the  collapsed lung(s), inside of the thoracic cavity (chest cavity).  Another two possible  complications related to a similar event would include: Hemothorax and Chylothorax.   These are variations of the Pneumothorax, where instead of air around the collapsed  lung(s), you may have blood or chyle, respectively.       6. Spinal headaches: They may occur with any procedures in the area of the spine.       7. Persistent CSF (Cerebro-Spinal Fluid) leakage: This is a rare problem, but may occur  with prolonged intrathecal or epidural catheters either due to the formation of a fistulous  track or a dural tear.       8. Nerve damage: By working so close to the spinal cord, there is always a possibility of  nerve damage, which could be as serious as a permanent spinal cord injury with  paralysis.       9. Death:  Although rare, severe deadly allergic reactions known as "Anaphylactic  reaction" can occur to any of the medications used.      10. Worsening of the symptoms:  We can always make thing worse.  What are the chances of something like this happening? Chances of any of this occuring are extremely low.  By  statistics, you have more of a chance of getting killed in a motor vehicle accident: while driving to the hospital than any of the above occurring .  Nevertheless, you should be aware that they are possibilities.  In general, it is similar to taking a shower.  Everybody knows that you can slip, hit your head and get killed.  Does that mean that you should not shower again?  Nevertheless always keep in mind that statistics do not mean anything if you happen to be on the wrong side of them.  Even if a procedure has a 1 (one) in a 1,000,000 (million) chance of going wrong, it you happen to be that one..Also, keep in mind that by statistics, you have more of a chance of having something go wrong when taking medications.  Who should not have this procedure? If you are on a blood thinning medication (e.g. Coumadin, Plavix, see list of "Blood Thinners"), or if you have an active infection going on, you should not have the procedure.  If you are taking any blood thinners, please inform your physician.  How should I prepare for this procedure?  Do not eat or drink anything at least six hours prior to the procedure.  Bring a driver with you .  It cannot be a taxi.  Come accompanied by an adult that can drive you back, and that is strong enough to help you if your legs get weak or numb from the local anesthetic.  Take all of your medicines the morning of the procedure with just enough water to swallow them.  If you have diabetes, make sure that you are scheduled to have your procedure done first thing in the morning, whenever possible.  If you have diabetes, take only half of your insulin dose and notify our nurse that you have done so as soon as you arrive at the clinic.  If you are diabetic, but only take blood sugar pills (oral hypoglycemic), then do not take them on the morning of your procedure.  You may take them after you have had the procedure.  Do not take aspirin or any aspirin-containing  medications, at least eleven (11) days prior  to the procedure.  They may prolong bleeding.  Wear loose fitting clothing that may be easy to take off and that you would not mind if it got stained with Betadine or blood.  Do not wear any jewelry or perfume  Remove any nail coloring.  It will interfere with some of our monitoring equipment.  NOTE: Remember that this is not meant to be interpreted as a complete list of all possible complications.  Unforeseen problems may occur.  BLOOD THINNERS The following drugs contain aspirin or other products, which can cause increased bleeding during surgery and should not be taken for 2 weeks prior to and 1 week after surgery.  If you should need take something for relief of minor pain, you may take acetaminophen which is found in Tylenol,m Datril, Anacin-3 and Panadol. It is not blood thinner. The products listed below are.  Do not take any of the products listed below in addition to any listed on your instruction sheet.  A.P.C or A.P.C with Codeine Codeine Phosphate Capsules #3 Ibuprofen Ridaura  ABC compound Congesprin Imuran rimadil  Advil Cope Indocin Robaxisal  Alka-Seltzer Effervescent Pain Reliever and Antacid Coricidin or Coricidin-D  Indomethacin Rufen  Alka-Seltzer plus Cold Medicine Cosprin Ketoprofen S-A-C Tablets  Anacin Analgesic Tablets or Capsules Coumadin Korlgesic Salflex  Anacin Extra Strength Analgesic tablets or capsules CP-2 Tablets Lanoril Salicylate  Anaprox Cuprimine Capsules Levenox Salocol  Anexsia-D Dalteparin Magan Salsalate  Anodynos Darvon compound Magnesium Salicylate Sine-off  Ansaid Dasin Capsules Magsal Sodium Salicylate  Anturane Depen Capsules Marnal Soma  APF Arthritis pain formula Dewitt's Pills Measurin Stanback  Argesic Dia-Gesic Meclofenamic Sulfinpyrazone  Arthritis Bayer Timed Release Aspirin Diclofenac Meclomen Sulindac  Arthritis pain formula Anacin Dicumarol Medipren Supac  Analgesic (Safety coated)  Arthralgen Diffunasal Mefanamic Suprofen  Arthritis Strength Bufferin Dihydrocodeine Mepro Compound Suprol  Arthropan liquid Dopirydamole Methcarbomol with Aspirin Synalgos  ASA tablets/Enseals Disalcid Micrainin Tagament  Ascriptin Doan's Midol Talwin  Ascriptin A/D Dolene Mobidin Tanderil  Ascriptin Extra Strength Dolobid Moblgesic Ticlid  Ascriptin with Codeine Doloprin or Doloprin with Codeine Momentum Tolectin  Asperbuf Duoprin Mono-gesic Trendar  Aspergum Duradyne Motrin or Motrin IB Triminicin  Aspirin plain, buffered or enteric coated Durasal Myochrisine Trigesic  Aspirin Suppositories Easprin Nalfon Trillsate  Aspirin with Codeine Ecotrin Regular or Extra Strength Naprosyn Uracel  Atromid-S Efficin Naproxen Ursinus  Auranofin Capsules Elmiron Neocylate Vanquish  Axotal Emagrin Norgesic Verin  Azathioprine Empirin or Empirin with Codeine Normiflo Vitamin E  Azolid Emprazil Nuprin Voltaren  Bayer Aspirin plain, buffered or children's or timed BC Tablets or powders Encaprin Orgaran Warfarin Sodium  Buff-a-Comp Enoxaparin Orudis Zorpin  Buff-a-Comp with Codeine Equegesic Os-Cal-Gesic   Buffaprin Excedrin plain, buffered or Extra Strength Oxalid   Bufferin Arthritis Strength Feldene Oxphenbutazone   Bufferin plain or Extra Strength Feldene Capsules Oxycodone with Aspirin   Bufferin with Codeine Fenoprofen Fenoprofen Pabalate or Pabalate-SF   Buffets II Flogesic Panagesic   Buffinol plain or Extra Strength Florinal or Florinal with Codeine Panwarfarin   Buf-Tabs Flurbiprofen Penicillamine   Butalbital Compound Four-way cold tablets Penicillin   Butazolidin Fragmin Pepto-Bismol   Carbenicillin Geminisyn Percodan   Carna Arthritis Reliever Geopen Persantine   Carprofen Gold's salt Persistin   Chloramphenicol Goody's Phenylbutazone   Chloromycetin Haltrain Piroxlcam   Clmetidine heparin Plaquenil   Cllnoril Hyco-pap Ponstel   Clofibrate Hydroxy chloroquine Propoxyphen          Before stopping any of these medications, be sure to consult the physician who ordered them.  Some, such as Coumadin (Warfarin) are ordered to prevent or treat serious conditions such as "deep thrombosis", "pumonary embolisms", and other heart problems.  The amount of time that you may need off of the medication may also vary with the medication and the reason for which you were taking it.  If you are taking any of these medications, please make sure you notify your pain physician before you undergo any procedures.          Facet Joint Block The facet joints connect the bones of the spine (vertebrae). They make it possible for you to bend, twist, and make other movements with your spine. They also keep you from bending too far, twisting too far, and making other excessive movements. A facet joint block is a procedure where a numbing medicine (anesthetic) is injected into a facet joint. Often, a type of anti-inflammatory medicine called a steroid is also injected. A facet joint block may be done to diagnose neck or back pain. If the pain gets better after a facet joint block, it means the pain is probably coming from the facet joint. If the pain does not get better, it means the pain is probably not coming from the facet joint. A facet joint block may also be done to relieve neck or back pain caused by an inflamed facet joint. A facet joint block is only done to relieve pain if the pain does not improve with other methods, such as medicine, exercise programs, and physical therapy. Tell a health care provider about:  Any allergies you have.  All medicines you are taking, including vitamins, herbs, eye drops, creams, and over-the-counter medicines.  Any problems you or family members have had with anesthetic medicines.  Any blood disorders you have.  Any surgeries you have had.  Any medical conditions you have.  Whether you are pregnant or may be pregnant. What are the risks? Generally,  this is a safe procedure. However, problems may occur, including:  Bleeding.  Injury to a nerve near the injection site.  Pain at the injection site.  Weakness or numbness in areas controlled by nerves near the injection site.  Infection.  Temporary fluid retention.  Allergic reactions to medicines or dyes.  Injury to other structures or organs near the injection site.  What happens before the procedure?  Follow instructions from your health care provider about eating or drinking restrictions.  Ask your health care provider about: ? Changing or stopping your regular medicines. This is especially important if you are taking diabetes medicines or blood thinners. ? Taking medicines such as aspirin and ibuprofen. These medicines can thin your blood. Do not take these medicines before your procedure if your health care provider instructs you not to.  Do not take any new dietary supplements or medicines without asking your health care provider first.  Plan to have someone take you home after the procedure. What happens during the procedure?  You may need to remove your clothing and dress in an open-back gown.  The procedure will be done while you are lying on an X-ray table. You will most likely be asked to lie on your stomach, but you may be asked to lie in a different position if an injection will be made in your neck.  Machines will be used to monitor your oxygen levels, heart rate, and blood pressure.  If an injection will be made in your neck, an IV tube will be inserted into one of your veins. Fluids and medicine  will flow directly into your body through the IV tube.  The area over the facet joint where the injection will be made will be cleaned with soap. The surrounding skin will be covered with clean drapes.  A numbing medicine (local anesthetic) will be applied to your skin. Your skin may sting or burn for a moment.  A video X-ray machine (fluoroscopy) will be used to  locate the joint. In some cases, a CT scan may be used.  A contrast dye may be injected into the facet joint area to help locate the joint.  When the joint is located, an anesthetic will be injected into the joint through the needle.  Your health care provider will ask you whether you feel pain relief. If you do feel relief, a steroid may be injected to provide pain relief for a longer period of time. If you do not feel relief or feel only partial relief, additional injections of an anesthetic may be made in other facet joints.  The needle will be removed.  Your skin will be cleaned.  A bandage (dressing) will be applied over each injection site. The procedure may vary among health care providers and hospitals. What happens after the procedure?  You will be observed for 15-30 minutes before being allowed to go home. This information is not intended to replace advice given to you by your health care provider. Make sure you discuss any questions you have with your health care provider. Document Released: 07/19/2006 Document Revised: 03/31/2015 Document Reviewed: 11/23/2014 Elsevier Interactive Patient Education  [employer].

## 2016-12-06 ENCOUNTER — Telehealth: Payer: Self-pay

## 2016-12-06 NOTE — Assessment & Plan Note (Signed)
We have taken over her chronic pain management from rheumatology due to issues with transportation. She is aware that we do not do long term pain management but will bridge her until she gets into see pain management. Would like to see pain management at [facility] to help with transportation issues. Referral generated last visit. She has an  ppointment with Dr. Holley Raring at [facility] pain clinic scheduled for today at 11 AM.  We know that they will not start prescribing medication for the first few months. We will continue to bridge her chronic pain medicine until she sees them and they determine if they will take over or not. PMP reviewed today and appropriate. Refill of her medicine given today. Follow up 1 month.

## 2016-12-06 NOTE — Telephone Encounter (Signed)
Called patient to notify her of her MRI.  No answer, LVM to call back.  Appointment is 12/13/2016 arrive at 1:45pm NPO 8 hours prior.

## 2016-12-06 NOTE — Progress Notes (Signed)
BP 121/81 (BP Location: Left Arm, Patient Position: Sitting, Cuff Size: Normal)   Pulse (!) 102   Temp 98.5 F (36.9 C)   Wt 177 lb 4 oz (80.4 kg)   LMP 11/06/2016   SpO2 98%   BMI 32.16 kg/m    Subjective:    Patient ID: Sharon Rodriguez, female    DOB: 1980/03/19, 36 y.o.   MRN: [id number]  HPI: Sharon Rodriguez is a 35 y.o. female  Chief Complaint  Patient presents with  . Pain   CHRONIC PAIN  Present dose: 60 Morphine equivalents Pain control status: stable Duration: chronic Location: wide spread Quality: aching and tight and throbbing Current Pain Level: mild Previous Pain Level: severe Breakthrough pain:yes- in her pelvis Benefit from narcotic medications:yes What Activities task can be accomplished with current medication?- able to take care of her children Interested in weaning off narcotics:no Stool softners/OTC fiber: no Previous pain specialty evaluation: yes Non-narcotic analgesic meds: no Narcotic contract: yes  Notes that as of yesterday she couldn't move, couldn't work. Her hips and pelvis felt tight and irritated. She notes that when she does walk, she limps. Pain is tight and sharp near her pubic bone. Better with sitting, worse with moving around. She notes that OMT seems to help as it keeps her loser. She did well following her last appointment and has been stable on her current pain managment regimen. She sees pain management today. Pain radiates into her back and down into her legs. She feels weak as well. She is otherwise doing well with no other concerns or complaints at this time.   Relevant past medical, surgical, family and social history reviewed and updated as indicated. Interim medical history since our last visit reviewed. Allergies and medications reviewed and updated.  Review of Systems  Respiratory: Negative.   Cardiovascular: Negative.   Musculoskeletal: Positive for arthralgias, back pain, gait problem, joint swelling and  myalgias. Negative for neck pain and neck stiffness.  Neurological: Positive for weakness and numbness. Negative for dizziness, tremors, seizures, syncope, facial asymmetry, speech difficulty, light-headedness and headaches.  Psychiatric/Behavioral: Negative.     Per HPI unless specifically indicated above     Objective:    BP 121/81 (BP Location: Left Arm, Patient Position: Sitting, Cuff Size: Normal)   Pulse (!) 102   Temp 98.5 F (36.9 C)   Wt 177 lb 4 oz (80.4 kg)   LMP 11/06/2016   SpO2 98%   BMI 32.16 kg/m   Wt Readings from Last 3 Encounters:  12/05/16 177 lb (80.3 kg)  12/05/16 177 lb 4 oz (80.4 kg)  11/09/16 173 lb 8 oz (78.7 kg)    Physical Exam  Constitutional: She is oriented to person, place, and time. She appears well-developed and well-nourished. No distress.  HENT:  Head: Normocephalic and atraumatic.  Right Ear: Hearing normal.  Left Ear: Hearing normal.  Nose: Nose normal.  Eyes: Conjunctivae and lids are normal. Right eye exhibits no discharge. Left eye exhibits no discharge. No scleral icterus.  Pulmonary/Chest: Effort normal. No respiratory distress.  Abdominal: Soft. She exhibits no distension and no mass. There is tenderness. There is no rebound and no guarding.  Neurological: She is alert and oriented to person, place, and time.  Skin: Skin is warm, dry and intact. No rash noted. No erythema. No pallor.  Psychiatric: She has a normal mood and affect. Her speech is normal and behavior is normal. Judgment and thought content normal. Cognition and memory are normal.  Nursing note and vitals reviewed. Musculoskeletal:  Exam found Decreased ROM, Tissue texture changes, Tenderness to palpation and Asymmetry of patient's  neck, thorax, lumbar, pelvis, sacrum and abdomen Osteopathic Structural Exam:   Neck: trap spasm on the R, C3ESRR, C4ESRL, SCM spasm on the R  Thorax: periscapular hypertonicity bilaterally R>L  Lumbar: QL hypertonic on the R, psoas spasm  on the R, L4-5SLRR  Pelvis: Posterior R innominate, SI joint restricted on the R  Sacrum: L on R sacral torsion  Abdomen: diaphragm spasm bilaterally R>L, pelvic diaphragm congested and pulled into R hip  Results for orders placed or performed in visit on 10/03/16  VITAMIN D 25 Hydroxy (Vit-D Deficiency, Fractures)  Result Value Ref Range   Vit D, 25-Hydroxy 18.4 (L) 30.0 - 100.0 ng/mL      Assessment & Plan:   Problem List Items Addressed This Visit      Other   Chronic pain    We have taken over her chronic pain management from rheumatology due to issues with transportation. She is aware that we do not do long term pain management but will bridge her until she gets into see pain management. Would like to see pain management at [facility] to help with transportation issues. Referral generated last visit. She has an  ppointment with Dr. Holley Raring at [facility] pain clinic scheduled for today at 11 AM.  We know that they will not start prescribing medication for the first few months. We will continue to bridge her chronic pain medicine until she sees them and they determine if they will take over or not. PMP reviewed today and appropriate. Refill of her medicine given today. Follow up 1 month.       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  Also seems to be due to weak core. Discussed need to strengthen this, and she states that she cannot. Did better with PT, but has finished that now and has not continued with her exercises. Will get her back into PT- referral generated today. Points to her pubic symphysis as the primary location of her pain. States that she limps when she walks. 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  Asymmetric facet arthropathy of L4-L5, MRI of lumbar was last done in 2009- but going to be repeated by pain management. Given her point tenderness of her pubic symphysis, will obtain pelvic MRI- ordered today. Await results.  She does have some somatic dysfunctions that I think are contributing to her symptoms. I think she would benefit from OMT. Patient treated today with fair results. Continue to monitor and recheck in as needed.      Relevant Medications   oxyCODONE-acetaminophen (PERCOCET) 5-325 MG tablet   Other Relevant Orders   MR Pelvis W Wo Contrast   Ambulatory referral to Physical Therapy    Other Visit Diagnoses    Immunization due       Flu shot given today.   Relevant Orders   Flu Vaccine QUAD 6+ mos PF IM (Fluarix Quad PF) (Completed)   Sacral region somatic dysfunction       Pelvic region somatic dysfunction       Thoracic region somatic dysfunction  Somatic dysfunction of spine, lumbar       Segmental dysfunction of abdomen       Somatic dysfunction of cervical region         After verbal consent was obtained, patient was treated today with osteopathic manipulative medicine to the regions of the neck, thorax, lumbar, pelvis, sacrum and abdomen using the techniques of myofascial release, counterstrain, muscle energy and soft tissue. Areas of compensation relating to her primary pain source also treated. Patient tolerated the procedure well with fair objective and fair subjective improvement in symptoms. She left the room in good condition. She was advised to stay well hydrated and that she may have some soreness following the procedure. If not improving or worsening, she will call and come in.  She will return for reevaluation  in 1-2 months.   Follow up plan: Return in about 4 weeks (around 01/02/2017) for follow up pain.

## 2016-12-06 NOTE — Assessment & Plan Note (Signed)
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  Also seems to be due to weak core. Discussed need to strengthen this, and she states that she cannot. Did better with PT, but has finished that now and has not continued with her exercises. Will get her back into PT- referral generated today. Points to her pubic symphysis as the primary location of her pain. States that she limps when she walks. 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 Asymmetric facet arthropathy of L4-L5, MRI of lumbar was last done in 2009- but going to be repeated by pain management. Given her point tenderness of her pubic symphysis, will obtain pelvic MRI- ordered today. Await results.  She does have some somatic dysfunctions that I think are contributing to her symptoms. I think she would benefit from OMT. Patient treated today with fair results. Continue to monitor and recheck in as needed.

## 2016-12-09 LAB — COMPLIANCE DRUG ANALYSIS, UR

## 2016-12-11 ENCOUNTER — Ambulatory Visit: Payer: Medicare Other | Admitting: Student in an Organized Health Care Education/Training Program

## 2016-12-12 NOTE — Telephone Encounter (Signed)
Routing to provider  

## 2016-12-12 NOTE — Telephone Encounter (Signed)
Patient was taken off of medication Xanax and having issues getting off of medication. Would like to have a substitute medication for anxiety and sleep. Patient uses [facility] [place].   Please Advise.  Thank you

## 2016-12-12 NOTE — Telephone Encounter (Signed)
Patient scheduled for 10:45am on 12/13/16.

## 2016-12-12 NOTE — Telephone Encounter (Signed)
Patient sees a psychiatrist who was prescribing that medication. She needs to follow up with them. Otherwise she will need an appointment to discuss this.

## 2016-12-13 ENCOUNTER — Encounter: Payer: Self-pay | Admitting: Family Medicine

## 2016-12-13 ENCOUNTER — Ambulatory Visit: Payer: Medicare Other

## 2016-12-13 ENCOUNTER — Ambulatory Visit (INDEPENDENT_AMBULATORY_CARE_PROVIDER_SITE_OTHER): Payer: Medicare Other | Admitting: Family Medicine

## 2016-12-13 VITALS — BP 133/73 | HR 107 | Temp 98.8°F | Wt 175.5 lb

## 2016-12-13 DIAGNOSIS — F419 Anxiety disorder, unspecified: Secondary | ICD-10-CM

## 2016-12-13 DIAGNOSIS — G8929 Other chronic pain: Secondary | ICD-10-CM | POA: Diagnosis not present

## 2016-12-13 DIAGNOSIS — R102 Pelvic and perineal pain: Secondary | ICD-10-CM

## 2016-12-13 NOTE — Progress Notes (Signed)
BP 133/73 (BP Location: Left Arm, Patient Position: Sitting, Cuff Size: Normal)   Pulse (!) 107   Temp 98.8 F (37.1 C)   Wt 175 lb 8 oz (79.6 kg)   SpO2 100%   BMI 33.16 kg/m    Subjective:    Patient ID: Sharon Rodriguez, female    DOB: 04/21/1980, 36 y.o.   MRN: [id number]  HPI: Sharon Rodriguez is a 36 y.o. female  Chief Complaint  Patient presents with  . Insomnia  . Anxiety   Sharon Rodriguez presents today asking for [place] to change her medication management for her anxiety. Her anxiety is chronic and is treated by her psychiatrist. We do not have any notes regarding her anxiety at this time. She does not remember what medications she has tried in the past. We do not have a definitive diagnosis on her. She is currently just taking lithium, adderall and xanax. She went to see pain management and they informed her that they would not write any opiates for her if she is on xanax. She is currently taking 1/2mg  QHS nightly. She would like to come off the medicine in order to fully establish with pain management. She stopped taking the xanax several days ago and notes that she can't sleep, is feeling jittery and is not herself. She would like [place] to give her something else to treat her anxiety. Both her anxiety and depression are quite poorly controlled at this time. She also notes that her back continues to hurt. Her pain medicine is continuing to take the edge off. She cancelled her MRI of her pelvis to come here today. She is upset and frustrated today. No other concerns.   Depression screen [facility] 2/9 12/15/2016 12/05/2016 10/03/2016 03/20/2016 01/27/2015  Decreased Interest 3 0 3 3 2   Down, Depressed, Hopeless 2 0 1 2 2   PHQ - 2 Score 5 0 4 5 4   Altered sleeping 3 - 2 3 3   Tired, decreased energy 3 - 3 3 3   Change in appetite 2 - 2 2 1   Feeling bad or failure about yourself  2 - 1 2 2   Trouble concentrating 2 - 3 2 2   Moving slowly or fidgety/restless 3 - 3 2 1   Suicidal thoughts 0 - 0 0 0    PHQ-9 Score 20 - 18 19 16   Difficult doing work/chores Very difficult - - - Very difficult   GAD 7 : Generalized Anxiety Score 12/15/2016  Nervous, Anxious, on Edge 2  Control/stop worrying 2  Worry too much - different things 3  Trouble relaxing 2  Restless 0  Easily annoyed or irritable 2  Afraid - awful might happen 2  Total GAD 7 Score 13  Anxiety Difficulty Extremely difficult   Relevant past medical, surgical, family and social history reviewed and updated as indicated. Interim medical history since our last visit reviewed. Allergies and medications reviewed and updated.  Review of Systems  Constitutional: Negative.   Respiratory: Negative.   Cardiovascular: Negative.   Musculoskeletal: Positive for arthralgias, back pain, myalgias, neck pain and neck stiffness. Negative for gait problem and joint swelling.  Psychiatric/Behavioral: Negative.     Per HPI unless specifically indicated above     Objective:    BP 133/73 (BP Location: Left Arm, Patient Position: Sitting, Cuff Size: Normal)   Pulse (!) 107   Temp 98.8 F (37.1 C)   Wt 175 lb 8 oz (79.6 kg)   SpO2 100%   BMI 33.16 kg/m  Wt Readings from Last 3 Encounters:  12/13/16 175 lb 8 oz (79.6 kg)  12/05/16 177 lb (80.3 kg)  12/05/16 177 lb 4 oz (80.4 kg)    Physical Exam  Constitutional: She is oriented to person, place, and time. She appears well-developed and well-nourished. No distress.  HENT:  Head: Normocephalic and atraumatic.  Right Ear: Hearing normal.  Left Ear: Hearing normal.  Nose: Nose normal.  Eyes: Conjunctivae and lids are normal. Right eye exhibits no discharge. Left eye exhibits no discharge. No scleral icterus.  Cardiovascular: Normal rate, regular rhythm, normal heart sounds and intact distal pulses.  Exam reveals no gallop and no friction rub.   No murmur heard. Pulmonary/Chest: Effort normal and breath sounds normal. No respiratory distress. She has no wheezes. She has no rales. She  exhibits no tenderness.  Musculoskeletal: Normal range of motion.  Neurological: She is alert and oriented to person, place, and time.  Skin: Skin is warm, dry and intact. No rash noted. She is not diaphoretic. No erythema. No pallor.  Psychiatric: Her speech is normal and behavior is normal. Judgment and thought content normal. Her mood appears anxious. Cognition and memory are normal. She exhibits a depressed mood.  Nursing note and vitals reviewed.   Results for orders placed or performed in visit on 12/05/16  Compliance Drug Analysis, Ur  Result Value Ref Range   Summary FINAL       Assessment & Plan:   Problem List Items Addressed This Visit      Other   Anxiety - Primary    Discussed with patient that we do not treat her anxiety. We have no records available in terms of what she has been on previously. We recommended that at this time, she go back on her 1/2 xanax qHS- we will continue to treat her chronic pain on that dose. She will follow up with her psychiatrist in 2 weeks as scheduled, but will call him to see if he can adjust her medicine sooner than that. She was not happy with this plan.       Chronic pain    Saw pain management. They will not prescribe her opiates without her being off her xanax. She would like [place] to give her something else for her sleep and anxiety as she wants to get off the xanax. Has not taken the xanax in several days and feeling terrible. We discussed that we do not treat her anxiety and she will need to follow up with her psychiatrist in order to get her anxiety medication changed. We reassured her that we will continue to bridge her opiates as long as she doesn't take them at the same time (4-5 hours apart) until she is off her xanax and can get back in with the pain clinic. She would like things to be moving faster and was not happy with this plan, but agreed to it.       Chronic pelvic pain in female    Has not had her MRI of her pelvis yet. Was  supposed to have it today, but canceled it to come here. Will get MRI rescheduled. Await results. Referral to pelvic PT placed last visit. Call with any concerns.           Follow up plan: Return As scheduled.  Greater than 25 minutes spent in direct patient interaction with >50% of that with coordination of care and explanation

## 2016-12-15 ENCOUNTER — Ambulatory Visit
Admission: RE | Admit: 2016-12-15 | Discharge: 2016-12-15 | Disposition: A | Discharge status: Home / Self Care | Payer: Medicare Other | Source: Ambulatory Visit | Attending: Family Medicine | Admitting: Family Medicine

## 2016-12-15 DIAGNOSIS — R102 Pelvic and perineal pain: Secondary | ICD-10-CM | POA: Diagnosis not present

## 2016-12-15 DIAGNOSIS — G8929 Other chronic pain: Secondary | ICD-10-CM | POA: Diagnosis not present

## 2016-12-15 MED ORDER — GADOBENATE DIMEGLUMINE 529 MG/ML IV SOLN
15.0000 mL | Freq: Once | INTRAVENOUS | Status: AC | PRN
  Administered 2016-12-15: 15 mL via INTRAVENOUS

## 2016-12-15 NOTE — Assessment & Plan Note (Signed)
Saw pain management. They will not prescribe her opiates without her being off her xanax. She would like [place] to give her something else for her sleep and anxiety as she wants to get off the xanax. Has not taken the xanax in several days and feeling terrible. We discussed that we do not treat her anxiety and she will need to follow up with her psychiatrist in order to get her anxiety medication changed. We reassured her that we will continue to bridge her opiates as long as she doesn't take them at the same time (4-5 hours apart) until she is off her xanax and can get back in with the pain clinic. She would like things to be moving faster and was not happy with this plan, but agreed to it.

## 2016-12-15 NOTE — Assessment & Plan Note (Signed)
Has not had her MRI of her pelvis yet. Was supposed to have it today, but canceled it to come here. Will get MRI rescheduled. Await results. Referral to pelvic PT placed last visit. Call with any concerns.

## 2016-12-15 NOTE — Assessment & Plan Note (Signed)
Discussed with patient that we do not treat her anxiety. We have no records available in terms of what she has been on previously. We recommended that at this time, she go back on her 1/2 xanax qHS- we will continue to treat her chronic pain on that dose. She will follow up with her psychiatrist in 2 weeks as scheduled, but will call him to see if he can adjust her medicine sooner than that. She was not happy with this plan.

## 2016-12-18 ENCOUNTER — Telehealth: Payer: Self-pay | Admitting: Family Medicine

## 2016-12-18 ENCOUNTER — Ambulatory Visit (HOSPITAL_BASED_OUTPATIENT_CLINIC_OR_DEPARTMENT_OTHER): Payer: Medicare Other | Admitting: Student in an Organized Health Care Education/Training Program

## 2016-12-18 ENCOUNTER — Ambulatory Visit
Admission: RE | Admit: 2016-12-18 | Discharge: 2016-12-18 | Disposition: A | Discharge status: Home / Self Care | Payer: Medicare Other | Source: Ambulatory Visit | Attending: Student in an Organized Health Care Education/Training Program | Admitting: Student in an Organized Health Care Education/Training Program

## 2016-12-18 ENCOUNTER — Encounter: Payer: Self-pay | Admitting: Student in an Organized Health Care Education/Training Program

## 2016-12-18 VITALS — BP 96/55 | HR 85 | Temp 98.4°F | Resp 16 | Ht 61.0 in | Wt 177.0 lb

## 2016-12-18 DIAGNOSIS — Z888 Allergy status to other drugs, medicaments and biological substances status: Secondary | ICD-10-CM | POA: Insufficient documentation

## 2016-12-18 DIAGNOSIS — G894 Chronic pain syndrome: Secondary | ICD-10-CM

## 2016-12-18 DIAGNOSIS — M069 Rheumatoid arthritis, unspecified: Secondary | ICD-10-CM

## 2016-12-18 DIAGNOSIS — Z886 Allergy status to analgesic agent status: Secondary | ICD-10-CM | POA: Insufficient documentation

## 2016-12-18 DIAGNOSIS — R5382 Chronic fatigue, unspecified: Secondary | ICD-10-CM | POA: Diagnosis not present

## 2016-12-18 DIAGNOSIS — Z79899 Other long term (current) drug therapy: Secondary | ICD-10-CM | POA: Insufficient documentation

## 2016-12-18 DIAGNOSIS — M47816 Spondylosis without myelopathy or radiculopathy, lumbar region: Secondary | ICD-10-CM | POA: Insufficient documentation

## 2016-12-18 DIAGNOSIS — M545 Low back pain: Secondary | ICD-10-CM | POA: Diagnosis present

## 2016-12-18 MED ORDER — FENTANYL CITRATE (PF) 100 MCG/2ML IJ SOLN: 25.0000 ug | INTRAMUSCULAR | Status: DC | PRN

## 2016-12-18 MED ORDER — LIDOCAINE HCL (PF) 1 % IJ SOLN
10.0000 mL | Freq: Once | INTRAMUSCULAR | Status: AC
  Administered 2016-12-18: 5 mL
  Filled 2016-12-18: qty 10

## 2016-12-18 MED ORDER — ROPIVACAINE HCL 2 MG/ML IJ SOLN
10.0000 mL | Freq: Once | INTRAMUSCULAR | Status: AC
  Administered 2016-12-18: 10 mL
  Filled 2016-12-18: qty 10

## 2016-12-18 NOTE — Telephone Encounter (Signed)
Please let her know that her MRI came back normal. No sign of anything that should be causing her pain. Thanks!

## 2016-12-18 NOTE — Progress Notes (Signed)
Safety precautions to be maintained throughout the outpatient stay will include: orient to surroundings, keep bed in low position, maintain call bell within reach at all times, provide assistance with transfer out of bed and ambulation.  

## 2016-12-18 NOTE — Patient Instructions (Addendum)
GENERAL RISKS AND COMPLICATIONS  What are the risk, side effects and possible complications? Generally speaking, most procedures are safe.  However, with any procedure there are risks, side effects, and the possibility of complications.  The risks and complications are dependent upon the sites that are lesioned, or the type of nerve block to be performed.  The closer the procedure is to the spine, the more serious the risks are.  Great care is taken when placing the radio frequency needles, block needles or lesioning probes, but sometimes complications can occur. 1. Infection: Any time there is an injection through the skin, there is a risk of infection.  This is why sterile conditions are used for these blocks.  There are four possible types of infection. 1. Localized skin infection. 2. Central Nervous System Infection-This can be in the form of Meningitis, which can be deadly. 3. Epidural Infections-This can be in the form of an epidural abscess, which can cause pressure inside of the spine, causing compression of the spinal cord with subsequent paralysis. This would require an emergency surgery to decompress, and there are no guarantees that the patient would recover from the paralysis. 4. Discitis-This is an infection of the intervertebral discs.  It occurs in about 1% of discography procedures.  It is difficult to treat and it may lead to surgery.        2. Pain: the needles have to go through skin and soft tissues, will cause soreness.       3. Damage to internal structures:  The nerves to be lesioned may be near blood vessels or    other nerves which can be potentially damaged.       4. Bleeding: Bleeding is more common if the patient is taking blood thinners such as  aspirin, Coumadin, Ticiid, Plavix, etc., or if he/she have some genetic predisposition  such as hemophilia. Bleeding into the spinal canal can cause compression of the spinal  cord with subsequent paralysis.  This would require an  emergency surgery to  decompress and there are no guarantees that the patient would recover from the  paralysis.       5. Pneumothorax:  Puncturing of a lung is a possibility, every time a needle is introduced in  the area of the chest or upper back.  Pneumothorax refers to free air around the  collapsed lung(s), inside of the thoracic cavity (chest cavity).  Another two possible  complications related to a similar event would include: Hemothorax and Chylothorax.   These are variations of the Pneumothorax, where instead of air around the collapsed  lung(s), you may have blood or chyle, respectively.       6. Spinal headaches: They may occur with any procedures in the area of the spine.       7. Persistent CSF (Cerebro-Spinal Fluid) leakage: This is a rare problem, but may occur  with prolonged intrathecal or epidural catheters either due to the formation of a fistulous  track or a dural tear.       8. Nerve damage: By working so close to the spinal cord, there is always a possibility of  nerve damage, which could be as serious as a permanent spinal cord injury with  paralysis.       9. Death:  Although rare, severe deadly allergic reactions known as "Anaphylactic  reaction" can occur to any of the medications used.      10. Worsening of the symptoms:  We can always make thing worse.    What are the chances of something like this happening? Chances of any of this occuring are extremely low.  By statistics, you have more of a chance of getting killed in a motor vehicle accident: while driving to the hospital than any of the above occurring .  Nevertheless, you should be aware that they are possibilities.  In general, it is similar to taking a shower.  Everybody knows that you can slip, hit your head and get killed.  Does that mean that you should not shower again?  Nevertheless always keep in mind that statistics do not mean anything if you happen to be on the wrong side of them.  Even if a procedure has a 1  (one) in a 1,000,000 (million) chance of going wrong, it you happen to be that one..Also, keep in mind that by statistics, you have more of a chance of having something go wrong when taking medications.  Who should not have this procedure? If you are on a blood thinning medication (e.g. Coumadin, Plavix, see list of "Blood Thinners"), or if you have an active infection going on, you should not have the procedure.  If you are taking any blood thinners, please inform your physician.  How should I prepare for this procedure?  Do not eat or drink anything at least six hours prior to the procedure.  Bring a driver with you .  It cannot be a taxi.  Come accompanied by an adult that can drive you back, and that is strong enough to help you if your legs get weak or numb from the local anesthetic.  Take all of your medicines the morning of the procedure with just enough water to swallow them.  If you have diabetes, make sure that you are scheduled to have your procedure done first thing in the morning, whenever possible.  If you have diabetes, take only half of your insulin dose and notify our nurse that you have done so as soon as you arrive at the clinic.  If you are diabetic, but only take blood sugar pills (oral hypoglycemic), then do not take them on the morning of your procedure.  You may take them after you have had the procedure.  Do not take aspirin or any aspirin-containing medications, at least eleven (11) days prior to the procedure.  They may prolong bleeding.  Wear loose fitting clothing that may be easy to take off and that you would not mind if it got stained with Betadine or blood.  Do not wear any jewelry or perfume  Remove any nail coloring.  It will interfere with some of our monitoring equipment.  NOTE: Remember that this is not meant to be interpreted as a complete list of all possible complications.  Unforeseen problems may occur.  BLOOD THINNERS The following drugs  contain aspirin or other products, which can cause increased bleeding during surgery and should not be taken for 2 weeks prior to and 1 week after surgery.  If you should need take something for relief of minor pain, you may take acetaminophen which is found in Tylenol,m Datril, Anacin-3 and Panadol. It is not blood thinner. The products listed below are.  Do not take any of the products listed below in addition to any listed on your instruction sheet.  A.P.C or A.P.C with Codeine Codeine Phosphate Capsules #3 Ibuprofen Ridaura  ABC compound Congesprin Imuran rimadil  Advil Cope Indocin Robaxisal  Alka-Seltzer Effervescent Pain Reliever and Antacid Coricidin or Coricidin-D  Indomethacin Rufen    Alka-Seltzer plus Cold Medicine Cosprin Ketoprofen S-A-C Tablets  Anacin Analgesic Tablets or Capsules Coumadin Korlgesic Salflex  Anacin Extra Strength Analgesic tablets or capsules CP-2 Tablets Lanoril Salicylate  Anaprox Cuprimine Capsules Levenox Salocol  Anexsia-D Dalteparin Magan Salsalate  Anodynos Darvon compound Magnesium Salicylate Sine-off  Ansaid Dasin Capsules Magsal Sodium Salicylate  Anturane Depen Capsules Marnal Soma  APF Arthritis pain formula Dewitt's Pills Measurin Stanback  Argesic Dia-Gesic Meclofenamic Sulfinpyrazone  Arthritis Bayer Timed Release Aspirin Diclofenac Meclomen Sulindac  Arthritis pain formula Anacin Dicumarol Medipren Supac  Analgesic (Safety coated) Arthralgen Diffunasal Mefanamic Suprofen  Arthritis Strength Bufferin Dihydrocodeine Mepro Compound Suprol  Arthropan liquid Dopirydamole Methcarbomol with Aspirin Synalgos  ASA tablets/Enseals Disalcid Micrainin Tagament  Ascriptin Doan's Midol Talwin  Ascriptin A/D Dolene Mobidin Tanderil  Ascriptin Extra Strength Dolobid Moblgesic Ticlid  Ascriptin with Codeine Doloprin or Doloprin with Codeine Momentum Tolectin  Asperbuf Duoprin Mono-gesic Trendar  Aspergum Duradyne Motrin or Motrin IB Triminicin  Aspirin  plain, buffered or enteric coated Durasal Myochrisine Trigesic  Aspirin Suppositories Easprin Nalfon Trillsate  Aspirin with Codeine Ecotrin Regular or Extra Strength Naprosyn Uracel  Atromid-S Efficin Naproxen Ursinus  Auranofin Capsules Elmiron Neocylate Vanquish  Axotal Emagrin Norgesic Verin  Azathioprine Empirin or Empirin with Codeine Normiflo Vitamin E  Azolid Emprazil Nuprin Voltaren  Bayer Aspirin plain, buffered or children's or timed BC Tablets or powders Encaprin Orgaran Warfarin Sodium  Buff-a-Comp Enoxaparin Orudis Zorpin  Buff-a-Comp with Codeine Equegesic Os-Cal-Gesic   Buffaprin Excedrin plain, buffered or Extra Strength Oxalid   Bufferin Arthritis Strength Feldene Oxphenbutazone   Bufferin plain or Extra Strength Feldene Capsules Oxycodone with Aspirin   Bufferin with Codeine Fenoprofen Fenoprofen Pabalate or Pabalate-SF   Buffets II Flogesic Panagesic   Buffinol plain or Extra Strength Florinal or Florinal with Codeine Panwarfarin   Buf-Tabs Flurbiprofen Penicillamine   Butalbital Compound Four-way cold tablets Penicillin   Butazolidin Fragmin Pepto-Bismol   Carbenicillin Geminisyn Percodan   Carna Arthritis Reliever Geopen Persantine   Carprofen Gold's salt Persistin   Chloramphenicol Goody's Phenylbutazone   Chloromycetin Haltrain Piroxlcam   Clmetidine heparin Plaquenil   Cllnoril Hyco-pap Ponstel   Clofibrate Hydroxy chloroquine Propoxyphen         Before stopping any of these medications, be sure to consult the physician who ordered them.  Some, such as Coumadin (Warfarin) are ordered to prevent or treat serious conditions such as "deep thrombosis", "pumonary embolisms", and other heart problems.  The amount of time that you may need off of the medication may also vary with the medication and the reason for which you were taking it.  If you are taking any of these medications, please make sure you notify your pain physician before you undergo any  procedures.         Pain Management Discharge Instructions  General Discharge Instructions :  If you need to reach your doctor call: Monday-Friday 8:00 am - 4:00 pm at [phone] or toll free [phone].  After clinic hours [phone] to have operator reach doctor.  Bring all of your medication bottles to all your appointments in the pain clinic.  To cancel or reschedule your appointment with Pain Management please remember to call 24 hours in advance to avoid a fee.  Refer to the educational materials which you have been given on: General Risks, I had my Procedure. Discharge Instructions, Post Sedation.  Post Procedure Instructions:  The drugs you were given will stay in your system until tomorrow, so for the next 24 hours you should   not drive, make any legal decisions or drink any alcoholic beverages.  You may eat anything you prefer, but it is better to start with liquids then soups and crackers, and gradually work up to solid foods.  Please notify your doctor immediately if you have any unusual bleeding, trouble breathing or pain that is not related to your normal pain.  Depending on the type of procedure that was done, some parts of your body may feel week and/or numb.  This usually clears up by tonight or the next day.  Walk with the use of an assistive device or accompanied by an adult for the 24 hours.  You may use ice on the affected area for the first 24 hours.  Put ice in a Ziploc bag and cover with a towel and place against area 15 minutes on 15 minutes off.  You may switch to heat after 24 hours.

## 2016-12-18 NOTE — Telephone Encounter (Signed)
Patient notified

## 2016-12-18 NOTE — Progress Notes (Signed)
Patient's Name: Sharon Rodriguez  MRN: [id number]  Referring Provider: Gillis Santa, MD  DOB: 06/26/1980  PCP: Valerie Roys, DO  DOS: 12/18/2016  Note by: Gillis Santa, MD  Service setting: Ambulatory outpatient  Specialty: Interventional Pain Management  Patient type: Established  Location: [facility] (AMB) Pain Management Facility  Visit type: Interventional Procedure   Primary Reason for Visit: Interventional Pain Management Treatment. CC: Back Pain (lower right is worse)  Procedure:  Anesthesia, Analgesia, Anxiolysis:  Type: Diagnostic Medial Branch Facet Block #1 Region: Lumbar Level:  L3, L4, L5, Medial Branch Level(s) Laterality: Bilateral  Type: Local Anesthesia Local Anesthetic: Lidocaine 1% Route: Infiltration ([place]/IM) IV Access: Secured Sedation: Meaningful verbal contact was maintained at all times during the procedure  Indication(s): Analgesia and Anxiety   Indications: 1. Spondylosis without myelopathy or radiculopathy, lumbar region   2. Rheumatoid arthritis, involving unspecified site, unspecified rheumatoid factor presence ([place])   3. Chronic fatigue   4. Chronic pain syndrome    Pain Score: Pre-procedure: 7 /10 Post-procedure: 7 /10  Pre-op Assessment:  Sharon Rodriguez is a 36 y.o. (year old), female patient, seen today for interventional treatment. She  has a past surgical history that includes Cesarean section. Sharon Rodriguez has a current medication list which includes the following prescription(s): albuterol, amphetamine-dextroamphetamine, levothyroxine, oxycodone-acetaminophen, vitamin d (ergocalciferol), alprazolam, lidocaine, lithium carbonate, naproxen, tizanidine, and triamcinolone ointment, and the following Facility-Administered Medications: fentanyl. Her primarily concern today is the Back Pain (lower right is worse)  Initial Vital Signs: Blood pressure 111/62, pulse 83, temperature 98.4 F (36.9 C), temperature source Oral, resp. rate 16, height 5\' 1"  (1.549 m),  weight 177 lb (80.3 kg), last menstrual period 12/11/2016, SpO2 100 %. BMI: Estimated body mass index is 33.44 kg/m as calculated from the following:   Height as of this encounter: 5\' 1"  (1.549 m).   Weight as of this encounter: 177 lb (80.3 kg).  Risk Assessment: Allergies: Reviewed. She is allergic to ibuprofen and promethazine.  Allergy Precautions: None required Coagulopathies: Reviewed. None identified.  Blood-thinner therapy: None at this time Active Infection(s): Reviewed. None identified. Ms. Hair is afebrile  Site Confirmation: Sharon Rodriguez was asked to confirm the procedure and laterality before marking the site Procedure checklist: Completed Consent: Before the procedure and under the influence of no sedative(s), amnesic(s), or anxiolytics, the patient was informed of the treatment options, risks and possible complications. To fulfill our ethical and legal obligations, as recommended by the American Medical Association's Code of Ethics, I have informed the patient of my clinical impression; the nature and purpose of the treatment or procedure; the risks, benefits, and possible complications of the intervention; the alternatives, including doing nothing; the risk(s) and benefit(s) of the alternative treatment(s) or procedure(s); and the risk(s) and benefit(s) of doing nothing. The patient was provided information about the general risks and possible complications associated with the procedure. These may include, but are not limited to: failure to achieve desired goals, infection, bleeding, organ or nerve damage, allergic reactions, paralysis, and death. In addition, the patient was informed of those risks and complications associated to Spine-related procedures, such as failure to decrease pain; infection (i.e.: Meningitis, epidural or intraspinal abscess); bleeding (i.e.: epidural hematoma, subarachnoid hemorrhage, or any other type of intraspinal or peri-dural bleeding); organ or nerve  damage (i.e.: Any type of peripheral nerve, nerve root, or spinal cord injury) with subsequent damage to sensory, motor, and/or autonomic systems, resulting in permanent pain, numbness, and/or weakness of one or several areas of the  body; allergic reactions; (i.e.: anaphylactic reaction); and/or death. Furthermore, the patient was informed of those risks and complications associated with the medications. These include, but are not limited to: allergic reactions (i.e.: anaphylactic or anaphylactoid reaction(s)); adrenal axis suppression; blood sugar elevation that in diabetics may result in ketoacidosis or comma; water retention that in patients with history of congestive heart failure may result in shortness of breath, pulmonary edema, and decompensation with resultant heart failure; weight gain; swelling or edema; medication-induced neural toxicity; particulate matter embolism and blood vessel occlusion with resultant organ, and/or nervous system infarction; and/or aseptic necrosis of one or more joints. Finally, the patient was informed that Medicine is not an exact science; therefore, there is also the possibility of unforeseen or unpredictable risks and/or possible complications that may result in a catastrophic outcome. The patient indicated having understood very clearly. We have given the patient no guarantees and we have made no promises. Enough time was given to the patient to ask questions, all of which were answered to the patient's satisfaction. Sharon Rodriguez has indicated that she wanted to continue with the procedure. Attestation: I, the ordering provider, attest that I have discussed with the patient the benefits, risks, side-effects, alternatives, likelihood of achieving goals, and potential problems during recovery for the procedure that I have provided informed consent. Date: 12/18/2016; Time: 11:10 AM  Pre-Procedure Preparation:  Monitoring: As per clinic protocol. Respiration, ETCO2, SpO2, BP,  heart rate and rhythm monitor placed and checked for adequate function Safety Precautions: Patient was assessed for positional comfort and pressure points before starting the procedure. Time-out: I initiated and conducted the "Time-out" before starting the procedure, as per protocol. The patient was asked to participate by confirming the accuracy of the "Time Out" information. Verification of the correct person, site, and procedure were performed and confirmed by me, the nursing staff, and the patient. "Time-out" conducted as per Joint Commission's Universal Protocol (UP.01.01.01). "Time-out" Date & Time: 12/18/2016; 1225 hrs.  Description of Procedure Process:   Position: Prone Target Area: For Lumbar Facet blocks, the target is the groove formed by the junction of the transverse process and superior articular process. For the L5 dorsal ramus, the target is the notch between superior articular process and sacral ala.  Approach: Paramedial approach. Area Prepped: Entire Posterior Lumbosacral Region Prepping solution: ChloraPrep (2% chlorhexidine gluconate and 70% isopropyl alcohol) Safety Precautions: Aspiration looking for blood return was conducted prior to all injections. At no point did we inject any substances, as a needle was being advanced. No attempts were made at seeking any paresthesias. Safe injection practices and needle disposal techniques used. Medications properly checked for expiration dates. SDV (single dose vial) medications used. Description of the Procedure: Protocol guidelines were followed. The patient was placed in position over the fluoroscopy table. The target area was identified and the area prepped in the usual manner. Skin desensitized using vapocoolant spray. Skin & deeper tissues infiltrated with local anesthetic. Appropriate amount of time allowed to pass for local anesthetics to take effect. The procedure needle was introduced through the skin, ipsilateral to the reported  pain, and advanced to the target area. Employing the "Medial Branch Technique", the needles were advanced to the angle made by the superior and medial portion of the transverse process, and the lateral and inferior portion of the superior articulating process of the targeted vertebral bodies. This area is known as "[place]'s Eye" or the "Eye of the [place] Dog". A procedure needle was introduced through the skin, and this  time advanced to the angle made by the superior and medial border of the sacral ala, and the lateral border of the S1 vertebral body.  Negative aspiration confirmed. Solution injected in intermittent fashion, asking for systemic symptoms every 0.5cc of injectate. The needles were then removed and the area cleansed, making sure to leave some of the prepping solution back to take advantage of its long term bactericidal properties.   Illustration of the posterior view of the lumbar spine and the posterior neural structures. Laminae of L2 through S1 are labeled. DPRL5, dorsal primary ramus of L5; DPRS1, dorsal primary ramus of S1; DPR3, dorsal primary ramus of L3; FJ, facet (zygapophyseal) joint L3-L4; I, inferior articular process of L4; LB1, lateral branch of dorsal primary ramus of L1; IAB, inferior articular branches from L3 medial branch (supplies L4-L5 facet joint); IBP, intermediate branch plexus; MB3, medial branch of dorsal primary ramus of L3; NR3, third lumbar nerve root; S, superior articular process of L5; SAB, superior articular branches from L4 (supplies L4-5 facet joint also); TP3, transverse process of L3.  Vitals:   12/18/16 1227 12/18/16 1232 12/18/16 1237 12/18/16 1243  BP: 111/62 115/82 (!) 113/57 (!) 96/55  Pulse: 83 78 77 85  Resp: (!) 22 17 17 16   Temp:      TempSrc:      SpO2: 100% 100% 100% 100%  Weight:      Height:        Start Time: 1225 hrs. End Time: 1240 hrs. Materials:  Needle(s) Type: Regular needle Gauge: 22G Length: 3.5-in Medication(s): We  administered ropivacaine (PF) 2 mg/mL (0.2%) and lidocaine (PF). Please see chart orders for dosing details. 1.5 cc of 0.2% ropivacaine at each level. Imaging Guidance (Spinal):  Type of Imaging Technique: Fluoroscopy Guidance (Spinal) Indication(s): Assistance in needle guidance and placement for procedures requiring needle placement in or near specific anatomical locations not easily accessible without such assistance. Exposure Time: Please see nurses notes. Contrast: None used. Fluoroscopic Guidance: I was personally present during the use of fluoroscopy. "Tunnel Vision Technique" used to obtain the best possible view of the target area. Parallax error corrected before commencing the procedure. "Direction-depth-direction" technique used to introduce the needle under continuous pulsed fluoroscopy. Once target was reached, antero-posterior, oblique, and lateral fluoroscopic projection used confirm needle placement in all planes. Images permanently stored in EMR. Interpretation: No contrast injected. I personally interpreted the imaging intraoperatively. Adequate needle placement confirmed in multiple planes. Permanent images saved into the patient's record.  Antibiotic Prophylaxis:  Indication(s): None identified Antibiotic given: None  Post-operative Assessment:  EBL: None Complications: No immediate post-treatment complications observed by team, or reported by patient. Note: The patient tolerated the entire procedure well. A repeat set of vitals were taken after the procedure and the patient was kept under observation following institutional policy, for this type of procedure. Post-procedural neurological assessment was performed, showing return to baseline, prior to discharge. The patient was provided with post-procedure discharge instructions, including a section on how to identify potential problems. Should any problems arise concerning this procedure, the patient was given instructions to  immediately contact us, at any time, without hesitation. In any case, we plan to contact the patient by telephone for a follow-up status report regarding this interventional procedure. Comments:  No additional relevant information.   5 out of 5 strength bilateral lower extremity: Plantar flexion, dorsiflexion, knee flexion, knee extension. Plan of Care    Imaging Orders     DG C-Arm 1-60 Min-No Report Procedure  Orders    No procedure(s) ordered today    Medications ordered for procedure: Meds ordered this encounter  Medications  . fentaNYL (SUBLIMAZE) injection 25-50 mcg    Make sure Narcan is available in the pyxis when using this medication. In the event of respiratory depression (RR< 8/min): Titrate NARCAN (naloxone) in increments of 0.1 to 0.2 mg IV at 2-3 minute intervals, until desired degree of reversal.  . ropivacaine (PF) 2 mg/mL (0.2%) (NAROPIN) injection 10 mL  . lidocaine (PF) (XYLOCAINE) 1 % injection 10 mL   Medications administered: We administered ropivacaine (PF) 2 mg/mL (0.2%) and lidocaine (PF).  See the medical record for exact dosing, route, and time of administration.  New Prescriptions   No medications on file   Disposition: Discharge home  Discharge Date & Time: 12/18/2016; 1250 hrs.   Physician-requested Follow-up: Return in about 2 weeks (around 01/01/2017) for Post Procedure Evaluation. Future Appointments Date Time Provider [place]  01/02/2017 1:00 PM Valerie Roys, DO [facility] None   Primary Care Physician: Valerie Roys, DO Location: [facility] Note by: Gillis Santa, MD Date: 12/18/2016; Time: 2:04 PM  Disclaimer:  Medicine is not an exact science. The only guarantee in medicine is that nothing is guaranteed. It is important to note that the decision to proceed with this intervention was based on the information collected from the patient. The Data and conclusions were drawn from the patient's  questionnaire, the interview, and the physical examination. Because the information was provided in large part by the patient, it cannot be guaranteed that it has not been purposely or unconsciously manipulated. Every effort has been made to obtain as much relevant data as possible for this evaluation. It is important to note that the conclusions that lead to this procedure are derived in large part from the available data. Always take into account that the treatment will also be dependent on availability of resources and existing treatment guidelines, considered by other Pain Management Practitioners as being common knowledge and practice, at the time of the intervention. For Medico-Legal purposes, it is also important to point out that variation in procedural techniques and pharmacological choices are the acceptable norm. The indications, contraindications, technique, and results of the above procedure should only be interpreted and judged by a Board-Certified Interventional Pain Specialist with extensive familiarity and expertise in the same exact procedure and technique.

## 2016-12-19 ENCOUNTER — Ambulatory Visit: Payer: Medicare Other

## 2016-12-28 DIAGNOSIS — H6691 Otitis media, unspecified, right ear: Secondary | ICD-10-CM | POA: Diagnosis not present

## 2017-01-02 ENCOUNTER — Ambulatory Visit (INDEPENDENT_AMBULATORY_CARE_PROVIDER_SITE_OTHER): Payer: Medicare Other | Admitting: Family Medicine

## 2017-01-02 ENCOUNTER — Encounter: Payer: Self-pay | Admitting: Family Medicine

## 2017-01-02 VITALS — BP 118/74 | HR 97 | Temp 98.4°F | Wt 180.3 lb

## 2017-01-02 DIAGNOSIS — M9906 Segmental and somatic dysfunction of lower extremity: Secondary | ICD-10-CM

## 2017-01-02 DIAGNOSIS — G8929 Other chronic pain: Secondary | ICD-10-CM

## 2017-01-02 DIAGNOSIS — M9904 Segmental and somatic dysfunction of sacral region: Secondary | ICD-10-CM | POA: Diagnosis not present

## 2017-01-02 DIAGNOSIS — M9905 Segmental and somatic dysfunction of pelvic region: Secondary | ICD-10-CM

## 2017-01-02 DIAGNOSIS — M9900 Segmental and somatic dysfunction of head region: Secondary | ICD-10-CM

## 2017-01-02 DIAGNOSIS — M9902 Segmental and somatic dysfunction of thoracic region: Secondary | ICD-10-CM | POA: Diagnosis not present

## 2017-01-02 DIAGNOSIS — R102 Pelvic and perineal pain: Secondary | ICD-10-CM | POA: Diagnosis not present

## 2017-01-02 DIAGNOSIS — M9909 Segmental and somatic dysfunction of abdomen and other regions: Secondary | ICD-10-CM | POA: Diagnosis not present

## 2017-01-02 DIAGNOSIS — M9901 Segmental and somatic dysfunction of cervical region: Secondary | ICD-10-CM | POA: Diagnosis not present

## 2017-01-02 DIAGNOSIS — M9903 Segmental and somatic dysfunction of lumbar region: Secondary | ICD-10-CM

## 2017-01-02 MED ORDER — OXYCODONE-ACETAMINOPHEN 5-325 MG PO TABS: 1.0000 | ORAL_TABLET | Freq: Four times a day (QID) | ORAL | 0 refills | Status: DC | PRN

## 2017-01-02 NOTE — Progress Notes (Signed)
BP 118/74 (BP Location: Left Arm, Patient Position: Sitting, Cuff Size: Normal)   Pulse 97   Temp 98.4 F (36.9 C)   Wt 180 lb 5 oz (81.8 kg)   LMP 12/11/2016   SpO2 97%   BMI 34.07 kg/m    Subjective:    Patient ID: Sharon Rodriguez, female    DOB: 07-31-1980, 36 y.o.   MRN: [id number]  HPI: Sharon Rodriguez is a 36 y.o. female  Chief Complaint  Patient presents with  . Pain  . Pelvic Pain   CHRONIC PAIN- had a shot in her low back on 12/18/16- feeling like back is doing a bit better.  Present dose: 60 Morphine equivalents Pain control status: stable Duration: chronic Location: wide spread Quality: aching and tight and throbbing Current Pain Level: mild Previous Pain Level: severe Breakthrough pain:yes- in her pelvis Benefit from narcotic medications:yes What Activities task can be accomplished with current medication?- able to take care of her children Interested in weaning off narcotics:no Stool softners/OTC fiber: no Previous pain specialty evaluation: yes Non-narcotic analgesic meds: no Narcotic contract: yes  Has not seen her psychiatrist because her car wouldn't start. She notes that she hasn't been able to see him. She is seeing him again in the beginning of December.   [place] and [place] in [place] lot of pain. She notes that walking and trying to lift anything seems to make it work. Had the MRI which was normal. Has been referred back to PT. Hasn't started it yet due to cost and not currently having a car. She notes that her pelvis feels tight and sore. Worse with standing and better with sitting. Stable on current medication. Better with OMT. Pain radiates into her low back and legs. She is otherwise feeling OK with no other concerns or complaints at this time.   Relevant past medical, surgical, family and social history reviewed and updated as indicated. Interim medical history since our last visit reviewed. Allergies and medications reviewed and  updated.  Review of Systems  Constitutional: Negative.   Respiratory: Negative.   Cardiovascular: Negative.   Genitourinary: Positive for pelvic pain. Negative for decreased urine volume, difficulty urinating, dyspareunia, dysuria, enuresis, flank pain, frequency, genital sores, hematuria, menstrual problem, urgency, vaginal bleeding, vaginal discharge and vaginal pain.  Musculoskeletal: Positive for arthralgias, back pain, gait problem, myalgias, neck pain and neck stiffness. Negative for joint swelling.  Neurological: Negative for dizziness, tremors, seizures, syncope, facial asymmetry, speech difficulty, weakness, light-headedness, numbness and headaches.  Psychiatric/Behavioral: Negative.     Per HPI unless specifically indicated above     Objective:    BP 118/74 (BP Location: Left Arm, Patient Position: Sitting, Cuff Size: Normal)   Pulse 97   Temp 98.4 F (36.9 C)   Wt 180 lb 5 oz (81.8 kg)   LMP 12/11/2016   SpO2 97%   BMI 34.07 kg/m   Wt Readings from Last 3 Encounters:  01/02/17 180 lb 5 oz (81.8 kg)  12/18/16 177 lb (80.3 kg)  12/13/16 175 lb 8 oz (79.6 kg)    Physical Exam  Constitutional: She is oriented to person, place, and time. She appears well-developed and well-nourished. No distress.  HENT:  Head: Normocephalic and atraumatic.  Right Ear: Hearing normal.  Left Ear: Hearing normal.  Nose: Nose normal.  Eyes: Conjunctivae and lids are normal. Right eye exhibits no discharge. Left eye exhibits no discharge. No scleral icterus.  Cardiovascular: Normal rate, regular rhythm, normal  heart sounds and intact distal pulses.  Exam reveals no gallop and no friction rub.   No murmur heard. Pulmonary/Chest: Effort normal and breath sounds normal. No respiratory distress. She has no wheezes. She has no rales. She exhibits no tenderness.  Abdominal: Soft. She exhibits no distension and no mass. There is no tenderness. There is no rebound and no guarding.  Neurological:  She is alert and oriented to person, place, and time.  Skin: Skin is warm, dry and intact. No rash noted. She is not diaphoretic. No erythema. No pallor.  Psychiatric: She has a normal mood and affect. Her speech is normal and behavior is normal. Judgment and thought content normal. Cognition and memory are normal.  Nursing note and vitals reviewed. Musculoskeletal:  Exam found Decreased ROM, Tissue texture changes, Tenderness to palpation and Asymmetry of patient's  head, neck, thorax, lumbar, pelvis, sacrum, lower extremity and abdomen Osteopathic Structural Exam:   Head: OAESSR, OM suture restricted on the R, hypertonic suboccipital muscles  Neck: trap spasm on the R, C3ESRR, SCM hypertonic on the R  Thorax: T4-6SLRR, trap spasm on the R  Lumbar: QL hypertonic bilaterally R>L., L4-5SRRL  Pelvis: Posterior R innominate, SI joint restricted on the R  Sacrum: R on L torsion with SI joint restricted on the R, Interosseous strain S1-3 on the R  Lower Extremity: glut spasm on the R, IT band hypertonic on the R  Abdomen: diaphragm spasm bilaterally R>L, transverse colon restricted, pelvic diaphragm restricted with fascial drag into R hip  Results for orders placed or performed in visit on 12/05/16  Compliance Drug Analysis, Ur  Result Value Ref Range   Summary FINAL       Assessment & Plan:   Problem List Items Addressed This Visit      Other   Chronic pain - Primary    We have taken over her chronic pain management from rheumatology due to issues with transportation. She is aware that we do not do long term pain management but will bridge her until she gets into see pain management and is off her xanax. We will continue to bridge her chronic pain medicine until she sees them and they determine if they will take over or not. PMP reviewed today and appropriate. Refill of her medicine given today. Follow up 1 month.       Relevant Medications   oxyCODONE-acetaminophen (PERCOCET) 5-325 MG  tablet   Chronic pelvic pain in female    This may be due to a leg length discrepancy, but seems to be primarily due to myofascial issues and also likely due 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  Also seems to be due to weak core. Discussed need to strengthen this, and she states that she cannot. Did better with PT, but has finished that now and has not continued with her exercises. Will get her back into PT- referral generated today. Points to her pubic symphysis as the primary location of her pain. States that she limps when she walks. 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 Asymmetric facet arthropathy of L4-L5, MRI of lumbar was last done in 2009- but going to be repeated by pain management. MRI of pelvis was done and was normal. Had lumbar facet block done about 2 weeks ago. Not sure if it helped. She does have some somatic dysfunctions that I think are contributing to her symptoms. I think she would benefit from OMT. Patient treated today  with fair results. Continue to monitor and recheck in as needed.      Relevant Medications   oxyCODONE-acetaminophen (PERCOCET) 5-325 MG tablet    Other Visit Diagnoses    Sacral region somatic dysfunction       Pelvic region somatic dysfunction       Somatic dysfunction of spine, lumbar       Thoracic region somatic dysfunction       Segmental dysfunction of abdomen       Somatic dysfunction of cervical region       Somatic dysfunction of lower extremity       Head region somatic dysfunction         After verbal consent was obtained, patient was treated today with osteopathic manipulative medicine to the regions of the head, neck, thorax, lumbar, pelvis, sacrum, abdomen and lower extremity using the techniques of cranial, myofascial release, counterstrain, muscle energy and soft tissue. Areas of compensation relating to her primary pain source also treated. Patient tolerated the procedure well with  good objective and fair subjective improvement in symptoms. She left the room in good condition. She was advised to stay well hydrated and that she may have some soreness following the procedure. If not improving or worsening, she will call and come in. She will return for reevaluation  in 1-2 months.   Follow up plan: Return in about 4 weeks (around 01/30/2017) for follow up pain, procedure visit.

## 2017-01-02 NOTE — Assessment & Plan Note (Signed)
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  Also seems to be due to weak core. Discussed need to strengthen this, and she states that she cannot. Did better with PT, but has finished that now and has not continued with her exercises. Will get her back into PT- referral generated today. Points to her pubic symphysis as the primary location of her pain. States that she limps when she walks. 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 Asymmetric facet arthropathy of L4-L5, MRI of lumbar was last done in 2009- but going to be repeated by pain management. MRI of pelvis was done and was normal. Had lumbar facet block done about 2 weeks ago. Not sure if it helped. She does have some somatic dysfunctions that I think are contributing to her symptoms. I think she would benefit from OMT. Patient treated today with fair results. Continue to monitor and recheck in as needed.

## 2017-01-02 NOTE — Assessment & Plan Note (Signed)
We have taken over her chronic pain management from rheumatology due to issues with transportation. She is aware that we do not do long term pain management but will bridge her until she gets into see pain management and is off her xanax. We will continue to bridge her chronic pain medicine until she sees them and they determine if they will take over or not. PMP reviewed today and appropriate. Refill of her medicine given today. Follow up 1 month.

## 2017-01-30 ENCOUNTER — Encounter: Payer: Self-pay | Admitting: Family Medicine

## 2017-01-30 ENCOUNTER — Ambulatory Visit (INDEPENDENT_AMBULATORY_CARE_PROVIDER_SITE_OTHER): Payer: Medicare Other | Admitting: Family Medicine

## 2017-01-30 VITALS — BP 125/81 | HR 90 | Temp 99.1°F | Wt 180.4 lb

## 2017-01-30 DIAGNOSIS — J42 Unspecified chronic bronchitis: Secondary | ICD-10-CM

## 2017-01-30 DIAGNOSIS — M9904 Segmental and somatic dysfunction of sacral region: Secondary | ICD-10-CM | POA: Diagnosis not present

## 2017-01-30 DIAGNOSIS — M9903 Segmental and somatic dysfunction of lumbar region: Secondary | ICD-10-CM | POA: Diagnosis not present

## 2017-01-30 DIAGNOSIS — R5383 Other fatigue: Secondary | ICD-10-CM | POA: Diagnosis not present

## 2017-01-30 DIAGNOSIS — M9900 Segmental and somatic dysfunction of head region: Secondary | ICD-10-CM | POA: Diagnosis not present

## 2017-01-30 DIAGNOSIS — M9902 Segmental and somatic dysfunction of thoracic region: Secondary | ICD-10-CM

## 2017-01-30 DIAGNOSIS — M9906 Segmental and somatic dysfunction of lower extremity: Secondary | ICD-10-CM

## 2017-01-30 DIAGNOSIS — R102 Pelvic and perineal pain: Secondary | ICD-10-CM | POA: Diagnosis not present

## 2017-01-30 DIAGNOSIS — M9908 Segmental and somatic dysfunction of rib cage: Secondary | ICD-10-CM | POA: Diagnosis not present

## 2017-01-30 DIAGNOSIS — M9905 Segmental and somatic dysfunction of pelvic region: Secondary | ICD-10-CM

## 2017-01-30 DIAGNOSIS — G8929 Other chronic pain: Secondary | ICD-10-CM | POA: Diagnosis not present

## 2017-01-30 DIAGNOSIS — M9909 Segmental and somatic dysfunction of abdomen and other regions: Secondary | ICD-10-CM | POA: Diagnosis not present

## 2017-01-30 DIAGNOSIS — R0602 Shortness of breath: Secondary | ICD-10-CM

## 2017-01-30 MED ORDER — AZITHROMYCIN 250 MG PO TABS: ORAL_TABLET | ORAL | 0 refills | Status: DC

## 2017-01-30 MED ORDER — ALBUTEROL SULFATE (2.5 MG/3ML) 0.083% IN NEBU
2.5000 mg | INHALATION_SOLUTION | Freq: Once | RESPIRATORY_TRACT | Status: AC
  Administered 2017-01-30: 2.5 mg via RESPIRATORY_TRACT

## 2017-01-30 MED ORDER — PREDNISONE 10 MG PO TABS: ORAL_TABLET | ORAL | 0 refills | Status: DC

## 2017-01-30 MED ORDER — OXYCODONE-ACETAMINOPHEN 5-325 MG PO TABS: 1.0000 | ORAL_TABLET | Freq: Four times a day (QID) | ORAL | 0 refills | Status: DC | PRN

## 2017-01-30 NOTE — Assessment & Plan Note (Signed)
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  Also seems to be due to weak core. Discussed need to strengthen this, and she states that she cannot. Did better with PT, but had finished that now and has not continued with her exercises. Have generated a referral to that, but she has not gone as she states that she feels too tired. Points to her pubic symphysis as the primary location of her pain. States that she limps when she walks. 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 Asymmetric facet arthropathy of L4-L5, MRI of lumbar was last done in 2009- but going to be repeated by pain management. MRI of pelvis was done and was normal. Had lumbar facet block done about 6 weeks ago. Not sure if it helped. She does have some somatic dysfunctions that I think are contributing to her symptoms. I think she would benefit from OMT. Patient treated today with fair results. Continue to monitor and recheck in as needed.

## 2017-01-30 NOTE — Assessment & Plan Note (Signed)
We have taken over her chronic pain management from rheumatology due to issues with transportation. She is aware that we do not do long term pain management but will bridge her until she gets into see pain management and is off her xanax. Doing better with shots from pain management- call them if she would like to continue them. Seeing psychiatry in 2 weeks to discuss weaning off the xanax and other medications for her mood, which is not under good control. PMP reviewed today and appropriate. Refill of her medicine given today. Follow up 1 month.

## 2017-01-30 NOTE — Progress Notes (Signed)
BP 125/81 (BP Location: Left Arm, Patient Position: Sitting, Cuff Size: Normal)   Pulse 90   Temp 99.1 F (37.3 C)   Wt 180 lb 7 oz (81.8 kg)   SpO2 98%   BMI 34.09 kg/m    Subjective:    Patient ID: Sharon Rodriguez, female    DOB: 1980-08-06, 36 y.o.   MRN: [id number]  HPI: Sharon Rodriguez is a 36 y.o. female  Chief Complaint  Patient presents with  . Pain  . Pelvic Pain  . URI   UPPER RESPIRATORY TRACT INFECTION Duration: couple of days Worst symptom: cough Fever: no Cough: yes Shortness of breath: no Wheezing: yes Chest pain: no Chest tightness: no Chest congestion: yes Nasal congestion: no Runny nose: no Post nasal drip: no Sneezing: no Sore throat: no Swollen glands: no Sinus pressure: no Headache: yes Face pain: no Toothache: yes Ear pain: no  Ear pressure: no  Eyes red/itching:yes Eye drainage/crusting: no  Vomiting: no Rash: no Fatigue: yes Sick contacts: yes Strep contacts: no  Context: fluctuating Recurrent sinusitis: no Relief with OTC cold/cough medications: no  Treatments attempted: none   CHRONIC PAIN- had a shot in her low back on 12/18/16- thinks that it worked for about 6 weeks and just started hurting again couple of ago, seeing her psychiatrist the first week in December Present dose: 62.5Morphine equivalents Pain control status: stable Duration: chronic Location: wide spread Quality: aching and tight and throbbing Current Pain Level: mild Previous Pain Level: severe Breakthrough pain:yes- pelvic pain Benefit from narcotic medications:yes What Activities task can be accomplished with current medication?- able to take care of her children Interested in weaning off narcotics:no Stool softners/OTC fiber: no Previous pain specialty evaluation: yes Non-narcotic analgesic meds: no Narcotic contract: yes  Pelvis is doing OK now. She notes that she has pain that comes and go. She has a very difficult time describing her pain  today. She notes that it is better when she sits and worse when she walks and will "bring her to her knees". It's sharp pain in nature. She does find OMT and PT helpful. Pain radiates into her low back. Pain is chronic. It has not particularly changed. No other concerns or complaints at this time.   Relevant past medical, surgical, family and social history reviewed and updated as indicated. Interim medical history since our last visit reviewed. Allergies and medications reviewed and updated.  Review of Systems  Constitutional: Positive for fatigue. Negative for activity change, appetite change, chills, diaphoresis, fever and unexpected weight change.  HENT: Negative.   Respiratory: Positive for cough, chest tightness, shortness of breath and wheezing. Negative for apnea, choking and stridor.   Cardiovascular: Negative.   Musculoskeletal: Positive for arthralgias, back pain, gait problem, joint swelling, myalgias, neck pain and neck stiffness.  Skin: Negative.   Neurological: Positive for headaches. Negative for dizziness, tremors, seizures, syncope, facial asymmetry, speech difficulty, weakness, light-headedness and numbness.  Psychiatric/Behavioral: Negative.     Per HPI unless specifically indicated above     Objective:    BP 125/81 (BP Location: Left Arm, Patient Position: Sitting, Cuff Size: Normal)   Pulse 90   Temp 99.1 F (37.3 C)   Wt 180 lb 7 oz (81.8 kg)   SpO2 98%   BMI 34.09 kg/m   Wt Readings from Last 3 Encounters:  01/30/17 180 lb 7 oz (81.8 kg)  01/02/17 180 lb 5 oz (81.8 kg)  12/18/16 177 lb (80.3 kg)    Physical  Exam  Constitutional: She is oriented to person, place, and time. She appears well-developed and well-nourished. No distress.  HENT:  Head: Normocephalic and atraumatic.  Right Ear: Hearing normal.  Left Ear: Hearing normal.  Nose: Nose normal.  Eyes: Conjunctivae and lids are normal. Right eye exhibits no discharge. Left eye exhibits no discharge.  No scleral icterus.  Cardiovascular: Normal rate, regular rhythm, normal heart sounds and intact distal pulses. Exam reveals no gallop and no friction rub.  No murmur heard. Pulmonary/Chest: Effort normal. No respiratory distress. She has wheezes. She has no rales. She exhibits no tenderness.  Neurological: She is alert and oriented to person, place, and time.  Skin: Skin is warm, dry and intact. No rash noted. She is not diaphoretic. No erythema. No pallor.  Psychiatric: She has a normal mood and affect. Her speech is normal and behavior is normal. Judgment and thought content normal. Cognition and memory are normal.  Nursing note and vitals reviewed.  Musculoskeletal:  Exam found Decreased ROM, Tissue texture changes, Tenderness to palpation and Asymmetry of patient's  head, thorax, ribs, lumbar, pelvis, sacrum, lower extremity and abdomen Osteopathic Structural Exam:   Head: hypertonic suboccipital muscles, OAESSR, OM suture restricted on the R, R temporal internally rotated and anterior  Thorax: T3-5SLRR  Ribs: Ribs 3-4 locked up on the R  Lumbar: QL hypertonic on the R, L4-5SLRR  Pelvis: Posterior R innominate, SI joint restricted on the L  Sacrum: L on L torsion  Lower Extremity: IT band hypertonic on the R, fascial strain from R knee into thigh into pelvis  Abdomen: diaphragm spasm bilaterally L>R, pelvic diaphragm restricted with pull into L hip  Results for orders placed or performed in visit on 12/05/16  Compliance Drug Analysis, Ur  Result Value Ref Range   Summary FINAL       Assessment & Plan:   Problem List Items Addressed This Visit      Respiratory   Chronic bronchitis ([place]) - Primary    Lungs clear after neb. Will treat with prednisone and azithromycin and recheck 2 weeks. Will start anoro for preventative. Call with any concerns.       Relevant Medications   albuterol (PROVENTIL) (2.5 MG/3ML) 0.083% nebulizer solution 2.5 mg (Completed)     Other   Chronic  pain    We have taken over her chronic pain management from rheumatology due to issues with transportation. She is aware that we do not do long term pain management but will bridge her until she gets into see pain management and is off her xanax. Doing better with shots from pain management- call them if she would like to continue them. Seeing psychiatry in 2 weeks to discuss weaning off the xanax and other medications for her mood, which is not under good control. PMP reviewed today and appropriate. Refill of her medicine given today. Follow up 1 month.       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  Also seems to be due to weak core. Discussed need to strengthen this, and she states that she cannot. Did better with PT, but had finished that now and has not continued with her exercises. Have generated a referral to that, but she  has not gone as she states that she feels too tired. Points to her pubic symphysis as the primary location of her pain. States that she limps when she walks. 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 Asymmetric facet arthropathy of L4-L5, MRI of lumbar was last done in 2009- but going to be repeated by pain management. MRI of pelvis was done and was normal. Had lumbar facet block done about 6 weeks ago. Not sure if it helped. She does have some somatic dysfunctions that I think are contributing to her symptoms. I think she would benefit from OMT. Patient treated today with fair results. Continue to monitor and recheck in as needed.      Relevant Medications   oxyCODONE-acetaminophen (PERCOCET) 5-325 MG tablet   predniSONE (DELTASONE) 10 MG tablet    Other Visit  Diagnoses    Fatigue, unspecified type       Likely due to mood- talking to psychiatry in about 2 weeks. Call with any concerns. Will check labs.    Relevant Orders   CBC with Differential/Platelet   TSH   Shortness of breath       Likely due to chronic bronchitis- see discussion under there.    Relevant Medications   albuterol (PROVENTIL) (2.5 MG/3ML) 0.083% nebulizer solution 2.5 mg (Completed)   Sacral region somatic dysfunction       Pelvic region somatic dysfunction       Somatic dysfunction of spine, lumbar       Thoracic region somatic dysfunction       Segmental dysfunction of abdomen       Somatic dysfunction of lower extremity       Head region somatic dysfunction       Rib cage region somatic dysfunction         After verbal consent was obtained, patient was treated today with osteopathic manipulative medicine to the regions of the head, thorax, ribs, lumbar, pelvis, sacrum, abdomen and lower extremity using the techniques of myofascial release, counterstrain, muscle energy and soft tissue. Areas of compensation relating to her primary pain source also treated. Patient tolerated the procedure well with good objective and fair subjective improvement in symptoms. .sex left the room in good condition. He was advised to stay well hydrated and that he may have some soreness following the procedure. If not improving or worsening, he will call and come in. She will return for reevaluation  In 3-4 weeks.   Follow up plan: Return 2 weeks lung recheck, 1 month 30 minute procedure for follow up pain .

## 2017-01-30 NOTE — Assessment & Plan Note (Signed)
Lungs clear after neb. Will treat with prednisone and azithromycin and recheck 2 weeks. Will start anoro for preventative. Call with any concerns.

## 2017-01-31 ENCOUNTER — Encounter: Payer: Self-pay | Admitting: Family Medicine

## 2017-01-31 ENCOUNTER — Ambulatory Visit: Payer: Medicare Other | Admitting: Physical Therapy

## 2017-01-31 LAB — CBC WITH DIFFERENTIAL/PLATELET
BASOS: 0 %
Basophils Absolute: 0 10*3/uL (ref 0.0–0.2)
EOS (ABSOLUTE): 0 10*3/uL (ref 0.0–0.4)
Eos: 0 %
HEMATOCRIT: 37.7 % (ref 34.0–46.6)
Hemoglobin: 12.4 g/dL (ref 11.1–15.9)
IMMATURE GRANULOCYTES: 0 %
Immature Grans (Abs): 0 10*3/uL (ref 0.0–0.1)
LYMPHS ABS: 2.5 10*3/uL (ref 0.7–3.1)
Lymphs: 24 %
MCH: 26.9 pg (ref 26.6–33.0)
MCHC: 32.9 g/dL (ref 31.5–35.7)
MCV: 82 fL (ref 79–97)
MONOS ABS: 0.5 10*3/uL (ref 0.1–0.9)
Monocytes: 5 %
NEUTROS ABS: 7.4 10*3/uL — AB (ref 1.4–7.0)
NEUTROS PCT: 71 %
PLATELETS: 272 10*3/uL (ref 150–379)
RBC: 4.61 x10E6/uL (ref 3.77–5.28)
RDW: 15.1 % (ref 12.3–15.4)
WBC: 10.4 10*3/uL (ref 3.4–10.8)

## 2017-01-31 LAB — TSH: TSH: 3.96 u[IU]/mL (ref 0.450–4.500)

## 2017-02-13 DIAGNOSIS — F900 Attention-deficit hyperactivity disorder, predominantly inattentive type: Secondary | ICD-10-CM | POA: Diagnosis not present

## 2017-02-13 DIAGNOSIS — F330 Major depressive disorder, recurrent, mild: Secondary | ICD-10-CM | POA: Diagnosis not present

## 2017-02-13 DIAGNOSIS — Z79899 Other long term (current) drug therapy: Secondary | ICD-10-CM | POA: Diagnosis not present

## 2017-02-13 DIAGNOSIS — F411 Generalized anxiety disorder: Secondary | ICD-10-CM | POA: Diagnosis not present

## 2017-02-22 ENCOUNTER — Ambulatory Visit: Payer: Medicare Other | Admitting: Physical Therapy

## 2017-02-22 ENCOUNTER — Ambulatory Visit: Payer: Self-pay | Admitting: Family Medicine

## 2017-03-01 ENCOUNTER — Ambulatory Visit (INDEPENDENT_AMBULATORY_CARE_PROVIDER_SITE_OTHER): Payer: Medicare Other | Admitting: Family Medicine

## 2017-03-01 ENCOUNTER — Encounter: Payer: Self-pay | Admitting: Family Medicine

## 2017-03-01 VITALS — BP 119/75 | HR 89 | Temp 98.4°F | Wt 188.0 lb

## 2017-03-01 DIAGNOSIS — M9903 Segmental and somatic dysfunction of lumbar region: Secondary | ICD-10-CM

## 2017-03-01 DIAGNOSIS — M9905 Segmental and somatic dysfunction of pelvic region: Secondary | ICD-10-CM | POA: Diagnosis not present

## 2017-03-01 DIAGNOSIS — M9901 Segmental and somatic dysfunction of cervical region: Secondary | ICD-10-CM

## 2017-03-01 DIAGNOSIS — M9902 Segmental and somatic dysfunction of thoracic region: Secondary | ICD-10-CM

## 2017-03-01 DIAGNOSIS — G8929 Other chronic pain: Secondary | ICD-10-CM

## 2017-03-01 DIAGNOSIS — M9909 Segmental and somatic dysfunction of abdomen and other regions: Secondary | ICD-10-CM | POA: Diagnosis not present

## 2017-03-01 DIAGNOSIS — R102 Pelvic and perineal pain unspecified side: Secondary | ICD-10-CM

## 2017-03-01 DIAGNOSIS — M9904 Segmental and somatic dysfunction of sacral region: Secondary | ICD-10-CM | POA: Diagnosis not present

## 2017-03-01 DIAGNOSIS — R1011 Right upper quadrant pain: Secondary | ICD-10-CM | POA: Diagnosis not present

## 2017-03-01 DIAGNOSIS — M9900 Segmental and somatic dysfunction of head region: Secondary | ICD-10-CM

## 2017-03-01 DIAGNOSIS — M9908 Segmental and somatic dysfunction of rib cage: Secondary | ICD-10-CM

## 2017-03-01 MED ORDER — OXYCODONE-ACETAMINOPHEN 5-325 MG PO TABS: 1.0000 | ORAL_TABLET | Freq: Four times a day (QID) | ORAL | 0 refills | Status: DC | PRN

## 2017-03-01 NOTE — Assessment & Plan Note (Signed)
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  Also seems to be due to weak core. Discussed need to strengthen this, and she states that she cannot. Did better with PT, but had finished that now and has not continued with her exercises. Have generated a referral to that, but she has not gone as she states that she feels too tired. Points to her pubic symphysis as the primary location of her pain. States that she limps when she walks. 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 Asymmetric facet arthropathy of L4-L5, MRI of lumbar was last done in 2009- but going to be repeated by pain management. MRI of pelvis was done and was normal. Had lumbar facet block done about 6 weeks ago. Not sure if it helped. She does have some somatic dysfunctions that I think are contributing to her symptoms. I think she would benefit from OMT. Patient treated today with fair results. Continue to monitor and recheck in as needed.

## 2017-03-01 NOTE — Progress Notes (Signed)
BP 119/75 (BP Location: Left Arm, Patient Position: Sitting, Cuff Size: Normal)   Pulse 89   Temp 98.4 F (36.9 C)   Wt 188 lb (85.3 kg)   SpO2 97%   BMI 35.52 kg/m    Subjective:    Patient ID: Sharon Rodriguez, female    DOB: 06/01/1980, 36 y.o.   MRN: [id number]  HPI: Sharon Rodriguez is a 36 y.o. female  Chief Complaint  Patient presents with  . Pain   Has been off of her xanax for about a month. Saw her psychiatrist who started her on an anti-depressant, but she hasn't started it yet. She doesn't know what it was. She would like to get back into pain management, but is very concerned about people touching her medication to do pill counts.   CHRONIC PAIN  Present dose: 62.5 Morphine equivalents Pain control status: stable Duration: chronic Location: wide spread  Quality: tight and throbbing and aching Current Pain Level: mild Previous Pain Level: severe Breakthrough pain: yes Benefit from narcotic medications: yes What Activities task can be accomplished with current medication? Interested in weaning off narcotics:no   Stool softners/OTC fiber: no  Previous pain specialty evaluation: yes Non-narcotic analgesic meds: no Narcotic contract: yes  ABDOMINAL PAIN  Duration:weeks Onset: gradual Severity: moderate Quality: cramping Location:  RUQ Episode duration: hours  Radiation: into her back Frequency: intermittent- with eating especially greasy food Alleviating factors: nothing Aggravating factors: eating greasy food Status: fluctuating Treatments attempted: antacids Fever: no Nausea: yes Vomiting: no Weight loss: no Decreased appetite: no Diarrhea: no Constipation: no Blood in stool: no Heartburn: yes Jaundice: no Rash: no Dysuria/urinary frequency: no Hematuria: no History of sexually transmitted disease: no Recurrent NSAID use: no  Had a fibromyalgia flare. Pelvis and back hurt. She notes that she would be better if she did PT, but hasn't gone  because she has been in pain. Pain is in her low back and pelvis. Better with medicine and OMT and PT. Worse with walking and staying in 1 position for too long. Pain radiates into her back. This is chronic and about stable. No other concerns or complaints at this time.   Relevant past medical, surgical, family and social history reviewed and updated as indicated. Interim medical history since our last visit reviewed. Allergies and medications reviewed and updated.  Review of Systems  Constitutional: Negative.   Respiratory: Negative.   Cardiovascular: Negative.   Gastrointestinal: Positive for abdominal distention, abdominal pain and nausea. Negative for anal bleeding, blood in stool, constipation, diarrhea, rectal pain and vomiting.  Genitourinary: Negative.   Musculoskeletal: Positive for arthralgias, back pain, joint swelling and myalgias. Negative for gait problem, neck pain and neck stiffness.  Skin: Negative.   Neurological: Negative.   Hematological: Negative.   Psychiatric/Behavioral: Negative.     Per HPI unless specifically indicated above     Objective:    BP 119/75 (BP Location: Left Arm, Patient Position: Sitting, Cuff Size: Normal)   Pulse 89   Temp 98.4 F (36.9 C)   Wt 188 lb (85.3 kg)   SpO2 97%   BMI 35.52 kg/m   Wt Readings from Last 3 Encounters:  03/01/17 188 lb (85.3 kg)  01/30/17 180 lb 7 oz (81.8 kg)  01/02/17 180 lb 5 oz (81.8 kg)    Physical Exam  Constitutional: She is oriented to person, place, and time. She appears well-developed and well-nourished. No distress.  HENT:  Head: Normocephalic and atraumatic.  Right Ear: Hearing normal.  Left Ear: Hearing normal.  Nose: Nose normal.  Eyes: Conjunctivae and lids are normal. Right eye exhibits no discharge. Left eye exhibits no discharge. No scleral icterus.  Cardiovascular: Normal rate, regular rhythm, normal heart sounds and intact distal pulses. Exam reveals no gallop and no friction rub.  No  murmur heard. Pulmonary/Chest: Effort normal and breath sounds normal. No respiratory distress. She has no wheezes. She has no rales. She exhibits no tenderness.  Abdominal: Soft. Bowel sounds are normal. She exhibits no distension and no mass. There is tenderness in the right upper quadrant. There is positive Murphy's sign. There is no rebound and no guarding.  Musculoskeletal: She exhibits tenderness.  Neurological: She is alert and oriented to person, place, and time.  Skin: Skin is warm, dry and intact. No rash noted. She is not diaphoretic. No erythema. No pallor.  Psychiatric: She has a normal mood and affect. Her speech is normal and behavior is normal. Judgment and thought content normal. Cognition and memory are normal.  Nursing note and vitals reviewed. Musculoskeletal:  Exam found Decreased ROM, Tissue texture changes, Tenderness to palpation and Asymmetry of patient's  head, neck, thorax, ribs, lumbar, pelvis, sacrum and abdomen Osteopathic Structural Exam:   Head: hypertonic suboccipital muscles, OAESSR  Neck: trap spasm on the R, C3ESRR, C4ESRL  Thorax: T3-5SLRR, T5-7SRRL  Ribs: Ribs 4-8 locked up on the R, Ribs 6-9 locked up on the L  Lumbar: QL hypertonic on the L, L3-5SRL  Pelvis: Posterior L innominate  Sacrum: L on L torsion  Abdomen: diaphragm spasm bilaterally L>R   Results for orders placed or performed in visit on 01/30/17  CBC with Differential/Platelet  Result Value Ref Range   WBC 10.4 3.4 - 10.8 x10E3/uL   RBC 4.61 3.77 - 5.28 x10E6/uL   Hemoglobin 12.4 11.1 - 15.9 g/dL   Hematocrit 37.7 34.0 - 46.6 %   MCV 82 79 - 97 fL   MCH 26.9 26.6 - 33.0 pg   MCHC 32.9 31.5 - 35.7 g/dL   RDW 15.1 12.3 - 15.4 %   Platelets 272 150 - 379 x10E3/uL   Neutrophils 71 Not Estab. %   Lymphs 24 Not Estab. %   Monocytes 5 Not Estab. %   Eos 0 Not Estab. %   Basos 0 Not Estab. %   Neutrophils Absolute 7.4 (H) 1.4 - 7.0 x10E3/uL   Lymphocytes Absolute 2.5 0.7 - 3.1 x10E3/uL     Monocytes Absolute 0.5 0.1 - 0.9 x10E3/uL   EOS (ABSOLUTE) 0.0 0.0 - 0.4 x10E3/uL   Basophils Absolute 0.0 0.0 - 0.2 x10E3/uL   Immature Granulocytes 0 Not Estab. %   Immature Grans (Abs) 0.0 0.0 - 0.1 x10E3/uL  TSH  Result Value Ref Range   TSH 3.960 0.450 - 4.500 uIU/mL      Assessment & Plan:   Problem List Items Addressed This Visit      Other   Chronic pain    Now off her xanax. Will get her back into pain clinic. Appointment scheduled. Discussed that no matter where she goes they will have to do pill counts, including staying with [place]. Refill of her medicine given today. Recheck 1 month if she has not had her meds taken over by pain yet.       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  Also seems to be due to weak core. Discussed need to strengthen this, and she states that she cannot. Did better with PT, but had finished that now and has not continued with her exercises. Have generated a referral to that, but she has not gone as she states that she feels too tired. Points to her pubic symphysis as the primary location of her pain. States that she limps when she walks. 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 Asymmetric facet arthropathy of L4-L5, MRI of lumbar was last done in 2009- but going to be repeated by pain management. MRI of pelvis was done and was normal. Had lumbar facet block done about 6 weeks ago. Not sure if it helped. She does have some somatic dysfunctions that I think are contributing to her symptoms. I think she would benefit from OMT. Patient treated today with fair results. Continue to monitor and recheck in as needed.      Relevant Medications   oxyCODONE-acetaminophen (PERCOCET) 5-325  MG tablet    Other Visit Diagnoses    RUQ pain    -  Primary   Concern for gall bladder issues. Will obtain RUQ ultrasound. Await results.    Relevant Orders   US Abdomen Limited RUQ   Sacral region somatic dysfunction       Pelvic region somatic dysfunction       Somatic dysfunction of spine, lumbar       Thoracic region somatic dysfunction       Segmental dysfunction of abdomen       Head region somatic dysfunction       Rib cage region somatic dysfunction       Somatic dysfunction of cervical region         After verbal consent was obtained, patient was treated today with osteopathic manipulative medicine to the regions of the head, neck, thorax, ribs, lumbar, pelvis, sacrum and abdomen using the techniques of cranial, myofascial release, counterstrain, muscle energy, HVLA and soft tissue. Areas of compensation relating to her primary pain source also treated. Patient tolerated the procedure well with good objective and good subjective improvement in symptoms. She left the room in good condition. She was advised to stay well hydrated and that she may have some soreness following the procedure. If not improving or worsening, she will call and come in. She will return for reevaluation  in 1-2 months.  Follow up plan: Return in about 4 weeks (around 03/29/2017) for for procedure visit.

## 2017-03-01 NOTE — Assessment & Plan Note (Signed)
Now off her xanax. Will get her back into pain clinic. Appointment scheduled. Discussed that no matter where she goes they will have to do pill counts, including staying with [place]. Refill of her medicine given today. Recheck 1 month if she has not had her meds taken over by pain yet.

## 2017-03-01 NOTE — Patient Instructions (Signed)
[facility] 03/27/17 10:15 AM

## 2017-03-13 DIAGNOSIS — Z8711 Personal history of peptic ulcer disease: Secondary | ICD-10-CM

## 2017-03-13 HISTORY — DX: Personal history of peptic ulcer disease: Z87.11

## 2017-03-15 ENCOUNTER — Ambulatory Visit: Payer: Medicare Other

## 2017-03-19 ENCOUNTER — Ambulatory Visit: Payer: Medicare Other | Attending: Family Medicine | Admitting: Physical Therapy

## 2017-03-19 DIAGNOSIS — R2689 Other abnormalities of gait and mobility: Secondary | ICD-10-CM | POA: Insufficient documentation

## 2017-03-19 DIAGNOSIS — M791 Myalgia, unspecified site: Secondary | ICD-10-CM | POA: Diagnosis not present

## 2017-03-19 NOTE — Patient Instructions (Signed)
  Clam Shell 45 Degrees   Lying on R side  with hips and knees bent 45, one pillow between knees and ankles. Lift L  knee with exhale. Be sure pelvis does not roll backward. Do not arch back. Do 10 x 2  times, each leg, 2 times per day.    http://ss.exer.us/75   Copyright  [employer]. All rights reserved.   ________ Ankle eversion strengthening   The band on [place] of foot with the blue band  Raise toes up and wipe out   10 x 2 x 2 day

## 2017-03-21 NOTE — Therapy (Addendum)
[place] MAIN [facility] SERVICES [address] [place], [place], [address] Phone: [phone]   Fax:  [phone]  Physical Therapy Evaluation  Patient Details  Name: Sharon Rodriguez MRN: [id number] Date of Birth: 1980/04/30 No Data Recorded  Encounter Date: 03/19/2017  PT End of Session - 03/21/17 0026    Visit Number  1    Number of Visits  12    Date for PT Re-Evaluation  06/11/17    PT Start Time  1300    PT Stop Time  1410    PT Time Calculation (min)  70 min       Past Medical History:  Diagnosis Date  . ADD (attention deficit disorder)   . Anxiety   . Chronic bronchitis ([place])   . Chronic fatigue   . Chronic pain   . Depression   . Fibromyalgia   . Hypothyroidism 01/27/2015  . Learning disability   . Osteoarthritis   . Rheumatoid arthritis ([place])   . Sleep apnea   . Thyroid disease   . TMJ (dislocation of temporomandibular joint)     Past Surgical History:  Procedure Laterality Date  . CESAREAN SECTION     2    There were no vitals filed for this visit.   Subjective Assessment - 03/21/17 0037    Subjective 1)  R sided pelvic pain: Pt reports she has had R sided pelvic pain off and on for the past 2 years which is getting worse. The pain is located in the front of her pubic bone. It  comes on after she walking, lifting, moving something 10/10. Pt is having pain while sitting which is why she thinks it is getting worse.  Pain is non radiating and lasts for days. She has had an [place] which showed negative findings.   2) R upper abdominal pain: Pt has had pain in this area for the past 3 weeks. Pt feels this pain more when she eats food. Pt will be getting an [place] to check for gallbladder issues.       3) urinary frequency and leakage: Pt has been using the bathroom 4x within 2hours and 3x at night.  Pt has had leakage and also clear discharge.  Denied pain with urination. It has been hard to stop the flow.    3) menstrual pain:  Her periods have become more heavy for the past 6 cycles. She has pain across her low abdomen when on her cycle. Her cramps keep her in her bed.  Pt has not had a gynecological exam for the past 2 years. Pt is expecting her next cycle on the 20th of Jan.  Pt has not told her PCP about her menstrual pains.      4) LBP near her butt which comes and goes. This pain started 4-5 years before her pelvis problems.  Pain occurs after standing for 5 min.  10/10 and pain eases with sitting and stretches 4/10. Throbbing and tingling occurs localized in her buttock area.     Pertinent History  fibromyalgia, chronic fatigue with recent flare-ups, 3 falls within the past 6 months, depression     Patient Stated Goals  Pt would like to be able to walk all day without it hurting her.          [facility] PT Assessment - 03/21/17 0038      Observation/Other Assessments   Observations  WFL limits (6 spinal motions without pain)  Single Leg Stance   Comments  R SLS,  L pelvic drop       Strength   Overall Strength Comments  hip abd L 4-/5 with R pubic pain initially, no pain post Tx,   hip abd R 4+/5   . ankle eversion B 4/5       Palpation   SI assessment   SIJ symmetrical, no limitations     Palpation comment  tenderness at pubic symphysis ( no misalignment),  mons pubis / low abdominal area is non-soft, abdominal scar with mild restrictions but not reproducing the pain. Tenderness at adductor mm        Ambulation/Gait   Gait Comments  pronation             Objective measurements completed on examination: See above findings.      [facility] [facility] PT Treatment/Exercise - 03/21/17 0038      Exercises   Exercises  -- see pt instructions       Manual Therapy   Manual therapy comments  long axis distraction BLE, STM at adductor R, ischiocavernosus B                  PT Long Term Goals - 03/19/17 1327      PT LONG TERM GOAL #1   Title  Pt will decrease her Geiger score from 83% in  order to improve QOL    Time  12    Period  Weeks    Status  New    Target Date  06/11/17      PT LONG TERM GOAL #2   Title  Pt will demo less pronation bilaterally in gait in order to minimize increased tensions at adductor/ groin/ pubic area and decrease pain with walking    Time  8    Period  Weeks    Status  New    Target Date  06/11/17      PT LONG TERM GOAL #3   Title  Pt will demo IND with HEP     Time  4    Period  Weeks    Status  New    Target Date  06/11/17      PT LONG TERM GOAL #4   Period  Weeks      PT LONG TERM GOAL #5   Period  Weeks    Status  New      PT LONG TERM GOAL #6   Time  12    Status  New             Plan - 03/21/17 0027    Clinical Impression Statement  Pt is a 37 yo female who reports a cluster of pelvic symptoms:          _R upper abdominal pain for the past 3 weeks         _heavier menses for the past 6 cycles with cramps and pain that kept her bedbound         _R sided pelvic pain that have occurred intermittently throughout the past 2 years         _increased urinary frequency, urgency, nocturia         _intermittent localized CLBP at buttock area for the past 4-5 years    Pt was referred to pelvic health by her PCP for her pelvic pain but through her interview, these additional Sx were communicated today. Pt will be undergoing [place] to rule in/out gallstones in association  with her R upper abdominal pain. Pt has been referred to a gynecologist for the following reasons:          _ pt has not had a gynecological exam for the past 2 years and pt has not reported gynecological issues  to her PCP prior to PT referral           _ pt reports the pain is located in the front of her pubic bone. It comes on after she walking, lifting, moving something 10/10. Pt is having now having pain while sitting which is why she thinks it is getting worse.  Pain is non radiating and lasts for days. She has had an [place] which showed negative findings.            _findings from assessment today showed severe tenderness with palpation to the B pubic symphysis, suprapubic area, low abdominal area, adductors. B Low abdominal area was non-soft.           _ additional findings: Abdominal scar with mild restrictions but not reproducing the pain.  Pt showed good carry over from the pelvic PT she received 2 years ago as she showed no more pelvic obliquities. Today 's assessment showed strength deficits at the L hip and lower kinetic chain deficits such as pronation with gait. Reproduction of R pubic pain with L hip extension and abduction initially. Following Tx, pubic pain was no longer associated with this movement and pt reported less pain.          Despite the changes made in today's session, PT suspects there may be a medical component to pt's case. Pt has additional co-morbidities: fibromyalgia, OA, RA, depression, anxiety, thyroid disease.  Pt reports chronic fatigue flare-ups have been increasing. She also reported 3 falls within the past 6 months ( stepping off a curb,coming off a medication, walking) but did not show LOB during visit.  PT would like to ensure pt undergoes a medical work up. PT plans to communicate with her PCP and gynecologist and gain clearance for continuation of PT.            Clinical Presentation  Evolving    Clinical Decision Making  Moderate    PT Frequency  1x / week    PT Duration  12 weeks    PT Treatment/Interventions  Gait training;Moist Heat;Neuromuscular re-education;Manual techniques;Therapeutic exercise;Therapeutic activities;Electrical Stimulation;Aquatic Therapy;Scar mobilization;Energy conservation;Patient/family education;Stair training    Consulted and Agree with Plan of Care  Patient       Patient will benefit from skilled therapeutic intervention in order to improve the following deficits and impairments:  Pain, Increased fascial restricitons, Increased muscle spasms, Postural dysfunction, Decreased mobility,  Decreased coordination, Decreased endurance, Decreased activity tolerance, Decreased range of motion, Decreased strength, Difficulty walking, Decreased safety awareness, Decreased balance, Improper body mechanics  Visit Diagnosis: Other abnormalities of gait and mobility  Myalgia     Problem List Patient Active Problem List   Diagnosis Date Noted  . Controlled substance agreement signed 10/03/2016  . Vitamin D deficiency 07/06/2016  . Lower limb length difference 02/27/2015  . Hypothyroidism 01/27/2015  . Chronic pelvic pain in female 01/27/2015  . Anxiety   . Rheumatoid arthritis ([place])   . Depression   . Fibromyalgia   . TMJ pain dysfunction syndrome   . Chronic bronchitis ([place])   . Chronic fatigue   . Chronic pain   . ADD (attention deficit disorder)   . Learning disability     Jerl Mina ,PT,  DPT, E-RYT  03/21/2017, 12:39 AM  [place] MAIN [facility] SERVICES [address] [place], [place], [address] Phone: [phone]   Fax:  [phone]  Name: Sharon Rodriguez MRN: [id number] Date of Birth: [birth year]

## 2017-03-22 ENCOUNTER — Ambulatory Visit
Admission: RE | Admit: 2017-03-22 | Discharge: 2017-03-22 | Disposition: A | Discharge status: Home / Self Care | Payer: Medicare Other | Source: Ambulatory Visit | Attending: Family Medicine | Admitting: Family Medicine

## 2017-03-22 ENCOUNTER — Telehealth: Payer: Self-pay | Admitting: Family Medicine

## 2017-03-22 DIAGNOSIS — R1011 Right upper quadrant pain: Secondary | ICD-10-CM | POA: Diagnosis not present

## 2017-03-22 DIAGNOSIS — K760 Fatty (change of) liver, not elsewhere classified: Secondary | ICD-10-CM | POA: Diagnosis not present

## 2017-03-22 DIAGNOSIS — G8929 Other chronic pain: Secondary | ICD-10-CM

## 2017-03-22 NOTE — Telephone Encounter (Signed)
Please let her know that her [place] came back normal except for some fatty liver. No sign of anything wrong with her gall bladder. If it's not getting better, please let me know. Thanks!

## 2017-03-22 NOTE — Telephone Encounter (Signed)
Since no better, will refer to GI

## 2017-03-22 NOTE — Telephone Encounter (Signed)
Relayed to patient. Pt states she is the same, no improvement, no change.

## 2017-03-22 NOTE — Telephone Encounter (Signed)
LVM for patient to return phone call.  

## 2017-03-26 ENCOUNTER — Encounter: Payer: Medicare Other | Admitting: Physical Therapy

## 2017-03-27 ENCOUNTER — Ambulatory Visit: Payer: Medicare Other | Admitting: Student in an Organized Health Care Education/Training Program

## 2017-03-27 NOTE — Addendum Note (Signed)
Addended by: Jerl Mina on: 03/27/2017 11:24 AM   Modules accepted: Orders

## 2017-03-28 ENCOUNTER — Encounter: Payer: Self-pay | Admitting: Obstetrics and Gynecology

## 2017-03-28 ENCOUNTER — Ambulatory Visit (INDEPENDENT_AMBULATORY_CARE_PROVIDER_SITE_OTHER): Payer: Medicare Other | Admitting: Obstetrics and Gynecology

## 2017-03-28 ENCOUNTER — Other Ambulatory Visit (INDEPENDENT_AMBULATORY_CARE_PROVIDER_SITE_OTHER): Payer: Medicare Other

## 2017-03-28 VITALS — BP 113/74 | HR 92 | Ht 61.0 in | Wt 191.6 lb

## 2017-03-28 DIAGNOSIS — Z124 Encounter for screening for malignant neoplasm of cervix: Secondary | ICD-10-CM | POA: Diagnosis not present

## 2017-03-28 DIAGNOSIS — R102 Pelvic and perineal pain: Secondary | ICD-10-CM | POA: Diagnosis not present

## 2017-03-28 DIAGNOSIS — R1013 Epigastric pain: Secondary | ICD-10-CM | POA: Diagnosis not present

## 2017-03-28 DIAGNOSIS — N938 Other specified abnormal uterine and vaginal bleeding: Secondary | ICD-10-CM | POA: Diagnosis not present

## 2017-03-28 DIAGNOSIS — M797 Fibromyalgia: Secondary | ICD-10-CM | POA: Diagnosis not present

## 2017-03-28 DIAGNOSIS — N644 Mastodynia: Secondary | ICD-10-CM | POA: Diagnosis not present

## 2017-03-28 MED ORDER — FLUCONAZOLE 150 MG PO TABS: 150.0000 mg | ORAL_TABLET | Freq: Once | ORAL | 3 refills | Status: DC

## 2017-03-28 MED ORDER — FLUCONAZOLE 150 MG PO TABS: 150.0000 mg | ORAL_TABLET | Freq: Once | ORAL | 3 refills | Status: AC

## 2017-03-28 NOTE — Patient Instructions (Signed)
Dysfunctional Uterine Bleeding °Dysfunctional uterine bleeding is abnormal bleeding from the uterus. Dysfunctional uterine bleeding includes: °· A period that comes earlier or later than usual. °· A period that is lighter, heavier, or has blood clots. °· Bleeding between periods. °· Skipping one or more periods. °· Bleeding after sexual intercourse. °· Bleeding after menopause. ° °Follow these instructions at home: °Pay attention to any changes in your symptoms. Follow these instructions to help with your condition: °Eating and drinking °· Eat well-balanced meals. Include foods that are high in iron, such as liver, meat, shellfish, green leafy vegetables, and eggs. °· If you become constipated: °? Drink plenty of water. °? Eat fruits and vegetables that are high in water and fiber, such as spinach, carrots, raspberries, apples, and mango. °Medicines °· Take over-the-counter and prescription medicines only as told by your health care provider. °· Do not change medicines without talking with your health care provider. °· Aspirin or medicines that contain aspirin may make the bleeding worse. Do not take those medicines: °? During the week before your period. °? During your period. °· If you were prescribed iron pills, take them as told by your health care provider. Iron pills help to replace iron that your body loses because of this condition. °Activity °· If you need to change your sanitary pad or tampon more than one time every 2 hours: °? Lie in bed with your feet raised (elevated). °? Place a cold pack on your lower abdomen. °? Rest as much as possible until the bleeding stops or slows down. °· Do not try to lose weight until the bleeding has stopped and your blood iron level is back to normal. °Other Instructions °· For two months, write down: °? When your period starts. °? When your period ends. °? When any abnormal bleeding occurs. °? What problems you notice. °· Keep all follow up visits as told by your health  care provider. This is important. °Contact a health care provider if: °· You get light-headed or weak. °· You have nausea and vomiting. °· You cannot eat or drink without vomiting. °· You feel dizzy or have diarrhea while you are taking medicines. °· You are taking birth control pills or hormones, and you want to change them or stop taking them. °Get help right away if: °· You develop a fever or chills. °· You need to change your sanitary pad or tampon more than one time per hour. °· Your bleeding becomes heavier, or your flow contains clots more often. °· You develop pain in your abdomen. °· You lose consciousness. °· You develop a rash. °This information is not intended to replace advice given to you by your health care provider. Make sure you discuss any questions you have with your health care provider. °Document Released: 02/25/2000 Document Revised: 08/05/2015 Document Reviewed: 05/25/2014 °[employer]vier Interactive Patient Education © 2018 [employer] ° °

## 2017-03-28 NOTE — Progress Notes (Signed)
GYNECOLOGY CLINIC PROGRESS NOTE  Subjective:   Patient is a 37 y.o. G26P2002 female who presents as a referral for irregular bleeding.  Onset of symptoms was gradual starting 3-4 months ago with gradually worsening course since that time. Bleeding is characterized as heavy, sometimes having to change a pad every 20 minutes, dark red or brown. Length of bleeding: 7 days.  Associated symptoms include bloating, moodiness and pelvic pain and fatigue.  Evaluation to date: lab: CBC with diff and TSH. Treatment to date: none. Does report a h/o chronic yeast infections.  Notes that the pelvic pain has actually been ongoing for several years.  She is currently utilizing pelvic floor physical therapy, but notes that it only helps some with pain relief. States that she has been told in the past that her pain may be due to inflammation or scar tissue.   On further discussion, patient now divulges that the pain is mostly at pubic symphysis and near hips. Notes that this pain began ~ 6 years ago and has been ongoing ever since.  Pain has caused some difficulty walking and leg weakness. She had a pelvic MRI in the past that did not show anything.  States that this has been more concerning to her than the bleeding.    Menstrual History: OB History    Gravida Para Term Preterm AB Living   2 2 2     2    SAB TAB Ectopic Multiple Live Births           2      Menarche age: 51 Patient's last menstrual period was 03/01/2017.  Last pap smear: "several years ago".  Results were normal per patient.  Denies prior h/o abnormal pap smears.    Past Medical History:  Diagnosis Date  . ADD (attention deficit disorder)   . Anxiety   . Chronic bronchitis ([place])   . Chronic fatigue   . Chronic pain   . Depression   . Fibromyalgia   . Hypothyroidism 01/27/2015  . Learning disability   . Osteoarthritis   . Rheumatoid arthritis ([place])   . Sleep apnea   . Thyroid disease   . TMJ (dislocation of temporomandibular  joint)     . Family History  Problem Relation Age of Onset  . Diabetes Mother   . ADD / ADHD Mother   . Learning disabilities Mother   . COPD Mother   . Diabetes Father   . Hyperlipidemia Father   . Mental illness Father        Depression/anxiety  . Mental illness Sister        Depression/Anxiety  . Arthritis Brother        Rheumatoid  . Mental illness Daughter        anxiety  . Thyroid disease Daughter   . ADD / ADHD Daughter   . Allergies Daughter   . Cancer Maternal Grandmother        Skin, Lung  . Heart disease Maternal Grandfather   . Stroke Maternal Grandfather   . Heart attack Maternal Grandfather   . Arthritis Paternal Grandmother        Rheumatoid  . Dementia Paternal Grandmother   . Allergies Daughter     Past Surgical History:  Procedure Laterality Date  . CESAREAN SECTION     2    Social History   Socioeconomic History  . Marital status: Married    Spouse name: Not on file  . Number of children: Not on file  .  Years of education: Not on file  . Highest education level: Not on file  Social Needs  . Financial resource strain: Not on file  . Food insecurity - worry: Not on file  . Food insecurity - inability: Not on file  . Transportation needs - medical: Not on file  . Transportation needs - non-medical: Not on file  Occupational History  . Not on file  Tobacco Use  . Smoking status: Never Smoker  . Smokeless tobacco: Never Used  Substance and Sexual Activity  . Alcohol use: No  . Drug use: No  . Sexual activity: Not Currently    Birth control/protection: None  Other Topics Concern  . Not on file  Social History Narrative  . Not on file    Current Outpatient Medications on File Prior to Visit  Medication Sig Dispense Refill  . albuterol (PROVENTIL) (2.5 MG/3ML) 0.083% nebulizer solution Frequency:Q4HPRN   Dosage:0.0     Instructions:  Note:Dose: 1  Dx:J42 75 mL 12  . levothyroxine (SYNTHROID, LEVOTHROID) 50 MCG tablet Take 1 tablet  (50 mcg total) by mouth daily before breakfast. NAME BRAND ONLY PLEASE 90 tablet 3  . naproxen (NAPROSYN) 500 MG tablet Take 1 tablet (500 mg total) by mouth 3 (three) times daily with meals. 270 tablet 1  . oxyCODONE-acetaminophen (PERCOCET) 5-325 MG tablet Take 1-2 tablets by mouth every 6 (six) hours as needed for severe pain. 150 tablet 0  . Vitamin D, Ergocalciferol, (DRISDOL) 50000 units CAPS capsule Take 1 capsule (50,000 Units total) by mouth every 7 (seven) days. 16 capsule 0  . amphetamine-dextroamphetamine (ADDERALL) 30 MG tablet Take 30 mg by mouth daily.  0   No current facility-administered medications on file prior to visit.     Allergies  Allergen Reactions  . Ibuprofen Diarrhea and Nausea Only    Other Reaction: GI Upset  . Promethazine Rash     Review of Systems Constitutional: negative for chills, fatigue, fevers and sweats Eyes: negative for irritation, redness and visual disturbance Ears, nose, mouth, throat, and face: negative for hearing loss, nasal congestion, snoring and tinnitus Respiratory: negative for asthma, cough, sputum Cardiovascular: negative for chest pain, dyspnea, exertional chest pressure/discomfort, irregular heart beat, palpitations and syncope Gastrointestinal: + for upper GI bloating/swelling of upper abdomen, with discomfort/pain, nausea.  Due to see GI tomorrow. Has had uper abdomen ultrasound. Negative for change in bowel habits, and vomiting Genitourinary: negative for abnormal menstrual periods, genital lesions, sexual problems and vaginal discharge, dysuria and urinary incontinence Integument/breast: + Breast tenderness. Negative for breast lump,and nipple discharge Hematologic/lymphatic: negative for bleeding and easy bruising Musculoskeletal:negative for back pain and muscle weakness Neurological: negative for dizziness, headaches, vertigo and weakness Endocrine: negative for diabetic symptoms including polydipsia, polyuria and skin  dryness Allergic/Immunologic: negative for hay fever and urticaria      Objective:    BP 113/74 (BP Location: Left Arm, Patient Position: Sitting, Cuff Size: Normal)   Pulse 92   Ht 5\' 1"  (1.549 m)   Wt 191 lb 9.6 oz (86.9 kg)   LMP 03/01/2017   BMI 36.20 kg/m  BP 113/74 (BP Location: Left Arm, Patient Position: Sitting, Cuff Size: Normal)   Pulse 92   Ht 5\' 1"  (1.549 m)   Wt 191 lb 9.6 oz (86.9 kg)   LMP 03/01/2017   BMI 36.20 kg/m  General appearance: alert, cooperative and no distress Neck: no adenopathy, no carotid bruit, no JVD, supple, symmetrical, trachea midline and thyroid not enlarged, symmetric, no tenderness/mass/nodules  Lungs: clear to auscultation bilaterally Breasts: normal appearance, no masses or tenderness Heart: regular rate and rhythm, S1, S2 normal, no murmur, click, rub or gallop Abdomen: normal findings: bowel sounds normal, no masses palpable, no organomegaly and soft and abnormal findings:  mild tenderness in the epigastrium and in the lower abdomen Pelvic: external genitalia normal, rectovaginal septum normal.  Vagina with scant thin white discharge.  Unable to visualize cervix, barely palpable on bimanual exam.   Uterus mobile, nontender, feels slightly enlarged ~ 12-14 week sized but difficult to assess due to body habitus.  Adnexae non-palpable, nontender bilaterally.  Extremities: extremities normal, atraumatic, no cyanosis or edema Skin: Skin color, texture, turgor normal. No rashes or lesions Neurologic: Grossly normal      Labs:  Lab Results  Component Value Date   WBC 10.4 01/30/2017   HGB 12.4 01/30/2017   HCT 37.7 01/30/2017   MCV 82 01/30/2017   PLT 272 01/30/2017    Lab Results  Component Value Date   TSH 3.960 01/30/2017     Imaging:  CLINICAL DATA:  Pubic pain for 6 years with leg weakness and difficulty walking. No known injury.  EXAM: MRI PELVIS WITHOUT AND WITH CONTRAST (performed 01/07/2017)  TECHNIQUE: Multiplanar  multisequence MR imaging of the pelvis was performed both before and after administration of intravenous contrast.  CONTRAST:  10mL MULTIHANCE GADOBENATE DIMEGLUMINE 529 MG/ML IV SOLN  COMPARISON:  CT abdomen pelvis dated Jul 16, 2007.  FINDINGS: Bones: There is no evidence of acute fracture, dislocation or avascular necrosis. The visualized bony pelvis appears normal. The visualized sacroiliac joints and symphysis pubis appear normal.  Articular cartilage and labrum  Articular cartilage: No focal chondral defect or subchondral signal abnormality identified.  Labrum: There is no gross labral tear or paralabral abnormality.  Joint or bursal effusion  Joint effusion: No significant hip joint effusion.  Bursae: No focal periarticular fluid collection.  Muscles and tendons  Muscles and tendons: The visualized gluteus, hamstring and iliopsoas tendons appear normal. The piriformis muscles appear symmetric.  Other findings  Miscellaneous: The visualized internal pelvic contents appear unremarkable. The appearance of the uterus is unchanged dating back to 2009.  No abnormal enhancement.  IMPRESSION: 1. Normal MRI of the pelvis with and without contrast.   Electronically Signed   By: Titus Dubin M.D.   On: 12/16/2016 11:07    ULTRASOUND REPORT  Location: [facility] Date of Service: 03/28/2017   Indications: DUB Findings:  Very limited transvaginal exam due to uterine size and inability to penetrate.  The most optimal images were obtained transabdominally.  The uterus measures 12.1 x 6.0 x 5.0 cm. Echo texture is homogeneous without evidence of focal masses.  The Endometrium measures 5.8 mm.  Right Ovary measures 2.3 x 1.8 x 1.8 cm and appears WNL.  Left Ovary measures 2.3 x 1.6 x 1.6 cm and appears WNL.  Survey of the adnexa demonstrates no adnexal masses. There is no free fluid in the cul de sac.  Impression: 1.  Anteverted uterus appears enlarged at 12.1 cm in length. 2. The endometrium measures 5.8 mm. 3. Bilateral ovaries appear WNL. 4. Best assessment was performed transabdominally.  Transvaginal scan was very limited due to uterine size and inability to penetrate.  Recommendations: 1.Clinical correlation with the patient's History and Physical Exam.   Dario Ave, RDMS  Assessment:   DUB Pelvic pain Fibromyalgia  Breast tenderness Abdominal pain Cervical cancer screening  Plan:   - Discussed non-pharmaceutical approaches to the  problem. Patient currently already engaged in pelvic floor physical therapy for pain which she notes does not help as much as she would like. Also receives pain medications for other comorbidities (including fibromyalgia).  Discussed option of seeking care with a pelvic pain specialist, vs laparoscopy to assess for adhesions.   Patient desires to see a specialist. [facility].  - DUB, ultrasound performed today.  Reviewed results with patient. No structural causes noted, but uterus was slightly enlarged. discussed management options, includingincluding tranexamic acid (Lysteda), oral progesterone (Megace), Depo Provera, Mirena IUD, endometrial ablation (Novasure/Hydrothermal Ablation) or hysterectomy as definitive surgical management.  Discussed risks and benefits of each method.   Patient declines all modalities. Notes that she was more concerned with her pelvic pain and that the bleeding is manageable.   Pap smear performed today during today's visit as she has not had one in several years, and is having abnormal bleeding.  - Breast tenderness, no known cause. May be secondary to hormonal imbalance, diet, etc. Discussed conservative management options. Patient initially declined hormonal therapy today, but then later was amenable to try a 1 month sample of birth control.  Given sample of Lo-Loestrin. May also help with bleeding.  - Patient has an appointment  with GI tomorrow to further workup abdominal pain.  - To f/u after consultation with pelvic pain specialist.     A total of 45 minutes were spent face-to-face with the patient during the encounter with greater than 50% dealing with counseling and coordination of care.   Rubie Maid, MD [facility]

## 2017-03-29 ENCOUNTER — Ambulatory Visit: Payer: Medicare Other | Admitting: Gastroenterology

## 2017-03-30 ENCOUNTER — Ambulatory Visit (INDEPENDENT_AMBULATORY_CARE_PROVIDER_SITE_OTHER): Payer: Medicare Other | Admitting: Family Medicine

## 2017-03-30 ENCOUNTER — Encounter: Payer: Self-pay | Admitting: Family Medicine

## 2017-03-30 VITALS — BP 120/82 | HR 99 | Temp 98.5°F | Wt 191.1 lb

## 2017-03-30 DIAGNOSIS — M9909 Segmental and somatic dysfunction of abdomen and other regions: Secondary | ICD-10-CM | POA: Diagnosis not present

## 2017-03-30 DIAGNOSIS — M9904 Segmental and somatic dysfunction of sacral region: Secondary | ICD-10-CM

## 2017-03-30 DIAGNOSIS — M9908 Segmental and somatic dysfunction of rib cage: Secondary | ICD-10-CM

## 2017-03-30 DIAGNOSIS — R102 Pelvic and perineal pain: Secondary | ICD-10-CM

## 2017-03-30 DIAGNOSIS — R062 Wheezing: Secondary | ICD-10-CM

## 2017-03-30 DIAGNOSIS — J42 Unspecified chronic bronchitis: Secondary | ICD-10-CM

## 2017-03-30 DIAGNOSIS — G8929 Other chronic pain: Secondary | ICD-10-CM | POA: Diagnosis not present

## 2017-03-30 DIAGNOSIS — M9903 Segmental and somatic dysfunction of lumbar region: Secondary | ICD-10-CM

## 2017-03-30 DIAGNOSIS — M9906 Segmental and somatic dysfunction of lower extremity: Secondary | ICD-10-CM

## 2017-03-30 DIAGNOSIS — M9905 Segmental and somatic dysfunction of pelvic region: Secondary | ICD-10-CM

## 2017-03-30 MED ORDER — OXYCODONE-ACETAMINOPHEN 5-325 MG PO TABS: 1.0000 | ORAL_TABLET | Freq: Four times a day (QID) | ORAL | 0 refills | Status: DC | PRN

## 2017-03-30 MED ORDER — ALBUTEROL SULFATE (2.5 MG/3ML) 0.083% IN NEBU
2.5000 mg | INHALATION_SOLUTION | Freq: Once | RESPIRATORY_TRACT | Status: AC
  Administered 2017-03-30: 2.5 mg via RESPIRATORY_TRACT

## 2017-03-30 NOTE — Progress Notes (Signed)
BP 120/82 (BP Location: Left Arm, Patient Position: Sitting, Cuff Size: Normal)   Pulse 99   Temp 98.5 F (36.9 C)   Wt 191 lb 2 oz (86.7 kg)   LMP 03/01/2017   SpO2 99%   BMI 36.11 kg/m    Subjective:    Patient ID: Sharon Rodriguez, female    DOB: 01/15/1981, 37 y.o.   MRN: [id number]  HPI: Sharon Rodriguez is a 37 y.o. female  Chief Complaint  Patient presents with  . Pelvic Pain  . Cough   Here today for follow up on her pelvic pain and possible treatment with OMT. She has seen pelvic PT since her last visit and is getting set with them. She feels like it helps, but that it doesn't fix the problem. They referred her to GYN and she saw them yesterday. She saw them for irregular bleeding and pelvic pain. Had transvaginal US which showed an enlarged uterus. She has been referred to a specialist for her pelvic pain and is going to be evaluated by them at [facility].   Today, she notes that her hips have been good. She notes that her low back has been doing poorly. Her low back has been going on for 3 months. Pain is sharp and tingling. Better with pain medicine and sitting. Worse with standing.   CHRONIC PAIN  Present dose: 62.5 Morphine equivalents Pain control status: stable Duration: chronic Location: widespread Quality: tight, throbbing, aching Current Pain Level: moderate Previous Pain Level: severe Breakthrough pain: yes Benefit from narcotic medications: yes What Activities task can be accomplished with current medication?: Taking care of her kids Interested in weaning off narcotics:no   Stool softners/OTC fiber: no  Previous pain specialty evaluation: yes Non-narcotic analgesic meds: yes Narcotic contract: yes  UPPER RESPIRATORY TRACT INFECTION Duration: 3 days Worst symptom: SOB and cough Fever: no Cough: yes Shortness of breath: yes Wheezing: yes Chest pain: yes, with cough Chest tightness: yes Chest congestion: yes Nasal congestion: no Runny nose: no Post  nasal drip: no Sneezing: no Sore throat: no Swollen glands: no Sinus pressure: no Headache: yes Face pain: no Toothache: no Ear pain: no  Ear pressure: no  Eyes red/itching:yes Eye drainage/crusting: yes  Vomiting: no Rash: no Fatigue: yes Sick contacts: yes Strep contacts: no  Context: worse Recurrent sinusitis: no Relief with OTC cold/cough medications: no  Treatments attempted: none    Relevant past medical, surgical, family and social history reviewed and updated as indicated. Interim medical history since our last visit reviewed. Allergies and medications reviewed and updated.  Review of Systems  Constitutional: Negative.   Respiratory: Negative.   Cardiovascular: Negative.   Gastrointestinal: Negative.   Musculoskeletal: Positive for arthralgias, back pain, joint swelling, myalgias, neck pain and neck stiffness. Negative for gait problem.  Skin: Negative.   Neurological: Negative.   Psychiatric/Behavioral: Negative.     Per HPI unless specifically indicated above     Objective:    BP 120/82 (BP Location: Left Arm, Patient Position: Sitting, Cuff Size: Normal)   Pulse 99   Temp 98.5 F (36.9 C)   Wt 191 lb 2 oz (86.7 kg)   LMP 03/01/2017   SpO2 99%   BMI 36.11 kg/m   Wt Readings from Last 3 Encounters:  03/30/17 191 lb 2 oz (86.7 kg)  03/28/17 191 lb 9.6 oz (86.9 kg)  03/01/17 188 lb (85.3 kg)    Physical Exam  Constitutional: She is oriented to person, place, and time. She  appears well-developed and well-nourished. No distress.  HENT:  Head: Normocephalic and atraumatic.  Right Ear: Hearing normal.  Left Ear: Hearing normal.  Nose: Nose normal.  Eyes: Conjunctivae and lids are normal. Right eye exhibits no discharge. Left eye exhibits no discharge. No scleral icterus.  Cardiovascular: Normal rate, regular rhythm, normal heart sounds and intact distal pulses. Exam reveals no gallop and no friction rub.  No murmur heard. Pulmonary/Chest: Effort  normal. No respiratory distress. She has wheezes. She has no rales. She exhibits no tenderness.  Abdominal: Soft. She exhibits no distension and no mass. There is no tenderness. There is no rebound and no guarding.  Neurological: She is alert and oriented to person, place, and time.  Skin: Skin is warm, dry and intact. No rash noted. She is not diaphoretic. No erythema. No pallor.  Psychiatric: She has a normal mood and affect. Her speech is normal and behavior is normal. Judgment and thought content normal. Cognition and memory are normal.  Musculoskeletal:  Exam found Decreased ROM, Tissue texture changes, Tenderness to palpation and Asymmetry of patient's  ribs, lumbar, pelvis, sacrum, lower extremity and abdomen Osteopathic Structural Exam:   Ribs: Ribs 5-7 locked up on the R, ribs 8-9 locked up on the L  Lumbar: QL hypertonic on the L, L3-5SLRR  Pelvis: Anterior R innominate  Sacrum: L on R torsion, SI joint restricted on the L  Lower Extremity: Glut spasm on the R  Abdomen: pelvic diaphragm restricted with strain into the L innominate, abdominal diaphragm spasm R>L   Results for orders placed or performed in visit on 01/30/17  CBC with Differential/Platelet  Result Value Ref Range   WBC 10.4 3.4 - 10.8 x10E3/uL   RBC 4.61 3.77 - 5.28 x10E6/uL   Hemoglobin 12.4 11.1 - 15.9 g/dL   Hematocrit 37.7 34.0 - 46.6 %   MCV 82 79 - 97 fL   MCH 26.9 26.6 - 33.0 pg   MCHC 32.9 31.5 - 35.7 g/dL   RDW 15.1 12.3 - 15.4 %   Platelets 272 150 - 379 x10E3/uL   Neutrophils 71 Not Estab. %   Lymphs 24 Not Estab. %   Monocytes 5 Not Estab. %   Eos 0 Not Estab. %   Basos 0 Not Estab. %   Neutrophils Absolute 7.4 (H) 1.4 - 7.0 x10E3/uL   Lymphocytes Absolute 2.5 0.7 - 3.1 x10E3/uL   Monocytes Absolute 0.5 0.1 - 0.9 x10E3/uL   EOS (ABSOLUTE) 0.0 0.0 - 0.4 x10E3/uL   Basophils Absolute 0.0 0.0 - 0.2 x10E3/uL   Immature Granulocytes 0 Not Estab. %   Immature Grans (Abs) 0.0 0.0 - 0.1 x10E3/uL  TSH   Result Value Ref Range   TSH 3.960 0.450 - 4.500 uIU/mL      Assessment & Plan:   Problem List Items Addressed This Visit      Respiratory   Chronic bronchitis (HCC)    Better following neb- will get her home nebs. Encouraged her to use her inhalers. Call with any concerns.         Other   Chronic pain    Will get her back into the pain clinic- she needs to see pain psychology, information about where she needs to go given. Refill of her medicine given today. Recheck 1 month if she has not had her medicine taken over by pain yet.       Relevant Medications   oxyCODONE-acetaminophen (PERCOCET) 5-325 MG tablet   Chronic pelvic pain in female  Going to pelvic pain specialist shortly, which we are happy about. Await their input. Has been doing pelvic PT with good results. Seeing GYN and with pretty significantly enlarged uterus. Pelvis appears pretty good today with less somatic dysfunction than usual. Does have some issues with her sacrum which may be due to fascial drag from her uterus. Treated today with good results as below.      Relevant Medications   oxyCODONE-acetaminophen (PERCOCET) 5-325 MG tablet    Other Visit Diagnoses    Wheezing    -  Primary   Better following neb- will get her home nebs. Encouraged her to use her inhalers. Call with any concerns.    Relevant Medications   albuterol (PROVENTIL) (2.5 MG/3ML) 0.083% nebulizer solution 2.5 mg (Completed)   Sacral region somatic dysfunction       Pelvic region somatic dysfunction       Segmental dysfunction of abdomen       Rib cage region somatic dysfunction       Somatic dysfunction of spine, lumbar       Somatic dysfunction of lower extremities         After verbal consent was obtained, patient was treated today with osteopathic manipulative medicine to the regions of the ribs, lumbar, pelvis, sacrum, abdomen and lower extremity using the techniques of myofascial release, counterstrain, muscle energy and soft  tissue. Areas of compensation relating to her primary pain source also treated. Patient tolerated the procedure well with good objective and good subjective improvement in symptoms. .sex left the room in good condition. He was advised to stay well hydrated and that he may have some soreness following the procedure. If not improving or worsening, he will call and come in. She will return for reevaluation  In 3-4 weeks.   Follow up plan: Return in about 4 weeks (around 04/27/2017) for Procedure visit.

## 2017-03-30 NOTE — Patient Instructions (Signed)
[facility] [employer]  [address] [place], [place] [address] [phone]  Walk in between 9:00am to 4:00pm Monday through Friday  Call [facility] @ [phone] after seeing [facility] to let them know that you have been evaluated by [place].

## 2017-04-02 ENCOUNTER — Encounter: Payer: Self-pay | Admitting: Family Medicine

## 2017-04-02 NOTE — Assessment & Plan Note (Signed)
Will get her back into the pain clinic- she needs to see pain psychology, information about where she needs to go given. Refill of her medicine given today. Recheck 1 month if she has not had her medicine taken over by pain yet.

## 2017-04-02 NOTE — Assessment & Plan Note (Signed)
Better following neb- will get her home nebs. Encouraged her to use her inhalers. Call with any concerns.

## 2017-04-02 NOTE — Assessment & Plan Note (Signed)
Going to pelvic pain specialist shortly, which we are happy about. Await their input. Has been doing pelvic PT with good results. Seeing GYN and with pretty significantly enlarged uterus. Pelvis appears pretty good today with less somatic dysfunction than usual. Does have some issues with her sacrum which may be due to fascial drag from her uterus. Treated today with good results as below.

## 2017-04-03 ENCOUNTER — Encounter: Payer: Medicare Other | Admitting: Physical Therapy

## 2017-04-03 LAB — IGP, COBASHPV16/18
HPV 16: NEGATIVE
HPV 18: NEGATIVE
HPV other hr types: NEGATIVE
PAP Smear Comment: 0

## 2017-04-04 ENCOUNTER — Telehealth: Payer: Self-pay | Admitting: Obstetrics and Gynecology

## 2017-04-04 NOTE — Telephone Encounter (Signed)
Left message on machine to call back    Notes recorded by Rubie Maid, MD on 04/03/2017 at 9:11 PM EST Please inform of normal pap smear.

## 2017-04-06 ENCOUNTER — Telehealth: Payer: Self-pay | Admitting: Obstetrics and Gynecology

## 2017-04-09 ENCOUNTER — Ambulatory Visit: Payer: Medicare Other | Admitting: Physical Therapy

## 2017-04-10 ENCOUNTER — Other Ambulatory Visit: Payer: Self-pay

## 2017-04-10 ENCOUNTER — Encounter: Payer: Self-pay | Admitting: Gastroenterology

## 2017-04-10 ENCOUNTER — Other Ambulatory Visit
Admission: RE | Admit: 2017-04-10 | Discharge: 2017-04-10 | Disposition: A | Discharge status: Home / Self Care | Payer: Medicare Other | Source: Ambulatory Visit | Attending: Gastroenterology | Admitting: Gastroenterology

## 2017-04-10 ENCOUNTER — Ambulatory Visit (INDEPENDENT_AMBULATORY_CARE_PROVIDER_SITE_OTHER): Payer: Medicare Other | Admitting: Gastroenterology

## 2017-04-10 VITALS — BP 90/60 | HR 89 | Temp 98.1°F | Ht 61.0 in | Wt 192.6 lb

## 2017-04-10 DIAGNOSIS — R197 Diarrhea, unspecified: Secondary | ICD-10-CM | POA: Diagnosis not present

## 2017-04-10 DIAGNOSIS — R109 Unspecified abdominal pain: Secondary | ICD-10-CM | POA: Diagnosis not present

## 2017-04-10 LAB — HEPATIC FUNCTION PANEL
ALK PHOS: 63 U/L (ref 38–126)
ALT: 28 U/L (ref 14–54)
AST: 48 U/L — ABNORMAL HIGH (ref 15–41)
Albumin: 4.2 g/dL (ref 3.5–5.0)
BILIRUBIN TOTAL: 0.3 mg/dL (ref 0.3–1.2)
Total Protein: 8.2 g/dL — ABNORMAL HIGH (ref 6.5–8.1)

## 2017-04-10 LAB — PREGNANCY, URINE: Preg Test, Ur: NEGATIVE

## 2017-04-10 LAB — LIPASE, BLOOD: LIPASE: 43 U/L (ref 11–51)

## 2017-04-10 MED ORDER — OMEPRAZOLE 40 MG PO CPDR: 40.0000 mg | DELAYED_RELEASE_CAPSULE | Freq: Every day | ORAL | 3 refills | Status: DC

## 2017-04-10 NOTE — Progress Notes (Signed)
Jonathon Bellows MD, MRCP(U.K) [address]  [place], [place] [address]  Main: [phone]  Fax: [phone]   Gastroenterology Consultation  Referring Provider:     Valerie Roys, DO Primary Care Physician:  Valerie Roys, DO Primary Gastroenterologist:  Dr. Jonathon Bellows  Reason for Consultation:     Abdominal pain         HPI:   Sharon Rodriguez is a 37 y.o. y/o female referred for consultation & management  by Dr. Wynetta Emery, Megan P, DO.    She has been referred for RUQ pain. RUQ USG 03/2017 showed no abnormality except fatty infiltration    Abdominal pain: Onset: 3-4 weeks back, getting worse. Comes and goes, lately stays more often , has it most days, each episode of pain lasts for hours , does wake her up fropm her sleep  Site :upper abdomen , some burning in her umbilicus Radiation: goes to her back  Severity :9/10 sharp , tingling , burning  Nature of pain:  Aggravating factors: eating , while she is eating  Relieving factors :layoing down  Weight loss: no  NSAID use: naprosyn : 1 tablet a day for weeks  PPI use :none  Gall bladder surgery: intact - mother had gall bladder issues -taken out she things  Frequency of bowel movements: diarrhea- since 4 weeks, few bowel movements a day upto 5 times a day, mushy ,no blood  Change in bowel movements:  Relief with bowel movements: little  Gas/Bloating/Abdominal distension: yes   No artificical sugars, drinks sweet tea- 3 glasses a day , no candy. No diet sodas.   Not pregnant. No alcohol consumption. She does have pain during her periods which is different from this pain. No pain during sexual intercourse. No change I stress levels.      Past Medical History:  Diagnosis Date  . ADD (attention deficit disorder)   . Anxiety   . Chronic bronchitis ([place])   . Chronic fatigue   . Chronic pain   . Depression   . Fibromyalgia   . Hypothyroidism 01/27/2015  . Learning disability   . Osteoarthritis   .  Rheumatoid arthritis ([place])   . Sleep apnea   . Thyroid disease   . TMJ (dislocation of temporomandibular joint)     Past Surgical History:  Procedure Laterality Date  . CESAREAN SECTION     2    Prior to Admission medications   Medication Sig Start Date End Date Taking? Authorizing Provider  albuterol (PROVENTIL) (2.5 MG/3ML) 0.083% nebulizer solution Frequency:Q4HPRN   Dosage:0.0     Instructions:  Note:Dose: 1  HE:N27 07/22/15   Johnson, Megan P, DO  amphetamine-dextroamphetamine (ADDERALL) 30 MG tablet Take 30 mg by mouth daily. 09/06/16   [provider]  fluconazole (DIFLUCAN) 150 MG tablet  03/30/17   [provider]  levothyroxine (SYNTHROID, LEVOTHROID) 50 MCG tablet Take 1 tablet (50 mcg total) by mouth daily before breakfast. NAME BRAND ONLY PLEASE 07/06/16   Johnson, Megan P, DO  naproxen (NAPROSYN) 500 MG tablet Take 1 tablet (500 mg total) by mouth 3 (three) times daily with meals. 11/03/16   Johnson, Megan P, DO  oxyCODONE-acetaminophen (PERCOCET) 5-325 MG tablet Take 1-2 tablets by mouth every 6 (six) hours as needed for severe pain. 03/30/17   Park Liter P, DO  Vitamin D, Ergocalciferol, (DRISDOL) 50000 units CAPS capsule Take 1 capsule (50,000 Units total) by mouth every 7 (seven) days. 10/04/16   Park Liter  P, DO    Family History  Problem Relation Age of Onset  . Diabetes Mother   . ADD / ADHD Mother   . Learning disabilities Mother   . COPD Mother   . Diabetes Father   . Hyperlipidemia Father   . Mental illness Father        Depression/anxiety  . Mental illness Sister        Depression/Anxiety  . Arthritis Brother        Rheumatoid  . Mental illness Daughter        anxiety  . Thyroid disease Daughter   . ADD / ADHD Daughter   . Allergies Daughter   . Cancer Maternal Grandmother        Skin, Lung  . Heart disease Maternal Grandfather   . Stroke Maternal Grandfather   . Heart attack Maternal Grandfather   . Arthritis Paternal  Grandmother        Rheumatoid  . Dementia Paternal Grandmother   . Allergies Daughter      Social History   Tobacco Use  . Smoking status: Never Smoker  . Smokeless tobacco: Never Used  Substance Use Topics  . Alcohol use: No  . Drug use: No    Allergies as of 04/10/2017 - Review Complete 04/02/2017  Allergen Reaction Noted  . Ibuprofen Diarrhea and Nausea Only 01/27/2015  . Promethazine Rash 01/27/2015    Review of Systems:    All systems reviewed and negative except where noted in HPI.   Physical Exam:  There were no vitals taken for this visit. No LMP recorded. Psych:  Alert and cooperative. Normal mood and affect. General:   Alert,  Well-developed, well-nourished, pleasant and cooperative in NAD Head:  Normocephalic and atraumatic. Eyes:  Sclera clear, no icterus.   Conjunctiva pink. Ears:  Normal auditory acuity. Nose:  No deformity, discharge, or lesions. Mouth:  No deformity or lesions,oropharynx pink & moist. Neck:  Supple; no masses or thyromegaly. Lungs:  Respirations even and unlabored.  Clear throughout to auscultation.   No wheezes, crackles, or rhonchi. No acute distress. Heart:  Regular rate and rhythm; no murmurs, clicks, rubs, or gallops. Abdomen:  Normal bowel sounds.  No bruits.  Soft, non-tender and non-distended without masses, hepatosplenomegaly or hernias noted.  No guarding or rebound tenderness.    Msk:  Symmetrical without gross deformities. Good, equal movement & strength bilaterally. Pulses:  Normal pulses noted. Extremities:  No clubbing or edema.  No cyanosis. Neurologic:  Alert and oriented x3;  grossly normal neurologically. Skin:  Intact without significant lesions or rashes. No jaundice. Lymph Nodes:  No significant cervical adenopathy. Psych:  Alert and cooperative. Normal mood and affect.  Imaging Studies: US Pelvis Transvanginal Non-ob (tv Only)  Result Date: 03/31/2017 ULTRASOUND REPORT Location: [facility] Date of  Service: 03/28/2017 Indications: DUB Findings: Very limited transvaginal exam due to uterine size and inability to penetrate.  The most optimal images were obtained transabdominally. The uterus measures 12.1 x 6.0 x 5.0 cm. Echo texture is homogeneous without evidence of focal masses. The Endometrium measures 5.8 mm. Right Ovary measures 2.3 x 1.8 x 1.8 cm and appears WNL. Left Ovary measures 2.3 x 1.6 x 1.6 cm and appears WNL. Survey of the adnexa demonstrates no adnexal masses. There is no free fluid in the cul de sac. Impression: 1. Anteverted uterus appears enlarged at 12.1 cm in length. 2. The endometrium measures 5.8 mm. 3. Bilateral ovaries appear WNL. 4. Best assessment was performed transabdominally.  Transvaginal scan was very limited due to uterine size and inability to penetrate. Recommendations: 1.Clinical correlation with the patient's History and Physical Exam. Dario Ave, RDMS I have reviewed this study and agree with documented findings. Rubie Maid, MD [facility]   US Abdomen Limited Ruq  Result Date: 03/22/2017 CLINICAL DATA:  Right upper quadrant pain for 2 weeks, nausea. EXAM: ULTRASOUND ABDOMEN LIMITED RIGHT UPPER QUADRANT COMPARISON:  None. FINDINGS: Gallbladder: No gallstones or wall thickening visualized. No sonographic Murphy sign noted by sonographer. Common bile duct: Diameter: Normal at 3 mm Liver: No focal lesion identified. Diffusely echogenic indicating fatty infiltration. Portal vein is patent on color Doppler imaging with normal direction of blood flow towards the liver. IMPRESSION: 1. No acute findings. 2. Fatty infiltration of the liver. Electronically Signed   By: Franki Cabot M.D.   On: 03/22/2017 15:08    Assessment and Plan:   AKSHARA BLUMENTHAL is a 37 y.o. y/o female has been referred for abdominal pain and diarrhea. Differentials include infectious diarrhea, NSAID related colitis.    Plan  1. Stop NSAID's 2. Commence on PPI 3. Stop sweet tea as the  fructose in the drink can cause osmotic diarrhea 4. Stool tests to r/o infectious diarrhea, pregnancy test and Lipase 5. Ct scan of the abdomen and pelvis 6. If no better at the next visit will plan for an EGD 7. Stool H pylori antigen    Follow up in 4 weeks  Dr Jonathon Bellows MD,MRCP(U.K)

## 2017-04-12 LAB — CELIAC DISEASE PANEL
Endomysial Ab, IgA: NEGATIVE
IGA: 337 mg/dL (ref 87–352)
Tissue Transglutaminase Ab, IgA: <2 U/mL (ref 0–3)

## 2017-04-13 ENCOUNTER — Other Ambulatory Visit
Admission: RE | Admit: 2017-04-13 | Discharge: 2017-04-13 | Disposition: A | Discharge status: Home / Self Care | Payer: Medicare Other | Source: Ambulatory Visit | Attending: Gastroenterology | Admitting: Gastroenterology

## 2017-04-13 DIAGNOSIS — R197 Diarrhea, unspecified: Secondary | ICD-10-CM | POA: Insufficient documentation

## 2017-04-13 LAB — GASTROINTESTINAL PANEL BY PCR, STOOL (REPLACES STOOL CULTURE)
Adenovirus F40/41: NOT DETECTED
Astrovirus: NOT DETECTED
CYCLOSPORA CAYETANENSIS: NOT DETECTED
Campylobacter species: NOT DETECTED
Cryptosporidium: NOT DETECTED
ENTAMOEBA HISTOLYTICA: NOT DETECTED
ENTEROAGGREGATIVE E COLI (EAEC): NOT DETECTED
Enteropathogenic E coli (EPEC): NOT DETECTED
Enterotoxigenic E coli (ETEC): NOT DETECTED
GIARDIA LAMBLIA: NOT DETECTED
Norovirus GI/GII: NOT DETECTED
Plesimonas shigelloides: NOT DETECTED
Rotavirus A: NOT DETECTED
SALMONELLA SPECIES: NOT DETECTED
SAPOVIRUS (I, II, IV, AND V): NOT DETECTED
SHIGELLA/ENTEROINVASIVE E COLI (EIEC): NOT DETECTED
Shiga like toxin producing E coli (STEC): NOT DETECTED
VIBRIO CHOLERAE: NOT DETECTED
VIBRIO SPECIES: NOT DETECTED
Yersinia enterocolitica: NOT DETECTED

## 2017-04-13 LAB — C DIFFICILE QUICK SCREEN W PCR REFLEX
C DIFFICILE (CDIFF) INTERP: NOT DETECTED
C DIFFICLE (CDIFF) ANTIGEN: NEGATIVE
C Diff toxin: NEGATIVE

## 2017-04-13 NOTE — Telephone Encounter (Signed)
Spoke with pt- see pap smear.

## 2017-04-13 NOTE — Telephone Encounter (Signed)
Forwarding result note as well

## 2017-04-15 LAB — H. PYLORI ANTIGEN, STOOL: H. Pylori Stool Ag, Eia: NEGATIVE

## 2017-04-16 LAB — CALPROTECTIN, FECAL: Calprotectin, Fecal: 43 ug/g (ref 0–120)

## 2017-04-18 ENCOUNTER — Telehealth: Payer: Self-pay | Admitting: *Deleted

## 2017-04-18 ENCOUNTER — Other Ambulatory Visit: Payer: Self-pay

## 2017-04-18 MED ORDER — DICYCLOMINE HCL 10 MG PO CAPS: 10.0000 mg | ORAL_CAPSULE | Freq: Three times a day (TID) | ORAL | 0 refills | Status: DC

## 2017-04-18 NOTE — Telephone Encounter (Signed)
Patient called because she is taking Prilosec daily and she stated that it is not helping at all, she wants to know if she needs to switch to something different or up the dosage.

## 2017-04-19 ENCOUNTER — Encounter: Payer: Self-pay | Admitting: Gastroenterology

## 2017-04-19 ENCOUNTER — Other Ambulatory Visit: Payer: Self-pay

## 2017-04-19 ENCOUNTER — Telehealth: Payer: Self-pay

## 2017-04-19 DIAGNOSIS — R1084 Generalized abdominal pain: Secondary | ICD-10-CM

## 2017-04-19 NOTE — Telephone Encounter (Signed)
LVM for patient callback.   Cleared to schedule CT Abd/Pelvis w/ contrast.

## 2017-04-20 ENCOUNTER — Other Ambulatory Visit: Payer: Self-pay

## 2017-04-20 ENCOUNTER — Telehealth: Payer: Self-pay

## 2017-04-20 NOTE — Telephone Encounter (Signed)
Advised patient to contact Central Scheduling for ct abd/pelvis.   [phone]

## 2017-04-20 NOTE — Telephone Encounter (Signed)
LVM for patient callback (x2).  Labs clear. Patient can schedule CT abd/pelvis.

## 2017-04-22 ENCOUNTER — Encounter: Payer: Self-pay | Admitting: Gastroenterology

## 2017-04-30 ENCOUNTER — Ambulatory Visit (INDEPENDENT_AMBULATORY_CARE_PROVIDER_SITE_OTHER): Payer: Medicare Other | Admitting: Family Medicine

## 2017-04-30 ENCOUNTER — Ambulatory Visit
Admission: RE | Admit: 2017-04-30 | Discharge: 2017-04-30 | Disposition: A | Discharge status: Home / Self Care | Payer: Medicare Other | Source: Ambulatory Visit | Attending: Gastroenterology | Admitting: Gastroenterology

## 2017-04-30 ENCOUNTER — Ambulatory Visit: Payer: Medicare Other | Admitting: Student in an Organized Health Care Education/Training Program

## 2017-04-30 ENCOUNTER — Encounter: Payer: Self-pay | Admitting: Family Medicine

## 2017-04-30 VITALS — BP 124/82 | HR 109 | Temp 98.5°F | Wt 197.0 lb

## 2017-04-30 DIAGNOSIS — M9903 Segmental and somatic dysfunction of lumbar region: Secondary | ICD-10-CM

## 2017-04-30 DIAGNOSIS — R102 Pelvic and perineal pain: Secondary | ICD-10-CM | POA: Diagnosis not present

## 2017-04-30 DIAGNOSIS — R1084 Generalized abdominal pain: Secondary | ICD-10-CM | POA: Insufficient documentation

## 2017-04-30 DIAGNOSIS — M9905 Segmental and somatic dysfunction of pelvic region: Secondary | ICD-10-CM | POA: Diagnosis not present

## 2017-04-30 DIAGNOSIS — M9909 Segmental and somatic dysfunction of abdomen and other regions: Secondary | ICD-10-CM

## 2017-04-30 DIAGNOSIS — M9904 Segmental and somatic dysfunction of sacral region: Secondary | ICD-10-CM | POA: Diagnosis not present

## 2017-04-30 DIAGNOSIS — M9900 Segmental and somatic dysfunction of head region: Secondary | ICD-10-CM

## 2017-04-30 DIAGNOSIS — M9902 Segmental and somatic dysfunction of thoracic region: Secondary | ICD-10-CM

## 2017-04-30 DIAGNOSIS — R1011 Right upper quadrant pain: Secondary | ICD-10-CM | POA: Diagnosis not present

## 2017-04-30 DIAGNOSIS — K760 Fatty (change of) liver, not elsewhere classified: Secondary | ICD-10-CM | POA: Diagnosis not present

## 2017-04-30 DIAGNOSIS — M9901 Segmental and somatic dysfunction of cervical region: Secondary | ICD-10-CM

## 2017-04-30 DIAGNOSIS — G8929 Other chronic pain: Secondary | ICD-10-CM | POA: Diagnosis not present

## 2017-04-30 DIAGNOSIS — M9908 Segmental and somatic dysfunction of rib cage: Secondary | ICD-10-CM

## 2017-04-30 MED ORDER — OXYCODONE-ACETAMINOPHEN 5-325 MG PO TABS: 1.0000 | ORAL_TABLET | Freq: Four times a day (QID) | ORAL | 0 refills | Status: DC | PRN

## 2017-04-30 MED ORDER — IOPAMIDOL (ISOVUE-300) INJECTION 61%
100.0000 mL | Freq: Once | INTRAVENOUS | Status: AC | PRN
  Administered 2017-04-30: 100 mL via INTRAVENOUS

## 2017-04-30 NOTE — Progress Notes (Signed)
BP 124/82 (BP Location: Left Arm, Patient Position: Sitting, Cuff Size: Normal)   Pulse (!) 109   Temp 98.5 F (36.9 C)   Wt 197 lb (89.4 kg)   LMP 04/02/2017 (Approximate)   SpO2 96%   BMI 37.22 kg/m    Subjective:    Patient ID: Sharon Rodriguez, female    DOB: April 29, 1980, 37 y.o.   MRN: [id number]  HPI: Sharon Rodriguez is a 37 y.o. female  Chief Complaint  Patient presents with  . Pelvic Pain  . Pain   He pain is primarily in the RUQ. She has been having constipation and diarrhea and issues with pain in her belly. She is very frustrated with this. She has been following with GI for abdominal pain. Had labs done, which were normal. She had a CT of her abdomen and pelvis done today. Results not available at this time. She currently does not have a follow up scheduled with Dr. Vicente Males yet. She has an appointment to see the GYN pelvic pain specialist on 05/10/17  Pain in her pelvis is about a 5/10. Pain is about the same. She is unsure if OMT has been helping. She has been feeling frustrated and not doing well. She notes that she has been having issues with not being able to walk. She notes that she can't do anything. Pain is pretty constant, but gets better and worse. Mainly in the pubic bone, with some radiation deep internally. Better with rest. Worse with walking and moving around. Chronic and constant. At baseline today.   CHRONIC PAIN- has not seen the local pain management again.  She is scheduled to see them tomorrow. She notes that she has not seen the  Present dose: 62.5 Morphine equivalents Pain control status: stable Duration: chronic Location: widespread Quality: tight, throbbing, aching Current Pain Level: moderate 5/10 Previous Pain Level: severe Breakthrough pain: yes Benefit from narcotic medications: yes What Activities task can be accomplished with current medication?: Taking care of her kids Interested in weaning off narcotics:no   Stool softners/OTC fiber: no    Previous pain specialty evaluation: yes Non-narcotic analgesic meds: yes Narcotic contract: yes  Relevant past medical, surgical, family and social history reviewed and updated as indicated. Interim medical history since our last visit reviewed. Allergies and medications reviewed and updated.  Review of Systems  Respiratory: Negative.   Cardiovascular: Negative.   Musculoskeletal: Positive for arthralgias, back pain, gait problem and myalgias. Negative for joint swelling, neck pain and neck stiffness.  Skin: Negative.   Neurological: Positive for weakness. Negative for dizziness, tremors, seizures, syncope, facial asymmetry, speech difficulty, light-headedness, numbness and headaches.  Psychiatric/Behavioral: Negative.     Per HPI unless specifically indicated above     Objective:    BP 124/82 (BP Location: Left Arm, Patient Position: Sitting, Cuff Size: Normal)   Pulse (!) 109   Temp 98.5 F (36.9 C)   Wt 197 lb (89.4 kg)   LMP 04/02/2017 (Approximate)   SpO2 96%   BMI 37.22 kg/m   Wt Readings from Last 3 Encounters:  04/30/17 197 lb (89.4 kg)  04/10/17 192 lb 9.6 oz (87.4 kg)  03/30/17 191 lb 2 oz (86.7 kg)    Physical Exam  Constitutional: She is oriented to person, place, and time. She appears well-developed and well-nourished. No distress.  HENT:  Head: Normocephalic and atraumatic.  Right Ear: Hearing normal.  Left Ear: Hearing normal.  Nose: Nose normal.  Eyes: Conjunctivae and lids are normal. Right  eye exhibits no discharge. Left eye exhibits no discharge. No scleral icterus.  Cardiovascular: Normal rate, regular rhythm, normal heart sounds and intact distal pulses. Exam reveals no gallop and no friction rub.  No murmur heard. Pulmonary/Chest: Effort normal and breath sounds normal. No respiratory distress. She has no wheezes. She has no rales. She exhibits no tenderness.  Abdominal: Soft. She exhibits no distension and no mass. There is no tenderness. There  is no rebound and no guarding.  Neurological: She is alert and oriented to person, place, and time.  Skin: Skin is warm, dry and intact. No rash noted. She is not diaphoretic. No pallor.  Psychiatric: She has a normal mood and affect. Her speech is normal and behavior is normal. Judgment and thought content normal. Cognition and memory are normal.  Nursing note and vitals reviewed. Musculoskeletal:  Exam found Decreased ROM, Tissue texture changes, Tenderness to palpation and Asymmetry of patient's  head, neck, thorax, ribs, lumbar, pelvis, sacrum and abdomen Osteopathic Structural Exam:   Head: hypertonic suboccipital muscles, OAESSR  Neck: C3ESRR, posterior scalene spasm on the R  Thorax: T3-5SLRR, trap spasm on the R  Ribs: Ribs 6-8 locked down on the R  Lumbar: QL and psoas hypertonic on the R, L4-5SRRL  Pelvis: Posterior R innominate  Sacrum: R on R torsion  Abdomen: diaphragm spasm on the R, Pelvic diaphragm restricted and pulled midline   Results for orders placed or performed during the hospital encounter of 04/13/17  Calprotectin, Fecal  Result Value Ref Range   Calprotectin, Fecal 43 0 - 120 ug/g  C difficile quick scan w PCR reflex  Result Value Ref Range   C Diff antigen NEGATIVE NEGATIVE   C Diff toxin NEGATIVE NEGATIVE   C Diff interpretation No C. difficile detected.   Gastrointestinal Panel by PCR , Stool  Result Value Ref Range   Campylobacter species NOT DETECTED NOT DETECTED   Plesimonas shigelloides NOT DETECTED NOT DETECTED   Salmonella species NOT DETECTED NOT DETECTED   Yersinia enterocolitica NOT DETECTED NOT DETECTED   Vibrio species NOT DETECTED NOT DETECTED   Vibrio cholerae NOT DETECTED NOT DETECTED   Enteroaggregative E coli (EAEC) NOT DETECTED NOT DETECTED   Enteropathogenic E coli (EPEC) NOT DETECTED NOT DETECTED   Enterotoxigenic E coli (ETEC) NOT DETECTED NOT DETECTED   Shiga like toxin producing E coli (STEC) NOT DETECTED NOT DETECTED    Shigella/Enteroinvasive E coli (EIEC) NOT DETECTED NOT DETECTED   Cryptosporidium NOT DETECTED NOT DETECTED   Cyclospora cayetanensis NOT DETECTED NOT DETECTED   Entamoeba histolytica NOT DETECTED NOT DETECTED   Giardia lamblia NOT DETECTED NOT DETECTED   Adenovirus F40/41 NOT DETECTED NOT DETECTED   Astrovirus NOT DETECTED NOT DETECTED   Norovirus GI/GII NOT DETECTED NOT DETECTED   Rotavirus A NOT DETECTED NOT DETECTED   Sapovirus (I, II, IV, and V) NOT DETECTED NOT DETECTED  H. pylori antigen, stool  Result Value Ref Range   H. Pylori Stool Ag, Eia Negative Negative      Assessment & Plan:   Problem List Items Addressed This Visit      Other   Chronic pain - Primary    Patient states that she has to pay $40 to see the pain psychologist, and she cannot afford that. Scheduled to go back to see pain management tomorrow for follow up on her injections. Patient's pain stable today on current regimen. PMP reviewed today and appropriate. Discussion had with patient today regarding her pain management, we  do short term pain management and were intending to bridge her until she got into pain management. However, it has been 7 months, and patient has not fully established with the local pain management. Discussed that we will give her her Rx this month, and for another 3 months, but then will have to stop. During this time frame, she will need to establish with a pain management doctor. She was not happy with this situation and expressed displeasure at the new rules regarding her medication, however, she is aware. Rx given today.      Relevant Medications   oxyCODONE-acetaminophen (PERCOCET) 5-325 MG tablet   Chronic pelvic pain in female    Scheduled to see the pelvic pain specialist in 10 days. Was scheduled for pelvic PT, but has not been going due to issues with her abdomen for which she has been seeing GI. She does have some somatic dysfunction today which may be contributing to her symptoms.  Treated today with fair results as below.       Relevant Medications   oxyCODONE-acetaminophen (PERCOCET) 5-325 MG tablet    Other Visit Diagnoses    Chronic RUQ pain       No better. Continue to follow with GI. Call with any concerns.    Relevant Medications   oxyCODONE-acetaminophen (PERCOCET) 5-325 MG tablet   Sacral region somatic dysfunction       Pelvic region somatic dysfunction       Rib cage region somatic dysfunction       Somatic dysfunction of spine, lumbar       Segmental dysfunction of abdomen       Thoracic region somatic dysfunction       Head region somatic dysfunction       Somatic dysfunction of cervical region         After verbal consent was obtained, patient was treated today with osteopathic manipulative medicine to the regions of the head, neck, thorax, ribs, lumbar, pelvis, sacrum and abdomen using the techniques of cranial, myofascial release, counterstrain, muscle energy, HVLA and soft tissue. Areas of compensation relating to her primary pain source also treated. Patient tolerated the procedure well with fair objective and fair subjective improvement in symptoms. She left the room in good condition. She was advised to stay well hydrated and that she may have some soreness following the procedure. If not improving or worsening, she will call and come in. She return for reevaluation  in 1-2 months.   Follow up plan: Return in about 4 weeks (around 05/28/2017) for procedure.

## 2017-04-30 NOTE — Assessment & Plan Note (Signed)
Scheduled to see the pelvic pain specialist in 10 days. Was scheduled for pelvic PT, but has not been going due to issues with her abdomen for which she has been seeing GI. She does have some somatic dysfunction today which may be contributing to her symptoms. Treated today with fair results as below.

## 2017-04-30 NOTE — Assessment & Plan Note (Signed)
Patient states that she has to pay $40 to see the pain psychologist, and she cannot afford that. Scheduled to go back to see pain management tomorrow for follow up on her injections. Patient's pain stable today on current regimen. PMP reviewed today and appropriate. Discussion had with patient today regarding her pain management, we do short term pain management and were intending to bridge her until she got into pain management. However, it has been 7 months, and patient has not fully established with the local pain management. Discussed that we will give her her Rx this month, and for another 3 months, but then will have to stop. During this time frame, she will need to establish with a pain management doctor. She was not happy with this situation and expressed displeasure at the new rules regarding her medication, however, she is aware. Rx given today.

## 2017-05-01 ENCOUNTER — Ambulatory Visit: Payer: Medicare Other | Admitting: Student in an Organized Health Care Education/Training Program

## 2017-05-02 ENCOUNTER — Telehealth: Payer: Self-pay | Admitting: Gastroenterology

## 2017-05-02 NOTE — Telephone Encounter (Signed)
Pt is calling for CT Results please call pt

## 2017-05-02 NOTE — Telephone Encounter (Signed)
Advised patient of results per Dr. Vicente Males.    - Fatty Liver  Emailed fatty liver diet.

## 2017-05-02 NOTE — Telephone Encounter (Signed)
Patient called &had a CT Scan on 04-30-17.Please call with results.

## 2017-05-04 ENCOUNTER — Encounter: Payer: Self-pay | Admitting: Gastroenterology

## 2017-05-10 DIAGNOSIS — N946 Dysmenorrhea, unspecified: Secondary | ICD-10-CM | POA: Diagnosis not present

## 2017-05-10 DIAGNOSIS — M899 Disorder of bone, unspecified: Secondary | ICD-10-CM | POA: Insufficient documentation

## 2017-05-10 DIAGNOSIS — R102 Pelvic and perineal pain: Secondary | ICD-10-CM | POA: Insufficient documentation

## 2017-05-28 ENCOUNTER — Ambulatory Visit (INDEPENDENT_AMBULATORY_CARE_PROVIDER_SITE_OTHER): Payer: Medicare Other | Admitting: Family Medicine

## 2017-05-28 ENCOUNTER — Encounter: Payer: Self-pay | Admitting: Family Medicine

## 2017-05-28 VITALS — BP 115/71 | HR 106 | Wt 197.0 lb

## 2017-05-28 DIAGNOSIS — M9903 Segmental and somatic dysfunction of lumbar region: Secondary | ICD-10-CM

## 2017-05-28 DIAGNOSIS — G8929 Other chronic pain: Secondary | ICD-10-CM

## 2017-05-28 DIAGNOSIS — M9902 Segmental and somatic dysfunction of thoracic region: Secondary | ICD-10-CM | POA: Diagnosis not present

## 2017-05-28 DIAGNOSIS — Z598 Other problems related to housing and economic circumstances: Secondary | ICD-10-CM

## 2017-05-28 DIAGNOSIS — M9909 Segmental and somatic dysfunction of abdomen and other regions: Secondary | ICD-10-CM | POA: Diagnosis not present

## 2017-05-28 DIAGNOSIS — M9904 Segmental and somatic dysfunction of sacral region: Secondary | ICD-10-CM | POA: Diagnosis not present

## 2017-05-28 DIAGNOSIS — M9901 Segmental and somatic dysfunction of cervical region: Secondary | ICD-10-CM

## 2017-05-28 DIAGNOSIS — M9900 Segmental and somatic dysfunction of head region: Secondary | ICD-10-CM

## 2017-05-28 DIAGNOSIS — M9908 Segmental and somatic dysfunction of rib cage: Secondary | ICD-10-CM | POA: Diagnosis not present

## 2017-05-28 DIAGNOSIS — M545 Low back pain, unspecified: Secondary | ICD-10-CM

## 2017-05-28 DIAGNOSIS — R102 Pelvic and perineal pain: Secondary | ICD-10-CM | POA: Diagnosis not present

## 2017-05-28 DIAGNOSIS — M9905 Segmental and somatic dysfunction of pelvic region: Secondary | ICD-10-CM

## 2017-05-28 DIAGNOSIS — Z599 Problem related to housing and economic circumstances, unspecified: Secondary | ICD-10-CM

## 2017-05-28 MED ORDER — OXYCODONE-ACETAMINOPHEN 5-325 MG PO TABS: 1.0000 | ORAL_TABLET | Freq: Four times a day (QID) | ORAL | 0 refills | Status: DC | PRN

## 2017-05-28 NOTE — Progress Notes (Signed)
BP 115/71   Pulse (!) 106   Wt 197 lb (89.4 kg)   SpO2 99%   BMI 37.22 kg/m    Subjective:    Patient ID: Sharon Rodriguez, female    DOB: 02/25/1981, 37 y.o.   MRN: [id number]  HPI: Sharon Rodriguez is a 37 y.o. female  Chief Complaint  Patient presents with  . Pain  . Pelvic Pain   Sharon Rodriguez saw pelvic pain specialist who recommended OMT, PT, SI joint belt and possibly NSAIDs. They did not think that this was due to adhesions. They recommended slow release steroid injections through pain management. They also recommended that she restart her gabapentin (300mg  QID). They will see her again in 6 months. She is not interested in restarting her gabapentin. She states that she did not like how it made her feel. She states that seeing the pelvic pain specialist was a waste of time and did not do anything for her.   She states that she is not doing well. Was working in the garden and has been having pain in her low back. Started this weekend. Has been sore. Has been on both sides, R side worse than the left. Feels like she needs injections again- has not called the pain center to get rescheduled for her injections. Nothing seems to be making her pain better or worse. Tried ice without benefit. She has been resting- has been helping a little, but not much. She notes that her pelvis has been OK at this time and it's mainly her back. Did OK after OMT last time with benefit until this last weekend  Has not seen the pain clinic. She states that she does not have the $40 to see the pain psychologist. She is attempting to have her psychiatrist write a note for the pain evaluation, but he doesn't know what they want. She is aware that we will only continue to write her pain medicine for another 3 months, and then will stop. She will have to establish with the pain center during that time.   CHRONIC PAIN  Present dose: 62.5 Morphine equivalents Pain control status: stable Duration: chronic Location:  wide spread Quality: tight, throbbing and aching Current Pain Level: 5/10 Previous Pain Level: severe Breakthrough pain: yes Benefit from narcotic medications: yes What Activities task can be accomplished with current medication? Able to take care of her kids Interested in weaning off narcotics:no   Stool softners/OTC fiber: no  Previous pain specialty evaluation: yes Non-narcotic analgesic meds: not currently, was on gabapentin, but now refuses it Narcotic contract: yes  Relevant past medical, surgical, family and social history reviewed and updated as indicated. Interim medical history since our last visit reviewed. Allergies and medications reviewed and updated.  Review of Systems  Constitutional: Negative.   Respiratory: Negative.   Cardiovascular: Negative.   Musculoskeletal: Positive for arthralgias, back pain and myalgias. Negative for gait problem, joint swelling, neck pain and neck stiffness.  Skin: Negative.   Neurological: Negative.   Psychiatric/Behavioral: Negative.     Per HPI unless specifically indicated above     Objective:    BP 115/71   Pulse (!) 106   Wt 197 lb (89.4 kg)   SpO2 99%   BMI 37.22 kg/m   Wt Readings from Last 3 Encounters:  05/28/17 197 lb (89.4 kg)  04/30/17 197 lb (89.4 kg)  04/10/17 192 lb 9.6 oz (87.4 kg)    Physical Exam  Constitutional: She is oriented to person, place,  and time. She appears well-developed and well-nourished. No distress.  HENT:  Head: Normocephalic and atraumatic.  Right Ear: Hearing normal.  Left Ear: Hearing normal.  Nose: Nose normal.  Eyes: Conjunctivae and lids are normal. Right eye exhibits no discharge. Left eye exhibits no discharge. No scleral icterus.  Cardiovascular: Normal rate, regular rhythm, normal heart sounds and intact distal pulses. Exam reveals no gallop and no friction rub.  No murmur heard. Pulmonary/Chest: Effort normal and breath sounds normal. No respiratory distress. She has no  wheezes. She has no rales. She exhibits no tenderness.  Abdominal: Soft. She exhibits no distension and no mass. There is no tenderness. There is no rebound and no guarding.  Neurological: She is alert and oriented to person, place, and time.  Skin: Skin is warm, dry and intact. No rash noted. She is not diaphoretic. No erythema. No pallor.  Psychiatric: She has a normal mood and affect. Her speech is normal and behavior is normal. Judgment and thought content normal. Cognition and memory are normal.  Nursing note and vitals reviewed. Musculoskeletal:  Exam found Decreased ROM, Tissue texture changes, Tenderness to palpation and Asymmetry of patient's  head, neck, thorax, lumbar, pelvis, sacrum and abdomen Osteopathic Structural Exam:   Head: hypertonic suboccipital muscles, OAESSR, OM suture restricted on the R, R torsion  Neck: SCM hypertonic on the R, C3ESRR  Thorax: T3-5SLRR, trap spasm on the R  Lumbar: QL hypertonic on the L, L3-5SLRR  Pelvis: posterior R innominate, SI joint restricted on the L  Sacrum: L on R torsion  Abdomen: diaphragm spasm bilaterally L>R   Results for orders placed or performed during the hospital encounter of 04/13/17  Calprotectin, Fecal  Result Value Ref Range   Calprotectin, Fecal 43 0 - 120 ug/g  C difficile quick scan w PCR reflex  Result Value Ref Range   C Diff antigen NEGATIVE NEGATIVE   C Diff toxin NEGATIVE NEGATIVE   C Diff interpretation No C. difficile detected.   Gastrointestinal Panel by PCR , Stool  Result Value Ref Range   Campylobacter species NOT DETECTED NOT DETECTED   Plesimonas shigelloides NOT DETECTED NOT DETECTED   Salmonella species NOT DETECTED NOT DETECTED   Yersinia enterocolitica NOT DETECTED NOT DETECTED   Vibrio species NOT DETECTED NOT DETECTED   Vibrio cholerae NOT DETECTED NOT DETECTED   Enteroaggregative E coli (EAEC) NOT DETECTED NOT DETECTED   Enteropathogenic E coli (EPEC) NOT DETECTED NOT DETECTED    Enterotoxigenic E coli (ETEC) NOT DETECTED NOT DETECTED   Shiga like toxin producing E coli (STEC) NOT DETECTED NOT DETECTED   Shigella/Enteroinvasive E coli (EIEC) NOT DETECTED NOT DETECTED   Cryptosporidium NOT DETECTED NOT DETECTED   Cyclospora cayetanensis NOT DETECTED NOT DETECTED   Entamoeba histolytica NOT DETECTED NOT DETECTED   Giardia lamblia NOT DETECTED NOT DETECTED   Adenovirus F40/41 NOT DETECTED NOT DETECTED   Astrovirus NOT DETECTED NOT DETECTED   Norovirus GI/GII NOT DETECTED NOT DETECTED   Rotavirus A NOT DETECTED NOT DETECTED   Sapovirus (I, II, IV, and V) NOT DETECTED NOT DETECTED  H. pylori antigen, stool  Result Value Ref Range   H. Pylori Stool Ag, Eia Negative Negative      Assessment & Plan:   Problem List Items Addressed This Visit      Other   Chronic pain - Primary    Patient states again that she has to pay $40 to see the pain psychologist, and she cannot afford that. Needs to  go back to see pain management for injections- but has not made that appointment. Advised her to go see them. Patient's pain stable today on current regimen. PMP reviewed today and appropriate. Discussion had with patient again today regarding her pain management, we do short term pain management and were intending to bridge her until she got into pain management. However, it has been 8 months, and patient has not fully established with the local pain management. Discussed that we will give her her Rx this month, and for another 2 months, but then will have to stop. During this time frame, she will need to establish with a pain management doctor. She was again not happy with this situation and did not discuss any options regarding this. Referral to [facility] made to see if there is any way to help with this. Rx given today.      Relevant Medications   oxyCODONE-acetaminophen (PERCOCET) 5-325 MG tablet   Chronic pelvic pain in female    Doing OK today. Stable. Continue current regimen. Treated  today with OMT as below. Call with any concerns.       Relevant Medications   oxyCODONE-acetaminophen (PERCOCET) 5-325 MG tablet    Other Visit Diagnoses    Acute bilateral low back pain without sciatica       Acute exacerbation from working in the garden. Myofascial in nature. Has somatic dysfunction contributing to her symptoms. Treated today as below.    Relevant Medications   oxyCODONE-acetaminophen (PERCOCET) 5-325 MG tablet   Sacral region somatic dysfunction       Pelvic region somatic dysfunction       Rib cage region somatic dysfunction       Somatic dysfunction of spine, lumbar       Segmental dysfunction of abdomen       Thoracic region somatic dysfunction       Head region somatic dysfunction       Somatic dysfunction of cervical region       Financial difficulty       Referral to [facility] made to see if there is anything they can do to help.   Relevant Orders   Ambulatory referral to [facility]      After verbal consent was obtained, patient was treated today with osteopathic manipulative medicine to the regions of the head, neck, thorax, lumbar, pelvis, sacrum and abdomen using the techniques of cranial, myofascial release, counterstrain, muscle energy and soft tissue. Areas of compensation relating to her primary pain source also treated. Patient tolerated the procedure well with fair objective and fair subjective improvement in symptoms. She left the room in good condition. She was advised to stay well hydrated and that she may have some soreness following the procedure. If not improving or worsening, she will call and come in. She will return for reevaluation  in 1-2 months.   Follow up plan: Return in about 4 weeks (around 06/25/2017) for Procedure visit.

## 2017-05-29 ENCOUNTER — Encounter: Payer: Self-pay | Admitting: Family Medicine

## 2017-05-29 NOTE — Assessment & Plan Note (Signed)
Doing OK today. Stable. Continue current regimen. Treated today with OMT as below. Call with any concerns.

## 2017-05-29 NOTE — Assessment & Plan Note (Signed)
Patient states again that she has to pay $40 to see the pain psychologist, and she cannot afford that. Needs to go back to see pain management for injections- but has not made that appointment. Advised her to go see them. Patient's pain stable today on current regimen. PMP reviewed today and appropriate. Discussion had with patient again today regarding her pain management, we do short term pain management and were intending to bridge her until she got into pain management. However, it has been 8 months, and patient has not fully established with the local pain management. Discussed that we will give her her Rx this month, and for another 2 months, but then will have to stop. During this time frame, she will need to establish with a pain management doctor. She was again not happy with this situation and did not discuss any options regarding this. Referral to [facility] made to see if there is any way to help with this. Rx given today.

## 2017-05-31 NOTE — Telephone Encounter (Signed)
Error

## 2017-06-25 ENCOUNTER — Ambulatory Visit (INDEPENDENT_AMBULATORY_CARE_PROVIDER_SITE_OTHER): Payer: Medicare Other | Admitting: Family Medicine

## 2017-06-25 ENCOUNTER — Ambulatory Visit (INDEPENDENT_AMBULATORY_CARE_PROVIDER_SITE_OTHER): Payer: Medicare Other

## 2017-06-25 ENCOUNTER — Encounter: Payer: Self-pay | Admitting: Family Medicine

## 2017-06-25 VITALS — BP 115/71 | HR 61 | Wt 197.0 lb

## 2017-06-25 VITALS — BP 115/71 | HR 61 | Resp 16 | Ht 62.0 in | Wt 197.0 lb

## 2017-06-25 DIAGNOSIS — M9905 Segmental and somatic dysfunction of pelvic region: Secondary | ICD-10-CM | POA: Diagnosis not present

## 2017-06-25 DIAGNOSIS — M9904 Segmental and somatic dysfunction of sacral region: Secondary | ICD-10-CM | POA: Diagnosis not present

## 2017-06-25 DIAGNOSIS — M9903 Segmental and somatic dysfunction of lumbar region: Secondary | ICD-10-CM | POA: Diagnosis not present

## 2017-06-25 DIAGNOSIS — R102 Pelvic and perineal pain: Secondary | ICD-10-CM

## 2017-06-25 DIAGNOSIS — Z0000 Encounter for general adult medical examination without abnormal findings: Secondary | ICD-10-CM

## 2017-06-25 DIAGNOSIS — M9909 Segmental and somatic dysfunction of abdomen and other regions: Secondary | ICD-10-CM | POA: Diagnosis not present

## 2017-06-25 DIAGNOSIS — M9902 Segmental and somatic dysfunction of thoracic region: Secondary | ICD-10-CM

## 2017-06-25 DIAGNOSIS — M9900 Segmental and somatic dysfunction of head region: Secondary | ICD-10-CM | POA: Diagnosis not present

## 2017-06-25 DIAGNOSIS — M545 Low back pain, unspecified: Secondary | ICD-10-CM

## 2017-06-25 DIAGNOSIS — M9908 Segmental and somatic dysfunction of rib cage: Secondary | ICD-10-CM

## 2017-06-25 DIAGNOSIS — M9901 Segmental and somatic dysfunction of cervical region: Secondary | ICD-10-CM

## 2017-06-25 DIAGNOSIS — G8929 Other chronic pain: Secondary | ICD-10-CM

## 2017-06-25 MED ORDER — MELOXICAM 7.5 MG PO TABS: 7.5000 mg | ORAL_TABLET | Freq: Every day | ORAL | 3 refills | Status: DC | PRN

## 2017-06-25 MED ORDER — OXYCODONE-ACETAMINOPHEN 5-325 MG PO TABS: 1.0000 | ORAL_TABLET | Freq: Four times a day (QID) | ORAL | 0 refills | Status: DC | PRN

## 2017-06-25 NOTE — Patient Instructions (Signed)

## 2017-06-25 NOTE — Assessment & Plan Note (Signed)
Patient states again that she now has the $40 to see the pain psychologist, but would like her psychiatrist to write her a letter. She has not asked him to or asked the pain management doctor if that would be allowed. Needs to go back to see pain management for injections- Seeing them on 07/18/17.  Patient's pain exacerbated today on current regimen due to working in her yard. PMP reviewed today and appropriate. Discussion had with patient again today regarding her pain management, we do short term pain management and were intending to bridge her until she got into pain management. However, it has been 9 months, and patient has not fully established with the local pain management. Discussed that we will give her her Rx this month, and for another 1 month, but then will have to stop. During this time frame, she will need to establish with a pain management doctor. Rx given today.

## 2017-06-25 NOTE — Patient Instructions (Signed)
Sharon Rodriguez , Thank you for taking time to come for your Medicare Wellness Visit. I appreciate your ongoing commitment to your health goals. Please review the following plan we discussed and let me know if I can assist you in the future.   Screening recommendations/referrals: Colonoscopy: due at age 39 Mammogram: due at age 34 Bone Density: due at age 39 Recommended yearly ophthalmology/optometry visit for glaucoma screening and checkup Recommended yearly dental visit for hygiene and checkup  Vaccinations: Influenza vaccine: up to date Pneumococcal vaccine: due at age 54 Tdap vaccine: up to date Shingles vaccine: due at age 42     Advanced directives: Advance directive discussed with you today. I have provided a copy for you to complete at home and have notarized. Once this is complete please bring a copy in to our office so we can scan it into your chart.  Conditions/risks identified: Recommend drinking at least 6-8 glasses of water a day   Next appointment: Follow up in one year for your annual wellness exam.    Preventive Care 18-39 Years, Female Preventive care refers to lifestyle choices and visits with your health care provider that can promote health and wellness. What does preventive care include?  A yearly physical exam. This is also called an annual well check.  Dental exams once or twice a year.  Routine eye exams. Ask your health care provider how often you should have your eyes checked.  Personal lifestyle choices, including: ? Daily care of your teeth and gums. ? Regular physical activity. ? Eating a healthy diet. ? Avoiding tobacco and drug use. ? Limiting alcohol use. ? Practicing safe sex. ? Taking vitamin and mineral supplements as recommended by your health care provider. What happens during an annual well check? The services and screenings done by your health care provider during your annual well check will depend on your age, overall health, lifestyle risk  factors, and family history of disease. Counseling Your health care provider may ask you questions about your:  Alcohol use.  Tobacco use.  Drug use.  Emotional well-being.  Home and relationship well-being.  Sexual activity.  Eating habits.  Work and work Statistician.  Method of birth control.  Menstrual cycle.  Pregnancy history.  Screening You may have the following tests or measurements:  Height, weight, and BMI.  Diabetes screening. This is done by checking your blood sugar (glucose) after you have not eaten for a while (fasting).  Blood pressure.  Lipid and cholesterol levels. These may be checked every 5 years starting at age 98.  Skin check.  Hepatitis C blood test.  Hepatitis B blood test.  Sexually transmitted disease (STD) testing.  BRCA-related cancer screening. This may be done if you have a family history of breast, ovarian, tubal, or peritoneal cancers.  Pelvic exam and Pap test. This may be done every 3 years starting at age 71. Starting at age 77, this may be done every 5 years if you have a Pap test in combination with an HPV test.  Discuss your test results, treatment options, and if necessary, the need for more tests with your health care provider. Vaccines Your health care provider may recommend certain vaccines, such as:  Influenza vaccine. This is recommended every year.  Tetanus, diphtheria, and acellular pertussis (Tdap, Td) vaccine. You may need a Td booster every 10 years.  Varicella vaccine. You may need this if you have not been vaccinated.  HPV vaccine. If you are 20 or younger, you may  need three doses over 6 months.  Measles, mumps, and rubella (MMR) vaccine. You may need at least one dose of MMR. You may also need a second dose.  Pneumococcal 13-valent conjugate (PCV13) vaccine. You may need this if you have certain conditions and were not previously vaccinated.  Pneumococcal polysaccharide (PPSV23) vaccine. You may  need one or two doses if you smoke cigarettes or if you have certain conditions.  Meningococcal vaccine. One dose is recommended if you are age 41-21 years and a first-year college student living in a residence hall, or if you have one of several medical conditions. You may also need additional booster doses.  Hepatitis A vaccine. You may need this if you have certain conditions or if you travel or work in places where you may be exposed to hepatitis A.  Hepatitis B vaccine. You may need this if you have certain conditions or if you travel or work in places where you may be exposed to hepatitis B.  Haemophilus influenzae type b (Hib) vaccine. You may need this if you have certain risk factors.  Talk to your health care provider about which screenings and vaccines you need and how often you need them. This information is not intended to replace advice given to you by your health care provider. Make sure you discuss any questions you have with your health care provider. Document Released: 04/25/2001 Document Revised: 11/17/2015 Document Reviewed: 12/29/2014 Elsevier Interactive Patient Education  [employer].

## 2017-06-25 NOTE — Progress Notes (Signed)
BP 115/71   Pulse 61   Wt 197 lb (89.4 kg)   SpO2 98%   BMI 37.22 kg/m    Subjective:    Patient ID: Sharon Rodriguez, female    DOB: February 19, 1981, 37 y.o.   MRN: [id number]  HPI: Sharon Rodriguez is a 37 y.o. female  Chief Complaint  Patient presents with  . Pain   [name] presents today for evaluation and possible treatment with OMT for her chronic low back pain and pelvic pain. She notes that she has been working in her garden and her back starts to throb and she can only work for about 5 minutes. She notes that her pain is in her low back. It's throbbing and sharp in nature. Better with rest, worse with activity. Percocet seems to make it worse. Pain not radiating.  Had injections in the past and they seem like they helped, but they have worn off now. Going back to see pain management on 07/18/17. She has not seen the pain psychologist. Now has the money to go to see the pain psychologist, but is still hoping that her psychiatrist would be will to write her a letter. She has not reached out to him about this. Her pelvic pain is about the same. Chronic and aching. Worse with walking and better with rest. OMT seems to help, and she would like to continue with it today.   CHRONIC PAIN  Present dose: 62.5  Morphine equivalents Pain control status: stable Duration: chronic Location: wide spread Quality: tight, throbbing, aching Current Pain Level: 9/10 Previous Pain Level: severe Breakthrough pain: yes Benefit from narcotic medications: yes What Activities task can be accomplished with current medication?: able to take care of her kids Interested in weaning off narcotics:no   Stool softners/OTC fiber: yes  Previous pain specialty evaluation: yes Non-narcotic analgesic meds: not currently, was on gabapentin, now refuses it Narcotic contract: yes   Otherwise doing well with no other concerns.   Relevant past medical, surgical, family and social history reviewed and updated as  indicated. Interim medical history since our last visit reviewed. Allergies and medications reviewed and updated.  Review of Systems  Constitutional: Negative.   Respiratory: Negative.   Cardiovascular: Negative.   Musculoskeletal: Positive for arthralgias, back pain, gait problem and myalgias. Negative for joint swelling, neck pain and neck stiffness.  Skin: Negative.   Neurological: Negative for dizziness, tremors, seizures, syncope, facial asymmetry, speech difficulty, weakness, light-headedness, numbness and headaches.  Psychiatric/Behavioral: Negative.     Per HPI unless specifically indicated above     Objective:    BP 115/71   Pulse 61   Wt 197 lb (89.4 kg)   SpO2 98%   BMI 37.22 kg/m   Wt Readings from Last 3 Encounters:  06/25/17 197 lb (89.4 kg)  06/25/17 197 lb (89.4 kg)  05/28/17 197 lb (89.4 kg)    Physical Exam  Constitutional: She is oriented to person, place, and time. She appears well-developed and well-nourished. No distress.  HENT:  Head: Normocephalic and atraumatic.  Right Ear: Hearing normal.  Left Ear: Hearing normal.  Nose: Nose normal.  Eyes: Conjunctivae and lids are normal. Right eye exhibits no discharge. Left eye exhibits no discharge. No scleral icterus.  Pulmonary/Chest: Effort normal. No respiratory distress.  Abdominal: Soft. She exhibits no distension and no mass. There is no tenderness. There is no rebound and no guarding. No hernia.  Neurological: She is alert and oriented to person, place, and time.  Skin: Skin is warm, dry and intact. Capillary refill takes less than 2 seconds. No rash noted. She is not diaphoretic. No erythema. No pallor.  Psychiatric: She has a normal mood and affect. Her speech is normal and behavior is normal. Judgment and thought content normal. Cognition and memory are normal.  Nursing note and vitals reviewed. Musculoskeletal:  Exam found Decreased ROM, Tissue texture changes, Tenderness to palpation and  Asymmetry of patient's  head, neck, thorax, ribs, lumbar, pelvis, sacrum and abdomen Osteopathic Structural Exam:   Head: hypertonic suboccipital muscles  Neck: trap spasm bilaterally   Thorax: T3-5 SLRR  Ribs: RIbs 6-9 locked up on the R, ribe 5-8 locked up on the L  Lumbar: QL and psoas hypertonic on the L, L4-5SLRR  Pelvis: Posterior L innominate, SI joint restricted on the L  Sacrum: R on R torsion, SI joint restricted on the L  Abdomen: diaphragm spasm bilaterally L>R, pelvic diaphragm restricted and pulled into L hip    Results for orders placed or performed during the hospital encounter of 04/13/17  Calprotectin, Fecal  Result Value Ref Range   Calprotectin, Fecal 43 0 - 120 ug/g  C difficile quick scan w PCR reflex  Result Value Ref Range   C Diff antigen NEGATIVE NEGATIVE   C Diff toxin NEGATIVE NEGATIVE   C Diff interpretation No C. difficile detected.   Gastrointestinal Panel by PCR , Stool  Result Value Ref Range   Campylobacter species NOT DETECTED NOT DETECTED   Plesimonas shigelloides NOT DETECTED NOT DETECTED   Salmonella species NOT DETECTED NOT DETECTED   Yersinia enterocolitica NOT DETECTED NOT DETECTED   Vibrio species NOT DETECTED NOT DETECTED   Vibrio cholerae NOT DETECTED NOT DETECTED   Enteroaggregative E coli (EAEC) NOT DETECTED NOT DETECTED   Enteropathogenic E coli (EPEC) NOT DETECTED NOT DETECTED   Enterotoxigenic E coli (ETEC) NOT DETECTED NOT DETECTED   Shiga like toxin producing E coli (STEC) NOT DETECTED NOT DETECTED   Shigella/Enteroinvasive E coli (EIEC) NOT DETECTED NOT DETECTED   Cryptosporidium NOT DETECTED NOT DETECTED   Cyclospora cayetanensis NOT DETECTED NOT DETECTED   Entamoeba histolytica NOT DETECTED NOT DETECTED   Giardia lamblia NOT DETECTED NOT DETECTED   Adenovirus F40/41 NOT DETECTED NOT DETECTED   Astrovirus NOT DETECTED NOT DETECTED   Norovirus GI/GII NOT DETECTED NOT DETECTED   Rotavirus A NOT DETECTED NOT DETECTED    Sapovirus (I, II, IV, and V) NOT DETECTED NOT DETECTED  H. pylori antigen, stool  Result Value Ref Range   H. Pylori Stool Ag, Eia Negative Negative      Assessment & Plan:   Problem List Items Addressed This Visit      Other   Chronic pain - Primary    Patient states again that she now has the $40 to see the pain psychologist, but would like her psychiatrist to write her a letter. She has not asked him to or asked the pain management doctor if that would be allowed. Needs to go back to see pain management for injections- Seeing them on 07/18/17.  Patient's pain exacerbated today on current regimen due to working in her yard. PMP reviewed today and appropriate. Discussion had with patient again today regarding her pain management, we do short term pain management and were intending to bridge her until she got into pain management. However, it has been 9 months, and patient has not fully established with the local pain management. Discussed that we will give her her  Rx this month, and for another 1 month, but then will have to stop. During this time frame, she will need to establish with a pain management doctor. Rx given today.      Relevant Medications   meloxicam (MOBIC) 7.5 MG tablet   oxyCODONE-acetaminophen (PERCOCET) 5-325 MG tablet   Chronic pelvic pain in female    Doing OK today. Stable. Continue current regimen. Treated today with OMT as below. Call with any concerns.       Relevant Medications   meloxicam (MOBIC) 7.5 MG tablet   oxyCODONE-acetaminophen (PERCOCET) 5-325 MG tablet    Other Visit Diagnoses    Acute bilateral low back pain without sciatica       In acute exacerbation due to working in her yard. Appears to have somatic dysfunction that would benefit from OMT. Treated today as below.    Relevant Medications   meloxicam (MOBIC) 7.5 MG tablet   oxyCODONE-acetaminophen (PERCOCET) 5-325 MG tablet   Sacral region somatic dysfunction       Pelvic region somatic  dysfunction       Rib cage region somatic dysfunction       Somatic dysfunction of spine, lumbar       Segmental dysfunction of abdomen       Thoracic region somatic dysfunction       Head region somatic dysfunction       Somatic dysfunction of cervical region         After verbal consent was obtained, patient was treated today with osteopathic manipulative medicine to the regions of the head, neck, thorax, ribs, lumbar, pelvis, sacrum and abdomen using the techniques of cranial, myofascial release, counterstrain, muscle energy, HVLA and soft tissue. Areas of compensation relating to her primary pain source also treated. Patient tolerated the procedure well with good objective and good subjective improvement in symptoms. She left the room in good condition. She was advised to stay well hydrated and that she may have some soreness following the procedure. If not improving or worsening, she will call and come in. Home exercise program of stretches for lumbar discussed and demonstrated today. Patient will do these stretches BID to before the point of pain, and will return for reevaluation  In 3-4 weeks.   Follow up plan: Return in about 1 month (around 07/23/2017) for Procedure visit.

## 2017-06-25 NOTE — Progress Notes (Signed)
Subjective:   Sharon Rodriguez is a 37 y.o. female who presents for an Initial Medicare Annual Wellness Visit.  Review of Systems      Cardiac Risk Factors include: obesity (BMI >30kg/m2)     Objective:    Today's Vitals   06/25/17 1324 06/25/17 1328  BP: 115/71   Pulse: 61   Resp: 16   Weight: 197 lb (89.4 kg)   Height: 5\' 2"  (1.575 m)   PainSc:  4    Body mass index is 36.03 kg/m.  Advanced Directives 06/25/2017 03/19/2017 12/18/2016 12/05/2016 07/02/2015 06/28/2015  Does Patient Have a Medical Advance Directive? No No No No No No  Would patient like information on creating a medical advance directive? Yes (MAU/Ambulatory/Procedural Areas - Information given) - - - Yes - Educational materials given Yes - Educational materials given    Current Medications (verified) Outpatient Encounter Medications as of 06/25/2017  Medication Sig  . 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 levothyroxine (SYNTHROID, LEVOTHROID) 50 MCG tablet Take 1 tablet (50 mcg total) by mouth daily before breakfast. NAME BRAND ONLY PLEASE  . meloxicam (MOBIC) 7.5 MG tablet Take 1 tablet (7.5 mg total) by mouth daily as needed for pain.  Marland Kitchen MYDAYIS 25 MG CP24   . oxyCODONE-acetaminophen (PERCOCET) 5-325 MG tablet Take 1-2 tablets by mouth every 6 (six) hours as needed for severe pain.  . Vitamin D, Ergocalciferol, (DRISDOL) 50000 units CAPS capsule Take 1 capsule (50,000 Units total) by mouth every 7 (seven) days.  Marland Kitchen dicyclomine (BENTYL) 10 MG capsule Take 1 capsule (10 mg total) by mouth 4 (four) times daily -  before meals and at bedtime for 14 days.  Marland Kitchen omeprazole (PRILOSEC) 40 MG capsule Take 1 capsule (40 mg total) by mouth daily. (Patient not taking: Reported on 06/25/2017)   No facility-administered encounter medications on file as of 06/25/2017.     Allergies (verified) Ibuprofen and Promethazine    History: Past Medical History:  Diagnosis Date  . ADD (attention deficit disorder)   . Anxiety   . Chronic bronchitis ([place])   . Chronic fatigue   . Chronic pain   . Depression   . Fibromyalgia   . Hypothyroidism 01/27/2015  . Learning disability   . Osteoarthritis   . Rheumatoid arthritis ([place])   . Sleep apnea   . Thyroid disease   . TMJ (dislocation of temporomandibular joint)    Past Surgical History:  Procedure Laterality Date  . CESAREAN SECTION     2   Family History  Problem Relation Age of Onset  . Diabetes Mother   . ADD / ADHD Mother   . Learning disabilities Mother   . COPD Mother   . Diabetes Father   . Hyperlipidemia Father   . Mental illness Father        Depression/anxiety  . Mental illness Sister        Depression/Anxiety  . Arthritis Brother        Rheumatoid  . Mental illness Daughter        anxiety  . Thyroid disease Daughter   . ADD / ADHD Daughter   . Allergies Daughter   . Cancer Maternal Grandmother        Skin, Lung  . Heart disease Maternal Grandfather   . Stroke Maternal Grandfather   . Heart attack Maternal Grandfather   . Arthritis Paternal Grandmother  Rheumatoid  . Dementia Paternal Grandmother   . Allergies Daughter    Social History   Socioeconomic History  . Marital status: Married    Spouse name: Not on file  . Number of children: Not on file  . Years of education: Not on file  . Highest education level: Not on file  Occupational History  . Not on file  Social Needs  . Financial resource strain: Hard  . Food insecurity:    Worry: Sometimes true    Inability: Never true  . Transportation needs:    Medical: Yes    Non-medical: Yes  Tobacco Use  . Smoking status: Never Smoker  . Smokeless tobacco: Never Used  Substance and Sexual Activity  . Alcohol use: No  . Drug use: No  . Sexual activity: Not Currently    Birth control/protection: None  Lifestyle  . Physical activity:    Days per week: 0 days     Minutes per session: 0 min  . Stress: Not at all  Relationships  . Social connections:    Talks on phone: Never    Gets together: Once a week    Attends religious service: More than 4 times per year    Active member of club or organization: Yes    Attends meetings of clubs or organizations: More than 4 times per year    Relationship status: Married  Other Topics Concern  . Not on file  Social History Narrative  . Not on file    Tobacco Counseling Counseling given: Not Answered   Clinical Intake:  Pre-visit preparation completed: Yes  Pain : 0-10 Pain Score: 4  Pain Type: Chronic pain Pain Location: Back Pain Orientation: Lower Pain Descriptors / Indicators: Aching Pain Onset: More than a month ago Pain Frequency: Constant     Nutritional Status: BMI > 30  Obese Nutritional Risks: Nausea/ vomitting/ diarrhea(nausea ) Diabetes: No  How often do you need to have someone help you when you read instructions, pamphlets, or other written materials from your doctor or pharmacy?: 1 - Never What is the last grade level you completed in school?: 12th grade  Interpreter Needed?: No  Information entered by :: Nyesha Cliff,LPN    Activities of Daily Living In your present state of health, do you have any difficulty performing the following activities: 06/25/2017 07/06/2016  Hearing? N N  Vision? N Y  Difficulty concentrating or making decisions? Tempie Donning  Walking or climbing stairs? Y Y  Comment pain -  Dressing or bathing? N N  Doing errands, shopping? Y Y  Comment - Fibromyalgia  Preparing Food and eating ? N -  Using the Toilet? N -  In the past six months, have you accidently leaked urine? Y -  Comment pads- leakage -  Do you have problems with loss of bowel control? N -  Managing your Medications? N -  Managing your Finances? N -  Housekeeping or managing your Housekeeping? Y -  Comment husband manages  -  Some recent data might be hidden     Immunizations  and Health Maintenance Immunization History  Administered Date(s) Administered  . Influenza,inj,Quad PF,6+ Mos 01/05/2016, 12/05/2016  . Influenza-Unspecified 01/05/2016  . Tdap 01/05/2016   There are no preventive care reminders to display for this patient.  Patient Care Team: Valerie Roys, DO as PCP - General (Family Medicine) Lennox Laity, CRNP as Nurse Practitioner (Rheumatology) Myer Haff, MD as Referring Physician (Psychiatry)  Indicate any recent Medical Services you  may have received from other than [facility] providers in the past year (date may be approximate).     Assessment:   This is a routine wellness examination for [place].  Hearing/Vision screen Vision Screening Comments: Doesn't have current eye doctor  Dietary issues and exercise activities discussed: Current Exercise Habits: The patient does not participate in regular exercise at present, Exercise limited by: orthopedic condition(s)  Goals    . DIET - INCREASE WATER INTAKE     Recommend drinking at least 6-8 glasses of water a day       Depression Screen PHQ 2/9 Scores 06/25/2017 12/15/2016 12/05/2016 10/03/2016 03/20/2016 01/27/2015  PHQ - 2 Score 3 5 0 4 5 4   PHQ- 9 Score 15 20 - 18 19 16     Fall Risk Fall Risk  06/25/2017 06/25/2017 05/28/2017 12/18/2016 12/15/2016  Falls in the past year? Yes No No No Yes  Number falls in past yr: 2 or more - - - 2 or more  Injury with Fall? Yes - - - Yes  Risk Factor Category  High Fall Risk - - - -  Risk for fall due to : - - - - Impaired balance/gait;Medication side effect  Follow up Falls prevention discussed - - - Education provided    Is the patient's home free of loose throw rugs in walkways, pet beds, electrical cords, etc?   yes      Grab bars in the bathroom? no      Handrails on the stairs?   no stairs      Adequate lighting?   yes  Timed Get Up and Go Performed Completed in 8 seconds with no use of assistive devices, steady gait. No intervention needed  at this time.   Cognitive Function:     6CIT Screen 06/25/2017  What Year? 0 points  What month? 0 points  What time? 0 points  Count back from 20 0 points  Months in reverse 0 points  Repeat phrase 2 points  Total Score 2    Screening Tests Health Maintenance  Topic Date Due  . INFLUENZA VACCINE  10/11/2017  . PAP SMEAR  03/28/2020  . TETANUS/TDAP  01/04/2026  . HIV Screening  Addressed    Qualifies for Shingles Vaccine? No   Cancer Screenings: Lung: Low Dose CT Chest recommended if Age 6-80 years, 30 pack-year currently smoking OR have quit w/in 15years. Patient does not qualify. Breast: Up to date on Mammogram? Yes  Not due  Up to date of Bone Density/Dexa? Yes not due  Colorectal: not due   Additional Screenings:  Hepatitis C Screening: not indicated      Plan:    I have personally reviewed and addressed the Medicare Annual Wellness questionnaire and have noted the following in the patient's chart:  A. Medical and social history B. Use of alcohol, tobacco or illicit drugs  C. Current medications and supplements D. Functional ability and status E.  Nutritional status F.  Physical activity G. Advance directives H. List of other physicians I.  Hospitalizations, surgeries, and ER visits in previous 12 months J.  [place] such as hearing and vision if needed, cognitive and depression L. Referrals and appointments   In addition, I have reviewed and discussed with patient certain preventive protocols, quality metrics, and best practice recommendations. A written personalized care plan for preventive services as well as general preventive health recommendations were provided to patient.   Signed,  Tyler Aas, LPN Nurse Health Advisor  Nurse Notes:none

## 2017-06-25 NOTE — Assessment & Plan Note (Signed)
Doing OK today. Stable. Continue current regimen. Treated today with OMT as below. Call with any concerns.

## 2017-07-19 ENCOUNTER — Ambulatory Visit
Payer: Medicare Other | Attending: Student in an Organized Health Care Education/Training Program | Admitting: Student in an Organized Health Care Education/Training Program

## 2017-07-19 ENCOUNTER — Other Ambulatory Visit: Payer: Self-pay

## 2017-07-19 ENCOUNTER — Encounter: Payer: Self-pay | Admitting: Student in an Organized Health Care Education/Training Program

## 2017-07-19 VITALS — BP 120/70 | HR 95 | Temp 98.8°F | Resp 16 | Ht 61.0 in | Wt 197.0 lb

## 2017-07-19 DIAGNOSIS — F419 Anxiety disorder, unspecified: Secondary | ICD-10-CM | POA: Diagnosis not present

## 2017-07-19 DIAGNOSIS — F329 Major depressive disorder, single episode, unspecified: Secondary | ICD-10-CM | POA: Diagnosis not present

## 2017-07-19 DIAGNOSIS — M545 Low back pain: Secondary | ICD-10-CM | POA: Insufficient documentation

## 2017-07-19 DIAGNOSIS — G894 Chronic pain syndrome: Secondary | ICD-10-CM

## 2017-07-19 DIAGNOSIS — M7918 Myalgia, other site: Secondary | ICD-10-CM

## 2017-07-19 DIAGNOSIS — M069 Rheumatoid arthritis, unspecified: Secondary | ICD-10-CM

## 2017-07-19 DIAGNOSIS — M47816 Spondylosis without myelopathy or radiculopathy, lumbar region: Secondary | ICD-10-CM

## 2017-07-19 DIAGNOSIS — E559 Vitamin D deficiency, unspecified: Secondary | ICD-10-CM | POA: Diagnosis not present

## 2017-07-19 DIAGNOSIS — K760 Fatty (change of) liver, not elsewhere classified: Secondary | ICD-10-CM | POA: Insufficient documentation

## 2017-07-19 DIAGNOSIS — R5382 Chronic fatigue, unspecified: Secondary | ICD-10-CM | POA: Diagnosis not present

## 2017-07-19 DIAGNOSIS — E039 Hypothyroidism, unspecified: Secondary | ICD-10-CM | POA: Diagnosis not present

## 2017-07-19 DIAGNOSIS — R102 Pelvic and perineal pain: Secondary | ICD-10-CM | POA: Diagnosis not present

## 2017-07-19 DIAGNOSIS — M26629 Arthralgia of temporomandibular joint, unspecified side: Secondary | ICD-10-CM

## 2017-07-19 DIAGNOSIS — M797 Fibromyalgia: Secondary | ICD-10-CM | POA: Diagnosis not present

## 2017-07-19 MED ORDER — DICLOFENAC SODIUM 75 MG PO TBEC: 75.0000 mg | DELAYED_RELEASE_TABLET | Freq: Two times a day (BID) | ORAL | 0 refills | Status: DC

## 2017-07-19 NOTE — Progress Notes (Signed)
Safety precautions to be maintained throughout the outpatient stay will include: orient to surroundings, keep bed in low position, maintain call bell within reach at all times, provide assistance with transfer out of bed and ambulation.  

## 2017-07-19 NOTE — Patient Instructions (Addendum)
Rx for Voltaren has been escribed to your pharmacy. ____________________________________________________________________________________________  Preparing for Procedure with Sedation  Instructions: . Oral Intake: Do not eat or drink anything for at least 8 hours prior to your procedure. . Transportation: Public transportation is not allowed. Bring an adult driver. The driver must be physically present in our waiting room before any procedure can be started. Marland Kitchen Physical Assistance: Bring an adult physically capable of assisting you, in the event you need help. This adult should keep you company at home for at least 6 hours after the procedure. . Blood Pressure Medicine: Take your blood pressure medicine with a sip of water the morning of the procedure. . Blood thinners:  . Diabetics on insulin: Notify the staff so that you can be scheduled 1st case in the morning. If your diabetes requires high dose insulin, take only  of your normal insulin dose the morning of the procedure and notify the staff that you have done so. . Preventing infections: Shower with an antibacterial soap the morning of your procedure. . Build-up your immune system: Take 1000 mg of Vitamin C with every meal (3 times a day) the day prior to your procedure. Marland Kitchen Antibiotics: Inform the staff if you have a condition or reason that requires you to take antibiotics before dental procedures. . Pregnancy: If you are pregnant, call and cancel the procedure. . Sickness: If you have a cold, fever, or any active infections, call and cancel the procedure. . Arrival: You must be in the facility at least 30 minutes prior to your scheduled procedure. . Children: Do not bring children with you. . Dress appropriately: Bring dark clothing that you would not mind if they get stained. . Valuables: Do not bring any jewelry or valuables.  Procedure appointments are reserved for interventional treatments only. Marland Kitchen No Prescription Refills. . No  medication changes will be discussed during procedure appointments. . No disability issues will be discussed.  Remember:  Regular Business hours are:  Monday to Thursday 8:00 AM to 4:00 PM  Provider's Schedule: Milinda Pointer, MD:  Procedure days: Tuesday and Thursday 7:30 AM to 4:00 PM  Gillis Santa, MD:  Procedure days: Monday and Wednesday 7:30 AM to 4:00 PM ____________________________________________________________________________________________  GENERAL RISKS AND COMPLICATIONS  What are the risk, side effects and possible complications? Generally speaking, most procedures are safe.  However, with any procedure there are risks, side effects, and the possibility of complications.  The risks and complications are dependent upon the sites that are lesioned, or the type of nerve block to be performed.  The closer the procedure is to the spine, the more serious the risks are.  Great care is taken when placing the radio frequency needles, block needles or lesioning probes, but sometimes complications can occur. 1. Infection: Any time there is an injection through the skin, there is a risk of infection.  This is why sterile conditions are used for these blocks.  There are four possible types of infection. 1. Localized skin infection. 2. Central Nervous System Infection-This can be in the form of Meningitis, which can be deadly. 3. Epidural Infections-This can be in the form of an epidural abscess, which can cause pressure inside of the spine, causing compression of the spinal cord with subsequent paralysis. This would require an emergency surgery to decompress, and there are no guarantees that the patient would recover from the paralysis. 4. Discitis-This is an infection of the intervertebral discs.  It occurs in about 1% of discography procedures.  It is difficult to treat and it may lead to surgery.        2. Pain: the needles have to go through skin and soft tissues, will cause  soreness.       3. Damage to internal structures:  The nerves to be lesioned may be near blood vessels or    other nerves which can be potentially damaged.       4. Bleeding: Bleeding is more common if the patient is taking blood thinners such as  aspirin, Coumadin, Ticiid, Plavix, etc., or if he/she have some genetic predisposition  such as hemophilia. Bleeding into the spinal canal can cause compression of the spinal  cord with subsequent paralysis.  This would require an emergency surgery to  decompress and there are no guarantees that the patient would recover from the  paralysis.       5. Pneumothorax:  Puncturing of a lung is a possibility, every time a needle is introduced in  the area of the chest or upper back.  Pneumothorax refers to free air around the  collapsed lung(s), inside of the thoracic cavity (chest cavity).  Another two possible  complications related to a similar event would include: Hemothorax and Chylothorax.   These are variations of the Pneumothorax, where instead of air around the collapsed  lung(s), you may have blood or chyle, respectively.       6. Spinal headaches: They may occur with any procedures in the area of the spine.       7. Persistent CSF (Cerebro-Spinal Fluid) leakage: This is a rare problem, but may occur  with prolonged intrathecal or epidural catheters either due to the formation of a fistulous  track or a dural tear.       8. Nerve damage: By working so close to the spinal cord, there is always a possibility of  nerve damage, which could be as serious as a permanent spinal cord injury with  paralysis.       9. Death:  Although rare, severe deadly allergic reactions known as "Anaphylactic  reaction" can occur to any of the medications used.      10. Worsening of the symptoms:  We can always make thing worse.  What are the chances of something like this happening? Chances of any of this occuring are extremely low.  By statistics, you have more of a chance of  getting killed in a motor vehicle accident: while driving to the hospital than any of the above occurring .  Nevertheless, you should be aware that they are possibilities.  In general, it is similar to taking a shower.  Everybody knows that you can slip, hit your head and get killed.  Does that mean that you should not shower again?  Nevertheless always keep in mind that statistics do not mean anything if you happen to be on the wrong side of them.  Even if a procedure has a 1 (one) in a 1,000,000 (million) chance of going wrong, it you happen to be that one..Also, keep in mind that by statistics, you have more of a chance of having something go wrong when taking medications.  Who should not have this procedure? If you are on a blood thinning medication (e.g. Coumadin, Plavix, see list of "Blood Thinners"), or if you have an active infection going on, you should not have the procedure.  If you are taking any blood thinners, please inform your physician.  How should I prepare for this procedure?  Do not eat or drink anything at least six hours prior to the procedure.  Bring a driver with you .  It cannot be a taxi.  Come accompanied by an adult that can drive you back, and that is strong enough to help you if your legs get weak or numb from the local anesthetic.  Take all of your medicines the morning of the procedure with just enough water to swallow them.  If you have diabetes, make sure that you are scheduled to have your procedure done first thing in the morning, whenever possible.  If you have diabetes, take only half of your insulin dose and notify our nurse that you have done so as soon as you arrive at the clinic.  If you are diabetic, but only take blood sugar pills (oral hypoglycemic), then do not take them on the morning of your procedure.  You may take them after you have had the procedure.  Do not take aspirin or any aspirin-containing medications, at least eleven (11) days prior to  the procedure.  They may prolong bleeding.  Wear loose fitting clothing that may be easy to take off and that you would not mind if it got stained with Betadine or blood.  Do not wear any jewelry or perfume  Remove any nail coloring.  It will interfere with some of our monitoring equipment.  NOTE: Remember that this is not meant to be interpreted as a complete list of all possible complications.  Unforeseen problems may occur.  BLOOD THINNERS The following drugs contain aspirin or other products, which can cause increased bleeding during surgery and should not be taken for 2 weeks prior to and 1 week after surgery.  If you should need take something for relief of minor pain, you may take acetaminophen which is found in Tylenol,m Datril, Anacin-3 and Panadol. It is not blood thinner. The products listed below are.  Do not take any of the products listed below in addition to any listed on your instruction sheet.  A.P.C or A.P.C with Codeine Codeine Phosphate Capsules #3 Ibuprofen Ridaura  ABC compound Congesprin Imuran rimadil  Advil Cope Indocin Robaxisal  Alka-Seltzer Effervescent Pain Reliever and Antacid Coricidin or Coricidin-D  Indomethacin Rufen  Alka-Seltzer plus Cold Medicine Cosprin Ketoprofen S-A-C Tablets  Anacin Analgesic Tablets or Capsules Coumadin Korlgesic Salflex  Anacin Extra Strength Analgesic tablets or capsules CP-2 Tablets Lanoril Salicylate  Anaprox Cuprimine Capsules Levenox Salocol  Anexsia-D Dalteparin Magan Salsalate  Anodynos Darvon compound Magnesium Salicylate Sine-off  Ansaid Dasin Capsules Magsal Sodium Salicylate  Anturane Depen Capsules Marnal Soma  APF Arthritis pain formula Dewitt's Pills Measurin Stanback  Argesic Dia-Gesic Meclofenamic Sulfinpyrazone  Arthritis Bayer Timed Release Aspirin Diclofenac Meclomen Sulindac  Arthritis pain formula Anacin Dicumarol Medipren Supac  Analgesic (Safety coated) Arthralgen Diffunasal Mefanamic Suprofen  Arthritis  Strength Bufferin Dihydrocodeine Mepro Compound Suprol  Arthropan liquid Dopirydamole Methcarbomol with Aspirin Synalgos  ASA tablets/Enseals Disalcid Micrainin Tagament  Ascriptin Doan's Midol Talwin  Ascriptin A/D Dolene Mobidin Tanderil  Ascriptin Extra Strength Dolobid Moblgesic Ticlid  Ascriptin with Codeine Doloprin or Doloprin with Codeine Momentum Tolectin  Asperbuf Duoprin Mono-gesic Trendar  Aspergum Duradyne Motrin or Motrin IB Triminicin  Aspirin plain, buffered or enteric coated Durasal Myochrisine Trigesic  Aspirin Suppositories Easprin Nalfon Trillsate  Aspirin with Codeine Ecotrin Regular or Extra Strength Naprosyn Uracel  Atromid-S Efficin Naproxen Ursinus  Auranofin Capsules Elmiron Neocylate Vanquish  Axotal Emagrin Norgesic Verin  Azathioprine Empirin or Empirin with Codeine Normiflo Vitamin E  Azolid Emprazil Nuprin Voltaren  Bayer Aspirin plain, buffered or children's or timed BC Tablets or powders Encaprin Orgaran Warfarin Sodium  Buff-a-Comp Enoxaparin Orudis Zorpin  Buff-a-Comp with Codeine Equegesic Os-Cal-Gesic   Buffaprin Excedrin plain, buffered or Extra Strength Oxalid   Bufferin Arthritis Strength Feldene Oxphenbutazone   Bufferin plain or Extra Strength Feldene Capsules Oxycodone with Aspirin   Bufferin with Codeine Fenoprofen Fenoprofen Pabalate or Pabalate-SF   Buffets II Flogesic Panagesic   Buffinol plain or Extra Strength Florinal or Florinal with Codeine Panwarfarin   Buf-Tabs Flurbiprofen Penicillamine   Butalbital Compound Four-way cold tablets Penicillin   Butazolidin Fragmin Pepto-Bismol   Carbenicillin Geminisyn Percodan   Carna Arthritis Reliever Geopen Persantine   Carprofen Gold's salt Persistin   Chloramphenicol Goody's Phenylbutazone   Chloromycetin Haltrain Piroxlcam   Clmetidine heparin Plaquenil   Cllnoril Hyco-pap Ponstel   Clofibrate Hydroxy chloroquine Propoxyphen         Before stopping any of these medications, be sure to  consult the physician who ordered them.  Some, such as Coumadin (Warfarin) are ordered to prevent or treat serious conditions such as "deep thrombosis", "pumonary embolisms", and other heart problems.  The amount of time that you may need off of the medication may also vary with the medication and the reason for which you were taking it.  If you are taking any of these medications, please make sure you notify your pain physician before you undergo any procedures.         Facet Blocks Patient Information  Description: The facets are joints in the spine between the vertebrae.  Like any joints in the body, facets can become irritated and painful.  Arthritis can also effect the facets.  By injecting steroids and local anesthetic in and around these joints, we can temporarily block the nerve supply to them.  Steroids act directly on irritated nerves and tissues to reduce selling and inflammation which often leads to decreased pain.  Facet blocks may be done anywhere along the spine from the neck to the low back depending upon the location of your pain.   After numbing the skin with local anesthetic (like Novocaine), a small needle is passed onto the facet joints under x-ray guidance.  You may experience a sensation of pressure while this is being done.  The entire block usually lasts about 15-25 minutes.   Conditions which may be treated by facet blocks:   Low back/buttock pain  Neck/shoulder pain  Certain types of headaches  Preparation for the injection:  1. Do not eat any solid food or dairy products within 8 hours of your appointment. 2. You may drink clear liquid up to 3 hours before appointment.  Clear liquids include water, black coffee, juice or soda.  No milk or cream please. 3. You may take your regular medication, including pain medications, with a sip of water before your appointment.  Diabetics should hold regular insulin (if taken separately) and take 1/2 normal NPH dose the  morning of the procedure.  Carry some sugar containing items with you to your appointment. 4. A driver must accompany you and be prepared to drive you home after your procedure. 5. Bring all your current medications with you. 6. An IV may be inserted and sedation may be given at the discretion of the physician. 7. A blood pressure cuff, EKG and other monitors will often be applied during the procedure.  Some patients may need to have extra oxygen administered for a short period. 8. You  will be asked to provide medical information, including your allergies and medications, prior to the procedure.  We must know immediately if you are taking blood thinners (like Coumadin/Warfarin) or if you are allergic to IV iodine contrast (dye).  We must know if you could possible be pregnant.  Possible side-effects:   Bleeding from needle site  Infection (rare, may require surgery)  Nerve injury (rare)  Numbness & tingling (temporary)  Difficulty urinating (rare, temporary)  Spinal headache (a headache worse with upright posture)  Light-headedness (temporary)  Pain at injection site (serveral days)  Decreased blood pressure (rare, temporary)  Weakness in arm/leg (temporary)  Pressure sensation in back/neck (temporary)   Call if you experience:   Fever/chills associated with headache or increased back/neck pain  Headache worsened by an upright position  New onset, weakness or numbness of an extremity below the injection site  Hives or difficulty breathing (go to the emergency room)  Inflammation or drainage at the injection site(s)  Severe back/neck pain greater than usual  New symptoms which are concerning to you  Please note:  Although the local anesthetic injected can often make your back or neck feel good for several hours after the injection, the pain will likely return. It takes 3-7 days for steroids to work.  You may not notice any pain relief for at least one week.  If  effective, we will often do a series of 2-3 injections spaced 3-6 weeks apart to maximally decrease your pain.  After the initial series, you may be a candidate for a more permanent nerve block of the facets.  If you have any questions, please call #[phone]) [facility]

## 2017-07-19 NOTE — Progress Notes (Signed)
Patient's Name: Sharon Rodriguez  MRN: [id number]  Referring Provider: Valerie Roys, DO  DOB: 06/07/1980  PCP: Valerie Roys, DO  DOS: 07/19/2017  Note by: Gillis Santa, MD  Service setting: Ambulatory outpatient  Specialty: Interventional Pain Management  Location: [facility] (AMB) Pain Management Facility    Patient type: Established   Primary Reason(s) for Visit: Evaluation of chronic illnesses with exacerbation, or progression (Level of risk: moderate) CC: Back Pain (lower)  HPI  Ms. Rahn is a 37 y.o. year old, female patient, who comes today for a follow-up evaluation. She has Hypothyroidism; Anxiety; Rheumatoid arthritis ([place]); Depression; Fibromyalgia; TMJ pain dysfunction syndrome; Chronic bronchitis ([place]); Fatigue; Chronic pain; ADD (attention deficit disorder); Learning disability; Chronic pelvic pain in female; Lower limb length difference; Vitamin D deficiency; Opioid contract exists; Acne; Benign neoplasm of skin of trunk; H/O juvenile rheumatoid arthritis; and Chronic pain disorder on their problem list. Ms. Vollman was last seen on Visit date not found. Her primarily concern today is the Back Pain (lower)  Pain Assessment: Location: Lower Back Radiating: denies radiating pain; c/o pelvic pain  Onset: More than a month ago Duration: Chronic pain Quality: Throbbing Severity: 7 /10 (subjective, self-reported pain score)  Note: Reported level is compatible with observation.                         When using our objective Pain Scale, levels between 6 and 10/10 are said to belong in an emergency room, as it progressively worsens from a 6/10, described as severely limiting, requiring emergency care not usually available at an outpatient pain management facility. At a 6/10 level, communication becomes difficult and requires great effort. Assistance to reach the emergency department may be required. Facial flushing and profuse sweating along with potentially dangerous increases in heart rate  and blood pressure will be evident. Effect on ADL: cannot do much before she needs to sit b/c of pain; difficult to complete PT exercises Timing: Intermittent Modifying factors: stretching, medications, ice, sitting, procedures BP: 120/70  HR: 95  Further details on both, my assessment(s), as well as the proposed treatment plan, please see below.  Laboratory Chemistry  Inflammation Markers (CRP: Acute Phase) (ESR: Chronic Phase) No results found for: CRP, ESRSEDRATE, LATICACIDVEN                       Rheumatology Markers Lab Results  Component Value Date   LYMEIGGIGMAB <0.91 07/06/2016   LYMEABIGMQN <0.80 07/06/2016                        Renal Function Markers Lab Results  Component Value Date   BUN 14 03/20/2013   CREATININE 0.86 03/20/2013   GFRAA >60 03/20/2013   GFRNONAA >60 03/20/2013                              Hepatic Function Markers Lab Results  Component Value Date   AST 48 (H) 04/10/2017   ALT 28 04/10/2017   ALBUMIN 4.2 04/10/2017   ALKPHOS 63 04/10/2017   LIPASE 43 04/10/2017                        Electrolytes Lab Results  Component Value Date   NA 137 03/20/2013   K 3.6 03/20/2013   CL 103 03/20/2013   CALCIUM 8.9 03/20/2013  Neuropathy Markers No results found for: VITAMINB12, FOLATE, HGBA1C, HIV                      Bone Pathology Markers Lab Results  Component Value Date   VD25OH 18.4 (L) 10/03/2016                         Coagulation Parameters Lab Results  Component Value Date   PLT 272 01/30/2017                        Cardiovascular Markers Lab Results  Component Value Date   TROPONINI < 0.02 03/20/2013   HGB 12.4 01/30/2017   HCT 37.7 01/30/2017                         CA Markers No results found for: CEA, CA125, LABCA2                      Note: Lab results reviewed.  Recent Diagnostic Imaging Review   CLINICAL DATA:  37 year old female with abdominal distention and right mid abdominal  pain with nausea for the past 2-3 months. Pain is worse and constant in pelvis. 20 pound weight loss over 3 months. No known malignancy. Rheumatoid arthritis. Subsequent encounter.  EXAM: CT ABDOMEN AND PELVIS WITH CONTRAST  TECHNIQUE: Multidetector CT imaging of the abdomen and pelvis was performed using the standard protocol following bolus administration of intravenous contrast.  CONTRAST:  148m ISOVUE-300 IOPAMIDOL (ISOVUE-300) INJECTION 61%  COMPARISON:  03/22/2017 ultrasound. 12/15/2016 pelvic MR. 07/16/2007 CT abdomen and pelvis.  FINDINGS: Lower chest: Clear lung bases. Heart size within normal limits. Slightly asymmetric breast parenchyma incompletely assessed.  Hepatobiliary: Enlarged fatty liver spanning over 18.3 cm without worrisome mass. Focal fatty sparing adjacent to the gallbladder. No calcified gallstones.  Pancreas: No pancreatic mass or inflammation.  Spleen: No splenic mass or enlargement.  Adrenals/Urinary Tract: No obstructing stone or hydronephrosis. No worrisome renal or adrenal mass. Under distended noncontrast filled urinary bladder without gross abnormality.  Stomach/Bowel: No bowel inflammatory process or mass identified. Areas of under distension and presence of stool slightly limit evaluation for detection of a mass.  Vascular/Lymphatic: No aortic aneurysm or large vessel occlusion. Trace aortic calcifications just above bifurcation.  Scattered normal size lymph nodes including mesenteric lymph nodes without adenopathy.  Reproductive: Elongated uterus with anterior fundus superiorly located and inseparable from the posterior wall of the abdomen. This may be related to adhesions from C-section. No worrisome adnexal mass.  Other: No bowel containing hernia or free intraperitoneal air.  Musculoskeletal: Mild curvature lumbar spine. Slightly dysplastic appearance of the sacrum. No acute osseous  abnormality.  IMPRESSION: Enlarged fatty liver spanning over 18.3 cm.  Trace calcified plaque of the abdominal aorta just above the bifurcation.  Elongated uterus with anterior fundus superiorly located and inseparable from the posterior wall of the abdomen. This may be related to adhesions from C-section.  Complexity Note: Imaging results reviewed. Results shared with Ms. WNeises using Layman's terms.                         Meds   Current Outpatient Medications:  .  albuterol (PROVENTIL) (2.5 MG/3ML) 0.083% nebulizer solution, Frequency:Q4HPRN   Dosage:0.0     Instructions:  Note:Dose: 1  Dx:J42, Disp: 75 mL, Rfl: 12 .  levothyroxine (SYNTHROID, LEVOTHROID) 50  MCG tablet, Take 1 tablet (50 mcg total) by mouth daily before breakfast. NAME BRAND ONLY PLEASE, Disp: 90 tablet, Rfl: 3 .  meloxicam (MOBIC) 7.5 MG tablet, Take 1 tablet (7.5 mg total) by mouth daily as needed for pain., Disp: 30 tablet, Rfl: 3 .  MYDAYIS 25 MG CP24, , Disp: , Rfl: 0 .  oxyCODONE-acetaminophen (PERCOCET) 5-325 MG tablet, Take 1-2 tablets by mouth every 6 (six) hours as needed for severe pain., Disp: 150 tablet, Rfl: 0 .  Vitamin D, Ergocalciferol, (DRISDOL) 50000 units CAPS capsule, Take 1 capsule (50,000 Units total) by mouth every 7 (seven) days., Disp: 16 capsule, Rfl: 0 .  diclofenac (VOLTAREN) 75 MG EC tablet, Take 1 tablet (75 mg total) by mouth 2 (two) times daily., Disp: 60 tablet, Rfl: 0 .  dicyclomine (BENTYL) 10 MG capsule, Take 1 capsule (10 mg total) by mouth 4 (four) times daily -  before meals and at bedtime for 14 days., Disp: 56 capsule, Rfl: 0  ROS  Constitutional: Denies any fever or chills Gastrointestinal: No reported hemesis, hematochezia, vomiting, or acute GI distress Musculoskeletal: Denies any acute onset joint swelling, redness, loss of ROM, or weakness Neurological: No reported episodes of acute onset apraxia, aphasia, dysarthria, agnosia, amnesia, paralysis, loss of  coordination, or loss of consciousness  Allergies  Ms. Reisch is allergic to ibuprofen and promethazine.  PFSH  Drug: Ms. Bignell  reports that she does not use drugs. Alcohol:  reports that she does not drink alcohol. Tobacco:  reports that she has never smoked. She has never used smokeless tobacco. Medical:  has a past medical history of ADD (attention deficit disorder), Anxiety, Chronic bronchitis ([place]), Chronic fatigue, Chronic pain, Depression, Fibromyalgia, History of stomach ulcers (2019), Hypothyroidism (01/27/2015), Learning disability, Osteoarthritis, Rheumatoid arthritis ([place]), Sleep apnea, Thyroid disease, and TMJ (dislocation of temporomandibular joint). Surgical: Ms. Segers  has a past surgical history that includes Cesarean section. Family: family history includes ADD / ADHD in her daughter and mother; Allergies in her daughter and daughter; Arthritis in her brother and paternal grandmother; COPD in her mother; Cancer in her maternal grandmother; Dementia in her paternal grandmother; Diabetes in her father and mother; Heart attack in her maternal grandfather; Heart disease in her maternal grandfather; Hyperlipidemia in her father; Learning disabilities in her mother; Mental illness in her daughter, father, and sister; Stroke in her maternal grandfather; Thyroid disease in her daughter.  Constitutional Exam  General appearance: Well nourished, well developed, and well hydrated. In no apparent acute distress Vitals:   07/19/17 0959  BP: 120/70  Pulse: 95  Resp: 16  Temp: 98.8 F (37.1 C)  TempSrc: Oral  SpO2: 100%  Weight: 197 lb (89.4 kg)  Height: 5' 1"  (1.549 m)   BMI Assessment: Estimated body mass index is 37.22 kg/m as calculated from the following:   Height as of this encounter: 5' 1"  (1.549 m).   Weight as of this encounter: 197 lb (89.4 kg).  BMI interpretation table: BMI level Category Range association with higher incidence of chronic pain  <18 kg/m2 Underweight    18.5-24.9 kg/m2 Ideal body weight   25-29.9 kg/m2 Overweight Increased incidence by 20%  30-34.9 kg/m2 Obese (Class I) Increased incidence by 68%  35-39.9 kg/m2 Severe obesity (Class II) Increased incidence by 136%  >40 kg/m2 Extreme obesity (Class III) Increased incidence by 254%   Patient's current BMI Ideal Body weight  Body mass index is 37.22 kg/m. Ideal body weight: 47.8 kg (105 lb 6.1 oz) Adjusted  ideal body weight: 64.4 kg (142 lb 0.5 oz)   BMI Readings from Last 4 Encounters:  07/19/17 37.22 kg/m  06/25/17 36.03 kg/m  06/25/17 37.22 kg/m  05/28/17 37.22 kg/m   Wt Readings from Last 4 Encounters:  07/19/17 197 lb (89.4 kg)  06/25/17 197 lb (89.4 kg)  06/25/17 197 lb (89.4 kg)  05/28/17 197 lb (89.4 kg)  Psych/Mental status: Alert, oriented x 3 (person, place, & time)       Eyes: PERLA Respiratory: No evidence of acute respiratory distress  Cervical Spine Area Exam  Skin & Axial Inspection: No masses, redness, edema, swelling, or associated skin lesions Alignment: Symmetrical Functional ROM: Unrestricted ROM      Stability: No instability detected Muscle Tone/Strength: Functionally intact. No obvious neuro-muscular anomalies detected. Sensory (Neurological): Unimpaired Palpation: No palpable anomalies              Upper Extremity (UE) Exam    Side: Right upper extremity  Side: Left upper extremity  Skin & Extremity Inspection: Skin color, temperature, and hair growth are WNL. No peripheral edema or cyanosis. No masses, redness, swelling, asymmetry, or associated skin lesions. No contractures.  Skin & Extremity Inspection: Skin color, temperature, and hair growth are WNL. No peripheral edema or cyanosis. No masses, redness, swelling, asymmetry, or associated skin lesions. No contractures.  Functional ROM: Unrestricted ROM          Functional ROM: Unrestricted ROM          Muscle Tone/Strength: Functionally intact. No obvious neuro-muscular anomalies detected.   Muscle Tone/Strength: Functionally intact. No obvious neuro-muscular anomalies detected.  Sensory (Neurological): Unimpaired          Sensory (Neurological): Unimpaired          Palpation: No palpable anomalies              Palpation: No palpable anomalies              Specialized Test(s): Deferred         Specialized Test(s): Deferred          Thoracic Spine Area Exam  Skin & Axial Inspection: No masses, redness, or swelling Alignment: Symmetrical Functional ROM: Unrestricted ROM Stability: No instability detected Muscle Tone/Strength: Functionally intact. No obvious neuro-muscular anomalies detected. Sensory (Neurological): Unimpaired Muscle strength & Tone: No palpable anomalies  Lumbar Spine Area Exam  Skin & Axial Inspection: No masses, redness, or swelling Alignment: Symmetrical Functional ROM: Decreased ROM       Stability: No instability detected Muscle Tone/Strength: Functionally intact. No obvious neuro-muscular anomalies detected. Sensory (Neurological): Articular pain pattern Palpation: Complains of area being tender to palpation Bilateral Fist Percussion Test Provocative Tests: Lumbar Hyperextension and rotation test: Positive bilaterally for facet joint pain. Lumbar Lateral bending test: Positive due to pain. Patrick's Maneuver: evaluation deferred today                    Gait & Posture Assessment  Ambulation: Unassisted Gait: Relatively normal for age and body habitus Posture: WNL   Lower Extremity Exam    Side: Right lower extremity  Side: Left lower extremity  Stability: No instability observed          Stability: No instability observed          Skin & Extremity Inspection: Skin color, temperature, and hair growth are WNL. No peripheral edema or cyanosis. No masses, redness, swelling, asymmetry, or associated skin lesions. No contractures.  Skin & Extremity Inspection: Skin color, temperature, and  hair growth are WNL. No peripheral edema or cyanosis. No masses,  redness, swelling, asymmetry, or associated skin lesions. No contractures.  Functional ROM: Unrestricted ROM                  Functional ROM: Unrestricted ROM                  Muscle Tone/Strength: Functionally intact. No obvious neuro-muscular anomalies detected.  Muscle Tone/Strength: Functionally intact. No obvious neuro-muscular anomalies detected.  Sensory (Neurological): Unimpaired  Sensory (Neurological): Unimpaired  Palpation: No palpable anomalies  Palpation: No palpable anomalies   Assessment  Primary Diagnosis & Pertinent Problem List: The primary encounter diagnosis was Spondylosis without myelopathy or radiculopathy, lumbar region. Diagnoses of Rheumatoid arthritis, involving unspecified site, unspecified rheumatoid factor presence ([place]), Chronic fatigue, Chronic pain syndrome, TMJ pain dysfunction syndrome, Fibromyalgia, and Myofascial pain were also pertinent to this visit.  Status Diagnosis  Having a Flare-up Persistent Persistent 1. Spondylosis without myelopathy or radiculopathy, lumbar region   2. Rheumatoid arthritis, involving unspecified site, unspecified rheumatoid factor presence ([place])   3. Chronic fatigue   4. Chronic pain syndrome   5. TMJ pain dysfunction syndrome   6. Fibromyalgia   7. Myofascial pain      General Recommendations: The pain condition that the patient suffers from is best treated with a multidisciplinary approach that involves an increase in physical activity to prevent de-conditioning and worsening of the pain cycle, as well as psychological counseling (formal and/or informal) to address the co-morbid psychological affects of pain. Treatment will often involve judicious use of pain medications and interventional procedures to decrease the pain, allowing the patient to participate in the physical activity that will ultimately produce long-lasting pain reductions. The goal of the multidisciplinary approach is to return the patient to a higher level of  overall function and to restore their ability to perform activities of daily living.  37 year old female with a history of ADD, rheumatoid arthritis, fibromyalgia, chronic fatigue syndrome, depression who presents with diffuse whole body pain most pronounced in her joints including shoulders wrists, knee, hip, and axial low back pain with radiation to her right side. Patient also has a history of pelvic pain. She has been working with physical therapy which she states has been helping for her pelvic pain. Patient was previously seen by [facility] rheumatology for many years who managed her opioid therapy which includes oxycodone 5 mg 1-2 tablets every 6 hours as needed for severe pain, quantity 150 month.  This is currently being prescribed by her primary care provider.  Patient's last visit with me was in December 05, 2016.  At that time she was on Xanax.  Patient since then has discontinued Xanax and PMP has been checked to confirm.  I did commend her on this as the patient stated that this was difficult to do.  Patient is continuing to see a psychiatrist.  She has an appointment with pain psychology at the beginning of next month.  Once she has that completed and as long as there are not any concerns from their standpoint, I can consider taking over the patient's chronic opioid regimen.  I did notify her that we would wean her down to oxycodone 5 mg, quantity 51-month(from 146-monthto reduce her monthly MME to less than 60.  Furthermore the patient has tried meloxicam which was not beneficial for her axial low back pain.  We discussed diclofenac and will give that a trial.  Regards the patient's axial low  back pain related to lumbar facet arthropathy and lumbar spondylosis, patient is status post diagnostic lumbar facet medial branch nerve block #1 on December 18, 2016 bilaterally at L3, L4, L5 which she states was helpful for her axial low back pain.  She is interested in repeating this procedure.  We will  get her scheduled for this.  In regards to her pelvic pain, not a clear reason why.  Patient has undergone CT scan of her abdomen and pelvis along with multiple GI tests for C. difficile, H. pylori, celiac disease which were all negative.  I did discuss ganglion of impar block for her persistent pelvic pain but the patient wants to hold off at this time.  Plan: -Repeat UDS.  Should be negative for Xanax, positive for oxycodone and its metabolites -Stop Mobic and all other NSAIDs.  Diclofenac 75 mg twice daily after meal. -Scheduled for bilateral lumbar facet medial branch nerve blocks #2 at L3, L4, L5 bilaterally with sedation -Patient needs to see pain psychologist which she has an appointment for next month for risk of substance abuse disorder.  As long as their evaluation does not reveal any red flags or concerns, I can consider taking over the patient's chronic opioid regimen however at a reduced dose so that her monthly allotment is 32-month  We have not taken over the patient's chronic opioid management yet.  Will repeat UDS today and patient needs to see pain psychologist before we do.   Plan of Care  Pharmacotherapy (Medications Ordered): Meds ordered this encounter  Medications  . diclofenac (VOLTAREN) 75 MG EC tablet    Sig: Take 1 tablet (75 mg total) by mouth 2 (two) times daily.    Dispense:  60 tablet    Refill:  0   Lab-work, procedure(s), and/or referral(s): Orders Placed This Encounter  Procedures  . LUMBAR FACET(MEDIAL BRANCH NERVE BLOCK) MBNB  . Compliance Drug Analysis, Ur    Provider-requested follow-up: Return in about 11 days (around 07/30/2017) for Procedure. Time Note: Greater than 50% of the 25 minute(s) of face-to-face time spent with Ms. WNease was spent in counseling/coordination of care regarding: Ms. WHushprimary cause of pain, the treatment plan, treatment alternatives, the risks and possible complications of proposed treatment, medication side effects,  going over the informed consent and the goals of pain management (increased in functionality).  Future Appointments  Date Time Provider DDanube 07/25/2017 10:00 AM [facility]Valerie Roys DO [facility] 07/30/2017 11:00 AM LGillis Santa MD [facility]    Primary Care Physician: JValerie Roys DO [facility] Note by: BGillis Santa M.D Date: 07/19/2017; Time: 12:34 PM  Patient Instructions   Rx for Voltaren has been escribed to your pharmacy. ____________________________________________________________________________________________  Preparing for Procedure with Sedation  Instructions: . Oral Intake: Do not eat or drink anything for at least 8 hours prior to your procedure. . Transportation: Public transportation is not allowed. Bring an adult driver. The driver must be physically present in our waiting room before any procedure can be started. .Marland KitchenPhysical Assistance: Bring an adult physically capable of assisting you, in the event you need help. This adult should keep you company at home for at least 6 hours after the procedure. . Blood Pressure Medicine: Take your blood pressure medicine with a sip of water the morning of the procedure. . Blood thinners:  . Diabetics on insulin: Notify the staff so that you can be scheduled 1st case in the morning. If  your diabetes requires high dose insulin, take only  of your normal insulin dose the morning of the procedure and notify the staff that you have done so. . Preventing infections: Shower with an antibacterial soap the morning of your procedure. . Build-up your immune system: Take 1000 mg of Vitamin C with every meal (3 times a day) the day prior to your procedure. Marland Kitchen Antibiotics: Inform the staff if you have a condition or reason that requires you to take antibiotics before dental procedures. . Pregnancy: If you are pregnant, call and cancel the procedure. . Sickness: If you have a cold,  fever, or any active infections, call and cancel the procedure. . Arrival: You must be in the facility at least 30 minutes prior to your scheduled procedure. . Children: Do not bring children with you. . Dress appropriately: Bring dark clothing that you would not mind if they get stained. . Valuables: Do not bring any jewelry or valuables.  Procedure appointments are reserved for interventional treatments only. Marland Kitchen No Prescription Refills. . No medication changes will be discussed during procedure appointments. . No disability issues will be discussed.  Remember:  Regular Business hours are:  Monday to Thursday 8:00 AM to 4:00 PM  Provider's Schedule: Milinda Pointer, MD:  Procedure days: Tuesday and Thursday 7:30 AM to 4:00 PM  Gillis Santa, MD:  Procedure days: Monday and Wednesday 7:30 AM to 4:00 PM ____________________________________________________________________________________________  GENERAL RISKS AND COMPLICATIONS  What are the risk, side effects and possible complications? Generally speaking, most procedures are safe.  However, with any procedure there are risks, side effects, and the possibility of complications.  The risks and complications are dependent upon the sites that are lesioned, or the type of nerve block to be performed.  The closer the procedure is to the spine, the more serious the risks are.  Great care is taken when placing the radio frequency needles, block needles or lesioning probes, but sometimes complications can occur. 1. Infection: Any time there is an injection through the skin, there is a risk of infection.  This is why sterile conditions are used for these blocks.  There are four possible types of infection. 1. Localized skin infection. 2. Central Nervous System Infection-This can be in the form of Meningitis, which can be deadly. 3. Epidural Infections-This can be in the form of an epidural abscess, which can cause pressure inside of the spine,  causing compression of the spinal cord with subsequent paralysis. This would require an emergency surgery to decompress, and there are no guarantees that the patient would recover from the paralysis. 4. Discitis-This is an infection of the intervertebral discs.  It occurs in about 1% of discography procedures.  It is difficult to treat and it may lead to surgery.        2. Pain: the needles have to go through skin and soft tissues, will cause soreness.       3. Damage to internal structures:  The nerves to be lesioned may be near blood vessels or    other nerves which can be potentially damaged.       4. Bleeding: Bleeding is more common if the patient is taking blood thinners such as  aspirin, Coumadin, Ticiid, Plavix, etc., or if he/she have some genetic predisposition  such as hemophilia. Bleeding into the spinal canal can cause compression of the spinal  cord with subsequent paralysis.  This would require an emergency surgery to  decompress and there are no guarantees that the  patient would recover from the  paralysis.       5. Pneumothorax:  Puncturing of a lung is a possibility, every time a Rodriguez is introduced in  the area of the chest or upper back.  Pneumothorax refers to free air around the  collapsed lung(s), inside of the thoracic cavity (chest cavity).  Another two possible  complications related to a similar event would include: Hemothorax and Chylothorax.   These are variations of the Pneumothorax, where instead of air around the collapsed  lung(s), you may have blood or chyle, respectively.       6. Spinal headaches: They may occur with any procedures in the area of the spine.       7. Persistent CSF (Cerebro-Spinal Fluid) leakage: This is a rare problem, but may occur  with prolonged intrathecal or epidural catheters either due to the formation of a fistulous  track or a dural tear.       8. Nerve damage: By working so close to the spinal cord, there is always a possibility of  nerve  damage, which could be as serious as a permanent spinal cord injury with  paralysis.       9. Death:  Although rare, severe deadly allergic reactions known as "Anaphylactic  reaction" can occur to any of the medications used.      10. Worsening of the symptoms:  We can always make thing worse.  What are the chances of something like this happening? Chances of any of this occuring are extremely low.  By statistics, you have more of a chance of getting killed in a motor vehicle accident: while driving to the hospital than any of the above occurring .  Nevertheless, you should be aware that they are possibilities.  In general, it is similar to taking a shower.  Everybody knows that you can slip, hit your head and get killed.  Does that mean that you should not shower again?  Nevertheless always keep in mind that statistics do not mean anything if you happen to be on the wrong side of them.  Even if a procedure has a 1 (one) in a 1,000,000 (million) chance of going wrong, it you happen to be that one..Also, keep in mind that by statistics, you have more of a chance of having something go wrong when taking medications.  Who should not have this procedure? If you are on a blood thinning medication (e.g. Coumadin, Plavix, see list of "Blood Thinners"), or if you have an active infection going on, you should not have the procedure.  If you are taking any blood thinners, please inform your physician.  How should I prepare for this procedure?  Do not eat or drink anything at least six hours prior to the procedure.  Bring a driver with you .  It cannot be a taxi.  Come accompanied by an adult that can drive you back, and that is strong enough to help you if your legs get weak or numb from the local anesthetic.  Take all of your medicines the morning of the procedure with just enough water to swallow them.  If you have diabetes, make sure that you are scheduled to have your procedure done first thing in the  morning, whenever possible.  If you have diabetes, take only half of your insulin dose and notify our nurse that you have done so as soon as you arrive at the clinic.  If you are diabetic, but only take blood sugar  pills (oral hypoglycemic), then do not take them on the morning of your procedure.  You may take them after you have had the procedure.  Do not take aspirin or any aspirin-containing medications, at least eleven (11) days prior to the procedure.  They may prolong bleeding.  Wear loose fitting clothing that may be easy to take off and that you would not mind if it got stained with Betadine or blood.  Do not wear any jewelry or perfume  Remove any nail coloring.  It will interfere with some of our monitoring equipment.  NOTE: Remember that this is not meant to be interpreted as a complete list of all possible complications.  Unforeseen problems may occur.  BLOOD THINNERS The following drugs contain aspirin or other products, which can cause increased bleeding during surgery and should not be taken for 2 weeks prior to and 1 week after surgery.  If you should need take something for relief of minor pain, you may take acetaminophen which is found in Tylenol,m Datril, Anacin-3 and Panadol. It is not blood thinner. The products listed below are.  Do not take any of the products listed below in addition to any listed on your instruction sheet.  A.P.C or A.P.C with Codeine Codeine Phosphate Capsules #3 Ibuprofen Ridaura  ABC compound Congesprin Imuran rimadil  Advil Cope Indocin Robaxisal  Alka-Seltzer Effervescent Pain Reliever and Antacid Coricidin or Coricidin-D  Indomethacin Rufen  Alka-Seltzer plus Cold Medicine Cosprin Ketoprofen S-A-C Tablets  Anacin Analgesic Tablets or Capsules Coumadin Korlgesic Salflex  Anacin Extra Strength Analgesic tablets or capsules CP-2 Tablets Lanoril Salicylate  Anaprox Cuprimine Capsules Levenox Salocol  Anexsia-D Dalteparin Magan Salsalate   Anodynos Darvon compound Magnesium Salicylate Sine-off  Ansaid Dasin Capsules Magsal Sodium Salicylate  Anturane Depen Capsules Marnal Soma  APF Arthritis pain formula Dewitt's Pills Measurin Stanback  Argesic Dia-Gesic Meclofenamic Sulfinpyrazone  Arthritis Bayer Timed Release Aspirin Diclofenac Meclomen Sulindac  Arthritis pain formula Anacin Dicumarol Medipren Supac  Analgesic (Safety coated) Arthralgen Diffunasal Mefanamic Suprofen  Arthritis Strength Bufferin Dihydrocodeine Mepro Compound Suprol  Arthropan liquid Dopirydamole Methcarbomol with Aspirin Synalgos  ASA tablets/Enseals Disalcid Micrainin Tagament  Ascriptin Doan's Midol Talwin  Ascriptin A/D Dolene Mobidin Tanderil  Ascriptin Extra Strength Dolobid Moblgesic Ticlid  Ascriptin with Codeine Doloprin or Doloprin with Codeine Momentum Tolectin  Asperbuf Duoprin Mono-gesic Trendar  Aspergum Duradyne Motrin or Motrin IB Triminicin  Aspirin plain, buffered or enteric coated Durasal Myochrisine Trigesic  Aspirin Suppositories Easprin Nalfon Trillsate  Aspirin with Codeine Ecotrin Regular or Extra Strength Naprosyn Uracel  Atromid-S Efficin Naproxen Ursinus  Auranofin Capsules Elmiron Neocylate Vanquish  Axotal Emagrin Norgesic Verin  Azathioprine Empirin or Empirin with Codeine Normiflo Vitamin E  Azolid Emprazil Nuprin Voltaren  Bayer Aspirin plain, buffered or children's or timed BC Tablets or powders Encaprin Orgaran Warfarin Sodium  Buff-a-Comp Enoxaparin Orudis Zorpin  Buff-a-Comp with Codeine Equegesic Os-Cal-Gesic   Buffaprin Excedrin plain, buffered or Extra Strength Oxalid   Bufferin Arthritis Strength Feldene Oxphenbutazone   Bufferin plain or Extra Strength Feldene Capsules Oxycodone with Aspirin   Bufferin with Codeine Fenoprofen Fenoprofen Pabalate or Pabalate-SF   Buffets II Flogesic Panagesic   Buffinol plain or Extra Strength Florinal or Florinal with Codeine Panwarfarin   Buf-Tabs Flurbiprofen  Penicillamine   Butalbital Compound Four-way cold tablets Penicillin   Butazolidin Fragmin Pepto-Bismol   Carbenicillin Geminisyn Percodan   Carna Arthritis Reliever Geopen Persantine   Carprofen Gold's salt Persistin   Chloramphenicol Goody's Phenylbutazone   Chloromycetin Haltrain Piroxlcam  Clmetidine heparin Plaquenil   Cllnoril Hyco-pap Ponstel   Clofibrate Hydroxy chloroquine Propoxyphen         Before stopping any of these medications, be sure to consult the physician who ordered them.  Some, such as Coumadin (Warfarin) are ordered to prevent or treat serious conditions such as "deep thrombosis", "pumonary embolisms", and other heart problems.  The amount of time that you may need off of the medication may also vary with the medication and the reason for which you were taking it.  If you are taking any of these medications, please make sure you notify your pain physician before you undergo any procedures.         Facet Blocks Patient Information  Description: The facets are joints in the spine between the vertebrae.  Like any joints in the body, facets can become irritated and painful.  Arthritis can also effect the facets.  By injecting steroids and local anesthetic in and around these joints, we can temporarily block the nerve supply to them.  Steroids act directly on irritated nerves and tissues to reduce selling and inflammation which often leads to decreased pain.  Facet blocks may be done anywhere along the spine from the neck to the low back depending upon the location of your pain.   After numbing the skin with local anesthetic (like Novocaine), a small Rodriguez is passed onto the facet joints under x-ray guidance.  You may experience a sensation of pressure while this is being done.  The entire block usually lasts about 15-25 minutes.   Conditions which may be treated by facet blocks:   Low back/buttock pain  Neck/shoulder pain  Certain types of  headaches  Preparation for the injection:  1. Do not eat any solid food or dairy products within 8 hours of your appointment. 2. You may drink clear liquid up to 3 hours before appointment.  Clear liquids include water, black coffee, juice or soda.  No milk or cream please. 3. You may take your regular medication, including pain medications, with a sip of water before your appointment.  Diabetics should hold regular insulin (if taken separately) and take 1/2 normal NPH dose the morning of the procedure.  Carry some sugar containing items with you to your appointment. 4. A driver must accompany you and be prepared to drive you home after your procedure. 5. Bring all your current medications with you. 6. An IV may be inserted and sedation may be given at the discretion of the physician. 7. A blood pressure cuff, EKG and other monitors will often be applied during the procedure.  Some patients may need to have extra oxygen administered for a short period. 8. You will be asked to provide medical information, including your allergies and medications, prior to the procedure.  We must know immediately if you are taking blood thinners (like Coumadin/Warfarin) or if you are allergic to IV iodine contrast (dye).  We must know if you could possible be pregnant.  Possible side-effects:   Bleeding from Rodriguez site  Infection (rare, may require surgery)  Nerve injury (rare)  Numbness & tingling (temporary)  Difficulty urinating (rare, temporary)  Spinal headache (a headache worse with upright posture)  Light-headedness (temporary)  Pain at injection site (serveral days)  Decreased blood pressure (rare, temporary)  Weakness in arm/leg (temporary)  Pressure sensation in back/neck (temporary)   Call if you experience:   Fever/chills associated with headache or increased back/neck pain  Headache worsened by an upright position  [place]  onset, weakness or numbness of an extremity below the  injection site  Hives or difficulty breathing (go to the emergency room)  Inflammation or drainage at the injection site(s)  Severe back/neck pain greater than usual  New symptoms which are concerning to you  Please note:  Although the local anesthetic injected can often make your back or neck feel good for several hours after the injection, the pain will likely return. It takes 3-7 days for steroids to work.  You may not notice any pain relief for at least one week.  If effective, we will often do a series of 2-3 injections spaced 3-6 weeks apart to maximally decrease your pain.  After the initial series, you may be a candidate for a more permanent nerve block of the facets.  If you have any questions, please call #[phone] [facility]

## 2017-07-25 ENCOUNTER — Ambulatory Visit (INDEPENDENT_AMBULATORY_CARE_PROVIDER_SITE_OTHER): Payer: Medicare Other | Admitting: Family Medicine

## 2017-07-25 ENCOUNTER — Encounter: Payer: Self-pay | Admitting: Family Medicine

## 2017-07-25 VITALS — BP 121/80 | HR 80 | Temp 98.9°F | Wt 196.4 lb

## 2017-07-25 DIAGNOSIS — F339 Major depressive disorder, recurrent, unspecified: Secondary | ICD-10-CM | POA: Diagnosis not present

## 2017-07-25 DIAGNOSIS — R102 Pelvic and perineal pain: Secondary | ICD-10-CM

## 2017-07-25 DIAGNOSIS — G8929 Other chronic pain: Secondary | ICD-10-CM | POA: Diagnosis not present

## 2017-07-25 DIAGNOSIS — R197 Diarrhea, unspecified: Secondary | ICD-10-CM

## 2017-07-25 MED ORDER — OXYCODONE-ACETAMINOPHEN 5-325 MG PO TABS: 1.0000 | ORAL_TABLET | Freq: Four times a day (QID) | ORAL | 0 refills | Status: DC | PRN

## 2017-07-25 NOTE — Progress Notes (Signed)
BP 121/80 (BP Location: Left Arm, Patient Position: Sitting, Cuff Size: Normal)   Pulse 80   Temp 98.9 F (37.2 C)   Wt 196 lb 7 oz (89.1 kg)   LMP 07/01/2017 (Exact Date)   SpO2 100%   BMI 37.12 kg/m    Subjective:    Patient ID: Sharon Rodriguez, female    DOB: 05-25-1980, 37 y.o.   MRN: [id number]  HPI: Sharon Rodriguez is a 37 y.o. female  Chief Complaint  Patient presents with  . Pain   CHRONIC PAIN- saw pain management again on 07/19/17- needs to see pain psychology. They are going to take her allotment down to 120pills/month and if there are no red flags should be taking over in the next month. She notes that she has been feeling bad. She is feeling more depressed and has been not doing well. Present dose: 62.5 Morphine equivalents Pain control status: exacerbated Duration: chronic Location: wide spread Quality: tight, throbbing, aching Current Pain Level: 6/10 Previous Pain Level: 9/10 Breakthrough pain: yes Benefit from narcotic medications: yes What Activities task can be accomplished with current medication?: able to take care of her kids Interested in weaning off narcotics:no   Stool softners/OTC fiber: no  Previous pain specialty evaluation: yes Non-narcotic analgesic meds: not currently, was on gabapentin, now refuses it Narcotic contract: yes  Having a lot of diarrhea, recently started on the diclofenac by pain management- unsure if that is the cause of it. Has been having it repeatedly. Has not followed up with GI.  Has been having pain in her low back. Pain is moving into her hip. Notes that it is worse with the gardening.  She is not sure if she is having any benefit from OMT. She thinks it may help for a short period of time, but only a couple of hours, then returns with the same intensity if not worse than previously. She is otherwise doing well with no other concerns or complaints at this time.   Relevant past medical, surgical, family and social history  reviewed and updated as indicated. Interim medical history since our last visit reviewed. Allergies and medications reviewed and updated.  Review of Systems  Constitutional: Negative.   Respiratory: Negative.   Cardiovascular: Negative.   Musculoskeletal: Positive for arthralgias, back pain and myalgias. Negative for gait problem, joint swelling, neck pain and neck stiffness.  Skin: Negative.   Neurological: Negative.   Psychiatric/Behavioral: Negative.     Per HPI unless specifically indicated above     Objective:    BP 121/80 (BP Location: Left Arm, Patient Position: Sitting, Cuff Size: Normal)   Pulse 80   Temp 98.9 F (37.2 C)   Wt 196 lb 7 oz (89.1 kg)   LMP 07/01/2017 (Exact Date)   SpO2 100%   BMI 37.12 kg/m   Wt Readings from Last 3 Encounters:  07/25/17 196 lb 7 oz (89.1 kg)  07/19/17 197 lb (89.4 kg)  06/25/17 197 lb (89.4 kg)    Physical Exam  Constitutional: She is oriented to person, place, and time. She appears well-developed and well-nourished. No distress.  HENT:  Head: Normocephalic and atraumatic.  Right Ear: Hearing normal.  Left Ear: Hearing normal.  Nose: Nose normal.  Eyes: Conjunctivae and lids are normal. Right eye exhibits no discharge. Left eye exhibits no discharge. No scleral icterus.  Cardiovascular: Normal rate, regular rhythm, normal heart sounds and intact distal pulses. Exam reveals no gallop and no friction rub.  No murmur  heard. Pulmonary/Chest: Effort normal and breath sounds normal. No stridor. No respiratory distress. She has no wheezes. She has no rales. She exhibits no tenderness.  Abdominal: Soft. She exhibits no distension and no mass. There is no tenderness. There is no rebound and no guarding. No hernia.  Musculoskeletal: Normal range of motion.  Neurological: She is alert and oriented to person, place, and time.  Skin: Skin is warm, dry and intact. Capillary refill takes less than 2 seconds. No rash noted. She is not  diaphoretic. No erythema. No pallor.  Psychiatric: She has a normal mood and affect. Her speech is normal and behavior is normal. Judgment and thought content normal. Cognition and memory are normal.  Nursing note and vitals reviewed.   Results for orders placed or performed during the hospital encounter of 04/13/17  Calprotectin, Fecal  Result Value Ref Range   Calprotectin, Fecal 43 0 - 120 ug/g  C difficile quick scan w PCR reflex  Result Value Ref Range   C Diff antigen NEGATIVE NEGATIVE   C Diff toxin NEGATIVE NEGATIVE   C Diff interpretation No C. difficile detected.   Gastrointestinal Panel by PCR , Stool  Result Value Ref Range   Campylobacter species NOT DETECTED NOT DETECTED   Plesimonas shigelloides NOT DETECTED NOT DETECTED   Salmonella species NOT DETECTED NOT DETECTED   Yersinia enterocolitica NOT DETECTED NOT DETECTED   Vibrio species NOT DETECTED NOT DETECTED   Vibrio cholerae NOT DETECTED NOT DETECTED   Enteroaggregative E coli (EAEC) NOT DETECTED NOT DETECTED   Enteropathogenic E coli (EPEC) NOT DETECTED NOT DETECTED   Enterotoxigenic E coli (ETEC) NOT DETECTED NOT DETECTED   Shiga like toxin producing E coli (STEC) NOT DETECTED NOT DETECTED   Shigella/Enteroinvasive E coli (EIEC) NOT DETECTED NOT DETECTED   Cryptosporidium NOT DETECTED NOT DETECTED   Cyclospora cayetanensis NOT DETECTED NOT DETECTED   Entamoeba histolytica NOT DETECTED NOT DETECTED   Giardia lamblia NOT DETECTED NOT DETECTED   Adenovirus F40/41 NOT DETECTED NOT DETECTED   Astrovirus NOT DETECTED NOT DETECTED   Norovirus GI/GII NOT DETECTED NOT DETECTED   Rotavirus A NOT DETECTED NOT DETECTED   Sapovirus (I, II, IV, and V) NOT DETECTED NOT DETECTED  H. pylori antigen, stool  Result Value Ref Range   H. Pylori Stool Ag, Eia Negative Negative      Assessment & Plan:   Problem List Items Addressed This Visit      Other   Depression    Not doing well. Encouraged patient to follow up with  her psychiatrist and to reach out if not doing well, not to just wait until her next appointment.       Chronic pain - Primary    Patient followed up with pain management and is to have injections with them. She still has not seen the pain psychologist, but states that she is going to.  Patient's pain exacerbated today on current regimen due to working in her yard. PMP reviewed today and appropriate. Discussion had with patient again today regarding her pain management, we do short term pain management and were intending to bridge her until she got into pain management. However, it has been 10 months, and patient has not fully established with the local pain management. Discussed that we will give her her Rx this month, but then will have to stop- she is to be decreased by pain management. If she has not fully established in the next month, we will start decreasing her  medicine. During this time frame, she will need to establish with a pain management doctor. Rx given today.      Relevant Medications   oxyCODONE-acetaminophen (PERCOCET) 5-325 MG tablet   Chronic pelvic pain in female    Not doing well. It is unclear if she is doing well with OMT or if it is helping at all. She is scheduled for an injection with pain management next week. We will hold on treatment at this time, and if she notices a big difference, we will treat her. If not, we will stop treatment as it is not clear if it is helping.       Relevant Medications   oxyCODONE-acetaminophen (PERCOCET) 5-325 MG tablet    Other Visit Diagnoses    Diarrhea, unspecified type       Encouraged patient to follow up with GI. Appointment scheduled for her.        Follow up plan: Return in about 2 weeks (around 08/08/2017) for Procedure visit.

## 2017-07-25 NOTE — Patient Instructions (Signed)
Dr. [facility] [address] [place], [place] [address]  Main: [phone]  June 6, 1:30PM

## 2017-07-29 ENCOUNTER — Encounter: Payer: Self-pay | Admitting: Family Medicine

## 2017-07-29 NOTE — Assessment & Plan Note (Signed)
Not doing well. It is unclear if she is doing well with OMT or if it is helping at all. She is scheduled for an injection with pain management next week. We will hold on treatment at this time, and if she notices a big difference, we will treat her. If not, we will stop treatment as it is not clear if it is helping.

## 2017-07-29 NOTE — Assessment & Plan Note (Signed)
Not doing well. Encouraged patient to follow up with her psychiatrist and to reach out if not doing well, not to just wait until her next appointment.

## 2017-07-29 NOTE — Assessment & Plan Note (Signed)
Patient followed up with pain management and is to have injections with them. She still has not seen the pain psychologist, but states that she is going to.  Patient's pain exacerbated today on current regimen due to working in her yard. PMP reviewed today and appropriate. Discussion had with patient again today regarding her pain management, we do short term pain management and were intending to bridge her until she got into pain management. However, it has been 10 months, and patient has not fully established with the local pain management. Discussed that we will give her her Rx this month, but then will have to stop- she is to be decreased by pain management. If she has not fully established in the next month, we will start decreasing her medicine. During this time frame, she will need to establish with a pain management doctor. Rx given today.

## 2017-07-30 ENCOUNTER — Ambulatory Visit (HOSPITAL_BASED_OUTPATIENT_CLINIC_OR_DEPARTMENT_OTHER): Payer: Medicare Other | Admitting: Student in an Organized Health Care Education/Training Program

## 2017-07-30 ENCOUNTER — Encounter: Payer: Self-pay | Admitting: Student in an Organized Health Care Education/Training Program

## 2017-07-30 ENCOUNTER — Ambulatory Visit
Admission: RE | Admit: 2017-07-30 | Discharge: 2017-07-30 | Disposition: A | Discharge status: Home / Self Care | Payer: Medicare Other | Source: Ambulatory Visit | Attending: Student in an Organized Health Care Education/Training Program | Admitting: Student in an Organized Health Care Education/Training Program

## 2017-07-30 VITALS — BP 107/60 | HR 80 | Temp 97.5°F | Resp 14 | Ht 61.0 in | Wt 198.0 lb

## 2017-07-30 DIAGNOSIS — M47816 Spondylosis without myelopathy or radiculopathy, lumbar region: Secondary | ICD-10-CM

## 2017-07-30 DIAGNOSIS — Z79899 Other long term (current) drug therapy: Secondary | ICD-10-CM | POA: Insufficient documentation

## 2017-07-30 DIAGNOSIS — Z79891 Long term (current) use of opiate analgesic: Secondary | ICD-10-CM | POA: Insufficient documentation

## 2017-07-30 DIAGNOSIS — Z888 Allergy status to other drugs, medicaments and biological substances status: Secondary | ICD-10-CM | POA: Diagnosis not present

## 2017-07-30 DIAGNOSIS — Z886 Allergy status to analgesic agent status: Secondary | ICD-10-CM | POA: Diagnosis not present

## 2017-07-30 DIAGNOSIS — G894 Chronic pain syndrome: Secondary | ICD-10-CM | POA: Diagnosis not present

## 2017-07-30 DIAGNOSIS — Z9889 Other specified postprocedural states: Secondary | ICD-10-CM | POA: Diagnosis not present

## 2017-07-30 MED ORDER — FENTANYL CITRATE (PF) 100 MCG/2ML IJ SOLN
25.0000 ug | INTRAMUSCULAR | Status: DC | PRN
  Administered 2017-07-30: 100 ug via INTRAVENOUS
  Filled 2017-07-30: qty 2

## 2017-07-30 MED ORDER — LACTATED RINGERS IV SOLN
1000.0000 mL | Freq: Once | INTRAVENOUS | Status: AC
  Administered 2017-07-30: 1000 mL via INTRAVENOUS

## 2017-07-30 MED ORDER — DEXAMETHASONE SODIUM PHOSPHATE 10 MG/ML IJ SOLN
10.0000 mg | Freq: Once | INTRAMUSCULAR | Status: AC
  Administered 2017-07-30: 10 mg
  Filled 2017-07-30: qty 1

## 2017-07-30 MED ORDER — ROPIVACAINE HCL 2 MG/ML IJ SOLN
10.0000 mL | Freq: Once | INTRAMUSCULAR | Status: AC
  Administered 2017-07-30: 10 mL
  Filled 2017-07-30: qty 10

## 2017-07-30 MED ORDER — LIDOCAINE HCL 1 % IJ SOLN
10.0000 mL | Freq: Once | INTRAMUSCULAR | Status: AC
  Administered 2017-07-30: 5 mL
  Filled 2017-07-30: qty 10

## 2017-07-30 NOTE — Progress Notes (Signed)
Patient's Name: Sharon Rodriguez  MRN: [id number]  Referring Provider: Valerie Roys, DO  DOB: [birth year]  PCP: Valerie Roys, DO  DOS: 07/30/2017  Note by: Gillis Santa, MD  Service setting: Ambulatory outpatient  Specialty: Interventional Pain Management  Patient type: Established  Location: [facility] (AMB) Pain Management Facility  Visit type: Interventional Procedure   Primary Reason for Visit: Interventional Pain Management Treatment. CC: Back Pain (lower bilateral)  Procedure:       Anesthesia, Analgesia, Anxiolysis:  Type: Lumbar Facet, Medial Branch Block(s) #2  Primary Purpose: Diagnostic Region: Posterolateral Lumbosacral Spine Level: L3, L4, L5,  Medial Branch Level(s). Injecting these levels blocks the L4-5, and L5-S1 lumbar facet joints. Laterality: Bilateral  Type: Moderate (Conscious) Sedation combined with Local Anesthesia Indication(s): Analgesia and Anxiety Route: Intravenous (IV) IV Access: Secured Sedation: Meaningful verbal contact was maintained at all times during the procedure  Local Anesthetic: Lidocaine 1%   Indications: 1. Spondylosis without myelopathy or radiculopathy, lumbar region    Pain Score: Pre-procedure: 6 /10 Post-procedure: 6 /10  Pre-op Assessment:  Sharon Rodriguez is a 37 y.o. (year old), female patient, seen today for interventional treatment. She  has a past surgical history that includes Cesarean section. Sharon Rodriguez has a current medication list which includes the following prescription(s): albuterol, diclofenac, levothyroxine, oxycodone-acetaminophen, vitamin d (ergocalciferol), dicyclomine, and mydayis, and the following Facility-Administered Medications: fentanyl. Her primarily concern today is the Back Pain (lower bilateral)  Initial Vital Signs:  Pulse/HCG Rate: 80ECG Heart Rate: 93 Temp: 98.2 F (36.8 C) Resp: 16 BP: 108/60 SpO2: 100 %  BMI: Estimated body mass index is 37.41 kg/m as calculated from the following:   Height as of this  encounter: 5\' 1"  (1.549 m).   Weight as of this encounter: 198 lb (89.8 kg).  Risk Assessment: Allergies: Reviewed. She is allergic to ibuprofen and promethazine.  Allergy Precautions: None required Coagulopathies: Reviewed. None identified.  Blood-thinner therapy: None at this time Active Infection(s): Reviewed. None identified. Sharon Rodriguez is afebrile  Site Confirmation: Sharon Rodriguez was asked to confirm the procedure and laterality before marking the site Procedure checklist: Completed Consent: Before the procedure and under the influence of no sedative(s), amnesic(s), or anxiolytics, the patient was informed of the treatment options, risks and possible complications. To fulfill our ethical and legal obligations, as recommended by the American Medical Association's Code of Ethics, I have informed the patient of my clinical impression; the nature and purpose of the treatment or procedure; the risks, benefits, and possible complications of the intervention; the alternatives, including doing nothing; the risk(s) and benefit(s) of the alternative treatment(s) or procedure(s); and the risk(s) and benefit(s) of doing nothing. The patient was provided information about the general risks and possible complications associated with the procedure. These may include, but are not limited to: failure to achieve desired goals, infection, bleeding, organ or nerve damage, allergic reactions, paralysis, and death. In addition, the patient was informed of those risks and complications associated to Spine-related procedures, such as failure to decrease pain; infection (i.e.: Meningitis, epidural or intraspinal abscess); bleeding (i.e.: epidural hematoma, subarachnoid hemorrhage, or any other type of intraspinal or peri-dural bleeding); organ or nerve damage (i.e.: Any type of peripheral nerve, nerve root, or spinal cord injury) with subsequent damage to sensory, motor, and/or autonomic systems, resulting in permanent pain,  numbness, and/or weakness of one or several areas of the body; allergic reactions; (i.e.: anaphylactic reaction); and/or death. Furthermore, the patient was informed of those risks and complications associated  with the medications. These include, but are not limited to: allergic reactions (i.e.: anaphylactic or anaphylactoid reaction(s)); adrenal axis suppression; blood sugar elevation that in diabetics may result in ketoacidosis or comma; water retention that in patients with history of congestive heart failure may result in shortness of breath, pulmonary edema, and decompensation with resultant heart failure; weight gain; swelling or edema; medication-induced neural toxicity; particulate matter embolism and blood vessel occlusion with resultant organ, and/or nervous system infarction; and/or aseptic necrosis of one or more joints. Finally, the patient was informed that Medicine is not an exact science; therefore, there is also the possibility of unforeseen or unpredictable risks and/or possible complications that may result in a catastrophic outcome. The patient indicated having understood very clearly. We have given the patient no guarantees and we have made no promises. Enough time was given to the patient to ask questions, all of which were answered to the patient's satisfaction. Sharon Rodriguez has indicated that she wanted to continue with the procedure. Attestation: I, the ordering provider, attest that I have discussed with the patient the benefits, risks, side-effects, alternatives, likelihood of achieving goals, and potential problems during recovery for the procedure that I have provided informed consent. Date  Time: 07/30/2017 11:05 AM  Pre-Procedure Preparation:  Monitoring: As per clinic protocol. Respiration, ETCO2, SpO2, BP, heart rate and rhythm monitor placed and checked for adequate function Safety Precautions: Patient was assessed for positional comfort and pressure points before starting the  procedure. Time-out: I initiated and conducted the "Time-out" before starting the procedure, as per protocol. The patient was asked to participate by confirming the accuracy of the "Time Out" information. Verification of the correct person, site, and procedure were performed and confirmed by me, the nursing staff, and the patient. "Time-out" conducted as per Joint Commission's Universal Protocol ([email or web address].01.01.01). Time: 1229  Description of Procedure:       Position: Prone Laterality: Bilateral. The procedure was performed in identical fashion on both sides. Levels:   L3, L4, L5,Medial Branch Level(s) Area Prepped: Posterior Lumbosacral Region Prepping solution: ChloraPrep (2% chlorhexidine gluconate and 70% isopropyl alcohol) Safety Precautions: Aspiration looking for blood return was conducted prior to all injections. At no point did we inject any substances, as a needle was being advanced. Before injecting, the patient was told to immediately notify me if she was experiencing any new onset of "ringing in the ears, or metallic taste in the mouth". No attempts were made at seeking any paresthesias. Safe injection practices and needle disposal techniques used. Medications properly checked for expiration dates. SDV (single dose vial) medications used. After the completion of the procedure, all disposable equipment used was discarded in the proper designated medical waste containers. Local Anesthesia: Protocol guidelines were followed. The patient was positioned over the fluoroscopy table. The area was prepped in the usual manner. The time-out was completed. The target area was identified using fluoroscopy. A 12-in long, straight, sterile hemostat was used with fluoroscopic guidance to locate the targets for each level blocked. Once located, the skin was marked with an approved surgical skin marker. Once all sites were marked, the skin (epidermis, dermis, and hypodermis), as well as deeper tissues (fat,  connective tissue and muscle) were infiltrated with a small amount of a short-acting local anesthetic, loaded on a 10cc syringe with a 25G, 1.5-in  Needle. An appropriate amount of time was allowed for local anesthetics to take effect before proceeding to the next step. Local Anesthetic: Lidocaine 1.0% The unused portion of the local anesthetic  was discarded in the proper designated containers. Technical explanation of process:   L3 Medial Branch Nerve Block (MBB): The target area for the L3 medial branch is at the junction of the postero-lateral aspect of the superior articular process and the superior, posterior, and medial edge of the transverse process of L4. Under fluoroscopic guidance, a Quincke needle was inserted until contact was made with os over the superior postero-lateral aspect of the pedicular shadow (target area). After negative aspiration for blood,49mL of the nerve block solution was injected without difficulty or complication. The needle was removed intact. L4 Medial Branch Nerve Block (MBB): The target area for the L4 medial branch is at the junction of the postero-lateral aspect of the superior articular process and the superior, posterior, and medial edge of the transverse process of L5. Under fluoroscopic guidance, a Quincke needle was inserted until contact was made with os over the superior postero-lateral aspect of the pedicular shadow (target area). After negative aspiration for blood, 1 mL of the nerve block solution was injected without difficulty or complication. The needle was removed intact. L5 Medial Branch Nerve Block (MBB): The target area for the L5 medial branch is at the junction of the postero-lateral aspect of the superior articular process and the superior, posterior, and medial edge of the sacral ala. Under fluoroscopic guidance, a Quincke needle was inserted until contact was made with os over the superior postero-lateral aspect of the pedicular shadow (target area).  After negative aspiration for blood,1 mL of the nerve block solution was injected without difficulty or complication. The needle was removed intact.  Procedural Needles: 22-gauge, 3.5-inch, Quincke needles used for all levels. Nerve block solution: 10 cc solution made of 9 cc of 0.2% ropivacaine, 1 cc of Decadron 10 mg/cc.  1 to 1.5 cc injected at each level above bilaterally.  The unused portion of the solution was discarded in the proper designated containers.  Once the entire procedure was completed, the treated area was cleaned, making sure to leave some of the prepping solution back to take advantage of its long term bactericidal properties.   Illustration of the posterior view of the lumbar spine and the posterior neural structures. Laminae of L2 through S1 are labeled. DPRL5, dorsal primary ramus of L5; DPRS1, dorsal primary ramus of S1; DPR3, dorsal primary ramus of L3; FJ, facet (zygapophyseal) joint L3-L4; I, inferior articular process of L4; LB1, lateral branch of dorsal primary ramus of L1; IAB, inferior articular branches from L3 medial branch (supplies L4-L5 facet joint); IBP, intermediate branch plexus; MB3, medial branch of dorsal primary ramus of L3; NR3, third lumbar nerve root; S, superior articular process of L5; SAB, superior articular branches from L4 (supplies L4-5 facet joint also); TP3, transverse process of L3.  Vitals:   07/30/17 1249 07/30/17 1258 07/30/17 1308 07/30/17 1318  BP: 105/72 (!) 111/58 (!) 112/56 107/60  Pulse:      Resp: 13 16 16 14   Temp:  (!) 97.2 F (36.2 C)  (!) 97.5 F (36.4 C)  TempSrc:      SpO2: 96% 95% 99% 100%  Weight:      Height:        Start Time: 1230 hrs. End Time: 1249 hrs.  Imaging Guidance (Spinal):  Type of Imaging Technique: Fluoroscopy Guidance (Spinal) Indication(s): Assistance in needle guidance and placement for procedures requiring needle placement in or near specific anatomical locations not easily accessible without such  assistance. Exposure Time: Please see nurses notes. Contrast: None used. Fluoroscopic  Guidance: I was personally present during the use of fluoroscopy. "Tunnel Vision Technique" used to obtain the best possible view of the target area. Parallax error corrected before commencing the procedure. "Direction-depth-direction" technique used to introduce the needle under continuous pulsed fluoroscopy. Once target was reached, antero-posterior, oblique, and lateral fluoroscopic projection used confirm needle placement in all planes. Images permanently stored in EMR. Interpretation: No contrast injected. I personally interpreted the imaging intraoperatively. Adequate needle placement confirmed in multiple planes. Permanent images saved into the patient's record.  Antibiotic Prophylaxis:   Anti-infectives (From admission, onward)   None     Indication(s): None identified  Post-operative Assessment:  Post-procedure Vital Signs:  Pulse/HCG Rate: 8071 Temp: (!) 97.5 F (36.4 C) Resp: 14 BP: 107/60 SpO2: 100 %  EBL: None  Complications: No immediate post-treatment complications observed by team, or reported by patient.  Note: The patient tolerated the entire procedure well. A repeat set of vitals were taken after the procedure and the patient was kept under observation following institutional policy, for this type of procedure. Post-procedural neurological assessment was performed, showing return to baseline, prior to discharge. The patient was provided with post-procedure discharge instructions, including a section on how to identify potential problems. Should any problems arise concerning this procedure, the patient was given instructions to immediately contact us, at any time, without hesitation. In any case, we plan to contact the patient by telephone for a follow-up status report regarding this interventional procedure.  Comments:  No additional relevant information.  Plan of Care    Imaging  Orders     DG C-Arm 1-60 Min-No Report Procedure Orders    No procedure(s) ordered today    Medications ordered for procedure: Meds ordered this encounter  Medications  . lactated ringers infusion 1,000 mL  . fentaNYL (SUBLIMAZE) injection 25-100 mcg    Make sure Narcan is available in the pyxis when using this medication. In the event of respiratory depression (RR< 8/min): Titrate NARCAN (naloxone) in increments of 0.1 to 0.2 mg IV at 2-3 minute intervals, until desired degree of reversal.  . lidocaine (XYLOCAINE) 1 % (with pres) injection 10 mL  . ropivacaine (PF) 2 mg/mL (0.2%) (NAROPIN) injection 10 mL  . dexamethasone (DECADRON) injection 10 mg   Medications administered: We administered lactated ringers, fentaNYL, lidocaine, ropivacaine (PF) 2 mg/mL (0.2%), and dexamethasone.  See the medical record for exact dosing, route, and time of administration.  New Prescriptions   No medications on file   Disposition: Discharge home  Discharge Date & Time: 07/30/2017; 1325 hrs.   Physician-requested Follow-up: Return in about 3 weeks (around 08/20/2017) for Post Procedure Evaluation.  Future Appointments  Date Time Provider [place]  08/09/2017 11:00 AM Valerie Roys, [place] [facility]  08/16/2017  1:30 PM Jonathon Bellows, MD [facility] None  08/21/2017 12:15 PM Gillis Santa, MD [facility] None  08/23/2017  1:00 PM Valerie Roys, DO [facility]   Primary Care Physician: Valerie Roys, DO Location: [facility] Note by: Gillis Santa, MD Date: 07/30/2017; Time: 1:54 PM  Disclaimer:  Medicine is not an exact science. The only guarantee in medicine is that nothing is guaranteed. It is important to note that the decision to proceed with this intervention was based on the information collected from the patient. The Data and conclusions were drawn from the patient's questionnaire, the interview, and the physical examination. Because the information was  provided in large part by the patient, it cannot be guaranteed that  it has not been purposely or unconsciously manipulated. Every effort has been made to obtain as much relevant data as possible for this evaluation. It is important to note that the conclusions that lead to this procedure are derived in large part from the available data. Always take into account that the treatment will also be dependent on availability of resources and existing treatment guidelines, considered by other Pain Management Practitioners as being common knowledge and practice, at the time of the intervention. For Medico-Legal purposes, it is also important to point out that variation in procedural techniques and pharmacological choices are the acceptable norm. The indications, contraindications, technique, and results of the above procedure should only be interpreted and judged by a Board-Certified Interventional Pain Specialist with extensive familiarity and expertise in the same exact procedure and technique.

## 2017-07-30 NOTE — Patient Instructions (Signed)

## 2017-07-31 ENCOUNTER — Telehealth: Payer: Self-pay

## 2017-07-31 NOTE — Telephone Encounter (Signed)
Post procedure phone call.  Left message.  

## 2017-08-05 LAB — COMPLIANCE DRUG ANALYSIS, UR

## 2017-08-09 ENCOUNTER — Ambulatory Visit: Payer: Medicare Other | Admitting: Family Medicine

## 2017-08-16 ENCOUNTER — Ambulatory Visit: Payer: Medicare Other | Admitting: Gastroenterology

## 2017-08-21 ENCOUNTER — Ambulatory Visit: Payer: Medicare Other | Admitting: Student in an Organized Health Care Education/Training Program

## 2017-08-22 DIAGNOSIS — F411 Generalized anxiety disorder: Secondary | ICD-10-CM | POA: Diagnosis not present

## 2017-08-22 DIAGNOSIS — F900 Attention-deficit hyperactivity disorder, predominantly inattentive type: Secondary | ICD-10-CM | POA: Diagnosis not present

## 2017-08-22 DIAGNOSIS — F330 Major depressive disorder, recurrent, mild: Secondary | ICD-10-CM | POA: Diagnosis not present

## 2017-08-23 ENCOUNTER — Ambulatory Visit (INDEPENDENT_AMBULATORY_CARE_PROVIDER_SITE_OTHER): Payer: Medicare Other | Admitting: Family Medicine

## 2017-08-23 ENCOUNTER — Encounter: Payer: Self-pay | Admitting: Family Medicine

## 2017-08-23 VITALS — BP 108/75 | HR 85 | Temp 97.9°F | Wt 197.1 lb

## 2017-08-23 DIAGNOSIS — M9905 Segmental and somatic dysfunction of pelvic region: Secondary | ICD-10-CM | POA: Diagnosis not present

## 2017-08-23 DIAGNOSIS — G8929 Other chronic pain: Secondary | ICD-10-CM

## 2017-08-23 DIAGNOSIS — M9908 Segmental and somatic dysfunction of rib cage: Secondary | ICD-10-CM | POA: Diagnosis not present

## 2017-08-23 DIAGNOSIS — R102 Pelvic and perineal pain unspecified side: Secondary | ICD-10-CM

## 2017-08-23 DIAGNOSIS — M545 Low back pain, unspecified: Secondary | ICD-10-CM

## 2017-08-23 DIAGNOSIS — M9901 Segmental and somatic dysfunction of cervical region: Secondary | ICD-10-CM

## 2017-08-23 DIAGNOSIS — M9904 Segmental and somatic dysfunction of sacral region: Secondary | ICD-10-CM

## 2017-08-23 DIAGNOSIS — M9903 Segmental and somatic dysfunction of lumbar region: Secondary | ICD-10-CM | POA: Diagnosis not present

## 2017-08-23 DIAGNOSIS — M9900 Segmental and somatic dysfunction of head region: Secondary | ICD-10-CM

## 2017-08-23 DIAGNOSIS — M9902 Segmental and somatic dysfunction of thoracic region: Secondary | ICD-10-CM

## 2017-08-23 DIAGNOSIS — M9909 Segmental and somatic dysfunction of abdomen and other regions: Secondary | ICD-10-CM | POA: Diagnosis not present

## 2017-08-23 MED ORDER — OXYCODONE-ACETAMINOPHEN 5-325 MG PO TABS: 1.0000 | ORAL_TABLET | Freq: Three times a day (TID) | ORAL | 0 refills | Status: DC | PRN

## 2017-08-23 NOTE — Assessment & Plan Note (Signed)
Patient followed up with pain management and had injections with them. She has seen the pain psychologist and has a follow up with pain management on 09/24/17.  Patient's pain stable today on current regimen.[place] PMP reviewed today and appropriate. Discussion had with patient again today regarding her pain management, we do short term pain management and were intending to bridge her until she got into pain management. However, it has been 11 months, and patient has not fully established with the local pain management. Discussed that we will start weaning her down on her medication. Will decrease her pain medicine from 1-2 tabs q6 hours with a total of 150/month to 1-2 tabs q8 hours with a total of 130/month. Rx given. To establish with pain management.

## 2017-08-23 NOTE — Progress Notes (Signed)
BP 108/75 (BP Location: Right Arm, Patient Position: Sitting, Cuff Size: Large)   Pulse 85   Temp 97.9 F (36.6 C)   Wt 197 lb 1 oz (89.4 kg)   SpO2 98%   BMI 37.23 kg/m    Subjective:    Patient ID: Sharon Rodriguez, female    DOB: 01/13/1981, 37 y.o.   MRN: [id number]  HPI: Sharon Rodriguez is a 37 y.o. female  Chief Complaint  Patient presents with  . Pain   CHRONIC PAIN- saw the pain psychologist and is going back to see the pain manamgent doctor in July. Had injections done about 3 weeks ago and they didn't really seem to help. She thinks that OMT does help more. She notes that she has been feeling significantly worse since not having a treatment last time. She is still not sure how long it lasts for, but would like to be treated again today. Present dose: 62.5 Morphine equivalents Pain control status: exacerbated Duration: chronic Location: widespread Quality: throbbing, tight, aching Current Pain Level: 5/10 Previous Pain Level: 6/10 Breakthrough pain: yes Benefit from narcotic medications: yes What Activities task can be accomplished with current medication? Interested in weaning off narcotics:no   Stool softners/OTC fiber: no  Previous pain specialty evaluation: yes Non-narcotic analgesic meds: no Narcotic contract: yes  Low back is acting up- has been working in the garden. Patient notes that her pain is in her low back. It's worse with movements and with working in the garden. Pain is aching and sore in nature. Radiates into her upper back. Better with medication. Pain is chronic in nature. Not much changes it. Gets a bit better for a short period of time with OMT. She is otherwise feeling well with no other concerns or complaints at this time.   Relevant past medical, surgical, family and social history reviewed and updated as indicated. Interim medical history since our last visit reviewed. Allergies and medications reviewed and updated.  Review of Systems    Constitutional: Negative.   Respiratory: Negative.   Cardiovascular: Negative.   Musculoskeletal: Positive for arthralgias, back pain, gait problem and myalgias. Negative for joint swelling, neck pain and neck stiffness.  Skin: Negative.   Neurological: Positive for weakness. Negative for dizziness, tremors, seizures, syncope, facial asymmetry, speech difficulty, light-headedness, numbness and headaches.  Psychiatric/Behavioral: Negative.     Per HPI unless specifically indicated above     Objective:    BP 108/75 (BP Location: Right Arm, Patient Position: Sitting, Cuff Size: Large)   Pulse 85   Temp 97.9 F (36.6 C)   Wt 197 lb 1 oz (89.4 kg)   SpO2 98%   BMI 37.23 kg/m   Wt Readings from Last 3 Encounters:  08/23/17 197 lb 1 oz (89.4 kg)  07/30/17 198 lb (89.8 kg)  07/25/17 196 lb 7 oz (89.1 kg)    Physical Exam  Constitutional: She is oriented to person, place, and time. She appears well-developed and well-nourished. No distress.  HENT:  Head: Normocephalic and atraumatic.  Right Ear: Hearing normal.  Left Ear: Hearing normal.  Nose: Nose normal.  Eyes: Conjunctivae and lids are normal. Right eye exhibits no discharge. Left eye exhibits no discharge. No scleral icterus.  Pulmonary/Chest: Effort normal. No respiratory distress.  Abdominal: Soft. Bowel sounds are normal. She exhibits no distension and no mass. There is no tenderness. There is no rebound and no guarding. No hernia.  Neurological: She is alert and oriented to person, place, and time.  Skin: Skin is warm, dry and intact. Capillary refill takes less than 2 seconds. No rash noted. She is not diaphoretic. No erythema. No pallor.  Psychiatric: She has a normal mood and affect. Her speech is normal and behavior is normal. Judgment and thought content normal. Cognition and memory are normal.  Nursing note and vitals reviewed. Musculoskeletal:  Exam found Decreased ROM, Tissue texture changes, Tenderness to palpation  and Asymmetry of patient's  head, neck, thorax, ribs, lumbar, pelvis, sacrum and abdomen Osteopathic Structural Exam:   Head: hypertonic suboccipital muscles, OAESSR, R temporal internally rotated and anterior  Neck: R SCM hypertonic on the R, C3ESRR, C4ESRL  Thorax: T3-6SLRR, trap spasm on the R  Ribs: Ribs 5-9 locked up on the R, Ribs 9-10 locked up on the L  Lumbar: QL hypertonic on the R, L3-4SLRR  Pelvis: Anterior R innominate   Sacrum: R on R torsion  Abdomen: pelvic diaphragm restricted and pulled in to the R hip.    Results for orders placed or performed in visit on 07/19/17  Compliance Drug Analysis, Ur  Result Value Ref Range   Summary FINAL       Assessment & Plan:   Problem List Items Addressed This Visit      Other   Chronic pain - Primary    Patient followed up with pain management and had injections with them. She has seen the pain psychologist and has a follow up with pain management on 09/24/17.  Patient's pain stable today on current regimen.[place] PMP reviewed today and appropriate. Discussion had with patient again today regarding her pain management, we do short term pain management and were intending to bridge her until she got into pain management. However, it has been 11 months, and patient has not fully established with the local pain management. Discussed that we will start weaning her down on her medication. Will decrease her pain medicine from 1-2 tabs q6 hours with a total of 150/month to 1-2 tabs q8 hours with a total of 130/month. Rx given. To establish with pain management.       Relevant Medications   oxyCODONE-acetaminophen (PERCOCET) 5-325 MG tablet   Chronic pelvic pain in female    Patient followed up with pain management and had injections with them. She has seen the pain psychologist and has a follow up with pain management on 09/24/17.  Patient's pain stable today on current regimen.[place] PMP reviewed today and appropriate. Discussion had with patient again  today regarding her pain management, we do short term pain management and were intending to bridge her until she got into pain management. However, it has been 11 months, and patient has not fully established with the local pain management. Discussed that we will start weaning her down on her medication. Will decrease her pain medicine from 1-2 tabs q6 hours with a total of 150/month to 1-2 tabs q8 hours with a total of 130/month. Rx given. To establish with pain management.       Relevant Medications   oxyCODONE-acetaminophen (PERCOCET) 5-325 MG tablet    Other Visit Diagnoses    Acute bilateral low back pain without sciatica       In excerbation due to her working in the garden. She does have some somatic dysfunction that I think is contributing to her symptoms. Treated today as below.   Relevant Medications   oxyCODONE-acetaminophen (PERCOCET) 5-325 MG tablet   Sacral region somatic dysfunction       Pelvic region somatic dysfunction  Rib cage region somatic dysfunction       Somatic dysfunction of spine, lumbar       Segmental dysfunction of abdomen       Thoracic region somatic dysfunction       Head region somatic dysfunction       Somatic dysfunction of cervical region         After verbal consent was obtained, patient was treated today with osteopathic manipulative medicine to the regions of the head, neck, thorax, ribs, lumbar, pelvis, sacrum and abdomen using the techniques of cranial, FPR, myofascial release, counterstrain, muscle energy, HVLA and soft tissue. Areas of compensation relating to her primary pain source also treated. Patient tolerated the procedure well with fair objective and fair subjective improvement in symptoms. She left the room in good condition. She was advised to stay well hydrated and that she may have some soreness following the procedure. If not improving or worsening, she will call and come in. She will return for reevaluation  on a PRN  basis.   Follow up plan: Return in about 1 month (around 09/20/2017) for Follow up pain if not established with pain medicine.

## 2017-08-27 ENCOUNTER — Encounter: Payer: Self-pay | Admitting: Family Medicine

## 2017-08-28 ENCOUNTER — Ambulatory Visit: Payer: Medicare Other | Admitting: Student in an Organized Health Care Education/Training Program

## 2017-08-29 ENCOUNTER — Encounter: Payer: Self-pay | Admitting: Family Medicine

## 2017-08-31 DIAGNOSIS — Z8739 Personal history of other diseases of the musculoskeletal system and connective tissue: Secondary | ICD-10-CM | POA: Diagnosis not present

## 2017-09-24 ENCOUNTER — Ambulatory Visit
Payer: Medicare Other | Attending: Student in an Organized Health Care Education/Training Program | Admitting: Student in an Organized Health Care Education/Training Program

## 2017-09-24 ENCOUNTER — Ambulatory Visit: Payer: Medicare Other | Admitting: Gastroenterology

## 2017-09-24 ENCOUNTER — Ambulatory Visit (INDEPENDENT_AMBULATORY_CARE_PROVIDER_SITE_OTHER): Payer: Medicare Other | Admitting: Gastroenterology

## 2017-09-24 ENCOUNTER — Encounter: Payer: Self-pay | Admitting: Gastroenterology

## 2017-09-24 ENCOUNTER — Encounter: Payer: Self-pay | Admitting: Student in an Organized Health Care Education/Training Program

## 2017-09-24 ENCOUNTER — Other Ambulatory Visit: Payer: Self-pay

## 2017-09-24 VITALS — BP 97/58 | HR 92 | Ht 61.0 in | Wt 195.8 lb

## 2017-09-24 VITALS — BP 111/58 | HR 112 | Temp 98.8°F | Resp 16 | Ht 61.0 in | Wt 195.0 lb

## 2017-09-24 DIAGNOSIS — R5382 Chronic fatigue, unspecified: Secondary | ICD-10-CM | POA: Diagnosis not present

## 2017-09-24 DIAGNOSIS — M797 Fibromyalgia: Secondary | ICD-10-CM | POA: Insufficient documentation

## 2017-09-24 DIAGNOSIS — M069 Rheumatoid arthritis, unspecified: Secondary | ICD-10-CM | POA: Diagnosis not present

## 2017-09-24 DIAGNOSIS — Z886 Allergy status to analgesic agent status: Secondary | ICD-10-CM | POA: Diagnosis not present

## 2017-09-24 DIAGNOSIS — M7918 Myalgia, other site: Secondary | ICD-10-CM | POA: Insufficient documentation

## 2017-09-24 DIAGNOSIS — M25561 Pain in right knee: Secondary | ICD-10-CM | POA: Insufficient documentation

## 2017-09-24 DIAGNOSIS — F419 Anxiety disorder, unspecified: Secondary | ICD-10-CM | POA: Diagnosis not present

## 2017-09-24 DIAGNOSIS — Z79899 Other long term (current) drug therapy: Secondary | ICD-10-CM | POA: Diagnosis not present

## 2017-09-24 DIAGNOSIS — M25562 Pain in left knee: Secondary | ICD-10-CM | POA: Diagnosis not present

## 2017-09-24 DIAGNOSIS — R1011 Right upper quadrant pain: Secondary | ICD-10-CM

## 2017-09-24 DIAGNOSIS — R102 Pelvic and perineal pain: Secondary | ICD-10-CM | POA: Diagnosis not present

## 2017-09-24 DIAGNOSIS — E559 Vitamin D deficiency, unspecified: Secondary | ICD-10-CM | POA: Insufficient documentation

## 2017-09-24 DIAGNOSIS — R109 Unspecified abdominal pain: Secondary | ICD-10-CM

## 2017-09-24 DIAGNOSIS — M26629 Arthralgia of temporomandibular joint, unspecified side: Secondary | ICD-10-CM | POA: Diagnosis not present

## 2017-09-24 DIAGNOSIS — K760 Fatty (change of) liver, not elsewhere classified: Secondary | ICD-10-CM

## 2017-09-24 DIAGNOSIS — Z79891 Long term (current) use of opiate analgesic: Secondary | ICD-10-CM | POA: Diagnosis not present

## 2017-09-24 DIAGNOSIS — E039 Hypothyroidism, unspecified: Secondary | ICD-10-CM | POA: Insufficient documentation

## 2017-09-24 DIAGNOSIS — M545 Low back pain: Secondary | ICD-10-CM | POA: Insufficient documentation

## 2017-09-24 DIAGNOSIS — M47816 Spondylosis without myelopathy or radiculopathy, lumbar region: Secondary | ICD-10-CM | POA: Diagnosis not present

## 2017-09-24 DIAGNOSIS — R197 Diarrhea, unspecified: Secondary | ICD-10-CM

## 2017-09-24 DIAGNOSIS — F339 Major depressive disorder, recurrent, unspecified: Secondary | ICD-10-CM | POA: Insufficient documentation

## 2017-09-24 DIAGNOSIS — G894 Chronic pain syndrome: Secondary | ICD-10-CM | POA: Insufficient documentation

## 2017-09-24 MED ORDER — OXYCODONE-ACETAMINOPHEN 5-325 MG PO TABS: 1.0000 | ORAL_TABLET | Freq: Four times a day (QID) | ORAL | 0 refills | Status: DC | PRN

## 2017-09-24 MED ORDER — OMEPRAZOLE 40 MG PO CPDR: 40.0000 mg | DELAYED_RELEASE_CAPSULE | Freq: Every day | ORAL | 2 refills | Status: DC

## 2017-09-24 NOTE — Patient Instructions (Signed)
You got 2 prescriptions today for Oxycodone.  Stop by to get x-rays done for left and right knee.

## 2017-09-24 NOTE — Progress Notes (Signed)
Jonathon Bellows MD, MRCP(U.K) [address]  [place]  [place], [place] [address]  Main: [phone]  Fax: [phone]   Primary Care Physician: Valerie Roys, DO  Primary Gastroenterologist:  Dr. Jonathon Bellows   Chief Complaint  Patient presents with  . New Patient (Initial Visit)    diarrhea, ref. Dr. Jeananne Rama    HPI: Sharon Rodriguez is a 37 y.o. female  Summary of history :  She is here today for a follow up to her last initial visit back in 03/2017 when she was seen for abdominal pain RUQ pain  . The pain started early 2019 , on and off, most days of the week, can last for hours, wakes her up from her sleep. Sharp in nature, had been on naprosyn long term, mother had issues with gall bladder. She also had some diarrhea , gas,bloating,she consumed 3 glasses of sweet tea aday .  RUQ USG 03/2017 showed no abnormality except fatty infiltration    Interval history   03/31/2017-  09/24/2017  Labs 04/2017: stool H pylori antigen ,  Calpropectin, c diff, GI PCR-  negative, Lipase normal   04/2017 : CT abdomen : fatty liver , adhesions from C section   Says the diarrhea is better, gained weight . Stopped all NSAID's . Stopped all sweet tea. Consumes a few glasses of cool air, water flavorings.   Still having abdominal pain , sharp in nature, burning in nature, needles in the upper part of her abdomen and RUQ "always hard in nature" worse during meals.    Current Outpatient Medications  Medication Sig Dispense Refill  . albuterol (PROVENTIL) (2.5 MG/3ML) 0.083% nebulizer solution Frequency:Q4HPRN   Dosage:0.0     Instructions:  Note:Dose: 1  Dx:J42 75 mL 12  . diclofenac (VOLTAREN) 75 MG EC tablet Take 1 tablet (75 mg total) by mouth 2 (two) times daily. 60 tablet 0  . dicyclomine (BENTYL) 10 MG capsule Take 1 capsule (10 mg total) by mouth 4 (four) times daily -  before meals and at bedtime for 14 days. 56 capsule 0  . levothyroxine (SYNTHROID, LEVOTHROID) 50 MCG tablet Take 1  tablet (50 mcg total) by mouth daily before breakfast. NAME BRAND ONLY PLEASE 90 tablet 3  . MYDAYIS 25 MG CP24   0  . omeprazole (PRILOSEC) 40 MG capsule     . oxyCODONE-acetaminophen (PERCOCET) 5-325 MG tablet Take 1 tablet by mouth 4 (four) times daily as needed for severe pain. For chronic pain To last for 30 days from fill date To fill on or after: 09/24/17, 10/24/17 120 tablet 0  . Vitamin D, Ergocalciferol, (DRISDOL) 50000 units CAPS capsule Take 1 capsule (50,000 Units total) by mouth every 7 (seven) days. 16 capsule 0   No current facility-administered medications for this visit.     Allergies as of 09/24/2017 - Review Complete 09/24/2017  Allergen Reaction Noted  . Ibuprofen Diarrhea and Nausea Only 01/27/2015  . Promethazine Rash 01/27/2015    ROS:  General: Negative for anorexia, weight loss, fever, chills, fatigue, weakness. ENT: Negative for hoarseness, difficulty swallowing , nasal congestion. CV: Negative for chest pain, angina, palpitations, dyspnea on exertion, peripheral edema.  Respiratory: Negative for dyspnea at rest, dyspnea on exertion, cough, sputum, wheezing.  GI: See history of present illness. GU:  Negative for dysuria, hematuria, urinary incontinence, urinary frequency, nocturnal urination.  Endo: Negative for unusual weight change.    Physical Examination:   There were no vitals taken for this visit.  General: Well-nourished, well-developed in no acute distress.  Eyes: No icterus. Conjunctivae pink. Mouth: Oropharyngeal mucosa moist and pink , no lesions erythema or exudate. Lungs: Clear to auscultation bilaterally. Non-labored. Heart: Regular rate and rhythm, no murmurs rubs or gallops.  Abdomen: Bowel sounds are normal, nontender, nondistended, no hepatosplenomegaly or masses, no abdominal bruits or hernia , no rebound or guarding.   Extremities: No lower extremity edema. No clubbing or deformities. Neuro: Alert and oriented x 3.  Grossly  intact. Skin: Warm and dry, no jaundice.   Psych: Alert and cooperative, normal mood and affect.   Imaging Studies: No results found.  Assessment and Plan:   Sharon Rodriguez is a 37 y.o. y/o female here to follow up for abdominal pain and diarrhea. 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 Abdominal pain could be functional or from gall bladder or from gastritis.   Plan  1. Commence on PPI 2. Stop all flavored drinks as it can cause and osmotic diarrhea 3. EGD and HIDA scan 4. Suggest weight loss and counseled on healthy eating  5. IB guard.  6. She would like to be seen by a dietician as her dietary habits could be modified with decrease in caloric input   I have discussed alternative options, risks & benefits,  which include, but are not limited to, bleeding, infection, perforation,respiratory complication & drug reaction.  The patient agrees with this plan & written consent will be obtained.    Dr Jonathon Bellows  MD,MRCP [facility]) Follow up in 3 months

## 2017-09-24 NOTE — Progress Notes (Signed)
Safety precautions to be maintained throughout the outpatient stay will include: orient to surroundings, keep bed in low position, maintain call bell within reach at all times, provide assistance with transfer out of bed and ambulation.  

## 2017-09-24 NOTE — Progress Notes (Signed)
Patient's Name: Sharon Rodriguez  MRN: [id number]  Referring Provider: Valerie Roys, DO  DOB: 24-Jun-1980  PCP: Valerie Roys, DO  DOS: 09/24/2017  Note by: Gillis Santa, MD  Service setting: Ambulatory outpatient  Specialty: Interventional Pain Management  Location: [facility] (AMB) Pain Management Facility    Patient type: Established   Primary Reason(s) for Visit: Encounter for post-procedure evaluation of chronic illness with mild to moderate exacerbation CC: Follow-up (procedure ); Back Pain (lower); and Knee Pain (bilateral)  HPI  Sharon Rodriguez is a 37 y.o. year old, female patient, who comes today for a post-procedure evaluation. She has Hypothyroidism; Anxiety; Rheumatoid arthritis ([place]); Depression; Fibromyalgia; TMJ pain dysfunction syndrome; Chronic bronchitis ([place]); Fatigue; Chronic pain; ADD (attention deficit disorder); Learning disability; Chronic pelvic pain in female; Lower limb length difference; Vitamin D deficiency; Controlled substance agreement signed; Acne; Benign neoplasm of skin of trunk; H/O juvenile rheumatoid arthritis; Chronic pain disorder; Spondylosis without myelopathy or radiculopathy, lumbar region; and Myofascial pain on their problem list. Her primarily concern today is the Follow-up (procedure ); Back Pain (lower); and Knee Pain (bilateral)  Pain Assessment: Location: Lower Back Radiating: denies Onset: More than a month ago Duration: Chronic pain Quality: Aching(stinging) Severity: 7 /10 (subjective, self-reported pain score)  Note: Reported level is inconsistent with clinical observations. Clinically the patient looks like a 3/10 A 3/10 is viewed as "Moderate" and described as significantly interfering with activities of daily living (ADL). It becomes difficult to feed, bathe, get dressed, get on and off the toilet or to perform personal hygiene functions. Difficult to get in and out of bed or a chair without assistance. Very distracting. With effort, it can be  ignored when deeply involved in activities. Information on the proper use of the pain scale provided to the patient today. When using our objective Pain Scale, levels between 6 and 10/10 are said to belong in an emergency room, as it progressively worsens from a 6/10, described as severely limiting, requiring emergency care not usually available at an outpatient pain management facility. At a 6/10 level, communication becomes difficult and requires great effort. Assistance to reach the emergency department may be required. Facial flushing and profuse sweating along with potentially dangerous increases in heart rate and blood pressure will be evident. Effect on ADL:   Timing: Intermittent Modifying factors: relaxing, medications, stretching, ice BP: (!) 111/58  HR: (!) 112  Sharon Rodriguez comes in today for post-procedure evaluation after the treatment done on 07/31/2017.  Further details on both, my assessment(s), as well as the proposed treatment plan, please see below.  Post-Procedure Assessment  07/30/2017 Procedure: Bilateral L3, L4, L5 lumbar facet medial branch nerve block #2  Influential Factors: BMI: 36.84 kg/m Intra-procedural challenges: None observed.         Assessment challenges: None detected.              Reported side-effects: None.        Post-procedural adverse reactions or complications: None reported         Sedation: Please see nurses note. When no sedatives are used, the analgesic levels obtained are directly associated to the effectiveness of the local anesthetics. However, when sedation is provided, the level of analgesia obtained during the initial 1 hour following the intervention, is believed to be the result of a combination of factors. These factors may include, but are not limited to: 1. The effectiveness of the local anesthetics used. 2. The effects of the analgesic(s) and/or anxiolytic(s) used. 3.  The degree of discomfort experienced by the patient at the time of the  procedure. 4. The patients ability and reliability in recalling and recording the events. 5. The presence and influence of possible secondary gains and/or psychosocial factors. Reported result: Relief experienced during the 1st hour after the procedure: 0 % (Ultra-Short Term Relief)            Interpretative annotation: Unexpected result. Analgesia during this period is likely to be Local Anesthetic and/or IV Sedative (Analgesic/Anxiolytic) related.          Effects of local anesthetic: The analgesic effects attained during this period are directly associated to the localized infiltration of local anesthetics and therefore cary significant diagnostic value as to the etiological location, or anatomical origin, of the pain. Expected duration of relief is directly dependent on the pharmacodynamics of the local anesthetic used. Long-acting (4-6 hours) anesthetics used.  Reported result: Relief during the next 4 to 6 hour after the procedure: 0 % (Short-Term Relief)            Interpretative annotation: Unexpected result. Analgesia during this period is likely to be Local Anesthetic-related.          Long-term benefit: Defined as the period of time past the expected duration of local anesthetics (1 hour for short-acting and 4-6 hours for long-acting). With the possible exception of prolonged sympathetic blockade from the local anesthetics, benefits during this period are typically attributed to, or associated with, other factors such as analgesic sensory neuropraxia, antiinflammatory effects, or beneficial biochemical changes provided by agents other than the local anesthetics.  Reported result: Extended relief following procedure: 0 % (Long-Term Relief)            Interpretative annotation: Unexpected result. No benefit. Therapeutic failure. No significant inflammatory component detected.          Current benefits: Defined as reported results that persistent at this point in time.   Analgesia: 0 %             Function: No benefit ROM: No benefit Interpretative annotation: No benefit. Therapeutic failure. Results would argue against repeating therapy.          Interpretation: Results would suggest therapy to have a limited impact on the patient's condition.                  Plan:  Please see "Plan of Care" for details.                Laboratory Chemistry  Inflammation Markers (CRP: Acute Phase) (ESR: Chronic Phase) No results found for: CRP, ESRSEDRATE, LATICACIDVEN                       Rheumatology Markers Lab Results  Component Value Date   LYMEIGGIGMAB <0.91 07/06/2016   LYMEABIGMQN <0.80 07/06/2016                        Renal Function Markers Lab Results  Component Value Date   BUN 14 03/20/2013   CREATININE 0.86 03/20/2013   GFRAA >60 03/20/2013   GFRNONAA >60 03/20/2013                             Hepatic Function Markers Lab Results  Component Value Date   AST 48 (H) 04/10/2017   ALT 28 04/10/2017   ALBUMIN 4.2 04/10/2017   ALKPHOS 63 04/10/2017   LIPASE 43  04/10/2017                        Electrolytes Lab Results  Component Value Date   NA 137 03/20/2013   K 3.6 03/20/2013   CL 103 03/20/2013   CALCIUM 8.9 03/20/2013                        Neuropathy Markers No results found for: VITAMINB12, FOLATE, HGBA1C, HIV                      Bone Pathology Markers Lab Results  Component Value Date   VD25OH 18.4 (L) 10/03/2016                         Coagulation Parameters Lab Results  Component Value Date   PLT 272 01/30/2017                        Cardiovascular Markers Lab Results  Component Value Date   TROPONINI < 0.02 03/20/2013   HGB 12.4 01/30/2017   HCT 37.7 01/30/2017                         CA Markers No results found for: CEA, CA125, LABCA2                      Note: Lab results reviewed.  Recent Diagnostic Imaging Results  DG C-Arm 1-60 Min-No Report Fluoroscopy was utilized by the requesting physician.  No radiographic   interpretation.   Complexity Note: Imaging results reviewed. Results shared with Sharon Rodriguez, using Layman's terms.                         Meds   Current Outpatient Medications:  .  albuterol (PROVENTIL) (2.5 MG/3ML) 0.083% nebulizer solution, Frequency:Q4HPRN   Dosage:0.0     Instructions:  Note:Dose: 1  Dx:J42, Disp: 75 mL, Rfl: 12 .  diclofenac (VOLTAREN) 75 MG EC tablet, Take 1 tablet (75 mg total) by mouth 2 (two) times daily., Disp: 60 tablet, Rfl: 0 .  levothyroxine (SYNTHROID, LEVOTHROID) 50 MCG tablet, Take 1 tablet (50 mcg total) by mouth daily before breakfast. NAME BRAND ONLY PLEASE, Disp: 90 tablet, Rfl: 3 .  MYDAYIS 25 MG CP24, , Disp: , Rfl: 0 .  oxyCODONE-acetaminophen (PERCOCET) 5-325 MG tablet, Take 1 tablet by mouth 4 (four) times daily as needed for severe pain. For chronic pain To last for 30 days from fill date To fill on or after: 09/24/17, 10/24/17, Disp: 120 tablet, Rfl: 0 .  Vitamin D, Ergocalciferol, (DRISDOL) 50000 units CAPS capsule, Take 1 capsule (50,000 Units total) by mouth every 7 (seven) days., Disp: 16 capsule, Rfl: 0 .  dicyclomine (BENTYL) 10 MG capsule, Take 1 capsule (10 mg total) by mouth 4 (four) times daily -  before meals and at bedtime for 14 days., Disp: 56 capsule, Rfl: 0 .  omeprazole (PRILOSEC) 40 MG capsule, Take 1 capsule (40 mg total) by mouth daily., Disp: 30 capsule, Rfl: 2  ROS  Constitutional: Denies any fever or chills Gastrointestinal: No reported hemesis, hematochezia, vomiting, or acute GI distress Musculoskeletal: Denies any acute onset joint swelling, redness, loss of ROM, or weakness Neurological: No reported episodes of acute onset apraxia, aphasia, dysarthria, agnosia, amnesia, paralysis, loss of coordination, or loss  of consciousness  Allergies  Sharon Rodriguez is allergic to ibuprofen and promethazine.  PFSH  Drug: Sharon Rodriguez  reports that she does not use drugs. Alcohol:  reports that she does not drink alcohol. Tobacco:   reports that she has never smoked. She has never used smokeless tobacco. Medical:  has a past medical history of ADD (attention deficit disorder), Anxiety, Chronic bronchitis ([place]), Chronic fatigue, Chronic pain, Depression, Fibromyalgia, History of stomach ulcers (2019), Hypothyroidism (01/27/2015), Learning disability, Osteoarthritis, Rheumatoid arthritis ([place]), Sleep apnea, Thyroid disease, and TMJ (dislocation of temporomandibular joint). Surgical: Sharon Rodriguez  has a past surgical history that includes Cesarean section. Family: family history includes ADD / ADHD in her daughter and mother; Allergies in her daughter and daughter; Arthritis in her brother and paternal grandmother; COPD in her mother; Cancer in her maternal grandmother; Dementia in her paternal grandmother; Diabetes in her father and mother; Heart attack in her maternal grandfather; Heart disease in her maternal grandfather; Hyperlipidemia in her father; Learning disabilities in her mother; Mental illness in her daughter, father, and sister; Stroke in her maternal grandfather; Thyroid disease in her daughter.  Constitutional Exam  General appearance: Well nourished, well developed, and well hydrated. In no apparent acute distress Vitals:   09/24/17 1304  BP: (!) 111/58  Pulse: (!) 112  Resp: 16  Temp: 98.8 F (37.1 C)  TempSrc: Oral  SpO2: 100%  Weight: 195 lb (88.5 kg)  Height: 5' 1" (1.549 m)   BMI Assessment: Estimated body mass index is 36.84 kg/m as calculated from the following:   Height as of this encounter: 5' 1" (1.549 m).   Weight as of this encounter: 195 lb (88.5 kg).  BMI interpretation table: BMI level Category Range association with higher incidence of chronic pain  <18 kg/m2 Underweight   18.5-24.9 kg/m2 Ideal body weight   25-29.9 kg/m2 Overweight Increased incidence by 20%  30-34.9 kg/m2 Obese (Class I) Increased incidence by 68%  35-39.9 kg/m2 Severe obesity (Class II) Increased incidence by 136%  >40  kg/m2 Extreme obesity (Class III) Increased incidence by 254%   Patient's current BMI Ideal Body weight  Body mass index is 36.84 kg/m. Ideal body weight: 47.8 kg (105 lb 6.1 oz) Adjusted ideal body weight: 64.1 kg (141 lb 3.6 oz)   BMI Readings from Last 4 Encounters:  09/24/17 37.00 kg/m  09/24/17 36.84 kg/m  08/23/17 37.23 kg/m  07/30/17 37.41 kg/m   Wt Readings from Last 4 Encounters:  09/24/17 195 lb 12.8 oz (88.8 kg)  09/24/17 195 lb (88.5 kg)  08/23/17 197 lb 1 oz (89.4 kg)  07/30/17 198 lb (89.8 kg)  Psych/Mental status: Alert, oriented x 3 (person, place, & time)       Eyes: PERLA Respiratory: No evidence of acute respiratory distress  Cervical Spine Area Exam  Skin & Axial Inspection: No masses, redness, edema, swelling, or associated skin lesions Alignment: Symmetrical Functional ROM: Unrestricted ROM      Stability: No instability detected Muscle Tone/Strength: Functionally intact. No obvious neuro-muscular anomalies detected. Sensory (Neurological): Unimpaired Palpation: No palpable anomalies              Upper Extremity (UE) Exam    Side: Right upper extremity  Side: Left upper extremity  Skin & Extremity Inspection: Skin color, temperature, and hair growth are WNL. No peripheral edema or cyanosis. No masses, redness, swelling, asymmetry, or associated skin lesions. No contractures.  Skin & Extremity Inspection: Skin color, temperature, and hair growth are WNL. No peripheral  edema or cyanosis. No masses, redness, swelling, asymmetry, or associated skin lesions. No contractures.  Functional ROM: Unrestricted ROM          Functional ROM: Unrestricted ROM          Muscle Tone/Strength: Functionally intact. No obvious neuro-muscular anomalies detected.  Muscle Tone/Strength: Functionally intact. No obvious neuro-muscular anomalies detected.  Sensory (Neurological): Unimpaired          Sensory (Neurological): Unimpaired          Palpation: No palpable anomalies               Palpation: No palpable anomalies              Provocative Test(s):  Phalen's test: deferred Tinel's test: deferred Apley's scratch test (Rodriguez opposite shoulder):  Action 1 (Across chest): deferred Action 2 (Overhead): deferred Action 3 (LB reach): deferred   Provocative Test(s):  Phalen's test: deferred Tinel's test: deferred Apley's scratch test (Rodriguez opposite shoulder):  Action 1 (Across chest): deferred Action 2 (Overhead): deferred Action 3 (LB reach): deferred    Thoracic Spine Area Exam  Skin & Axial Inspection: No masses, redness, or swelling Alignment: Symmetrical Functional ROM: Unrestricted ROM Stability: No instability detected Muscle Tone/Strength: Functionally intact. No obvious neuro-muscular anomalies detected. Sensory (Neurological): Unimpaired Muscle strength & Tone: No palpable anomalies  Lumbar Spine Area Exam  Skin & Axial Inspection: No masses, redness, or swelling Alignment: Symmetrical Functional ROM: Decreased ROM       Stability: No instability detected Muscle Tone/Strength: Functionally intact. No obvious neuro-muscular anomalies detected. Sensory (Neurological): Articular pain pattern Palpation: Complains of area being tender to palpation Bilateral Fist Percussion Test Provocative Tests: Lumbar Hyperextension and rotation test: Positive bilaterally for facet joint pain. Lumbar Lateral bending test: Positive due to pain. Patrick's Maneuver: evaluation deferred today                    Gait & Posture Assessment  Ambulation: Unassisted Gait: Relatively normal for age and body habitus Posture: WNL   Lower Extremity Exam    Side: Right lower extremity  Side: Left lower extremity  Stability: No instability observed          Stability: No instability observed          Skin & Extremity Inspection: Skin color, temperature, and hair growth are WNL. No peripheral edema or cyanosis. No masses, redness, swelling, asymmetry, or associated  skin lesions. No contractures.  Skin & Extremity Inspection: Skin color, temperature, and hair growth are WNL. No peripheral edema or cyanosis. No masses, redness, swelling, asymmetry, or associated skin lesions. No contractures.  Functional ROM: Unrestricted ROM                  Functional ROM: Unrestricted ROM                  Muscle Tone/Strength: Functionally intact. No obvious neuro-muscular anomalies detected.  Muscle Tone/Strength: Functionally intact. No obvious neuro-muscular anomalies detected.  Sensory (Neurological): Unimpaired  Sensory (Neurological): Unimpaired  Palpation: No palpable anomalies  Palpation: No palpable anomalies   Assessment  Primary Diagnosis & Pertinent Problem List: The primary encounter diagnosis was Spondylosis without myelopathy or radiculopathy, lumbar region. Diagnoses of Rheumatoid arthritis, involving unspecified site, unspecified rheumatoid factor presence (HCC), Chronic fatigue, Chronic pain syndrome, TMJ pain dysfunction syndrome, Fibromyalgia, Myofascial pain, Recurrent major depressive disorder, remission status unspecified ([place]), and Controlled substance agreement signed were also pertinent to this visit.  Status Diagnosis  Controlled Controlled  Controlled 1. Spondylosis without myelopathy or radiculopathy, lumbar region   2. Rheumatoid arthritis, involving unspecified site, unspecified rheumatoid factor presence ([place])   3. Chronic fatigue   4. Chronic pain syndrome   5. TMJ pain dysfunction syndrome   6. Fibromyalgia   7. Myofascial pain   8. Recurrent major depressive disorder, remission status unspecified ([place])   9. Controlled substance agreement signed     Problems updated and reviewed during this visit: Problem  Spondylosis Without Myelopathy Or Radiculopathy, Lumbar Region  Myofascial Pain  Controlled Substance Agreement Signed    General Recommendations: The pain condition that the patient suffers from is best treated with a  multidisciplinary approach that involves an increase in physical activity to prevent de-conditioning and worsening of the pain cycle, as well as psychological counseling (formal and/or informal) to address the co-morbid psychological affects of pain. Treatment will often involve judicious use of pain medications and interventional procedures to decrease the pain, allowing the patient to participate in the physical activity that will ultimately produce long-lasting pain reductions. The goal of the multidisciplinary approach is to return the patient to a higher level of overall function and to restore their ability to perform activities of daily living.  37 year old female with a history of ADD, rheumatoid arthritis, fibromyalgia, chronic fatigue syndrome, depression who presents with diffuse whole body pain most pronounced in her joints including shoulders wrists, knee, hip, and axial low back pain with radiation to her right side. Patient also has a history of pelvic pain. She has been working with physical therapy which she states has been helping for her pelvic pain. Patient was previously seen by [facility] rheumatology for many years who managed her opioid therapy which includes oxycodone 5 mg 1-2 tablets every 6 hours as needed for severe pain, quantity 150 month.  This is currently being prescribed by her primary care provider.  Patient follows up today after her second set of lumbar diagnostic facet medial branch nerve blocks L3, L4, L5.  Surprisingly, the second set of facet blocks did not provide her with nearly the same extent of pain relief as her first set did.  Her first set were done in October so her symptoms could have been much worse when we did the block in May.  However unable to clearly explain why the block did not provide a significant pain relief on the second set as it did on the first set.  I offer the patient a third set of diagnostic facet blocks to determine whether radiofrequency ablation  could be helpful but the patient is not interested.  Patient's chronic opioid therapy has been weaned from quantity 150/month to 130/month.  Patient has completed her psych assessment and her UDS is appropriate and within normal limits.  She has been reminded that she must refrain from any benzodiazepine medications while she is under our care and has a pain contract with our clinic.  Today I will reduce her quantity to 54-monthto keep her dose of 5 mg of Percocet the same.  Patient is also endorsing bilateral knee pain.  Will obtain bilateral knee x-rays to evaluate for degenerative joint disease as well as knee osteoarthritis.  Plan: -Sign opiate agreement -Reminded patient that she must refrain from any and all benzodiazepines while she is on chronic opioid therapy under our care.  -Prescription for Percocet as below.  Monthly quantity reduced from 130 to 120/month.  Prescription provided for 2 months. -Can consider repeat lumbar facet medial branch nerve block #3  if patient is interested. -Continue PT exercises at home   Plan of Care  Pharmacotherapy (Medications Ordered): Meds ordered this encounter  Medications  . DISCONTD: oxyCODONE-acetaminophen (PERCOCET) 5-325 MG tablet    Sig: Take 1 tablet by mouth 4 (four) times daily as needed for severe pain. For chronic pain To last for 30 days from fill date To fill on or after: 09/24/17, 10/24/17    Dispense:  120 tablet    Refill:  0  . oxyCODONE-acetaminophen (PERCOCET) 5-325 MG tablet    Sig: Take 1 tablet by mouth 4 (four) times daily as needed for severe pain. For chronic pain To last for 30 days from fill date To fill on or after: 09/24/17, 10/24/17    Dispense:  120 tablet    Refill:  0   Lab-work, procedure(s), and/or referral(s): Orders Placed This Encounter  Procedures  . DG Knee 1-2 Views Left  . DG Knee 1-2 Views Right    Provider-requested follow-up: Return in about 8 weeks (around 11/19/2017) for MM with  [place].  Time Note: Greater than 50% of the 25 minute(s) of face-to-face time spent with Sharon Rodriguez, was spent in counseling/coordination of care regarding: Sharon Rodriguez primary cause of pain, the results of her recent test(s), the treatment plan, treatment alternatives and the risks and possible complications of proposed treatment, opiate agreement, responsibilities of patient while under pain contract with our clinic  Future Appointments  Date Time Provider [place]  10/02/2017  1:15 PM Valerie Roys, DO [facility]  12/25/2017  1:15 PM Jonathon Bellows, MD [facility] None    Primary Care Physician: Valerie Roys, DO Location: [facility] Outpatient Pain Management Facility Note by: Gillis Santa, M.D Date: 09/24/2017; Time: 3:57 PM  Patient Instructions  You got 2 prescriptions today for Oxycodone.  Stop by to get x-rays done for left and right knee.

## 2017-09-25 ENCOUNTER — Ambulatory Visit: Payer: Medicare Other | Admitting: Family Medicine

## 2017-09-26 ENCOUNTER — Telehealth: Payer: Self-pay | Admitting: Family Medicine

## 2017-09-26 NOTE — Telephone Encounter (Signed)
Copied from [employer]. Topic: Quick Communication - Rx Refill/Question >> Sep 26, 2017  2:51 PM Margot Ables wrote: Medication: brand name synthroid - must be brand because it works best for her - pt has 3 days left Has the patient contacted their pharmacy? Yes - pt states the pharmacy faxed request last week Preferred Pharmacy (with phone number or street name): [facility]

## 2017-09-27 ENCOUNTER — Other Ambulatory Visit: Payer: Self-pay | Admitting: *Deleted

## 2017-09-27 ENCOUNTER — Telehealth: Payer: Self-pay

## 2017-09-27 MED ORDER — LEVOTHYROXINE SODIUM 50 MCG PO TABS: 50.0000 ug | ORAL_TABLET | Freq: Every day | ORAL | 0 refills | Status: DC

## 2017-09-27 NOTE — Telephone Encounter (Signed)
Rx refilled per protocol- last TSH 01/30/17       #90 with no additional

## 2017-09-27 NOTE — Telephone Encounter (Signed)
I left message for pt to contact office to schedule her procedure/test. (needs to schedule EGD-for abd pain, call: [phone] to schedule HIDA scan) and that insurance does not cover nutritionist unless this meets certain criteria such as diabetic, overweight, renal disease, etc. Per [place]). The cost of this is $260 per hour and pt has to sigh a Estate agent. In the fine print medicare will pay for a nutrionist in a physician's office, not in an outpatient setting and there is no one here that does this any longer.)

## 2017-10-01 ENCOUNTER — Telehealth: Payer: Self-pay | Admitting: Gastroenterology

## 2017-10-01 NOTE — Telephone Encounter (Signed)
Patient called and left voicemail stating the IBgard medication worked and would like a prescription called into [facility]. Patient was notified that a message would be sent to the nurse.

## 2017-10-01 NOTE — Telephone Encounter (Signed)
Returned patients call leaving a voice message to let her know that IBGuard is available over the counter.  No prescription is needed, however if she has any problems finding them in stores to let us know.  Thanks [employer]

## 2017-10-02 ENCOUNTER — Ambulatory Visit: Payer: Medicare Other | Admitting: Family Medicine

## 2017-10-10 ENCOUNTER — Telehealth: Payer: Self-pay

## 2017-10-10 DIAGNOSIS — G8929 Other chronic pain: Secondary | ICD-10-CM

## 2017-10-10 DIAGNOSIS — G894 Chronic pain syndrome: Secondary | ICD-10-CM

## 2017-10-10 DIAGNOSIS — M069 Rheumatoid arthritis, unspecified: Secondary | ICD-10-CM

## 2017-10-10 DIAGNOSIS — M797 Fibromyalgia: Secondary | ICD-10-CM

## 2017-10-10 NOTE — Telephone Encounter (Signed)
Copied from [place] [id number]. Topic: Referral - Request >> Oct 10, 2017  9:47 AM Bea Graff, NT wrote: Reason for CRM: Pt requesting to be referred to [facility]. Fax#: [phone]

## 2017-10-11 NOTE — Telephone Encounter (Signed)
We have called to see if they are taking new patients, they were not last time we called them. Awaiting call back.

## 2017-10-11 NOTE — Telephone Encounter (Signed)
Called and left a detailed message letting patient know that we are waiting for a return call.  If patient ask, she will have to continue to see [facility] pain clinic until she can get into a new clinic for her pain medications.

## 2017-10-16 NOTE — Telephone Encounter (Signed)
Pt called to state that the pain clinic would see them if a referral was put in by the doctor. Please advise Cb#[phone]

## 2017-10-16 NOTE — Telephone Encounter (Signed)
Please see message and advise.  Thank you. ° °

## 2017-10-17 NOTE — Telephone Encounter (Signed)
Called and left vm w/ [facility] to verify if they were accepting new patients. Awaiting return call.

## 2017-10-19 NOTE — Telephone Encounter (Signed)
Please let patient know the below and I'll refer her if she wants me to

## 2017-10-19 NOTE — Telephone Encounter (Signed)
Received call from [facility]. They are accepting new patients for injections only, and for evaluation. However they are not accepting new patients that need pain medication management.

## 2017-10-19 NOTE — Telephone Encounter (Signed)
Called and left a detailed message asking patient what she wanted to do about [facility]. Will wait for a response.

## 2017-10-19 NOTE — Telephone Encounter (Signed)
Called and left another vm for [facility]

## 2017-10-22 NOTE — Addendum Note (Signed)
Addended by: Valerie Roys on: 10/22/2017 10:24 AM   Modules accepted: Orders

## 2017-10-22 NOTE — Telephone Encounter (Signed)
Referral generated. Please tell patient that I will not be taking over her pain medicine again. So as they will not take it over at [place] advise that she continue to follow with her current pain management doctor for her medicine. Thanks.

## 2017-10-22 NOTE — Telephone Encounter (Signed)
Patient would still like the referral sent.

## 2017-11-06 ENCOUNTER — Telehealth: Payer: Self-pay | Admitting: Gastroenterology

## 2017-11-06 NOTE — Telephone Encounter (Signed)
Pt is calling to schedule EGD she states she received a vm a couple of weeks ago to call us

## 2017-11-23 ENCOUNTER — Other Ambulatory Visit: Payer: Self-pay

## 2017-11-23 ENCOUNTER — Telehealth: Payer: Self-pay

## 2017-11-23 DIAGNOSIS — R109 Unspecified abdominal pain: Secondary | ICD-10-CM

## 2017-11-23 DIAGNOSIS — R634 Abnormal weight loss: Secondary | ICD-10-CM

## 2017-11-23 NOTE — Telephone Encounter (Signed)
HIDA Scan has been scheduled for 12/14/17 at 10 am arrival time is 9:30am report to [facility].  Instructed patient do not eat or drink anything after midnight.  EGD has been scheduled for 12/07/17 at [facility].  Instructed patient do not eat or drink after midnight.  Sending patient a letter in mail to remind her of her scheduled appts.  Thanks [employer]

## 2017-11-28 ENCOUNTER — Encounter

## 2017-11-28 ENCOUNTER — Encounter: Payer: Self-pay | Admitting: Family Medicine

## 2017-11-28 ENCOUNTER — Other Ambulatory Visit: Payer: Self-pay

## 2017-11-28 ENCOUNTER — Ambulatory Visit (INDEPENDENT_AMBULATORY_CARE_PROVIDER_SITE_OTHER): Payer: Medicare Other | Admitting: Family Medicine

## 2017-11-28 VITALS — BP 115/80 | HR 96 | Temp 98.8°F | Ht 61.0 in | Wt 197.5 lb

## 2017-11-28 DIAGNOSIS — G8929 Other chronic pain: Secondary | ICD-10-CM

## 2017-11-28 DIAGNOSIS — Z23 Encounter for immunization: Secondary | ICD-10-CM | POA: Diagnosis not present

## 2017-11-28 NOTE — Progress Notes (Signed)
BP 115/80   Pulse 96   Temp 98.8 F (37.1 C) (Oral)   Ht 5\' 1"  (1.549 m)   Wt 197 lb 8 oz (89.6 kg)   SpO2 99%   BMI 37.32 kg/m    Subjective:    Patient ID: Sharon Rodriguez, female    DOB: 24-Jul-1980, 37 y.o.   MRN: [id number]  HPI: Sharon Rodriguez is a 37 y.o. female  Chief Complaint  Patient presents with  . Back Pain    pt requests pain medication refill   Patient presents today for follow up. She had been following up with pain management. She established with them and they took over her pain management on 09/24/17. She signed a controlled substance agreement with them. She was given 2 months of Rxs and was supposed to follow up with them on 11/19/17.  CHRONIC PAIN  Present dose: 30 Morphine equivalents Pain control status: uncontrolled Duration: chronic Location: widespread Quality: throbbing, tight, aching Current Pain Level: severe Previous Pain Level: moderate Breakthrough pain: yes Benefit from narcotic medications: yes What Activities task can be accomplished with current medication? Able to care for her children Interested in weaning off narcotics:no   Stool softners/OTC fiber: no  Previous pain specialty evaluation: yes Non-narcotic analgesic meds: no Narcotic contract: yes- with pain management   Relevant past medical, surgical, family and social history reviewed and updated as indicated. Interim medical history since our last visit reviewed. Allergies and medications reviewed and updated.  Review of Systems  Constitutional: Negative.   Respiratory: Negative.   Cardiovascular: Negative.   Musculoskeletal: Positive for arthralgias, back pain, gait problem and myalgias. Negative for joint swelling, neck pain and neck stiffness.  Skin: Negative.   Psychiatric/Behavioral: Negative.     Per HPI unless specifically indicated above     Objective:    BP 115/80   Pulse 96   Temp 98.8 F (37.1 C) (Oral)   Ht 5\' 1"  (1.549 m)   Wt 197 lb 8 oz (89.6  kg)   SpO2 99%   BMI 37.32 kg/m   Wt Readings from Last 3 Encounters:  11/28/17 197 lb 8 oz (89.6 kg)  09/24/17 195 lb 12.8 oz (88.8 kg)  09/24/17 195 lb (88.5 kg)    Physical Exam  Constitutional: She is oriented to person, place, and time. She appears well-developed and well-nourished. No distress.  HENT:  Head: Normocephalic and atraumatic.  Right Ear: Hearing normal.  Left Ear: Hearing normal.  Nose: Nose normal.  Eyes: Conjunctivae and lids are normal. Right eye exhibits no discharge. Left eye exhibits no discharge. No scleral icterus.  Pulmonary/Chest: Effort normal. No respiratory distress.  Musculoskeletal: Normal range of motion.  Neurological: She is alert and oriented to person, place, and time.  Skin: Skin is warm, dry and intact. Capillary refill takes less than 2 seconds. No rash noted. She is not diaphoretic. No erythema. No pallor.  Psychiatric: She has a normal mood and affect. Her speech is normal and behavior is normal. Judgment and thought content normal. Cognition and memory are normal.  Nursing note and vitals reviewed.   Results for orders placed or performed in visit on 07/19/17  Compliance Drug Analysis, Ur  Result Value Ref Range   Summary FINAL       Assessment & Plan:   Problem List Items Addressed This Visit      Other   Chronic pain - Primary    Discussed with patient again today that we will not  refill her pain medicine. As discussed for the past year, we do not do chronic pain management at this office. Patient has established with pain management. She is not happy with the care that she has been receiving there and would like to see another pain management doctor. Discussed with patient that we cannot refill her medicine and that she will need to follow up with pain management. Appointment scheduled with them for tomorrow at 1PM. Discussed with patient, that if she would like to see another pain management doctor, she will need to stay with her  current pain management doctor until she establishes with another provider. Again reiterated to patient that [facility] is not doing medication, they are only doing procedures. Discussed with patient again today that we will not give her any more pain medicine. Patient was very upset about this situation and left angry.       Other Visit Diagnoses    Flu vaccine need       Flu shot given today.   Relevant Orders   Flu Vaccine QUAD 36+ mos IM       Follow up plan: Return ASAP , for Procedure visit.

## 2017-11-28 NOTE — Assessment & Plan Note (Signed)
Discussed with patient again today that we will not refill her pain medicine. As discussed for the past year, we do not do chronic pain management at this office. Patient has established with pain management. She is not happy with the care that she has been receiving there and would like to see another pain management doctor. Discussed with patient that we cannot refill her medicine and that she will need to follow up with pain management. Appointment scheduled with them for tomorrow at 1PM. Discussed with patient, that if she would like to see another pain management doctor, she will need to stay with her current pain management doctor until she establishes with another provider. Again reiterated to patient that [facility] is not doing medication, they are only doing procedures. Discussed with patient again today that we will not give her any more pain medicine. Patient was very upset about this situation and left angry.

## 2017-11-29 ENCOUNTER — Encounter: Payer: Self-pay | Admitting: Nurse Practitioner

## 2017-11-29 ENCOUNTER — Ambulatory Visit: Payer: Medicare Other | Attending: Nurse Practitioner | Admitting: Nurse Practitioner

## 2017-11-29 ENCOUNTER — Other Ambulatory Visit: Payer: Self-pay

## 2017-11-29 VITALS — BP 116/63 | HR 105 | Temp 98.2°F | Resp 18 | Ht 61.0 in | Wt 197.0 lb

## 2017-11-29 DIAGNOSIS — G4730 Sleep apnea, unspecified: Secondary | ICD-10-CM | POA: Insufficient documentation

## 2017-11-29 DIAGNOSIS — Z886 Allergy status to analgesic agent status: Secondary | ICD-10-CM | POA: Diagnosis not present

## 2017-11-29 DIAGNOSIS — Z79890 Hormone replacement therapy (postmenopausal): Secondary | ICD-10-CM | POA: Diagnosis not present

## 2017-11-29 DIAGNOSIS — Z818 Family history of other mental and behavioral disorders: Secondary | ICD-10-CM | POA: Insufficient documentation

## 2017-11-29 DIAGNOSIS — E559 Vitamin D deficiency, unspecified: Secondary | ICD-10-CM | POA: Diagnosis not present

## 2017-11-29 DIAGNOSIS — M47816 Spondylosis without myelopathy or radiculopathy, lumbar region: Secondary | ICD-10-CM | POA: Insufficient documentation

## 2017-11-29 DIAGNOSIS — G8929 Other chronic pain: Secondary | ICD-10-CM | POA: Diagnosis not present

## 2017-11-29 DIAGNOSIS — N946 Dysmenorrhea, unspecified: Secondary | ICD-10-CM | POA: Insufficient documentation

## 2017-11-29 DIAGNOSIS — Z833 Family history of diabetes mellitus: Secondary | ICD-10-CM | POA: Insufficient documentation

## 2017-11-29 DIAGNOSIS — E039 Hypothyroidism, unspecified: Secondary | ICD-10-CM | POA: Diagnosis not present

## 2017-11-29 DIAGNOSIS — Z888 Allergy status to other drugs, medicaments and biological substances status: Secondary | ICD-10-CM | POA: Insufficient documentation

## 2017-11-29 DIAGNOSIS — G894 Chronic pain syndrome: Secondary | ICD-10-CM | POA: Diagnosis not present

## 2017-11-29 DIAGNOSIS — F419 Anxiety disorder, unspecified: Secondary | ICD-10-CM | POA: Insufficient documentation

## 2017-11-29 DIAGNOSIS — Z79899 Other long term (current) drug therapy: Secondary | ICD-10-CM | POA: Diagnosis not present

## 2017-11-29 DIAGNOSIS — M0800 Unspecified juvenile rheumatoid arthritis of unspecified site: Secondary | ICD-10-CM | POA: Insufficient documentation

## 2017-11-29 DIAGNOSIS — R5382 Chronic fatigue, unspecified: Secondary | ICD-10-CM | POA: Insufficient documentation

## 2017-11-29 DIAGNOSIS — F329 Major depressive disorder, single episode, unspecified: Secondary | ICD-10-CM | POA: Diagnosis not present

## 2017-11-29 DIAGNOSIS — M7918 Myalgia, other site: Secondary | ICD-10-CM

## 2017-11-29 DIAGNOSIS — Z8711 Personal history of peptic ulcer disease: Secondary | ICD-10-CM | POA: Insufficient documentation

## 2017-11-29 DIAGNOSIS — M545 Low back pain, unspecified: Secondary | ICD-10-CM | POA: Insufficient documentation

## 2017-11-29 DIAGNOSIS — R102 Pelvic and perineal pain: Secondary | ICD-10-CM | POA: Diagnosis not present

## 2017-11-29 DIAGNOSIS — M797 Fibromyalgia: Secondary | ICD-10-CM | POA: Diagnosis not present

## 2017-11-29 DIAGNOSIS — Z8261 Family history of arthritis: Secondary | ICD-10-CM | POA: Insufficient documentation

## 2017-11-29 DIAGNOSIS — Z8349 Family history of other endocrine, nutritional and metabolic diseases: Secondary | ICD-10-CM | POA: Insufficient documentation

## 2017-11-29 DIAGNOSIS — F819 Developmental disorder of scholastic skills, unspecified: Secondary | ICD-10-CM | POA: Insufficient documentation

## 2017-11-29 DIAGNOSIS — Z79891 Long term (current) use of opiate analgesic: Secondary | ICD-10-CM | POA: Insufficient documentation

## 2017-11-29 MED ORDER — OXYCODONE-ACETAMINOPHEN 5-325 MG PO TABS: 1.0000 | ORAL_TABLET | Freq: Four times a day (QID) | ORAL | 0 refills | Status: AC | PRN

## 2017-11-29 NOTE — Progress Notes (Signed)
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 (Serax), temazepam (Restoril), triazolam (Halcion) (Last updated: 05/10/2017) ____________________________________________________________________________________________    BMI Assessment: Estimated body mass index is 37.22 kg/m as calculated from the following:   Height as of this encounter: 5' 1"  (1.549 m).   Weight as of this encounter: 197 lb (89.4 kg).  BMI interpretation table: BMI level Category Range association with higher incidence of chronic pain  <18 kg/m2 Underweight   18.5-24.9 kg/m2 Ideal body weight   25-29.9 kg/m2 Overweight Increased incidence by 20%  30-34.9 kg/m2 Obese (Class I) Increased incidence by 68%  35-39.9 kg/m2 Severe obesity (Class II) Increased incidence by 136%  >40 kg/m2 Extreme obesity (Class III) Increased incidence by 254%   Patient's current BMI Ideal Body weight  Body mass index is 37.22 kg/m. Ideal body weight: 47.8 kg (105 lb 6.1 oz) Adjusted ideal body  weight: 64.4 kg (142 lb 0.5 oz)   BMI Readings from Last 4 Encounters:  11/29/17 37.22 kg/m  11/28/17 37.32 kg/m  09/24/17 37.00 kg/m  09/24/17 36.84 kg/m   Wt Readings from Last 4 Encounters:  11/29/17 197 lb (89.4 kg)  11/28/17 197 lb 8 oz (89.6 kg)  09/24/17 195 lb 12.8 oz (88.8 kg)  09/24/17 195 lb (88.5 kg)  ____________________________________________________________________________________________  Pain Scale  Introduction: The pain score used by this practice is the Verbal Numerical Rating Scale (VNRS-11). This is an 11-point scale. It is for adults and children 10 years or older. There are significant differences in how the pain score is reported, used, and applied. Forget everything you learned in the past and learn this scoring system.  General Information: The scale should reflect your current level of pain. Unless you are specifically asked for the level of your worst pain, or your average pain. If you are asked for one of these two, then it should be understood that it is over the past 24 hours.  Basic Activities of Daily Living (ADL): Personal hygiene, dressing, eating, transferring, and using restroom.  Instructions: Most patients tend to report their level of pain as a combination of two factors, their physical pain and their psychosocial pain. This last one is also known as "suffering" and it is reflection of how physical pain affects you socially and psychologically. From now on, report them separately. From this point on, when asked to report your pain level,  report only your physical pain. Use the following table for reference.  Pain Clinic Pain Levels (0-5/10)  Pain Level Score  Description  No Pain 0   Mild pain 1 Nagging, annoying, but does not interfere with basic activities of daily living (ADL). Patients are able to eat, bathe, get dressed, toileting (being able to get on and off the toilet and perform personal hygiene functions), transfer (move in and out  of bed or a chair without assistance), and maintain continence (able to control bladder and bowel functions). Blood pressure and heart rate are unaffected. A normal heart rate for a healthy adult ranges from 60 to 100 bpm (beats per minute).   Mild to moderate pain 2 Noticeable and distracting. Impossible to hide from other people. More frequent flare-ups. Still possible to adapt and function close to normal. It can be very annoying and may have occasional stronger flare-ups. With discipline, patients may get used to it and adapt.   Moderate pain 3 Interferes significantly with activities of daily living (ADL). It becomes difficult to feed, bathe, get dressed, get on and off the toilet or to perform personal hygiene functions. Difficult to get in and out of bed or a chair without assistance. Very distracting. With effort, it can be ignored when deeply involved in activities.   Moderately severe pain 4 Impossible to ignore for more than a few minutes. With effort, patients may still be able to manage work or participate in some social activities. Very difficult to concentrate. Signs of autonomic nervous system discharge are evident: dilated pupils (mydriasis); mild sweating (diaphoresis); sleep interference. Heart rate becomes elevated (>115 bpm). Diastolic blood pressure (lower number) rises above 100 mmHg. Patients find relief in laying down and not moving.   Severe pain 5 Intense and extremely unpleasant. Associated with frowning face and frequent crying. Pain overwhelms the senses.  Ability to do any activity or maintain social relationships becomes significantly limited. Conversation becomes difficult. Pacing back and forth is common, as getting into a comfortable position is nearly impossible. Pain wakes you up from deep sleep. Physical signs will be obvious: pupillary dilation; increased sweating; goosebumps; brisk reflexes; cold, clammy hands and feet; nausea, vomiting or dry heaves; loss of  appetite; significant sleep disturbance with inability to fall asleep or to remain asleep. When persistent, significant weight loss is observed due to the complete loss of appetite and sleep deprivation.  Blood pressure and heart rate becomes significantly elevated. Caution: If elevated blood pressure triggers a pounding headache associated with blurred vision, then the patient should immediately seek attention at an urgent or emergency care unit, as these may be signs of an impending stroke.    Emergency Department Pain Levels (6-10/10)  Emergency Room Pain 6 Severely limiting. Requires emergency care and should not be seen or managed at an outpatient pain management facility. Communication becomes difficult and requires great effort. Assistance to reach the emergency department may be required. Facial flushing and profuse sweating along with potentially dangerous increases in heart rate and blood pressure will be evident.   Distressing pain 7 Self-care is very difficult. Assistance is required to transport, or use restroom. Assistance to reach the emergency department will be required. Tasks requiring coordination, such as bathing and getting dressed become very difficult.   Disabling pain 8 Self-care is no longer possible. At this level, pain is disabling. The individual is unable to do even the most "basic" activities such as walking, eating, bathing, dressing, transferring to a bed, or toileting. Fine  motor skills are lost. It is difficult to think clearly.   Incapacitating pain 9 Pain becomes incapacitating. Thought processing is no longer possible. Difficult to remember your own name. Control of movement and coordination are lost.   The worst pain imaginable 10 At this level, most patients pass out from pain. When this level is reached, collapse of the autonomic nervous system occurs, leading to a sudden drop in blood pressure and heart rate. This in turn results in a temporary and dramatic drop  in blood flow to the brain, leading to a loss of consciousness. Fainting is one of the body's self defense mechanisms. Passing out puts the brain in a calmed state and causes it to shut down for a while, in order to begin the healing process.    Summary: 1. Refer to this scale when providing [place] with your pain level. 2. Be accurate and careful when reporting your pain level. This will help with your care. 3. Over-reporting your pain level will lead to loss of credibility. 4. Even a level of 1/10 means that there is pain and will be treated at our facility. 5. High, inaccurate reporting will be documented as "Symptom Exaggeration", leading to loss of credibility and suspicions of possible secondary gains such as obtaining more narcotics, or wanting to appear disabled, for fraudulent reasons. 6. Only pain levels of 5 or below will be seen at our facility. 7. Pain levels of 6 and above will be sent to the Emergency Department and the appointment cancelled. ____________________________________________________________________________________________

## 2017-11-29 NOTE — Patient Instructions (Addendum)
You will bring medication bottles of Rx pain clinic prescribes to each visit,  You have been encouraged to use medication page from AVS as a worksheet at home, to add to and delete medications that you are prescribed/taking/not taking and bring to each appt. to be better able to participate in the medication reconciliation portion of office visits.  Rx for Percocet to last until 12/29/17 has been escribed to your pharmacy. __________________________________________________________________________________________  Medication Rules  Applies to: All patients receiving prescriptions (written or electronic).  Pharmacy of record: Pharmacy where electronic prescriptions will be sent. If written prescriptions are taken to a different pharmacy, please inform the nursing staff. The pharmacy listed in the electronic medical record should be the one where you would like electronic prescriptions to be sent.  Prescription refills: Only during scheduled appointments. Applies to both, written and electronic prescriptions.  NOTE: The following applies primarily to controlled substances (Opioid* Pain Medications).   Patient's responsibilities: 1. Pain Pills: Bring all pain pills to every appointment (except for procedure appointments). 2. Pill Bottles: Bring pills in original pharmacy bottle. Always bring newest bottle. Bring bottle, even if empty. 3. Medication refills: You are responsible for knowing and keeping track of what medications you need refilled. The day before your appointment, write a list of all prescriptions that need to be refilled. Bring that list to your appointment and give it to the admitting nurse. Prescriptions will be written only during appointments. If you forget a medication, it will not be "Called in", "Faxed", or "electronically sent". You will need to get another appointment to get these prescribed. 4. Prescription Accuracy: You are responsible for carefully inspecting your  prescriptions before leaving our office. Have the discharge nurse carefully go over each prescription with you, before taking them home. Make sure that your name is accurately spelled, that your address is correct. Check the name and dose of your medication to make sure it is accurate. Check the number of pills, and the written instructions to make sure they are clear and accurate. Make sure that you are given enough medication to last until your next medication refill appointment. 5. Taking Medication: Take medication as prescribed. Never take more pills than instructed. Never take medication more frequently than prescribed. Taking less pills or less frequently is permitted and encouraged, when it comes to controlled substances (written prescriptions).  6. Inform other Doctors: Always inform, all of your healthcare providers, of all the medications you take. 7. Pain Medication from other Providers: You are not allowed to accept any additional pain medication from any other Doctor or Healthcare provider. There are two exceptions to this rule. (see below) In the event that you require additional pain medication, you are responsible for notifying us, as stated below. 8. Medication Agreement: You are responsible for carefully reading and following our Medication Agreement. This must be signed before receiving any prescriptions from our practice. Safely store a copy of your signed Agreement. Violations to the Agreement will result in no further prescriptions. (Additional copies of our Medication Agreement are available upon request.) 9. Laws, Rules, & Regulations: All patients are expected to follow all Federal and [employer], Rules, [employer]. Ignorance of the Laws does not constitute a valid excuse. The use of any illegal substances is prohibited. 10. Adopted CDC guidelines & recommendations: Target dosing levels will be at or below 60 MME/day. Use of benzodiazepines** is not  recommended.  Exceptions: There are only two exceptions to the rule of not receiving pain medications  from other Healthcare Providers. 1. Exception #1 (Emergencies): In the event of an emergency (i.e.: accident requiring emergency care), you are allowed to receive additional pain medication. However, you are responsible for: As soon as you are able, call our office [phone], at any time of the day or night, and leave a message stating your name, the date and nature of the emergency, and the name and dose of the medication prescribed. In the event that your call is answered by a member of our staff, make sure to document and save the date, time, and the name of the person that took your information.  2. Exception #2 (Planned Surgery): In the event that you are scheduled by another doctor or dentist to have any type of surgery or procedure, you are allowed (for a period no longer than 30 days), to receive additional pain medication, for the acute post-op pain. However, in this case, you are responsible for picking up a copy of our "Post-op Pain Management for Surgeons" handout, and giving it to your surgeon or dentist. This document is available at our office, and does not require an appointment to obtain it. Simply go to our office during business hours (Monday-Thursday from 8:00 AM to 4:00 PM) (Friday 8:00 AM to 12:00 Noon) or if you have a scheduled appointment with [place], prior to your surgery, and ask for it by name. In addition, you will need to provide [place] with your name, name of your surgeon, type of surgery, and date of procedure or surgery.  *Opioid medications include: morphine, codeine, oxycodone, oxymorphone, hydrocodone, hydromorphone, meperidine, tramadol, tapentadol, buprenorphine, fentanyl, methadone. **Benzodiazepine medications include: diazepam (Valium), alprazolam (Xanax), clonazepam (Klonopine), lorazepam (Ativan), clorazepate (Tranxene), chlordiazepoxide (Librium), estazolam (Prosom),  oxazepam (Serax), temazepam (Restoril), triazolam (Halcion) (Last updated: 05/10/2017) ____________________________________________________________________________________________    BMI Assessment: Estimated body mass index is 37.22 kg/m as calculated from the following:   Height as of this encounter: 5\' 1"  (1.549 m).   Weight as of this encounter: 197 lb (89.4 kg).  BMI interpretation table: BMI level Category Range association with higher incidence of chronic pain  <18 kg/m2 Underweight   18.5-24.9 kg/m2 Ideal body weight   25-29.9 kg/m2 Overweight Increased incidence by 20%  30-34.9 kg/m2 Obese (Class I) Increased incidence by 68%  35-39.9 kg/m2 Severe obesity (Class II) Increased incidence by 136%  >40 kg/m2 Extreme obesity (Class III) Increased incidence by 254%   Patient's current BMI Ideal Body weight  Body mass index is 37.22 kg/m. Ideal body weight: 47.8 kg (105 lb 6.1 oz) Adjusted ideal body weight: 64.4 kg (142 lb 0.5 oz)   BMI Readings from Last 4 Encounters:  11/29/17 37.22 kg/m  11/28/17 37.32 kg/m  09/24/17 37.00 kg/m  09/24/17 36.84 kg/m   Wt Readings from Last 4 Encounters:  11/29/17 197 lb (89.4 kg)  11/28/17 197 lb 8 oz (89.6 kg)  09/24/17 195 lb 12.8 oz (88.8 kg)  09/24/17 195 lb (88.5 kg)  ____________________________________________________________________________________________  Pain Scale  Introduction: The pain score used by this practice is the Verbal Numerical Rating Scale (VNRS-11). This is an 11-point scale. It is for adults and children 10 years or older. There are significant differences in how the pain score is reported, used, and applied. Forget everything you learned in the past and learn this scoring system.  General Information: The scale should reflect your current level of pain. Unless you are specifically asked for the level of your worst pain, or your average pain. If you are asked  for one of these two, then it should be  understood that it is over the past 24 hours.  Basic Activities of Daily Living (ADL): Personal hygiene, dressing, eating, transferring, and using restroom.  Instructions: Most patients tend to report their level of pain as a combination of two factors, their physical pain and their psychosocial pain. This last one is also known as "suffering" and it is reflection of how physical pain affects you socially and psychologically. From now on, report them separately. From this point on, when asked to report your pain level, report only your physical pain. Use the following table for reference.  Pain Clinic Pain Levels (0-5/10)  Pain Level Score  Description  No Pain 0   Mild pain 1 Nagging, annoying, but does not interfere with basic activities of daily living (ADL). Patients are able to eat, bathe, get dressed, toileting (being able to get on and off the toilet and perform personal hygiene functions), transfer (move in and out of bed or a chair without assistance), and maintain continence (able to control bladder and bowel functions). Blood pressure and heart rate are unaffected. A normal heart rate for a healthy adult ranges from 60 to 100 bpm (beats per minute).   Mild to moderate pain 2 Noticeable and distracting. Impossible to hide from other people. More frequent flare-ups. Still possible to adapt and function close to normal. It can be very annoying and may have occasional stronger flare-ups. With discipline, patients may get used to it and adapt.   Moderate pain 3 Interferes significantly with activities of daily living (ADL). It becomes difficult to feed, bathe, get dressed, get on and off the toilet or to perform personal hygiene functions. Difficult to get in and out of bed or a chair without assistance. Very distracting. With effort, it can be ignored when deeply involved in activities.   Moderately severe pain 4 Impossible to ignore for more than a few minutes. With effort, patients may still  be able to manage work or participate in some social activities. Very difficult to concentrate. Signs of autonomic nervous system discharge are evident: dilated pupils (mydriasis); mild sweating (diaphoresis); sleep interference. Heart rate becomes elevated (>115 bpm). Diastolic blood pressure (lower number) rises above 100 mmHg. Patients find relief in laying down and not moving.   Severe pain 5 Intense and extremely unpleasant. Associated with frowning face and frequent crying. Pain overwhelms the senses.  Ability to do any activity or maintain social relationships becomes significantly limited. Conversation becomes difficult. Pacing back and forth is common, as getting into a comfortable position is nearly impossible. Pain wakes you up from deep sleep. Physical signs will be obvious: pupillary dilation; increased sweating; goosebumps; brisk reflexes; cold, clammy hands and feet; nausea, vomiting or dry heaves; loss of appetite; significant sleep disturbance with inability to fall asleep or to remain asleep. When persistent, significant weight loss is observed due to the complete loss of appetite and sleep deprivation.  Blood pressure and heart rate becomes significantly elevated. Caution: If elevated blood pressure triggers a pounding headache associated with blurred vision, then the patient should immediately seek attention at an urgent or emergency care unit, as these may be signs of an impending stroke.    Emergency Department Pain Levels (6-10/10)  Emergency Room Pain 6 Severely limiting. Requires emergency care and should not be seen or managed at an outpatient pain management facility. Communication becomes difficult and requires great effort. Assistance to reach the emergency department may be required. Facial flushing  and profuse sweating along with potentially dangerous increases in heart rate and blood pressure will be evident.   Distressing pain 7 Self-care is very difficult. Assistance is  required to transport, or use restroom. Assistance to reach the emergency department will be required. Tasks requiring coordination, such as bathing and getting dressed become very difficult.   Disabling pain 8 Self-care is no longer possible. At this level, pain is disabling. The individual is unable to do even the most "basic" activities such as walking, eating, bathing, dressing, transferring to a bed, or toileting. Fine motor skills are lost. It is difficult to think clearly.   Incapacitating pain 9 Pain becomes incapacitating. Thought processing is no longer possible. Difficult to remember your own name. Control of movement and coordination are lost.   The worst pain imaginable 10 At this level, most patients pass out from pain. When this level is reached, collapse of the autonomic nervous system occurs, leading to a sudden drop in blood pressure and heart rate. This in turn results in a temporary and dramatic drop in blood flow to the brain, leading to a loss of consciousness. Fainting is one of the body's self defense mechanisms. Passing out puts the brain in a calmed state and causes it to shut down for a while, in order to begin the healing process.    Summary: 1. Refer to this scale when providing [place] with your pain level. 2. Be accurate and careful when reporting your pain level. This will help with your care. 3. Over-reporting your pain level will lead to loss of credibility. 4. Even a level of 1/10 means that there is pain and will be treated at our facility. 5. High, inaccurate reporting will be documented as "Symptom Exaggeration", leading to loss of credibility and suspicions of possible secondary gains such as obtaining more narcotics, or wanting to appear disabled, for fraudulent reasons. 6. Only pain levels of 5 or below will be seen at our facility. 7. Pain levels of 6 and above will be sent to the Emergency Department and the appointment  cancelled. ____________________________________________________________________________________________

## 2017-11-29 NOTE — Progress Notes (Addendum)
Nursing Pain Medication Assessment:  Safety precautions to be maintained throughout the outpatient stay will include: orient to surroundings, keep bed in low position, maintain call bell within reach at all times, provide assistance with transfer out of bed and ambulation.  Medication Inspection Compliance: Ms. Robicheaux did not comply with our request to bring her pills to be counted. She was reminded that bringing the medication bottles, even when empty, is a requirement.  Medication: None brought in. Pill/Patch Count: None available to be counted. Bottle Appearance: No container available. Did not bring bottle(s) to appointment. Filled Date: N/A Last Medication intake:  Ran out of medicine more than 48 hours ago  Pt. States she was not ware she was supposed to bring empty pill bottle; pt was informed of policy to bring pill bottles to every appt.whether or not they are empty.  Also, pt encouraged to use medication page from AVS as a worksheet at home, to add to and delete medications that she is prescribed/taking/not taking and bring to each appt. to be better able to participate in the medication reconciliation portion of office visits.  Pain medication assessment form reviewed with pt, line by line, by this RN per pt. request. Pt. Verbalized u/o each item.

## 2017-11-30 NOTE — Telephone Encounter (Signed)
See note dating 11/23/17. Done.

## 2017-12-06 ENCOUNTER — Encounter: Payer: Self-pay | Admitting: Emergency Medicine

## 2017-12-06 LAB — TOXASSURE SELECT 13 (MW), URINE

## 2017-12-07 ENCOUNTER — Encounter: Payer: Self-pay | Admitting: Anesthesiology

## 2017-12-07 ENCOUNTER — Ambulatory Visit
Admission: RE | Admit: 2017-12-07 | Discharge: 2017-12-07 | Disposition: A | Discharge status: Home / Self Care | Payer: Medicare Other | Source: Ambulatory Visit | Attending: Gastroenterology | Admitting: Gastroenterology

## 2017-12-07 ENCOUNTER — Encounter
Admission: RE | Disposition: A | Discharge status: Home / Self Care | Payer: Self-pay | Source: Ambulatory Visit | Attending: Gastroenterology

## 2017-12-07 ENCOUNTER — Ambulatory Visit: Payer: Medicare Other | Admitting: Anesthesiology

## 2017-12-07 DIAGNOSIS — M069 Rheumatoid arthritis, unspecified: Secondary | ICD-10-CM | POA: Diagnosis not present

## 2017-12-07 DIAGNOSIS — Z8711 Personal history of peptic ulcer disease: Secondary | ICD-10-CM | POA: Insufficient documentation

## 2017-12-07 DIAGNOSIS — M26609 Unspecified temporomandibular joint disorder, unspecified side: Secondary | ICD-10-CM | POA: Diagnosis not present

## 2017-12-07 DIAGNOSIS — Z825 Family history of asthma and other chronic lower respiratory diseases: Secondary | ICD-10-CM | POA: Diagnosis not present

## 2017-12-07 DIAGNOSIS — G8929 Other chronic pain: Secondary | ICD-10-CM | POA: Diagnosis not present

## 2017-12-07 DIAGNOSIS — F819 Developmental disorder of scholastic skills, unspecified: Secondary | ICD-10-CM | POA: Insufficient documentation

## 2017-12-07 DIAGNOSIS — Z833 Family history of diabetes mellitus: Secondary | ICD-10-CM | POA: Diagnosis not present

## 2017-12-07 DIAGNOSIS — F988 Other specified behavioral and emotional disorders with onset usually occurring in childhood and adolescence: Secondary | ICD-10-CM | POA: Insufficient documentation

## 2017-12-07 DIAGNOSIS — Z801 Family history of malignant neoplasm of trachea, bronchus and lung: Secondary | ICD-10-CM | POA: Diagnosis not present

## 2017-12-07 DIAGNOSIS — F418 Other specified anxiety disorders: Secondary | ICD-10-CM | POA: Diagnosis not present

## 2017-12-07 DIAGNOSIS — Z810 Family history of intellectual disabilities: Secondary | ICD-10-CM | POA: Insufficient documentation

## 2017-12-07 DIAGNOSIS — M797 Fibromyalgia: Secondary | ICD-10-CM | POA: Diagnosis not present

## 2017-12-07 DIAGNOSIS — G4730 Sleep apnea, unspecified: Secondary | ICD-10-CM | POA: Diagnosis not present

## 2017-12-07 DIAGNOSIS — E669 Obesity, unspecified: Secondary | ICD-10-CM | POA: Diagnosis not present

## 2017-12-07 DIAGNOSIS — Z888 Allergy status to other drugs, medicaments and biological substances status: Secondary | ICD-10-CM | POA: Diagnosis not present

## 2017-12-07 DIAGNOSIS — F419 Anxiety disorder, unspecified: Secondary | ICD-10-CM | POA: Insufficient documentation

## 2017-12-07 DIAGNOSIS — F329 Major depressive disorder, single episode, unspecified: Secondary | ICD-10-CM | POA: Diagnosis not present

## 2017-12-07 DIAGNOSIS — Z8261 Family history of arthritis: Secondary | ICD-10-CM | POA: Diagnosis not present

## 2017-12-07 DIAGNOSIS — R1013 Epigastric pain: Secondary | ICD-10-CM | POA: Insufficient documentation

## 2017-12-07 DIAGNOSIS — E039 Hypothyroidism, unspecified: Secondary | ICD-10-CM | POA: Diagnosis not present

## 2017-12-07 DIAGNOSIS — Z8249 Family history of ischemic heart disease and other diseases of the circulatory system: Secondary | ICD-10-CM | POA: Diagnosis not present

## 2017-12-07 DIAGNOSIS — R109 Unspecified abdominal pain: Secondary | ICD-10-CM

## 2017-12-07 DIAGNOSIS — R5382 Chronic fatigue, unspecified: Secondary | ICD-10-CM | POA: Diagnosis not present

## 2017-12-07 DIAGNOSIS — Z79899 Other long term (current) drug therapy: Secondary | ICD-10-CM | POA: Diagnosis not present

## 2017-12-07 DIAGNOSIS — Z818 Family history of other mental and behavioral disorders: Secondary | ICD-10-CM | POA: Diagnosis not present

## 2017-12-07 DIAGNOSIS — Z808 Family history of malignant neoplasm of other organs or systems: Secondary | ICD-10-CM | POA: Insufficient documentation

## 2017-12-07 HISTORY — PX: ESOPHAGOGASTRODUODENOSCOPY (EGD) WITH PROPOFOL: SHX5813

## 2017-12-07 LAB — POCT PREGNANCY, URINE: PREG TEST UR: NEGATIVE

## 2017-12-07 SURGERY — ESOPHAGOGASTRODUODENOSCOPY (EGD) WITH PROPOFOL
Anesthesia: General

## 2017-12-07 MED ORDER — GLYCOPYRROLATE 0.2 MG/ML IJ SOLN
INTRAMUSCULAR | Status: AC
  Filled 2017-12-07: qty 1

## 2017-12-07 MED ORDER — FENTANYL CITRATE (PF) 100 MCG/2ML IJ SOLN
INTRAMUSCULAR | Status: AC
  Filled 2017-12-07: qty 2

## 2017-12-07 MED ORDER — GLYCOPYRROLATE 0.2 MG/ML IJ SOLN
INTRAMUSCULAR | Status: DC | PRN
  Administered 2017-12-07: 0.2 mg via INTRAVENOUS

## 2017-12-07 MED ORDER — SODIUM CHLORIDE 0.9 % IV SOLN
INTRAVENOUS | Status: DC
  Administered 2017-12-07: 10:00:00 via INTRAVENOUS

## 2017-12-07 MED ORDER — MIDAZOLAM HCL 5 MG/5ML IJ SOLN
INTRAMUSCULAR | Status: DC | PRN
  Administered 2017-12-07: 2 mg via INTRAVENOUS

## 2017-12-07 MED ORDER — FENTANYL CITRATE (PF) 100 MCG/2ML IJ SOLN
INTRAMUSCULAR | Status: DC | PRN
  Administered 2017-12-07 (×2): 50 ug via INTRAVENOUS

## 2017-12-07 MED ORDER — LIDOCAINE HCL (PF) 2 % IJ SOLN
INTRAMUSCULAR | Status: DC | PRN
  Administered 2017-12-07: 100 mg

## 2017-12-07 MED ORDER — PROPOFOL 500 MG/50ML IV EMUL
INTRAVENOUS | Status: DC | PRN
  Administered 2017-12-07: 50 ug/kg/min via INTRAVENOUS

## 2017-12-07 MED ORDER — PROPOFOL 10 MG/ML IV BOLUS
INTRAVENOUS | Status: DC | PRN
  Administered 2017-12-07: 30 mg via INTRAVENOUS

## 2017-12-07 MED ORDER — MIDAZOLAM HCL 2 MG/2ML IJ SOLN
INTRAMUSCULAR | Status: AC
  Filled 2017-12-07: qty 2

## 2017-12-07 MED ORDER — LIDOCAINE HCL (PF) 2 % IJ SOLN
INTRAMUSCULAR | Status: AC
  Filled 2017-12-07: qty 10

## 2017-12-07 NOTE — Transfer of Care (Signed)
Immediate Anesthesia Transfer of Care Note  Patient: Sharon Rodriguez  Procedure(s) Performed: ESOPHAGOGASTRODUODENOSCOPY (EGD) WITH PROPOFOL (N/A )  Patient Location: PACU  Anesthesia Type:General  Level of Consciousness: sedated  Airway & Oxygen Therapy: Patient Spontanous Breathing  Post-op Assessment: Report given to RN and Post -op Vital signs reviewed and stable  Post vital signs: Reviewed and stable  Last Vitals:  Vitals Value Taken Time  BP    Temp 36.2 C 12/07/2017 11:40 AM  Pulse 89 12/07/2017 11:45 AM  Resp 13 12/07/2017 11:45 AM  SpO2 97 % 12/07/2017 11:45 AM  Vitals shown include unvalidated device data.  Last Pain:  Vitals:   12/07/17 0953  TempSrc: Tympanic         Complications: No apparent anesthesia complications

## 2017-12-07 NOTE — H&P (Signed)
Jonathon Bellows, MD [address], [place], [place], [place], [address] [address], [place], [place], [place], [address] Phone: [phone]  Fax: [phone]  Primary Care Physician:  Valerie Roys, DO   Pre-Procedure History & Physical: HPI:  Sharon Rodriguez is a 37 y.o. female is here for an endoscopy    Past Medical History:  Diagnosis Date  . ADD (attention deficit disorder)   . Anxiety   . Chronic bronchitis ([place])   . Chronic fatigue   . Chronic pain   . Depression   . Fibromyalgia   . History of stomach ulcers 2019  . Hypothyroidism 01/27/2015  . Learning disability   . Osteoarthritis   . Rheumatoid arthritis ([place])   . Sleep apnea   . Thyroid disease   . TMJ (dislocation of temporomandibular joint)     Past Surgical History:  Procedure Laterality Date  . CESAREAN SECTION     x 2    Prior to Admission medications   Medication Sig Start Date End Date Taking? Authorizing Provider  albuterol (PROVENTIL) (2.5 MG/3ML) 0.083% nebulizer solution Frequency:Q4HPRN   Dosage:0.0     Instructions:  Note:Dose: 1  WP:Y09 07/22/15  Yes Johnson, Megan P, DO  levothyroxine (SYNTHROID, LEVOTHROID) 50 MCG tablet Take 1 tablet (50 mcg total) by mouth daily before breakfast. NAME BRAND ONLY PLEASE 09/27/17  Yes Johnson, Megan P, DO  oxyCODONE-acetaminophen (PERCOCET) 5-325 MG tablet Take 1 tablet by mouth every 6 (six) hours as needed for severe pain. 11/29/17 12/29/17 Yes Vevelyn Francois, NP  Vitamin D, Ergocalciferol, (DRISDOL) 50000 units CAPS capsule Take 1 capsule (50,000 Units total) by mouth every 7 (seven) days. 10/04/16  Yes Johnson, Megan P, DO  dicyclomine (BENTYL) 10 MG capsule Take 1 capsule (10 mg total) by mouth 4 (four) times daily -  before meals and at bedtime for 14 days. 04/18/17 11/29/17  Jonathon Bellows, MD  omeprazole (PRILOSEC) 40 MG capsule Take 1 capsule (40 mg total) by mouth daily. 09/24/17 11/29/17  Jonathon Bellows, MD    Allergies as of 11/23/2017 - Review  Complete 09/24/2017  Allergen Reaction Noted  . Ibuprofen Diarrhea and Nausea Only 01/27/2015  . Promethazine Rash 01/27/2015    Family History  Problem Relation Age of Onset  . Diabetes Mother   . ADD / ADHD Mother   . Learning disabilities Mother   . COPD Mother   . Diabetes Father   . Hyperlipidemia Father   . Mental illness Father        Depression/anxiety  . Mental illness Sister        Depression/Anxiety  . Arthritis Brother        Rheumatoid  . Mental illness Daughter        anxiety  . Thyroid disease Daughter   . ADD / ADHD Daughter   . Allergies Daughter   . Cancer Maternal Grandmother        Skin, Lung  . Heart disease Maternal Grandfather   . Stroke Maternal Grandfather   . Heart attack Maternal Grandfather   . Arthritis Paternal Grandmother        Rheumatoid  . Dementia Paternal Grandmother   . Allergies Daughter     Social History   Socioeconomic History  . Marital status: Married    Spouse name: Not on file  . Number of children: Not on file  . Years of education: Not on file  . Highest education level: Not on file  Occupational History  . Not on file  Social Needs  . Financial resource strain: Hard  . Food insecurity:    Worry: Sometimes true    Inability: Never true  . Transportation needs:    Medical: Yes    Non-medical: Yes  Tobacco Use  . Smoking status: Never Smoker  . Smokeless tobacco: Never Used  Substance and Sexual Activity  . Alcohol use: No  . Drug use: No  . Sexual activity: Not Currently    Birth control/protection: None  Lifestyle  . Physical activity:    Days per week: 0 days    Minutes per session: 0 min  . Stress: Not at all  Relationships  . Social connections:    Talks on phone: Never    Gets together: Once a week    Attends religious service: More than 4 times per year    Active member of club or organization: Yes    Attends meetings of clubs or organizations: More than 4 times per year    Relationship  status: Married  . Intimate partner violence:    Fear of current or ex partner: No    Emotionally abused: No    Physically abused: No    Forced sexual activity: No  Other Topics Concern  . Not on file  Social History Narrative  . Not on file    Review of Systems: See HPI, otherwise negative ROS  Physical Exam: BP 114/61   Pulse 83   Temp (!) 97.2 F (36.2 C) (Tympanic)   Resp 16   LMP 11/12/2017 (Approximate) Comment: Negative urine pregnancy 12-07-2017  SpO2 98%  General:   Alert,  pleasant and cooperative in NAD Head:  Normocephalic and atraumatic. Neck:  Supple; no masses or thyromegaly. Lungs:  Clear throughout to auscultation, normal respiratory effort.    Heart:  +S1, +S2, Regular rate and rhythm, No edema. Abdomen:  Soft, nontender and nondistended. Normal bowel sounds, without guarding, and without rebound.   Neurologic:  Alert and  oriented x4;  grossly normal neurologically.  Impression/Plan: Sharon Rodriguez is here for an endoscopy  to be performed for  evaluation of abdominal pain    Risks, benefits, limitations, and alternatives regarding endoscopy have been reviewed with the patient.  Questions have been answered.  All parties agreeable.   Jonathon Bellows, MD  12/07/2017, 11:23 AM

## 2017-12-07 NOTE — Op Note (Signed)
[facility] Patient Name: Sharon Rodriguez Procedure Date: 12/07/2017 11:29 AM MRN: [id number] Account #: [id number] Date of Birth: 1981-01-02 Admit Type: Outpatient Age: 37 Room: [facility] ENDO ROOM 4 Gender: Female Note Status: Finalized Procedure:            Upper GI endoscopy Indications:          Dyspepsia Providers:            Jonathon Bellows MD, MD Referring MD:         Valerie Roys (Referring MD) Medicines:            Monitored Anesthesia Care Complications:        No immediate complications. Procedure:            Pre-Anesthesia Assessment:                       - Prior to the procedure, a History and Physical was                        performed, and patient medications, allergies and                        sensitivities were reviewed. The patient's tolerance of                        previous anesthesia was reviewed.                       - The risks and benefits of the procedure and the                        sedation options and risks were discussed with the                        patient. All questions were answered and informed                        consent was obtained.                       - ASA Grade Assessment: II - A patient with mild                        systemic disease.                       After obtaining informed consent, the endoscope was                        passed under direct vision. Throughout the procedure,                        the patient's blood pressure, pulse, and oxygen                        saturations were monitored continuously. The Endoscope                        was introduced through the mouth, and advanced to the  third part of duodenum. The upper GI endoscopy was                        accomplished with ease. The patient tolerated the                        procedure well. Findings:      The examined duodenum was normal.      The entire examined stomach was normal. Biopsies  were taken with a cold       forceps for histology.      The examined esophagus was normal. Biopsies were obtained from the       proximal and distal esophagus with cold forceps for histology of       suspected eosinophilic esophagitis. Impression:           - Normal examined duodenum.                       - Normal stomach. Biopsied.                       - Normal esophagus. Biopsied. Recommendation:       - Discharge patient to home (with escort).                       - Resume previous diet.                       - Use Prilosec (omeprazole) 40 mg PO daily. Procedure Code(s):    --- Professional ---                       [id number], Esophagogastroduodenoscopy, flexible, transoral;                        with biopsy, single or multiple Diagnosis Code(s):    --- Professional ---                       R10.13, Epigastric pain CPT copyright 2017 American Medical Association. All rights reserved. The codes documented in this report are preliminary and upon coder review may  be revised to meet current compliance requirements. Jonathon Bellows, MD Jonathon Bellows MD, MD 12/07/2017 11:41:00 AM This report has been signed electronically. Number of Addenda: 0 Note Initiated On: 12/07/2017 11:29 AM      [facility]

## 2017-12-07 NOTE — Anesthesia Post-op Follow-up Note (Signed)
Anesthesia QCDR form completed.        

## 2017-12-07 NOTE — Anesthesia Postprocedure Evaluation (Signed)
Anesthesia Post Note  Patient: Sharon Rodriguez  Procedure(s) Performed: ESOPHAGOGASTRODUODENOSCOPY (EGD) WITH PROPOFOL (N/A )  Patient location during evaluation: Endoscopy Anesthesia Type: General Level of consciousness: awake and alert Pain management: pain level controlled Vital Signs Assessment: post-procedure vital signs reviewed and stable Respiratory status: spontaneous breathing, nonlabored ventilation, respiratory function stable and patient connected to nasal cannula oxygen Cardiovascular status: blood pressure returned to baseline and stable Postop Assessment: no apparent nausea or vomiting Anesthetic complications: no     Last Vitals:  Vitals:   12/07/17 1200 12/07/17 1209  BP: (!) 108/54 110/71  Pulse: 82 79  Resp: 15 14  Temp:    SpO2: 99% 100%    Last Pain:  Vitals:   12/07/17 0953  TempSrc: Tympanic                 Ethelle Ola S

## 2017-12-07 NOTE — Anesthesia Preprocedure Evaluation (Addendum)
Anesthesia Evaluation  Patient identified by MRN, date of birth, ID band Patient awake    Reviewed: Allergy & Precautions, NPO status , Patient's Chart, lab work & pertinent test results, reviewed documented beta blocker date and time   Airway Mallampati: III  TM Distance: >3 FB     Dental  (+) Partial Lower   Pulmonary sleep apnea ,           Cardiovascular      Neuro/Psych PSYCHIATRIC DISORDERS Anxiety Depression  Neuromuscular disease    GI/Hepatic   Endo/Other  Hypothyroidism   Renal/GU      Musculoskeletal  (+) Arthritis , Rheumatoid disorders,  Fibromyalgia -  Abdominal   Peds  Hematology   Anesthesia Other Findings Obese.ADD.  Reproductive/Obstetrics                            Anesthesia Physical Anesthesia Plan  ASA: III  Anesthesia Plan: General   Post-op Pain Management:    Induction: Intravenous  PONV Risk Score and Plan:   Airway Management Planned:   Additional Equipment:   Intra-op Plan:   Post-operative Plan:   Informed Consent: I have reviewed the patients History and Physical, chart, labs and discussed the procedure including the risks, benefits and alternatives for the proposed anesthesia with the patient or authorized representative who has indicated his/her understanding and acceptance.     Plan Discussed with: CRNA  Anesthesia Plan Comments:         Anesthesia Quick Evaluation

## 2017-12-10 ENCOUNTER — Encounter: Payer: Self-pay | Admitting: Gastroenterology

## 2017-12-11 LAB — SURGICAL PATHOLOGY

## 2017-12-14 ENCOUNTER — Ambulatory Visit
Admission: RE | Admit: 2017-12-14 | Discharge: 2017-12-14 | Disposition: A | Discharge status: Home / Self Care | Payer: Medicare Other | Source: Ambulatory Visit | Attending: Gastroenterology | Admitting: Gastroenterology

## 2017-12-14 DIAGNOSIS — R1010 Upper abdominal pain, unspecified: Secondary | ICD-10-CM | POA: Diagnosis not present

## 2017-12-14 DIAGNOSIS — R634 Abnormal weight loss: Secondary | ICD-10-CM | POA: Insufficient documentation

## 2017-12-14 DIAGNOSIS — R109 Unspecified abdominal pain: Secondary | ICD-10-CM | POA: Diagnosis not present

## 2017-12-14 MED ORDER — TECHNETIUM TC 99M MEBROFENIN IV KIT
5.2650 | PACK | Freq: Once | INTRAVENOUS | Status: AC | PRN
  Administered 2017-12-14: 5.265 via INTRAVENOUS

## 2017-12-16 ENCOUNTER — Encounter: Payer: Self-pay | Admitting: Gastroenterology

## 2017-12-17 ENCOUNTER — Telehealth: Payer: Self-pay | Admitting: Gastroenterology

## 2017-12-17 ENCOUNTER — Other Ambulatory Visit: Payer: Self-pay | Admitting: Family Medicine

## 2017-12-17 DIAGNOSIS — K828 Other specified diseases of gallbladder: Secondary | ICD-10-CM

## 2017-12-17 NOTE — Telephone Encounter (Signed)
Patient call & l/m on the answering machine asking for her results from 12-07-17.

## 2017-12-18 ENCOUNTER — Telehealth: Payer: Self-pay | Admitting: Gastroenterology

## 2017-12-18 NOTE — Telephone Encounter (Signed)
Returned pt call regarding request for EGD/biopsy results. LVM to return call

## 2017-12-18 NOTE — Telephone Encounter (Signed)
Returning call for results  °

## 2017-12-18 NOTE — Telephone Encounter (Signed)
Spoke with pt regarding HIDA scan results. I have informed pt of Dr. Georgeann Oppenheim instructions to refer pt to a surgeon for an evaluation to determine if a cholecystectomy is needed. Pt PCP has sent the referral to [facility]. Pt is aware.

## 2017-12-23 DIAGNOSIS — E039 Hypothyroidism, unspecified: Secondary | ICD-10-CM | POA: Diagnosis not present

## 2017-12-23 DIAGNOSIS — M797 Fibromyalgia: Secondary | ICD-10-CM | POA: Diagnosis not present

## 2017-12-23 DIAGNOSIS — R252 Cramp and spasm: Secondary | ICD-10-CM | POA: Diagnosis not present

## 2017-12-25 ENCOUNTER — Ambulatory Visit: Payer: Medicare Other | Admitting: Gastroenterology

## 2017-12-25 ENCOUNTER — Encounter: Payer: Medicare Other | Admitting: Nurse Practitioner

## 2017-12-27 ENCOUNTER — Ambulatory Visit (INDEPENDENT_AMBULATORY_CARE_PROVIDER_SITE_OTHER): Payer: Medicare Other | Admitting: Surgery

## 2017-12-27 ENCOUNTER — Encounter: Payer: Self-pay | Admitting: Surgery

## 2017-12-27 ENCOUNTER — Other Ambulatory Visit: Payer: Self-pay

## 2017-12-27 VITALS — BP 125/77 | HR 90 | Temp 97.5°F | Resp 13 | Ht 61.0 in | Wt 198.0 lb

## 2017-12-27 DIAGNOSIS — K219 Gastro-esophageal reflux disease without esophagitis: Secondary | ICD-10-CM | POA: Insufficient documentation

## 2017-12-27 DIAGNOSIS — K828 Other specified diseases of gallbladder: Secondary | ICD-10-CM

## 2017-12-27 HISTORY — DX: Other specified diseases of gallbladder: K82.8

## 2017-12-27 NOTE — H&P (View-Only) (Signed)
Surgical Clinic History and Physical  Referring provider:  Valerie Roys, DO [place], [place] [address]  HISTORY OF PRESENT ILLNESS (HPI):  37 y.o. female presents for evaluation of abdominal pain. Patient reports she first began to experience progressively worsening and increasingly frequent post-prandial RUQ abdominal pain (worse with foods such as hot dogs and meat sauce) towards the end of last year, after Thanksgiving. She at that time presented to [facility] ED in January of 2019 for same and underwent RUQ abdominal ultrasound. Since then, she underwent EGD and was prescribed omeprazole for GERD, which helped relieve her GERD (worse with spicy foods), but her post-prandial RUQ abdominal pain has not improved. She accordingly underwent HIDA and is referred for cholecystectomy. Patient otherwise describes +flatus and +BM's, denies fever/chills, CP, or SOB.  PAST MEDICAL HISTORY (PMH):  Past Medical History:  Diagnosis Date  . ADD (attention deficit disorder)   . Anxiety   . Chronic bronchitis ([place])   . Chronic fatigue   . Chronic pain   . Depression   . Fibromyalgia   . History of stomach ulcers 2019  . Hypothyroidism 01/27/2015  . Learning disability   . Osteoarthritis   . Rheumatoid arthritis ([place])   . Sleep apnea   . Thyroid disease   . TMJ (dislocation of temporomandibular joint)      PAST SURGICAL HISTORY ([place]):  Past Surgical History:  Procedure Laterality Date  . CESAREAN SECTION     x 2  . ESOPHAGOGASTRODUODENOSCOPY (EGD) WITH PROPOFOL N/A 12/07/2017   Procedure: ESOPHAGOGASTRODUODENOSCOPY (EGD) WITH PROPOFOL;  Surgeon: Jonathon Bellows, MD;  Location: [facility] ENDOSCOPY;  Service: Gastroenterology;  Laterality: N/A;     MEDICATIONS:  Prior to Admission medications   Medication Sig Start Date End Date Taking? Authorizing Provider  albuterol (PROVENTIL) (2.5 MG/3ML) 0.083% nebulizer solution Frequency:Q4HPRN   Dosage:0.0     Instructions:  Note:Dose: 1  YB:O17 07/22/15   Yes Johnson, Megan P, DO  amphetamine-dextroamphetamine (ADDERALL) 20 MG tablet  12/18/17  Yes [provider]  DULoxetine (CYMBALTA) 30 MG capsule  12/18/17  Yes [provider]  levothyroxine (SYNTHROID, LEVOTHROID) 50 MCG tablet Take 1 tablet (50 mcg total) by mouth daily before breakfast. NAME BRAND ONLY PLEASE 09/27/17  Yes Johnson, Megan P, DO  oxyCODONE-acetaminophen (PERCOCET) 5-325 MG tablet Take 1 tablet by mouth every 6 (six) hours as needed for severe pain. 11/29/17 12/29/17 Yes Vevelyn Francois, NP  Vitamin D, Ergocalciferol, (DRISDOL) 50000 units CAPS capsule Take 1 capsule (50,000 Units total) by mouth every 7 (seven) days. 10/04/16  Yes Johnson, Megan P, DO  dicyclomine (BENTYL) 10 MG capsule Take 1 capsule (10 mg total) by mouth 4 (four) times daily -  before meals and at bedtime for 14 days. 04/18/17 11/29/17  Jonathon Bellows, MD  omeprazole (PRILOSEC) 40 MG capsule Take 1 capsule (40 mg total) by mouth daily. 09/24/17 11/29/17  Jonathon Bellows, MD     ALLERGIES:  Allergies  Allergen Reactions  . Ibuprofen Diarrhea and Nausea Only    Other Reaction: GI Upset  . Promethazine Rash     SOCIAL HISTORY:  Social History   Socioeconomic History  . Marital status: Married    Spouse name: Not on file  . Number of children: Not on file  . Years of education: Not on file  . Highest education level: Not on file  Occupational History  . Not on file  Social Needs  . Financial resource strain: Hard  . Food insecurity:  Worry: Sometimes true    Inability: Never true  . Transportation needs:    Medical: Yes    Non-medical: Yes  Tobacco Use  . Smoking status: Never Smoker  . Smokeless tobacco: Never Used  Substance and Sexual Activity  . Alcohol use: No  . Drug use: No  . Sexual activity: Not Currently    Birth control/protection: None  Lifestyle  . Physical activity:    Days per week: 0 days    Minutes per session: 0 min  . Stress: Not at all  Relationships  .  Social connections:    Talks on phone: Never    Gets together: Once a week    Attends religious service: More than 4 times per year    Active member of club or organization: Yes    Attends meetings of clubs or organizations: More than 4 times per year    Relationship status: Married  . Intimate partner violence:    Fear of current or ex partner: No    Emotionally abused: No    Physically abused: No    Forced sexual activity: No  Other Topics Concern  . Not on file  Social History Narrative  . Not on file    The patient currently resides (home / rehab facility / nursing home): Home The patient normally is (ambulatory / bedbound): Ambulatory  FAMILY HISTORY:  Family History  Problem Relation Age of Onset  . Diabetes Mother   . ADD / ADHD Mother   . Learning disabilities Mother   . COPD Mother   . Diabetes Father   . Hyperlipidemia Father   . Mental illness Father        Depression/anxiety  . Mental illness Sister        Depression/Anxiety  . Arthritis Brother        Rheumatoid  . Mental illness Daughter        anxiety  . Thyroid disease Daughter   . ADD / ADHD Daughter   . Allergies Daughter   . Cancer Maternal Grandmother        Skin, Lung  . Heart disease Maternal Grandfather   . Stroke Maternal Grandfather   . Heart attack Maternal Grandfather   . Arthritis Paternal Grandmother        Rheumatoid  . Dementia Paternal Grandmother   . Allergies Daughter     Otherwise negative/non-contributory.  REVIEW OF SYSTEMS:  Constitutional: denies any other weight loss, fever, chills, or sweats  Eyes: denies any other vision changes, history of eye injury  ENT: denies sore throat, hearing problems  Respiratory: denies shortness of breath, wheezing  Cardiovascular: denies chest pain, palpitations  Gastrointestinal: abdominal pain, N/V, and bowel function as per HPI Musculoskeletal: denies any other joint pains or cramps  Skin: Denies any other rashes or skin  discolorations Neurological: denies any other headache, dizziness, weakness  Psychiatric: Denies any other depression, anxiety   All other review of systems were otherwise negative   VITAL SIGNS:  BP 125/77   Pulse 90   Temp (!) 97.5 F (36.4 C) (Temporal)   Resp 13   Ht 5\' 1"  (1.549 m)   Wt 198 lb (89.8 kg)   LMP 12/27/2017   SpO2 96%   BMI 37.41 kg/m   PHYSICAL EXAM:  Constitutional:  -- Obese body habitus  -- Awake, alert, and oriented x3  Eyes:  -- Pupils equally round and reactive to light  -- No scleral icterus  Ear, nose, throat:  --  No jugular venous distension -- No nasal drainage, bleeding Pulmonary:  -- No crackles  -- Equal breath sounds bilaterally -- Breathing non-labored at rest Cardiovascular:  -- S1, S2 present  -- No pericardial rubs  Gastrointestinal:  -- Abdomen soft and non-distended with mild-/moderate- RUQ >> diffuse minimal non-specific abdominal tenderness to palpation, no guarding/rebound tenderness  -- No abdominal masses appreciated, pulsatile or otherwise  Musculoskeletal and Integumentary:  -- Wounds or skin discoloration: None appreciated -- Extremities: B/L UE and LE FROM, hands and feet warm, no edema  Neurologic:  -- Motor function: Intact and symmetric -- Sensation: Intact and symmetric  Labs:  CBC Latest Ref Rng & Units 01/30/2017 03/20/2013  WBC 3.4 - 10.8 x10E3/uL 10.4 8.2  Hemoglobin 11.1 - 15.9 g/dL 12.4 13.7  Hematocrit 34.0 - 46.6 % 37.7 39.9  Platelets 150 - 379 x10E3/uL 272 237   CMP Latest Ref Rng & Units 04/10/2017 03/20/2013  Glucose 65 - 99 mg/dL - 102(H)  BUN 7 - 18 mg/dL - 14  Creatinine 0.60 - 1.30 mg/dL - 0.86  Sodium 136 - 145 mmol/L - 137  Potassium 3.5 - 5.1 mmol/L - 3.6  Chloride 98 - 107 mmol/L - 103  CO2 21 - 32 mmol/L - 27  Calcium 8.5 - 10.1 mg/dL - 8.9  Total Protein 6.5 - 8.1 g/dL 8.2(H) -  Total Bilirubin 0.3 - 1.2 mg/dL 0.3 -  Alkaline Phos 38 - 126 U/L 63 -  AST 15 - 41 U/L 48(H) -  ALT 14 -  54 U/L 28 -   Imaging studies:  Limited RUQ Abdominal Ultrasound (03/22/2017) No gallstones or gallbladder wall thickening visualized. Common bile duct diameter measures 3 mm.  HIDA Nuclear Medicine Hepatobiliary Imaging (12/14/2017) Liver uptake of radiotracer is unremarkable. There is prompt visualization of gallbladder and small bowel, indicating patency of the cystic and common bile ducts. The patient consumed 8 ounces of Ensure orally with calculation of the computer generated ejection fraction of radiotracer from the gallbladder. The patient did experience abdominal pain with the oral Ensure consumption. The computer generated ejection fraction of radiotracer from the gallbladder is toward the lower limits of normal at 38%, normal greater than 33% using the oral agent.  Assessment/Plan: 37 y.o. female with biliary dyskinesia, complicated by pertinent comorbidities including morbid obesity (BMI >37), OSA, GERD with history of PUD, hypothyroidism, rheumatoid arthritis, fibromyalgia, generalized anxiety disorder, and major depression disorder.    - continue omeprazole once daily as prescribed (routine, not prn)             - avoid/minimize foods with higher fat content (meats, cheeses/dairy, and fried)             - prefer low-fat vegetables, whole grains (wheat bread, ceareals, etc), and fruits until cholecystectomy              - all risks, benefits, and alternatives to cholecystectomy, including possibility that cholecystectomy in this context may be as much diagnostic as potentially therapeutic, were discussed with the patient, all of her questions were answered to patient's expressed satisfaction, patient expresses she wishes to proceed, and informed consent was obtained.             - will plan for laparoscopic cholecystectomy next week on 10/25 per patient request pending anesthesia and OR availability             - anticipate return to clinic 2 weeks after above planned surgery              -  instructed to call if any questions or concerns  All of the above recommendations were discussed with the patient, and all of patient's questions were answered to her expressed satisfaction.  Thank you for the opportunity to participate in this patient's care.  -- Marilynne Drivers Rosana Hoes, MD, [place]: [facility] - Partnering for exceptional care. Office: [phone]

## 2017-12-27 NOTE — Progress Notes (Signed)
Surgical Clinic History and Physical  Referring provider:  Valerie Roys, DO [place], [place] [address]  HISTORY OF PRESENT ILLNESS (HPI):  37 y.o. female presents for evaluation of abdominal pain. Patient reports she first began to experience progressively worsening and increasingly frequent post-prandial RUQ abdominal pain (worse with foods such as hot dogs and meat sauce) towards the end of last year, after Thanksgiving. She at that time presented to [facility] ED in January of 2019 for same and underwent RUQ abdominal ultrasound. Since then, she underwent EGD and was prescribed omeprazole for GERD, which helped relieve her GERD (worse with spicy foods), but her post-prandial RUQ abdominal pain has not improved. She accordingly underwent HIDA and is referred for cholecystectomy. Patient otherwise describes +flatus and +BM's, denies fever/chills, CP, or SOB.  PAST MEDICAL HISTORY (PMH):  Past Medical History:  Diagnosis Date  . ADD (attention deficit disorder)   . Anxiety   . Chronic bronchitis ([place])   . Chronic fatigue   . Chronic pain   . Depression   . Fibromyalgia   . History of stomach ulcers 2019  . Hypothyroidism 01/27/2015  . Learning disability   . Osteoarthritis   . Rheumatoid arthritis ([place])   . Sleep apnea   . Thyroid disease   . TMJ (dislocation of temporomandibular joint)      PAST SURGICAL HISTORY ([place]):  Past Surgical History:  Procedure Laterality Date  . CESAREAN SECTION     x 2  . ESOPHAGOGASTRODUODENOSCOPY (EGD) WITH PROPOFOL N/A 12/07/2017   Procedure: ESOPHAGOGASTRODUODENOSCOPY (EGD) WITH PROPOFOL;  Surgeon: Jonathon Bellows, MD;  Location: [facility] ENDOSCOPY;  Service: Gastroenterology;  Laterality: N/A;     MEDICATIONS:  Prior to Admission medications   Medication Sig Start Date End Date Taking? Authorizing Provider  albuterol (PROVENTIL) (2.5 MG/3ML) 0.083% nebulizer solution Frequency:Q4HPRN   Dosage:0.0     Instructions:  Note:Dose: 1  [id number] 07/22/15   Yes Johnson, Megan P, DO  amphetamine-dextroamphetamine (ADDERALL) 20 MG tablet  12/18/17  Yes [provider]  DULoxetine (CYMBALTA) 30 MG capsule  12/18/17  Yes [provider]  levothyroxine (SYNTHROID, LEVOTHROID) 50 MCG tablet Take 1 tablet (50 mcg total) by mouth daily before breakfast. NAME BRAND ONLY PLEASE 09/27/17  Yes Johnson, Megan P, DO  oxyCODONE-acetaminophen (PERCOCET) 5-325 MG tablet Take 1 tablet by mouth every 6 (six) hours as needed for severe pain. 11/29/17 12/29/17 Yes Vevelyn Francois, NP  Vitamin D, Ergocalciferol, (DRISDOL) 50000 units CAPS capsule Take 1 capsule (50,000 Units total) by mouth every 7 (seven) days. 10/04/16  Yes Johnson, Megan P, DO  dicyclomine (BENTYL) 10 MG capsule Take 1 capsule (10 mg total) by mouth 4 (four) times daily -  before meals and at bedtime for 14 days. 04/18/17 11/29/17  Jonathon Bellows, MD  omeprazole (PRILOSEC) 40 MG capsule Take 1 capsule (40 mg total) by mouth daily. 09/24/17 11/29/17  Jonathon Bellows, MD     ALLERGIES:  Allergies  Allergen Reactions  . Ibuprofen Diarrhea and Nausea Only    Other Reaction: GI Upset  . Promethazine Rash     SOCIAL HISTORY:  Social History   Socioeconomic History  . Marital status: Married    Spouse name: Not on file  . Number of children: Not on file  . Years of education: Not on file  . Highest education level: Not on file  Occupational History  . Not on file  Social Needs  . Financial resource strain: Hard  . Food insecurity:  Worry: Sometimes true    Inability: Never true  . Transportation needs:    Medical: Yes    Non-medical: Yes  Tobacco Use  . Smoking status: Never Smoker  . Smokeless tobacco: Never Used  Substance and Sexual Activity  . Alcohol use: No  . Drug use: No  . Sexual activity: Not Currently    Birth control/protection: None  Lifestyle  . Physical activity:    Days per week: 0 days    Minutes per session: 0 min  . Stress: Not at all  Relationships  .  Social connections:    Talks on phone: Never    Gets together: Once a week    Attends religious service: More than 4 times per year    Active member of club or organization: Yes    Attends meetings of clubs or organizations: More than 4 times per year    Relationship status: Married  . Intimate partner violence:    Fear of current or ex partner: No    Emotionally abused: No    Physically abused: No    Forced sexual activity: No  Other Topics Concern  . Not on file  Social History Narrative  . Not on file    The patient currently resides (home / rehab facility / nursing home): Home The patient normally is (ambulatory / bedbound): Ambulatory  FAMILY HISTORY:  Family History  Problem Relation Age of Onset  . Diabetes Mother   . ADD / ADHD Mother   . Learning disabilities Mother   . COPD Mother   . Diabetes Father   . Hyperlipidemia Father   . Mental illness Father        Depression/anxiety  . Mental illness Sister        Depression/Anxiety  . Arthritis Brother        Rheumatoid  . Mental illness Daughter        anxiety  . Thyroid disease Daughter   . ADD / ADHD Daughter   . Allergies Daughter   . Cancer Maternal Grandmother        Skin, Lung  . Heart disease Maternal Grandfather   . Stroke Maternal Grandfather   . Heart attack Maternal Grandfather   . Arthritis Paternal Grandmother        Rheumatoid  . Dementia Paternal Grandmother   . Allergies Daughter     Otherwise negative/non-contributory.  REVIEW OF SYSTEMS:  Constitutional: denies any other weight loss, fever, chills, or sweats  Eyes: denies any other vision changes, history of eye injury  ENT: denies sore throat, hearing problems  Respiratory: denies shortness of breath, wheezing  Cardiovascular: denies chest pain, palpitations  Gastrointestinal: abdominal pain, N/V, and bowel function as per HPI Musculoskeletal: denies any other joint pains or cramps  Skin: Denies any other rashes or skin  discolorations Neurological: denies any other headache, dizziness, weakness  Psychiatric: Denies any other depression, anxiety   All other review of systems were otherwise negative   VITAL SIGNS:  BP 125/77   Pulse 90   Temp (!) 97.5 F (36.4 C) (Temporal)   Resp 13   Ht 5\' 1"  (1.549 m)   Wt 198 lb (89.8 kg)   LMP 12/27/2017   SpO2 96%   BMI 37.41 kg/m   PHYSICAL EXAM:  Constitutional:  -- Obese body habitus  -- Awake, alert, and oriented x3  Eyes:  -- Pupils equally round and reactive to light  -- No scleral icterus  Ear, nose, throat:  --  No jugular venous distension -- No nasal drainage, bleeding Pulmonary:  -- No crackles  -- Equal breath sounds bilaterally -- Breathing non-labored at rest Cardiovascular:  -- S1, S2 present  -- No pericardial rubs  Gastrointestinal:  -- Abdomen soft and non-distended with mild-/moderate- RUQ >> diffuse minimal non-specific abdominal tenderness to palpation, no guarding/rebound tenderness  -- No abdominal masses appreciated, pulsatile or otherwise  Musculoskeletal and Integumentary:  -- Wounds or skin discoloration: None appreciated -- Extremities: B/L UE and LE FROM, hands and feet warm, no edema  Neurologic:  -- Motor function: Intact and symmetric -- Sensation: Intact and symmetric  Labs:  CBC Latest Ref Rng & Units 01/30/2017 03/20/2013  WBC 3.4 - 10.8 x10E3/uL 10.4 8.2  Hemoglobin 11.1 - 15.9 g/dL 12.4 13.7  Hematocrit 34.0 - 46.6 % 37.7 39.9  Platelets 150 - 379 x10E3/uL 272 237   CMP Latest Ref Rng & Units 04/10/2017 03/20/2013  Glucose 65 - 99 mg/dL - 102(H)  BUN 7 - 18 mg/dL - 14  Creatinine 0.60 - 1.30 mg/dL - 0.86  Sodium 136 - 145 mmol/L - 137  Potassium 3.5 - 5.1 mmol/L - 3.6  Chloride 98 - 107 mmol/L - 103  CO2 21 - 32 mmol/L - 27  Calcium 8.5 - 10.1 mg/dL - 8.9  Total Protein 6.5 - 8.1 g/dL 8.2(H) -  Total Bilirubin 0.3 - 1.2 mg/dL 0.3 -  Alkaline Phos 38 - 126 U/L 63 -  AST 15 - 41 U/L 48(H) -  ALT 14 -  54 U/L 28 -   Imaging studies:  Limited RUQ Abdominal Ultrasound (03/22/2017) No gallstones or gallbladder wall thickening visualized. Common bile duct diameter measures 3 mm.  HIDA Nuclear Medicine Hepatobiliary Imaging (12/14/2017) Liver uptake of radiotracer is unremarkable. There is prompt visualization of gallbladder and small bowel, indicating patency of the cystic and common bile ducts. The patient consumed 8 ounces of Ensure orally with calculation of the computer generated ejection fraction of radiotracer from the gallbladder. The patient did experience abdominal pain with the oral Ensure consumption. The computer generated ejection fraction of radiotracer from the gallbladder is toward the lower limits of normal at 38%, normal greater than 33% using the oral agent.  Assessment/Plan: 36 y.o. female with biliary dyskinesia, complicated by pertinent comorbidities including morbid obesity (BMI >37), OSA, GERD with history of PUD, hypothyroidism, rheumatoid arthritis, fibromyalgia, generalized anxiety disorder, and major depression disorder.    - continue omeprazole once daily as prescribed (routine, not prn)             - avoid/minimize foods with higher fat content (meats, cheeses/dairy, and fried)             - prefer low-fat vegetables, whole grains (wheat bread, ceareals, etc), and fruits until cholecystectomy              - all risks, benefits, and alternatives to cholecystectomy, including possibility that cholecystectomy in this context may be as much diagnostic as potentially therapeutic, were discussed with the patient, all of her questions were answered to patient's expressed satisfaction, patient expresses she wishes to proceed, and informed consent was obtained.             - will plan for laparoscopic cholecystectomy next week on 10/25 per patient request pending anesthesia and OR availability             - anticipate return to clinic 2 weeks after above planned surgery              -  instructed to call if any questions or concerns  All of the above recommendations were discussed with the patient, and all of patient's questions were answered to her expressed satisfaction.  Thank you for the opportunity to participate in this patient's care.  -- Marilynne Drivers Rosana Hoes, MD, [place]: [facility] - Partnering for exceptional care. Office: [phone]

## 2017-12-27 NOTE — Patient Instructions (Addendum)
Patient scheduled to have gallbladder surgery with Dr.Davis at [facility] on 01/04/18. You will pre admit by phone.    Low-Fat Diet for Pancreatitis or Gallbladder Conditions A low-fat diet can be helpful if you have pancreatitis or a gallbladder condition. With these conditions, your pancreas and gallbladder have trouble digesting fats. A healthy eating plan with less fat will help rest your pancreas and gallbladder and reduce your symptoms. What do I need to know about this diet?  Eat a low-fat diet. ? Reduce your fat intake to less than 20-30% of your total daily calories. This is less than 50-60 g of fat per day. ? Remember that you need some fat in your diet. Ask your dietician what your daily goal should be. ? Choose nonfat and low-fat healthy foods. Look for the words "nonfat," "low fat," or "fat free." ? As a guide, look on the label and choose foods with less than 3 g of fat per serving. Eat only one serving.  Avoid alcohol.  Do not smoke. If you need help quitting, talk with your health care provider.  Eat small frequent meals instead of three large heavy meals. What foods can I eat? Grains Include healthy grains and starches such as potatoes, wheat bread, fiber-rich cereal, and brown rice. Choose whole grain options whenever possible. In adults, whole grains should account for 45-65% of your daily calories. Fruits and Vegetables Eat plenty of fruits and vegetables. Fresh fruits and vegetables add fiber to your diet. Meats and Other Protein Sources Eat lean meat such as chicken and pork. Trim any fat off of meat before cooking it. Eggs, fish, and beans are other sources of protein. In adults, these foods should account for 10-35% of your daily calories. Dairy Choose low-fat milk and dairy options. Dairy includes fat and protein, as well as calcium. Fats and Oils Limit high-fat foods such as fried foods, sweets, baked goods, sugary drinks. Other Creamy sauces and condiments, such as  mayonnaise, can add extra fat. Think about whether or not you need to use them, or use smaller amounts or low fat options. What foods are not recommended?  High fat foods, such as: ? [employer]. ? Ice cream. ? [place] toast. ? Sweet rolls. ? Pizza. ? Cheese bread. ? Foods covered with batter, butter, creamy sauces, or cheese. ? Fried foods. ? Sugary drinks and desserts.  Foods that cause gas or bloating This information is not intended to replace advice given to you by your health care provider. Make sure you discuss any questions you have with your health care provider. Document Released: 03/04/2013 Document Revised: 08/05/2015 Document Reviewed: 02/10/2013 Elsevier Interactive Patient Education  2017 [employer].

## 2017-12-31 ENCOUNTER — Inpatient Hospital Stay: Admission: RE | Admit: 2017-12-31 | Payer: Medicare Other | Source: Ambulatory Visit

## 2017-12-31 ENCOUNTER — Telehealth: Payer: Self-pay

## 2017-12-31 NOTE — Telephone Encounter (Signed)
The patient is scheduled for surgery with Dr Rosana Hoes on 01/04/18 and this will need to be rescheduled as the physician will be out of town.  The patient is rescheduled for surgery at [facility] on 01/14/18. She will pre admit by phone and is aware to call the Friday before on 01/11/18 to get her arrival time.

## 2018-01-01 ENCOUNTER — Other Ambulatory Visit: Payer: Self-pay

## 2018-01-01 ENCOUNTER — Encounter: Payer: Self-pay | Admitting: Student in an Organized Health Care Education/Training Program

## 2018-01-01 ENCOUNTER — Encounter: Payer: Medicare Other | Admitting: Nurse Practitioner

## 2018-01-01 ENCOUNTER — Encounter
Admission: RE | Admit: 2018-01-01 | Discharge: 2018-01-01 | Disposition: A | Discharge status: Home / Self Care | Payer: Medicare Other | Source: Ambulatory Visit | Attending: Surgery | Admitting: Surgery

## 2018-01-01 ENCOUNTER — Ambulatory Visit
Payer: Medicare Other | Attending: Nurse Practitioner | Admitting: Student in an Organized Health Care Education/Training Program

## 2018-01-01 VITALS — BP 117/71 | HR 89 | Temp 98.4°F | Resp 16 | Ht 61.0 in | Wt 200.0 lb

## 2018-01-01 DIAGNOSIS — Z8249 Family history of ischemic heart disease and other diseases of the circulatory system: Secondary | ICD-10-CM | POA: Insufficient documentation

## 2018-01-01 DIAGNOSIS — M2170 Unequal limb length (acquired), unspecified site: Secondary | ICD-10-CM | POA: Diagnosis not present

## 2018-01-01 DIAGNOSIS — Z886 Allergy status to analgesic agent status: Secondary | ICD-10-CM | POA: Diagnosis not present

## 2018-01-01 DIAGNOSIS — Z79890 Hormone replacement therapy (postmenopausal): Secondary | ICD-10-CM | POA: Insufficient documentation

## 2018-01-01 DIAGNOSIS — Z79899 Other long term (current) drug therapy: Secondary | ICD-10-CM | POA: Insufficient documentation

## 2018-01-01 DIAGNOSIS — G894 Chronic pain syndrome: Secondary | ICD-10-CM | POA: Diagnosis not present

## 2018-01-01 DIAGNOSIS — Z833 Family history of diabetes mellitus: Secondary | ICD-10-CM | POA: Diagnosis not present

## 2018-01-01 DIAGNOSIS — M26629 Arthralgia of temporomandibular joint, unspecified side: Secondary | ICD-10-CM

## 2018-01-01 DIAGNOSIS — F419 Anxiety disorder, unspecified: Secondary | ICD-10-CM | POA: Insufficient documentation

## 2018-01-01 DIAGNOSIS — R102 Pelvic and perineal pain: Secondary | ICD-10-CM | POA: Insufficient documentation

## 2018-01-01 DIAGNOSIS — K828 Other specified diseases of gallbladder: Secondary | ICD-10-CM | POA: Insufficient documentation

## 2018-01-01 DIAGNOSIS — M069 Rheumatoid arthritis, unspecified: Secondary | ICD-10-CM | POA: Diagnosis not present

## 2018-01-01 DIAGNOSIS — M797 Fibromyalgia: Secondary | ICD-10-CM | POA: Insufficient documentation

## 2018-01-01 DIAGNOSIS — M7918 Myalgia, other site: Secondary | ICD-10-CM | POA: Diagnosis not present

## 2018-01-01 DIAGNOSIS — Z79891 Long term (current) use of opiate analgesic: Secondary | ICD-10-CM | POA: Diagnosis not present

## 2018-01-01 DIAGNOSIS — R5382 Chronic fatigue, unspecified: Secondary | ICD-10-CM | POA: Diagnosis not present

## 2018-01-01 DIAGNOSIS — K219 Gastro-esophageal reflux disease without esophagitis: Secondary | ICD-10-CM | POA: Insufficient documentation

## 2018-01-01 DIAGNOSIS — Z8719 Personal history of other diseases of the digestive system: Secondary | ICD-10-CM | POA: Diagnosis not present

## 2018-01-01 DIAGNOSIS — E039 Hypothyroidism, unspecified: Secondary | ICD-10-CM | POA: Diagnosis not present

## 2018-01-01 DIAGNOSIS — M47816 Spondylosis without myelopathy or radiculopathy, lumbar region: Secondary | ICD-10-CM

## 2018-01-01 DIAGNOSIS — E559 Vitamin D deficiency, unspecified: Secondary | ICD-10-CM | POA: Diagnosis not present

## 2018-01-01 DIAGNOSIS — J45909 Unspecified asthma, uncomplicated: Secondary | ICD-10-CM | POA: Diagnosis not present

## 2018-01-01 DIAGNOSIS — Z5181 Encounter for therapeutic drug level monitoring: Secondary | ICD-10-CM | POA: Diagnosis not present

## 2018-01-01 DIAGNOSIS — F339 Major depressive disorder, recurrent, unspecified: Secondary | ICD-10-CM | POA: Diagnosis not present

## 2018-01-01 HISTORY — DX: Gastro-esophageal reflux disease without esophagitis: K21.9

## 2018-01-01 HISTORY — DX: Unspecified asthma, uncomplicated: J45.909

## 2018-01-01 MED ORDER — OXYCODONE-ACETAMINOPHEN 5-325 MG PO TABS: 1.0000 | ORAL_TABLET | Freq: Four times a day (QID) | ORAL | 0 refills | Status: DC | PRN

## 2018-01-01 MED ORDER — OXYCODONE-ACETAMINOPHEN 5-325 MG PO TABS: 1.0000 | ORAL_TABLET | Freq: Three times a day (TID) | ORAL | 0 refills | Status: DC | PRN

## 2018-01-01 NOTE — Patient Instructions (Signed)
You have been given 3 prescriptions for Oxycodone/acetamin. To last until 04/01/2018.

## 2018-01-01 NOTE — Progress Notes (Signed)
Nursing Pain Medication Assessment:  Safety precautions to be maintained throughout the outpatient stay will include: orient to surroundings, keep bed in low position, maintain call bell within reach at all times, provide assistance with transfer out of bed and ambulation.   Medication Inspection Compliance: Pill count conducted under aseptic conditions, in front of the patient. Neither the pills nor the bottle was removed from the patient's sight at any time. Once count was completed pills were immediately returned to the patient in their original bottle.  Medication: Oxycodone/APAP Pill/Patch Count: 0 of 120 pills remain Pill/Patch Appearance: Markings consistent with prescribed medication Bottle Appearance: Standard pharmacy container. Clearly labeled. Filled Date: 09 / 19 / 2019 Last Medication intake:  Yesterday

## 2018-01-01 NOTE — Patient Instructions (Signed)
Your procedure is scheduled on: 01-04-18  Report to Same Day Surgery 2nd floor medical mall [facility] Entrance-take elevator on left to 2nd floor.  Check in with surgery information desk.) To find out your arrival time please call [phone] between 1PM - 3PM on 01-03-18  Remember: Instructions that are not followed completely may result in serious medical risk, up to and including death, or upon the discretion of your surgeon and anesthesiologist your surgery may need to be rescheduled.    _x___ 1. Do not eat food after midnight the night before your procedure. You may drink clear liquids up to 2 hours before you are scheduled to arrive at the hospital for your procedure.  Do not drink clear liquids within 2 hours of your scheduled arrival to the hospital.  Clear liquids include  --Water or Apple juice without pulp  --Clear carbohydrate beverage such as ClearFast or Gatorade  --Black Coffee or Clear Tea (No milk, no creamers, do not add anything to the coffee or Tea   ____Ensure clear carbohydrate drink on the way to the hospital for bariatric patients  ____Ensure clear carbohydrate drink 3 hours before surgery for Dr Dwyane Luo patients if physician instructed.   No gum chewing or hard candies.     __x__ 2. No Alcohol for 24 hours before or after surgery.   __x__3. No Smoking or e-cigarettes for 24 prior to surgery.  Do not use any chewable tobacco products for at least 6 hour prior to surgery   ____  4. Bring all medications with you on the day of surgery if instructed.    __x__ 5. Notify your doctor if there is any change in your medical condition     (cold, fever, infections).    x___6. On the morning of surgery brush your teeth with toothpaste and water.  You may rinse your mouth with mouth wash if you wish.  Do not swallow any toothpaste or mouthwash.   Do not wear jewelry, make-up, hairpins, clips or nail polish.  Do not wear lotions, powders, or perfumes. You may wear  deodorant.  Do not shave 48 hours prior to surgery. Men may shave face and neck.  Do not bring valuables to the hospital.    [facility] is not responsible for any belongings or valuables.               Contacts, dentures or bridgework may not be worn into surgery.  Leave your suitcase in the car. After surgery it may be brought to your room.  For patients admitted to the hospital, discharge time is determined by your treatment team.  _  Patients discharged the day of surgery will not be allowed to drive home.  You will need someone to drive you home and stay with you the night of your procedure.    Please read over the following fact sheets that you were given:   [facility] Preparing for Surgery   _x___ TAKE THE FOLLOWING MEDICATION THE MORNING OF SURGERY WITH A SMALL SIP OF WATER. These include:  1. LEVOTHYROXINE  2. PRILOSEC  3. YOU MAY TAKE PERCOCET AM OF SURGERY IF NEEDED   4.  5.  6.  ____Fleets enema or Magnesium Citrate as directed.   ____ Use CHG Soap or sage wipes as directed on instruction sheet   _X___ Use inhalers on the day of surgery and bring to hospital day of surgery-USE YOUR ALBUTEROL INHALER DAY OF SURGERY AND [place]  ____  Stop Metformin and Janumet 2 days prior to surgery.    ____ Take 1/2 of usual insulin dose the night before surgery and none on the morning     surgery.   ____ Follow recommendations from Cardiologist, Pulmonologist or PCP regarding stopping Aspirin, Coumadin, Plavix ,Eliquis, Effient, or Pradaxa, and Pletal.  X____Stop Anti-inflammatories such as Advil, Aleve, Ibuprofen, Motrin, Naproxen, Naprosyn, Goodies powders or aspirin products. OK to take Tylenol OR PERCOCET   ____ Stop supplements until after surgery.     ____ Bring C-Pap to the hospital.
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
## 2018-01-05 LAB — TOXASSURE SELECT 13 (MW), URINE

## 2018-01-14 ENCOUNTER — Ambulatory Visit
Admission: RE | Admit: 2018-01-14 | Discharge: 2018-01-14 | Disposition: A | Discharge status: Home / Self Care | Payer: Medicare Other | Source: Ambulatory Visit | Attending: Surgery | Admitting: Surgery

## 2018-01-14 ENCOUNTER — Ambulatory Visit: Payer: Medicare Other | Admitting: Anesthesiology

## 2018-01-14 ENCOUNTER — Encounter
Admission: RE | Disposition: A | Discharge status: Home / Self Care | Payer: Self-pay | Source: Ambulatory Visit | Attending: Surgery

## 2018-01-14 ENCOUNTER — Other Ambulatory Visit: Payer: Self-pay

## 2018-01-14 ENCOUNTER — Encounter: Payer: Self-pay | Admitting: Emergency Medicine

## 2018-01-14 DIAGNOSIS — E039 Hypothyroidism, unspecified: Secondary | ICD-10-CM | POA: Diagnosis not present

## 2018-01-14 DIAGNOSIS — K219 Gastro-esophageal reflux disease without esophagitis: Secondary | ICD-10-CM | POA: Insufficient documentation

## 2018-01-14 DIAGNOSIS — Z79890 Hormone replacement therapy (postmenopausal): Secondary | ICD-10-CM | POA: Diagnosis not present

## 2018-01-14 DIAGNOSIS — F411 Generalized anxiety disorder: Secondary | ICD-10-CM | POA: Insufficient documentation

## 2018-01-14 DIAGNOSIS — Z6837 Body mass index (BMI) 37.0-37.9, adult: Secondary | ICD-10-CM | POA: Diagnosis not present

## 2018-01-14 DIAGNOSIS — G4733 Obstructive sleep apnea (adult) (pediatric): Secondary | ICD-10-CM | POA: Diagnosis not present

## 2018-01-14 DIAGNOSIS — F988 Other specified behavioral and emotional disorders with onset usually occurring in childhood and adolescence: Secondary | ICD-10-CM | POA: Diagnosis not present

## 2018-01-14 DIAGNOSIS — Z79899 Other long term (current) drug therapy: Secondary | ICD-10-CM | POA: Insufficient documentation

## 2018-01-14 DIAGNOSIS — J45909 Unspecified asthma, uncomplicated: Secondary | ICD-10-CM | POA: Diagnosis not present

## 2018-01-14 DIAGNOSIS — K828 Other specified diseases of gallbladder: Secondary | ICD-10-CM | POA: Diagnosis not present

## 2018-01-14 DIAGNOSIS — F329 Major depressive disorder, single episode, unspecified: Secondary | ICD-10-CM | POA: Diagnosis not present

## 2018-01-14 DIAGNOSIS — R109 Unspecified abdominal pain: Secondary | ICD-10-CM | POA: Diagnosis present

## 2018-01-14 DIAGNOSIS — K811 Chronic cholecystitis: Secondary | ICD-10-CM | POA: Insufficient documentation

## 2018-01-14 HISTORY — PX: CHOLECYSTECTOMY: SHX55

## 2018-01-14 LAB — POCT PREGNANCY, URINE: Preg Test, Ur: NEGATIVE

## 2018-01-14 SURGERY — LAPAROSCOPIC CHOLECYSTECTOMY
Anesthesia: General

## 2018-01-14 MED ORDER — EPHEDRINE SULFATE 50 MG/ML IJ SOLN
INTRAMUSCULAR | Status: AC
  Filled 2018-01-14: qty 1

## 2018-01-14 MED ORDER — CEFAZOLIN SODIUM-DEXTROSE 2-4 GM/100ML-% IV SOLN
INTRAVENOUS | Status: AC
  Filled 2018-01-14: qty 100

## 2018-01-14 MED ORDER — KETOROLAC TROMETHAMINE 30 MG/ML IJ SOLN
INTRAMUSCULAR | Status: AC
  Filled 2018-01-14: qty 1

## 2018-01-14 MED ORDER — CHLORHEXIDINE GLUCONATE CLOTH 2 % EX PADS: 6.0000 | MEDICATED_PAD | Freq: Once | CUTANEOUS | Status: DC

## 2018-01-14 MED ORDER — FENTANYL CITRATE (PF) 100 MCG/2ML IJ SOLN
INTRAMUSCULAR | Status: AC
  Filled 2018-01-14: qty 2

## 2018-01-14 MED ORDER — KETOROLAC TROMETHAMINE 30 MG/ML IJ SOLN
INTRAMUSCULAR | Status: DC | PRN
  Administered 2018-01-14: 30 mg via INTRAVENOUS

## 2018-01-14 MED ORDER — ACETAMINOPHEN 500 MG PO TABS
1000.0000 mg | ORAL_TABLET | ORAL | Status: AC
  Administered 2018-01-14: 1000 mg via ORAL

## 2018-01-14 MED ORDER — SUGAMMADEX SODIUM 200 MG/2ML IV SOLN
INTRAVENOUS | Status: DC | PRN
  Administered 2018-01-14: 181.4 mg via INTRAVENOUS

## 2018-01-14 MED ORDER — ACETAMINOPHEN 500 MG PO TABS
ORAL_TABLET | ORAL | Status: AC
  Filled 2018-01-14: qty 2

## 2018-01-14 MED ORDER — HYDROMORPHONE HCL 1 MG/ML IJ SOLN
INTRAMUSCULAR | Status: AC
  Administered 2018-01-14: 0.5 mg via INTRAVENOUS
  Filled 2018-01-14: qty 1

## 2018-01-14 MED ORDER — ROCURONIUM BROMIDE 100 MG/10ML IV SOLN
INTRAVENOUS | Status: DC | PRN
  Administered 2018-01-14: 45 mg via INTRAVENOUS
  Administered 2018-01-14: 5 mg via INTRAVENOUS

## 2018-01-14 MED ORDER — LACTATED RINGERS IV SOLN
INTRAVENOUS | Status: DC
  Administered 2018-01-14 (×2): via INTRAVENOUS

## 2018-01-14 MED ORDER — FENTANYL CITRATE (PF) 100 MCG/2ML IJ SOLN
INTRAMUSCULAR | Status: DC | PRN
  Administered 2018-01-14 (×3): 50 ug via INTRAVENOUS

## 2018-01-14 MED ORDER — PROPOFOL 10 MG/ML IV BOLUS
INTRAVENOUS | Status: DC | PRN
  Administered 2018-01-14: 130 mg via INTRAVENOUS

## 2018-01-14 MED ORDER — HYDROMORPHONE HCL 1 MG/ML IJ SOLN
0.5000 mg | INTRAMUSCULAR | Status: AC | PRN
  Administered 2018-01-14 (×4): 0.5 mg via INTRAVENOUS

## 2018-01-14 MED ORDER — DEXAMETHASONE SODIUM PHOSPHATE 10 MG/ML IJ SOLN
INTRAMUSCULAR | Status: AC
  Filled 2018-01-14: qty 1

## 2018-01-14 MED ORDER — OXYCODONE-ACETAMINOPHEN 5-325 MG PO TABS
ORAL_TABLET | ORAL | Status: AC
  Administered 2018-01-14: 1
  Filled 2018-01-14: qty 1

## 2018-01-14 MED ORDER — FENTANYL CITRATE (PF) 100 MCG/2ML IJ SOLN
INTRAMUSCULAR | Status: AC
  Administered 2018-01-14: 25 ug via INTRAVENOUS
  Filled 2018-01-14: qty 2

## 2018-01-14 MED ORDER — LIDOCAINE HCL (CARDIAC) PF 100 MG/5ML IV SOSY
PREFILLED_SYRINGE | INTRAVENOUS | Status: DC | PRN
  Administered 2018-01-14: 40 mg via INTRAVENOUS

## 2018-01-14 MED ORDER — SUCCINYLCHOLINE CHLORIDE 20 MG/ML IJ SOLN
INTRAMUSCULAR | Status: DC | PRN
  Administered 2018-01-14: 100 mg via INTRAVENOUS

## 2018-01-14 MED ORDER — OXYCODONE-ACETAMINOPHEN 5-325 MG PO TABS: 1.0000 | ORAL_TABLET | Freq: Four times a day (QID) | ORAL | 0 refills | Status: DC | PRN

## 2018-01-14 MED ORDER — ONDANSETRON HCL 4 MG/2ML IJ SOLN
4.0000 mg | Freq: Once | INTRAMUSCULAR | Status: AC | PRN
  Administered 2018-01-14: 4 mg via INTRAVENOUS

## 2018-01-14 MED ORDER — LIDOCAINE HCL 1 % IJ SOLN
INTRAMUSCULAR | Status: DC | PRN
  Administered 2018-01-14: 20 mL

## 2018-01-14 MED ORDER — MIDAZOLAM HCL 2 MG/2ML IJ SOLN
INTRAMUSCULAR | Status: DC | PRN
  Administered 2018-01-14: 2 mg via INTRAVENOUS

## 2018-01-14 MED ORDER — ONDANSETRON HCL 4 MG/2ML IJ SOLN
INTRAMUSCULAR | Status: AC
  Administered 2018-01-14: 4 mg via INTRAVENOUS
  Filled 2018-01-14: qty 2

## 2018-01-14 MED ORDER — OXYCODONE-ACETAMINOPHEN 5-325 MG PO TABS: 1.0000 | ORAL_TABLET | ORAL | 0 refills | Status: DC | PRN

## 2018-01-14 MED ORDER — ONDANSETRON HCL 4 MG/2ML IJ SOLN
INTRAMUSCULAR | Status: AC
  Filled 2018-01-14: qty 2

## 2018-01-14 MED ORDER — ROCURONIUM BROMIDE 50 MG/5ML IV SOLN
INTRAVENOUS | Status: AC
  Filled 2018-01-14: qty 1

## 2018-01-14 MED ORDER — GABAPENTIN 300 MG PO CAPS
300.0000 mg | ORAL_CAPSULE | ORAL | Status: AC
  Administered 2018-01-14: 300 mg via ORAL

## 2018-01-14 MED ORDER — PROPOFOL 10 MG/ML IV BOLUS
INTRAVENOUS | Status: AC
  Filled 2018-01-14: qty 20

## 2018-01-14 MED ORDER — ONDANSETRON HCL 4 MG/2ML IJ SOLN
INTRAMUSCULAR | Status: DC | PRN
  Administered 2018-01-14: 4 mg via INTRAVENOUS

## 2018-01-14 MED ORDER — MIDAZOLAM HCL 2 MG/2ML IJ SOLN
INTRAMUSCULAR | Status: AC
  Filled 2018-01-14: qty 2

## 2018-01-14 MED ORDER — FENTANYL CITRATE (PF) 100 MCG/2ML IJ SOLN
25.0000 ug | INTRAMUSCULAR | Status: AC | PRN
  Administered 2018-01-14 (×6): 25 ug via INTRAVENOUS

## 2018-01-14 MED ORDER — DEXAMETHASONE SODIUM PHOSPHATE 10 MG/ML IJ SOLN
INTRAMUSCULAR | Status: DC | PRN
  Administered 2018-01-14: 10 mg via INTRAVENOUS

## 2018-01-14 MED ORDER — OXYCODONE-ACETAMINOPHEN 5-325 MG PO TABS: 1.0000 | ORAL_TABLET | Freq: Once | ORAL | Status: DC

## 2018-01-14 MED ORDER — LIDOCAINE HCL (PF) 1 % IJ SOLN
INTRAMUSCULAR | Status: AC
  Filled 2018-01-14: qty 30

## 2018-01-14 MED ORDER — CEFAZOLIN SODIUM-DEXTROSE 2-4 GM/100ML-% IV SOLN
2.0000 g | INTRAVENOUS | Status: AC
  Administered 2018-01-14: 2 g via INTRAVENOUS

## 2018-01-14 MED ORDER — BUPIVACAINE HCL (PF) 0.5 % IJ SOLN
INTRAMUSCULAR | Status: AC
  Filled 2018-01-14: qty 30

## 2018-01-14 MED ORDER — GABAPENTIN 300 MG PO CAPS
ORAL_CAPSULE | ORAL | Status: AC
  Administered 2018-01-14: 300 mg via ORAL
  Filled 2018-01-14: qty 1

## 2018-01-14 SURGICAL SUPPLY — 36 items
APPLIER CLIP ROT 10 11.4 M/L (STAPLE) ×3
CHLORAPREP W/TINT 26ML (MISCELLANEOUS) ×3 IMPLANT
CLIP APPLIE ROT 10 11.4 M/L (STAPLE) ×1 IMPLANT
COVER WAND RF STERILE (DRAPES) IMPLANT
DECANTER SPIKE VIAL GLASS SM (MISCELLANEOUS) ×6 IMPLANT
DERMABOND ADVANCED (GAUZE/BANDAGES/DRESSINGS) ×2
DERMABOND ADVANCED .7 DNX12 (GAUZE/BANDAGES/DRESSINGS) ×1 IMPLANT
DRESSING SURGICEL FIBRLLR 1X2 (HEMOSTASIS) IMPLANT
DRSG SURGICEL FIBRILLAR 1X2 (HEMOSTASIS)
ELECT REM PT RETURN 9FT ADLT (ELECTROSURGICAL) ×3
ELECTRODE REM PT RTRN 9FT ADLT (ELECTROSURGICAL) ×1 IMPLANT
GLOVE BIO SURGEON STRL SZ7 (GLOVE) ×9 IMPLANT
GLOVE BIOGEL PI IND STRL 7.5 (GLOVE) ×3 IMPLANT
GLOVE BIOGEL PI INDICATOR 7.5 (GLOVE) ×6
GOWN STRL REUS W/ TWL LRG LVL3 (GOWN DISPOSABLE) ×3 IMPLANT
GOWN STRL REUS W/TWL LRG LVL3 (GOWN DISPOSABLE) ×6
GRASPER SUT TROCAR 14GX15 (MISCELLANEOUS) ×3 IMPLANT
IRRIGATION STRYKERFLOW (MISCELLANEOUS) IMPLANT
IRRIGATOR STRYKERFLOW (MISCELLANEOUS)
IV NS 1000ML (IV SOLUTION) ×2
IV NS 1000ML BAXH (IV SOLUTION) ×1 IMPLANT
KIT TURNOVER KIT A (KITS) ×3 IMPLANT
NEEDLE HYPO 22GX1.5 SAFETY (NEEDLE) ×3 IMPLANT
NEEDLE INSUFFLATION 14GA 120MM (NEEDLE) ×3 IMPLANT
NS IRRIG 1000ML POUR BTL (IV SOLUTION) ×3 IMPLANT
PACK LAP CHOLECYSTECTOMY (MISCELLANEOUS) ×3 IMPLANT
PORT ACCESS TROCAR AIRSEAL 12 (TROCAR) ×1 IMPLANT
PORT ACCESS TROCAR AIRSEAL 5M (TROCAR) ×2
POUCH SPECIMEN RETRIEVAL 10MM (ENDOMECHANICALS) ×3 IMPLANT
SCISSORS METZENBAUM CVD 33 (INSTRUMENTS) ×3 IMPLANT
SET TRI-LUMEN FLTR TB AIRSEAL (TUBING) ×3 IMPLANT
SLEEVE ENDOPATH XCEL 5M (ENDOMECHANICALS) ×6 IMPLANT
SUT MNCRL AB 4-0 PS2 18 (SUTURE) ×3 IMPLANT
SUT VICRYL 0 UR6 27IN ABS (SUTURE) ×3 IMPLANT
SUT VICRYL AB 3-0 FS1 BRD 27IN (SUTURE) ×3 IMPLANT
TROCAR XCEL NON-BLD 5MMX100MML (ENDOMECHANICALS) ×3 IMPLANT

## 2018-01-14 NOTE — Anesthesia Postprocedure Evaluation (Signed)
Anesthesia Post Note  Patient: Sharon Rodriguez  Procedure(s) Performed: LAPAROSCOPIC CHOLECYSTECTOMY (N/A )  Patient location during evaluation: PACU Anesthesia Type: General Level of consciousness: awake and alert Pain management: pain level controlled Vital Signs Assessment: post-procedure vital signs reviewed and stable Respiratory status: spontaneous breathing, nonlabored ventilation, respiratory function stable and patient connected to nasal cannula oxygen Cardiovascular status: blood pressure returned to baseline and stable Postop Assessment: no apparent nausea or vomiting Anesthetic complications: no     Last Vitals:  Vitals:   01/14/18 1213 01/14/18 1218  BP: 111/68   Pulse: 95 87  Resp: 15 15  Temp:    SpO2: 99% 98%    Last Pain:  Vitals:   01/14/18 1218  TempSrc:   PainSc: [place] Vaudie Engebretsen

## 2018-01-14 NOTE — Anesthesia Preprocedure Evaluation (Signed)
Anesthesia Evaluation  Patient identified by MRN, date of birth, ID band Patient awake    Reviewed: Allergy & Precautions, H&P , NPO status , Patient's Chart, lab work & pertinent test results, reviewed documented beta blocker date and time   Airway Mallampati: II  TM Distance: >3 FB Neck ROM: full    Dental  (+) Teeth Intact   Pulmonary asthma ,    Pulmonary exam normal        Cardiovascular Exercise Tolerance: Good negative cardio ROS Normal cardiovascular exam Rhythm:regular Rate:Normal     Neuro/Psych PSYCHIATRIC DISORDERS Anxiety Depression  Neuromuscular disease    GI/Hepatic Neg liver ROS, GERD  Medicated,  Endo/Other  Hypothyroidism   Renal/GU negative Renal ROS  negative genitourinary   Musculoskeletal   Abdominal   Peds  Hematology negative hematology ROS (+)   Anesthesia Other Findings Past Medical History: No date: ADD (attention deficit disorder) No date: Anxiety No date: Asthma     Comment:  WELL CONTROLLED No date: Chronic bronchitis (HCC) No date: Chronic fatigue No date: Chronic pain No date: Depression No date: Fibromyalgia No date: GERD (gastroesophageal reflux disease) 2019: History of stomach ulcers 01/27/2015: Hypothyroidism No date: Learning disability No date: Osteoarthritis No date: Rheumatoid arthritis ([place]) No date: Thyroid disease No date: TMJ (dislocation of temporomandibular joint) Past Surgical History: No date: CESAREAN SECTION     Comment:  x 2 12/07/2017: ESOPHAGOGASTRODUODENOSCOPY (EGD) WITH PROPOFOL; N/A     Comment:  Procedure: ESOPHAGOGASTRODUODENOSCOPY (EGD) WITH               PROPOFOL;  Surgeon: Jonathon Bellows, MD;  Location: [facility]               ENDOSCOPY;  Service: Gastroenterology;  Laterality: N/A; BMI    Body Mass Index:  37.79 kg/m     Reproductive/Obstetrics negative OB ROS                             Anesthesia  Physical Anesthesia Plan  ASA: II  Anesthesia Plan: General ETT   Post-op Pain Management:    Induction:   PONV Risk Score and Plan:   Airway Management Planned:   Additional Equipment:   Intra-op Plan:   Post-operative Plan:   Informed Consent: I have reviewed the patients History and Physical, chart, labs and discussed the procedure including the risks, benefits and alternatives for the proposed anesthesia with the patient or authorized representative who has indicated his/her understanding and acceptance.   Dental Advisory Given  Plan Discussed with: CRNA  Anesthesia Plan Comments: (Pt with very short HMD and small mouth with hx of TMJ//// Discussed issues regarding possible difficult intubation and soar throat post op.  JA)        Anesthesia Quick Evaluation

## 2018-01-14 NOTE — Anesthesia Procedure Notes (Signed)
Procedure Name: Intubation Date/Time: 01/14/2018 9:37 AM Performed by: Allean Found, CRNA Pre-anesthesia Checklist: Patient identified, Patient being monitored, Timeout performed, Emergency Drugs available and Suction available Patient Re-evaluated:Patient Re-evaluated prior to induction Oxygen Delivery Method: Circle system utilized Preoxygenation: Pre-oxygenation with 100% oxygen Induction Type: IV induction Ventilation: Mask ventilation without difficulty Laryngoscope Size: 3 and McGraph Grade View: Grade II Tube type: Oral Tube size: 7.0 mm Number of attempts: 1 Airway Equipment and Method: Stylet Placement Confirmation: ETT inserted through vocal cords under direct vision,  positive ETCO2 and breath sounds checked- equal and bilateral Secured at: 21 cm Tube secured with: Tape Dental Injury: Teeth and Oropharynx as per pre-operative assessment

## 2018-01-14 NOTE — Discharge Instructions (Addendum)
In addition to included general post-operative instructions for Laparoscopic Cholecystectomy,  Diet: Resume home heart healthy diet (may prefer to start with low fat diet as discussed).   Activity: No heavy lifting >15 - 20 pounds (children, pets, laundry, garbage) or strenuous activity until follow-up, but light activity and walking are encouraged. Do not drive or drink alcohol if taking narcotic pain medications.  Wound care: 2 days after surgery (Wednesday, 11/6), you may shower/get incision wet with soapy water and pat dry (do not rub incisions), but no baths or submerging incision underwater until follow-up.   Medications: Resume all home medications. For mild to moderate pain: acetaminophen (Tylenol) or ibuprofen/naproxen (if no kidney disease). Combining Tylenol with alcohol can substantially increase your risk of causing liver disease. Narcotic pain medications, if prescribed, can be used for severe pain, though may cause nausea, constipation, and drowsiness. Do not combine Tylenol and Percocet (or similar) within a 6 hour period as Percocet (and similar) contain(s) Tylenol. If you do not need the narcotic pain medication, you do not need to fill the prescription.  Call office [phone]) at any time if any questions, worsening pain, fevers/chills, bleeding, drainage from incision site, or other concerns.    AMBULATORY SURGERY  DISCHARGE INSTRUCTIONS   1) The drugs that you were given will stay in your system until tomorrow so for the next 24 hours you should not:  A) Drive an automobile B) Make any legal decisions C) Drink any alcoholic beverage   2) You may resume regular meals tomorrow.  Today it is better to start with liquids and gradually work up to solid foods.  You may eat anything you prefer, but it is better to start with liquids, then soup and crackers, and gradually work up to solid foods.   3) Please notify your doctor immediately if you have any unusual  bleeding, trouble breathing, redness and pain at the surgery site, drainage, fever, or pain not relieved by medication.    4) Additional Instructions:        Please contact your physician with any problems or Same Day Surgery at [phone], Monday through Friday 6 am to 4 pm, or [place] at [facility] number at [phone].

## 2018-01-14 NOTE — Op Note (Signed)
SURGICAL OPERATIVE REPORT   DATE OF PROCEDURE: 01/14/2018  ATTENDING Surgeon(s): Vickie Epley, MD  ANESTHESIA: GETA  PRE-OPERATIVE DIAGNOSIS: Biliary Dyskinesia (K81.9)  POST-OPERATIVE DIAGNOSIS: Biliary Dyskinesia (K81.9)  PROCEDURE(S): (cpt's: 97353) 1.) Laparoscopic Cholecystectomy  INTRAOPERATIVE FINDINGS: Mild pericholecystic inflammation with cystic duct and cystic artery clips well-secured, hemostasis at completion of procedure  INTRAOPERATIVE FLUIDS: 450 mL crystalloid   ESTIMATED BLOOD LOSS: Minimal (<30 mL)   URINE OUTPUT: No foley  SPECIMENS: Gallbladder  IMPLANTS: None  DRAINS: None   COMPLICATIONS: None apparent   CONDITION AT COMPLETION: Hemodynamically stable and extubated  DISPOSITION: PACU   INDICATION(S) FOR PROCEDURE:  Patient is a 37 y.o. female who recently presented with post-prandial RUQ > epigastric abdominal pain after eating fatty foods in particular. Ultrasound did not reveal cholelithiasis or sonographic evidence of cholecystitis, and patient has a history of GERD with PUD, for which she was referred to GI. Following an unremarkable comprehensive workup/assessment for PUD, HIDA was ordered by GI, which revealed biliary dyskinesia, for which she was referred for cholecystectomy. All risks, benefits, and alternatives to above elective procedures were discussed with the patient, who elected to proceed, and informed consent was accordingly obtained at that time.   DETAILS OF PROCEDURE:  Patient was brought to the operating suite and appropriately identified. General anesthesia was administered along with peri-operative prophylactic IV antibiotics, and endotracheal intubation was performed by anesthesiologist, along with OG tube for gastric decompression. In supine position, operative site was prepped and draped in usual sterile fashion, and following a brief time out, initial 5 mm incision was made in a natural skin crease just above the  umbilicus. Fascia was then elevated, and a Verress needle was inserted and its proper position confirmed using aspiration and saline meniscus test.  Upon insufflation of the abdominal cavity with carbon dioxide to a well-tolerated pressure of 12-15 mmHg, 5 mm peri-umbilical port followed by laparoscope were inserted and used to inspect the abdominal cavity and its contents with no injuries from insertion of the first trochar noted. Three additional trocars were inserted, one at the epigastric position (12 mm AirSeal) and two along the Right costal margin (5 mm). The table was then placed in reverse Trendelenburg position with the Right side up. Filmy adhesions between the gallbladder and omentum/duodenum/transverse colon were lysed using combined blunt dissection and selective electrocautery. The apex/dome of the gallbladder was grasped with an atraumatic grasper passed through the lateral port and retracted apically over the liver. The infundibulum was also grasped and retracted, exposing Calot's triangle. Interestingly, the liver was found to envelope patient's cystic duct and cystic artery, possibly contributing to her reported biliary dyskinesia. The peritoneum overlying the gallbladder infundibulum was incised and dissected free of surrounding peritoneal attachments, revealing the cystic duct and cystic artery, which were clipped twice on the patient side and once on the gallbladder specimen side close to the gallbladder. The gallbladder was then dissected from its peritoneal attachments to the liver using electrocautery, and the gallbladder was placed into a laparoscopic specimen bag and removed from the abdominal cavity via the epigastric port site. Hemostasis and secure placement of clips were confirmed, and intra-peritoneal cavity was inspected with no additional findings. PMI laparoscopic fascial closure device was then used to re-approximate fascia at the 12 mm epigastric port site.  All ports were  then removed under direct visualization, and abdominal cavity was desuflated. All port sites were irrigated/cleaned, additional local anesthetic was injected at each incision, 3-0 Vicryl was used to re-approximate dermis  at 12 mm epigastric port site(s), and subcuticular 4-0 Monocryl suture was used to re-approximate skin. Skin was then cleaned, dried, and sterile skin glue was applied. Patient was then safely able to be awakened, extubated, and transferred to PACU for post-operative monitoring and care.   I was present for all aspects of the above procedure, and no operative complications were apparent.

## 2018-01-14 NOTE — Interval H&P Note (Signed)
History and Physical Interval Note:  01/14/2018 9:00 AM  Sharon Rodriguez  has presented today for surgery, with the diagnosis of biliary dyskinesia  The various methods of treatment have been discussed with the patient and family. After consideration of risks, benefits and other options for treatment, the patient has consented to  Procedure(s): LAPAROSCOPIC CHOLECYSTECTOMY (N/A) as a surgical intervention .  The patient's history has been reviewed, patient examined, no change in status, stable for surgery.  I have reviewed the patient's chart and labs.  Questions were answered to the patient's satisfaction.     Vickie Epley

## 2018-01-14 NOTE — Anesthesia Post-op Follow-up Note (Addendum)
Anesthesia QCDR form completed.        

## 2018-01-14 NOTE — Transfer of Care (Addendum)
Immediate Anesthesia Transfer of Care Note  Patient: Sharon Rodriguez  Procedure(s) Performed: LAPAROSCOPIC CHOLECYSTECTOMY (N/A )  Patient Location: PACU  Anesthesia Type:General  Level of Consciousness: awake and alert   Airway & Oxygen Therapy: Patient Spontanous Breathing and Patient connected to face mask oxygen  Post-op Assessment: Report given to RN and Post -op Vital signs reviewed and stable  Post vital signs: Reviewed and stable  Last Vitals:  Vitals Value Taken Time  BP    Temp    Pulse    Resp    SpO2      Last Pain:  Vitals:   01/14/18 0829  TempSrc: Tympanic  PainSc: 0-No pain         Complications: No apparent anesthesia complications

## 2018-01-15 LAB — SURGICAL PATHOLOGY

## 2018-01-18 ENCOUNTER — Telehealth: Payer: Self-pay | Admitting: *Deleted

## 2018-01-18 NOTE — Telephone Encounter (Signed)
Patient states she is having a lot of soreness. She states she is taking the percocet every two to three hours. Advised patient not to take it that close together. She may take Tylenol in between.  Advised patient to use a heatpad on the port sites. No fever or chills .

## 2018-01-18 NOTE — Telephone Encounter (Signed)
Called patient to follow up with her regarding post-surgical pain. Patient denies incisional redness/drainage or fever/chills and describes her pain is to the Right of her epigastric incision, which is common after laparoscopic cholecystectomy. She reports she has been taking 1 Percocet 5/325 mg every 2 - 3 hours. Patient was instructed she can take up to 2 Percocet 5/325 mg every 6 hours, equivalent to 1 Percocet every 3 hours. Though ibuprofen is listed as an allergy (with symptoms not typical of allergic reactions), patient says she routinely takes and has tolerated naproxen without any adverse reactions. Patient was advised to alternate naproxen with Percocet to help extend the duration of time between Percocets. Ice and/or heat may also be similarly helpful. Patient was advised to follow up as scheduled and call if any questions/concerns.  -- Marilynne Drivers Rosana Hoes, MD, [place]: [facility] - Partnering for exceptional care. Office: [phone]

## 2018-01-24 ENCOUNTER — Encounter: Payer: Self-pay | Admitting: Surgery

## 2018-01-24 ENCOUNTER — Ambulatory Visit (INDEPENDENT_AMBULATORY_CARE_PROVIDER_SITE_OTHER): Payer: Medicare Other

## 2018-01-24 ENCOUNTER — Other Ambulatory Visit: Payer: Self-pay

## 2018-01-24 ENCOUNTER — Ambulatory Visit (INDEPENDENT_AMBULATORY_CARE_PROVIDER_SITE_OTHER): Payer: Medicare Other | Admitting: Surgery

## 2018-01-24 VITALS — BP 106/62 | HR 91 | Temp 97.7°F | Resp 12 | Ht 61.0 in | Wt 200.0 lb

## 2018-01-24 DIAGNOSIS — Z4889 Encounter for other specified surgical aftercare: Secondary | ICD-10-CM

## 2018-01-24 DIAGNOSIS — Z23 Encounter for immunization: Secondary | ICD-10-CM | POA: Diagnosis not present

## 2018-01-24 DIAGNOSIS — K828 Other specified diseases of gallbladder: Secondary | ICD-10-CM

## 2018-01-24 NOTE — Patient Instructions (Addendum)
The patient is aware to call back for any questions or new concerns. Recommend to continue to take Prilosec Follow up as needed The patient is aware to use a heating pad as needed for comfort.

## 2018-01-24 NOTE — Progress Notes (Signed)
Surgical Clinic Progress/Follow-up Note   HPI:  37 y.o. Female presents to clinic for post-op follow-up just over 1 week s/p laparoscopic cholecystectomy Rosana Hoes, 01/14/2018) for biliary dyskinesia. Patient reports her Right of epigastric peri-incisional pain has improved and she's been tolerating regular diet with +flatus and normal BM's following constipation x first 3 - 4 days after surgery, she has finished all of her prescribed post-surgical narcotic pain medications, though has been continuing to take her chronic narcotics for chronic pain. She also self-discontinued her omeprazole following surgery and describes belching and nausea consistent with reflux. She otherwise denies fever/chills, CP, or SOB.  Review of Systems:  Constitutional: denies fever/chills  Respiratory: denies shortness of breath, wheezing  Cardiovascular: denies chest pain, palpitations  Gastrointestinal: abdominal pain, N/V, and bowel function as per interval history Skin: Denies any other rashes or skin discolorations except post-surgical wounds as per interval history  Vital Signs:  BP 106/62   Pulse 91   Temp 97.7 F (36.5 C) (Skin)   Resp 12   Ht 5\' 1"  (1.549 m)   Wt 200 lb (90.7 kg)   LMP 01/22/2018   SpO2 98%   BMI 37.79 kg/m    Physical Exam:  Constitutional:  -- Obese body habitus  -- Awake, alert, and oriented x3  Pulmonary:  -- No crackles -- Equal breath sounds bilaterally -- Breathing non-labored at rest Cardiovascular:  -- S1, S2 present  -- No pericardial rubs  Gastrointestinal:  -- Soft and non-distended with mild Right-of-epigastric peri-incisional tenderness to palpation, no guarding/rebound tenderness -- Post-surgical incisions all well-approximated without any peri-incisional erythema or drainage -- No abdominal masses appreciated, pulsatile or otherwise  Musculoskeletal / Integumentary:  -- Wounds or skin discoloration: None appreciated except post-surgical incisions as  described above (GI) -- Extremities: B/L UE and LE FROM, hands and feet warm, no edema   Imaging: No new pertinent imaging available for review  Assessment:  37 y.o. yo Female with a problem list including...  Patient Active Problem List   Diagnosis Date Noted  . Biliary dyskinesia 12/27/2017  . Gastroesophageal reflux disease 12/27/2017  . Chronic bilateral low back pain without sciatica 11/29/2017  . Chronic pain syndrome 11/29/2017  . Long term current use of opiate analgesic 11/29/2017  . Spondylosis without myelopathy or radiculopathy, lumbar region 09/24/2017  . Myofascial pain 09/24/2017  . Pelvic pain in female 05/10/2017  . Dysmenorrhea 05/10/2017  . Pubic bone pain 05/10/2017  . Controlled substance agreement signed 10/03/2016  . Vitamin D deficiency 07/06/2016  . Lower limb length difference 02/27/2015  . Hypothyroidism 01/27/2015  . Chronic pelvic pain in female 01/27/2015  . Anxiety   . Rheumatoid arthritis ([place])   . Depression   . Fibromyalgia   . TMJ pain dysfunction syndrome   . Chronic bronchitis ([place])   . Fatigue   . Chronic pain   . ADD (attention deficit disorder)   . Learning disability   . Chronic pain disorder 02/25/2014  . Acne 08/28/2011  . Benign neoplasm of skin of trunk 08/28/2011  . 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    presents to clinic for post-op follow-up evaluation, doing overall well from a surgical perspective 10 Days s/p laparoscopic cholecystectomy Rosana Hoes, 01/14/2018) for biliary dyskinesia.  Plan:              - advance diet as tolerated  - resume and continue routine once daily omeprazole              - okay to  shower and in 1 week, may submerge incisions under water prn             - no heavy lifting >15 - 20 lbs until 2 weeks after surgery, after which may gradually resume all activities over the following 2 weeks             - apply sunblock particularly to incisions with sun exposure to reduce pigmentation of scars              - return to clinic as needed, instructed to call office if any questions or concerns  All of the above recommendations were discussed with the patient, and all of patient's questions were answered to his expressed satisfaction.  -- Marilynne Drivers Rosana Hoes, MD, [place]: [facility] - Partnering for exceptional care. Office: [phone]

## 2018-01-24 NOTE — Patient Instructions (Signed)

## 2018-02-02 DIAGNOSIS — R1011 Right upper quadrant pain: Secondary | ICD-10-CM | POA: Diagnosis not present

## 2018-02-02 DIAGNOSIS — R109 Unspecified abdominal pain: Secondary | ICD-10-CM | POA: Diagnosis not present

## 2018-02-02 DIAGNOSIS — K5900 Constipation, unspecified: Secondary | ICD-10-CM | POA: Diagnosis not present

## 2018-02-02 DIAGNOSIS — Z79899 Other long term (current) drug therapy: Secondary | ICD-10-CM | POA: Diagnosis not present

## 2018-02-02 DIAGNOSIS — G8929 Other chronic pain: Secondary | ICD-10-CM | POA: Diagnosis not present

## 2018-02-02 DIAGNOSIS — Z9049 Acquired absence of other specified parts of digestive tract: Secondary | ICD-10-CM | POA: Diagnosis not present

## 2018-02-02 DIAGNOSIS — E039 Hypothyroidism, unspecified: Secondary | ICD-10-CM | POA: Diagnosis not present

## 2018-02-04 ENCOUNTER — Telehealth: Payer: Self-pay | Admitting: *Deleted

## 2018-02-04 ENCOUNTER — Telehealth: Payer: Self-pay | Admitting: Family Medicine

## 2018-02-04 NOTE — Telephone Encounter (Signed)
Unfortunately, I don't think I have anything available. Can we see what's going on with her?

## 2018-02-04 NOTE — Telephone Encounter (Signed)
She can pick up magnesium citrate over the counter. She can also take her miralax 3x a day. That should help.

## 2018-02-04 NOTE — Telephone Encounter (Signed)
Copied from [place] [id number]. Topic: Appointment Scheduling - Scheduling Inquiry for Clinic >> Feb 04, 2018  8:31 AM Janace Aris A wrote: Reason for CRM: PT called in wanting to be seen today with a provider, offered pt the days and times that were available- 8:45am with Dr. Wynetta Emery. Pt denied, she says she would like to be worked in or she will go into the ED >> Feb 04, 2018  8:37 AM Don Perking M wrote: Agent suggested urgent care but pt stated that she couldn't wait. >> Feb 04, 2018  9:02 AM Reel, Rexene Edison, CMA wrote: Juluis Rainier

## 2018-02-04 NOTE — Telephone Encounter (Signed)
Patient called and stated that she is still having some right abdominal side pain, she stated that she had surgery on 01/14/18 by Dr.Davis. Please call and advise

## 2018-02-04 NOTE — Telephone Encounter (Signed)
Patient notified

## 2018-02-04 NOTE — Telephone Encounter (Signed)
Called and left a message asking patient to return my call.  

## 2018-02-04 NOTE — Telephone Encounter (Signed)
Patient return my call, she states that she went to the ER over the weekend, they told her that she was backed up. She states that they told her to do Miralax and Enema's, she states that she is now having bowel movements, but still in severe pain in her right side and back, and that she is having some difficulty breathing, due to being in such pain.

## 2018-02-04 NOTE — Telephone Encounter (Signed)
Patient states she is having painin her lower abdomen because she is not moving her bowels. She is having diarrhea but no soft stool.  She is waiting on a call back from her PCP to be seen this week.

## 2018-02-13 ENCOUNTER — Ambulatory Visit: Payer: Self-pay

## 2018-02-13 NOTE — Telephone Encounter (Signed)
Patient called in with c/o "abdominal pain." Before she could be connected with NT, she hung the phone up. I called the patient back, left VM to return call to the office to speak to a TN. I called the office and spoke to [place], [facility] and advised the [facility] Agent said the patient did not want to speak to a nurse and was upset she was not able to schedule an appointment; she hung up before the call could be transferred to NT. I asked if she would call the patient, she verbalized understanding.

## 2018-02-13 NOTE — Telephone Encounter (Signed)
I called Sharon Rodriguez she stated that she wanted to make an appointment with dr.johnson this week, not seeing where I can put her in this week myself. I asked pt if she would be ok with seeing another provider pt stated that she did not. Please advise.

## 2018-02-14 NOTE — Telephone Encounter (Signed)
Called pt np answer

## 2018-02-14 NOTE — Telephone Encounter (Signed)
I can see her at 1 or 1:30 today to 1:45 tomorrow

## 2018-03-21 ENCOUNTER — Encounter: Payer: Medicare Other | Admitting: Student in an Organized Health Care Education/Training Program

## 2018-03-21 DIAGNOSIS — F331 Major depressive disorder, recurrent, moderate: Secondary | ICD-10-CM | POA: Diagnosis not present

## 2018-03-21 DIAGNOSIS — F411 Generalized anxiety disorder: Secondary | ICD-10-CM | POA: Diagnosis not present

## 2018-03-21 DIAGNOSIS — F900 Attention-deficit hyperactivity disorder, predominantly inattentive type: Secondary | ICD-10-CM | POA: Diagnosis not present

## 2018-03-27 ENCOUNTER — Other Ambulatory Visit: Payer: Self-pay | Admitting: Family Medicine

## 2018-03-27 ENCOUNTER — Telehealth: Payer: Self-pay | Admitting: Family Medicine

## 2018-03-27 NOTE — Telephone Encounter (Signed)
Called and spoke with patient. She states would like to have her thyroid checked for a refill on her medication.

## 2018-03-27 NOTE — Telephone Encounter (Signed)
Copied from [place] [id number]. Topic: General - Inquiry >> Mar 27, 2018 10:45 AM Windy Kalata wrote: Reason for CRM: patient called and states that she needs an order for labs, please advise Best call back is [phone]

## 2018-03-27 NOTE — Telephone Encounter (Signed)
Left message for patient to call back for appointment- needs TSH check- overdue. Courtesy refill given Requested Prescriptions  Pending Prescriptions Disp Refills  . SYNTHROID 50 MCG tablet [Pharmacy Med Name: SYNTHROID 50 MCG TAB] 90 tablet 0    Sig: TAKE 1 TABLET BY MOUTH ONCE DAILY BEFOREBREAKFAST     Endocrinology:  Hypothyroid Agents Failed - 03/27/2018 10:19 AM      Failed - TSH needs to be rechecked within 3 months after an abnormal result. Refill until TSH is due.      Failed - TSH in normal range and within 360 days    TSH  Date Value Ref Range Status  01/30/2017 3.960 0.450 - 4.500 uIU/mL Final         Passed - Valid encounter within last 12 months    Recent Outpatient Visits          3 months ago Other chronic pain   [place], Megan P, DO   7 months ago Other chronic pain   [place], [place], DO   8 months ago Other chronic pain   [place], [place], DO   9 months ago Other chronic pain   [employer], Megan P, DO   10 months ago Other chronic pain   [place], [place], DO

## 2018-03-27 NOTE — Telephone Encounter (Signed)
Thyroid medication was already sent in. Scheduled the patient an OMM apt for 04/23/18 per her request. Patient also requested a refill on vitamin D. States she has to have the prescription, states she has been taking it for about 5 years.

## 2018-03-27 NOTE — Telephone Encounter (Signed)
Hasn't been seen since September- please find out what labs she would like done, most likely needs an appointment.

## 2018-03-28 ENCOUNTER — Encounter: Payer: Self-pay | Admitting: Student in an Organized Health Care Education/Training Program

## 2018-03-28 ENCOUNTER — Ambulatory Visit
Payer: Medicare Other | Attending: Student in an Organized Health Care Education/Training Program | Admitting: Student in an Organized Health Care Education/Training Program

## 2018-03-28 VITALS — BP 120/97 | HR 96 | Temp 98.1°F | Resp 16 | Ht 61.0 in | Wt 205.0 lb

## 2018-03-28 DIAGNOSIS — M069 Rheumatoid arthritis, unspecified: Secondary | ICD-10-CM | POA: Diagnosis not present

## 2018-03-28 DIAGNOSIS — G894 Chronic pain syndrome: Secondary | ICD-10-CM | POA: Diagnosis not present

## 2018-03-28 DIAGNOSIS — M47816 Spondylosis without myelopathy or radiculopathy, lumbar region: Secondary | ICD-10-CM | POA: Diagnosis not present

## 2018-03-28 DIAGNOSIS — M7918 Myalgia, other site: Secondary | ICD-10-CM

## 2018-03-28 DIAGNOSIS — R5382 Chronic fatigue, unspecified: Secondary | ICD-10-CM | POA: Diagnosis not present

## 2018-03-28 DIAGNOSIS — M797 Fibromyalgia: Secondary | ICD-10-CM | POA: Diagnosis not present

## 2018-03-28 DIAGNOSIS — M26629 Arthralgia of temporomandibular joint, unspecified side: Secondary | ICD-10-CM

## 2018-03-28 MED ORDER — VITAMIN D (ERGOCALCIFEROL) 1.25 MG (50000 UNIT) PO CAPS: 50000.0000 [IU] | ORAL_CAPSULE | ORAL | 0 refills | Status: DC

## 2018-03-28 MED ORDER — OXYCODONE-ACETAMINOPHEN 5-325 MG PO TABS: 1.0000 | ORAL_TABLET | Freq: Four times a day (QID) | ORAL | 0 refills | Status: AC | PRN

## 2018-03-28 MED ORDER — OXYCODONE-ACETAMINOPHEN 5-325 MG PO TABS: 1.0000 | ORAL_TABLET | Freq: Four times a day (QID) | ORAL | 0 refills | Status: DC | PRN

## 2018-03-28 NOTE — Progress Notes (Signed)
Nursing Pain Medication Assessment:  Safety precautions to be maintained throughout the outpatient stay will include: orient to surroundings, keep bed in low position, maintain call bell within reach at all times, provide assistance with transfer out of bed and ambulation.  Medication Inspection Compliance: Pill count conducted under aseptic conditions, in front of the patient. Neither the pills nor the bottle was removed from the patient's sight at any time. Once count was completed pills were immediately returned to the patient in their original bottle.  Medication: Hydrocodone/APAP Pill/Patch Count: 21 of 120 pills remain Pill/Patch Appearance: Markings consistent with prescribed medication Bottle Appearance: Standard pharmacy container. Clearly labeled. Filled Date: 48 / 21 / 2019 Last Medication intake:  Today

## 2018-03-29 NOTE — Progress Notes (Signed)
Patient's Name: Sharon Rodriguez  MRN: [id number]  Referring Provider: Valerie Roys, DO  DOB: [birth year]  PCP: Valerie Roys, DO  DOS: 03/28/2018  Note by: Gillis Santa, MD  Service setting: Ambulatory outpatient  Specialty: Interventional Pain Management  Location: [facility] (AMB) Pain Management Facility    Patient type: Established   Primary Reason(s) for Visit: Encounter for prescription drug management. (Level of risk: moderate)  CC: Back Pain (lower bilateral;); Generalized Body Aches (fibromyalgia); and Pelvic Pain (vaginal area)  HPI  Sharon Rodriguez is a 38 y.o. year old, female patient, who comes today for a medication management evaluation. She has Hypothyroidism; Anxiety; Rheumatoid arthritis ([place]); Depression; Fibromyalgia; TMJ pain dysfunction syndrome; Chronic bronchitis ([place]); Fatigue; Chronic pain; ADD (attention deficit disorder); Learning disability; Chronic pelvic pain in female; Lower limb length difference; Vitamin D deficiency; Controlled substance agreement signed; Acne; Benign neoplasm of skin of trunk; H/O juvenile rheumatoid arthritis; Chronic pain disorder; Spondylosis without myelopathy or radiculopathy, lumbar region; Myofascial pain; Pelvic pain in female; Dysmenorrhea; Pubic bone pain; Chronic bilateral low back pain without sciatica; Chronic pain syndrome; Long term current use of opiate analgesic; and Gastroesophageal reflux disease on their problem list. Her primarily concern today is the Back Pain (lower bilateral;); Generalized Body Aches (fibromyalgia); and Pelvic Pain (vaginal area)  Pain Assessment: Location: Lower, Left, Right Back(fibromyalgia) Radiating: denies  Onset: More than a month ago Duration: Chronic pain Quality: Discomfort, Throbbing, Aching, Tingling, Numbness Severity: 7 /10 (subjective, self-reported pain score)  Note: Reported level is inconsistent with clinical observations.                         When using our objective Pain Scale, levels  between 6 and 10/10 are said to belong in an emergency room, as it progressively worsens from a 6/10, described as severely limiting, requiring emergency care not usually available at an outpatient pain management facility. At a 6/10 level, communication becomes difficult and requires great effort. Assistance to reach the emergency department may be required. Facial flushing and profuse sweating along with potentially dangerous increases in heart rate and blood pressure will be evident. Effect on ADL:   Timing: Intermittent Modifying factors: sitting down, heat or ice.  medications BP: (!) 120/97  HR: 96  Sharon Rodriguez was last scheduled for an appointment on 01/01/2018 for medication management. During today's appointment we reviewed Sharon Rodriguez's chronic pain status, as well as her outpatient medication regimen.  The patient  reports no history of drug use. Her body mass index is 38.73 kg/m.   Patient follows up today for medication management.  Since her last visit with me, patient has had a lap cholecystectomy.  After her cholecystectomy she did present to the hospital approximately 10 days later with worsening right upper quadrant abdominal pain on and was noted to be extremely constipated and was given an aggressive bowel regimen to be followed outpatient.  Further details on both, my assessment(s), as well as the proposed treatment plan, please see below.  Controlled Substance Pharmacotherapy Assessment REMS (Risk Evaluation and Mitigation Strategy)  Analgesic: Percocet 5 mg 4 times daily as needed, 120/month MME/day: 30 mg/day.   Sharon Billow, RN  03/28/2018 12:59 PM  Sign when Signing Visit Nursing Pain Medication Assessment:  Safety precautions to be maintained throughout the outpatient stay will include: orient to surroundings, keep bed in low position, maintain call bell within reach at all times, provide assistance with transfer out of bed and  ambulation.  Medication Inspection  Compliance: Pill count conducted under aseptic conditions, in front of the patient. Neither the pills nor the bottle was removed from the patient's sight at any time. Once count was completed pills were immediately returned to the patient in their original bottle.  Medication: Hydrocodone/APAP Pill/Patch Count: 21 of 120 pills remain Pill/Patch Appearance: Markings consistent with prescribed medication Bottle Appearance: Standard pharmacy container. Clearly labeled. Filled Date: 35 / 21 / 2019 Last Medication intake:  Today Pharmacokinetics: Liberation and absorption (onset of action): WNL Distribution (time to peak effect): WNL Metabolism and excretion (duration of action): WNL         Pharmacodynamics: Desired effects: Analgesia: [name] reports >50% benefit. Functional ability: Patient reports that medication allows her to accomplish basic ADLs Clinically meaningful improvement in function (CMIF): Sustained CMIF goals met Perceived effectiveness: Described as relatively effective, allowing for increase in activities of daily living (ADL) Undesirable effects: Side-effects or Adverse reactions: None reported Monitoring: [place] PMP: Online review of the past 83-monthperiod conducted. Compliant with practice rules and regulations Last UDS on record: Summary  Date Value Ref Range Status  01/01/2018 FINAL  Final    Comment:    ==================================================================== TOXASSURE SELECT 13 (MW) ==================================================================== Test                             Result       Flag       Units Drug Present and Declared for Prescription Verification   Oxycodone                      453          EXPECTED   ng/mg creat   Oxymorphone                    396          EXPECTED   ng/mg creat   Noroxycodone                   636          EXPECTED   ng/mg creat   Noroxymorphone                 97           EXPECTED   ng/mg creat    Sources  of oxycodone are scheduled prescription medications.    Oxymorphone, noroxycodone, and noroxymorphone are expected    metabolites of oxycodone. Oxymorphone is also available as a    scheduled prescription medication. ==================================================================== Test                      Result    Flag   Units      Ref Range   Creatinine              158              mg/dL      >=20 ==================================================================== Declared Medications:  The flagging and interpretation on this report are based on the  following declared medications.  Unexpected results may arise from  inaccuracies in the declared medications.  **Note: The testing scope of this panel includes these medications:  Oxycodone  **Note: The testing scope of this panel does not include following  reported medications:  Albuterol  Ibuprofen  Levothyroxine  Multivitamin  Omeprazole  Promethazine  Vitamin D2 (Ergocalciferol) ==================================================================== For  clinical consultation, please call [phone]. ====================================================================    UDS interpretation: Compliant          Medication Assessment Form: Reviewed. Patient indicates being compliant with therapy Treatment compliance: Compliant Risk Assessment Profile: Aberrant behavior: See prior evaluations. None observed or detected today Comorbid factors increasing risk of overdose: See prior notes. No additional risks detected today Opioid risk tool (ORT) (Total Score): 4 Personal History of Substance Abuse (SUD-Substance use disorder):  Alcohol: Negative  Illegal Drugs: Negative  Rx Drugs: Negative  ORT Risk Level calculation: Moderate Risk Risk of substance use disorder (SUD): Low Opioid Risk Tool - 03/28/18 1256      Family History of Substance Abuse   Alcohol  Negative    Illegal Drugs  Negative    Rx Drugs  Negative       Personal History of Substance Abuse   Alcohol  Negative    Illegal Drugs  Negative    Rx Drugs  Negative      Age   Age between 2-45 years   Yes      Psychological Disease   Psychological Disease  Positive    ADD  Negative    OCD  Negative    Bipolar  Negative    Schizophrenia  Negative    Depression  Positive      Total Score   Opioid Risk Tool Scoring  4    Opioid Risk Interpretation  Moderate Risk      ORT Scoring interpretation table:  Score <3 = Low Risk for SUD  Score between 4-7 = Moderate Risk for SUD  Score >8 = High Risk for Opioid Abuse   Risk Mitigation Strategies:  Patient Counseling: Covered Patient-Prescriber Agreement (PPA): Present and active  Notification to other healthcare providers: Done  Pharmacologic Plan: No change in therapy, at this time.             Laboratory Chemistry  Inflammation Markers (CRP: Acute Phase) (ESR: Chronic Phase) No results found for: CRP, ESRSEDRATE, LATICACIDVEN                       Rheumatology Markers Lab Results  Component Value Date   LYMEIGGIGMAB <0.91 07/06/2016   LYMEABIGMQN <0.80 07/06/2016                        Renal Function Markers Lab Results  Component Value Date   BUN 14 03/20/2013   CREATININE 0.86 03/20/2013   GFRAA >60 03/20/2013   GFRNONAA >60 03/20/2013                             Hepatic Function Markers Lab Results  Component Value Date   AST 48 (H) 04/10/2017   ALT 28 04/10/2017   ALBUMIN 4.2 04/10/2017   ALKPHOS 63 04/10/2017   LIPASE 43 04/10/2017                        Electrolytes Lab Results  Component Value Date   NA 137 03/20/2013   K 3.6 03/20/2013   CL 103 03/20/2013   CALCIUM 8.9 03/20/2013                        Neuropathy Markers No results found for: VITAMINB12, FOLATE, HGBA1C, HIV  CNS Tests No results found for: COLORCSF, APPEARCSF, RBCCOUNTCSF, WBCCSF, POLYSCSF, LYMPHSCSF, EOSCSF, PROTEINCSF, GLUCCSF, JCVIRUS, CSFOLI, IGGCSF                       Bone Pathology Markers Lab Results  Component Value Date   VD25OH 18.4 (L) 10/03/2016                         Coagulation Parameters Lab Results  Component Value Date   PLT 272 01/30/2017                        Cardiovascular Markers Lab Results  Component Value Date   TROPONINI < 0.02 03/20/2013   HGB 12.4 01/30/2017   HCT 37.7 01/30/2017                         CA Markers No results found for: CEA, CA125, LABCA2                      Note: Lab results reviewed.  Recent Diagnostic Imaging Results  NM Hepato W/EjeCT Fract CLINICAL DATA:  Upper abdominal pain  EXAM: NUCLEAR MEDICINE HEPATOBILIARY IMAGING WITH GALLBLADDER EF  VIEWS: Anterior right upper quadrant.  RADIOPHARMACEUTICALS:  5.265 mCi Tc-33m Choletec IV  COMPARISON:  None.  FINDINGS: Liver uptake of radiotracer is unremarkable. There is prompt visualization of gallbladder and small bowel, indicating patency of the cystic and common bile ducts. The patient consumed 8 ounces of Ensure orally with calculation of the computer generated ejection fraction of radiotracer from the gallbladder. The patient did experience abdominal pain with the oral Ensure consumption. The computer generated ejection fraction of radiotracer from the gallbladder is toward the lower limits of normal at 38%, normal greater than 33% using the oral agent.  IMPRESSION: Ejection fraction of radiotracer from the gallbladder is within normal limits at 38%, although this value is toward the lower limits of normal, normal greater than 33% using the oral agent. The patient did experience abdominal pain with the oral Ensure consumption. Cystic and common bile ducts are patent as is evidenced by visualization of gallbladder and small bowel.  Electronically Signed   By: WLowella GripIII M.D.   On: 12/14/2017 13:30  Complexity Note: Imaging results reviewed. Results shared with Ms. WAlcaraz using Layman's terms.                          Meds   Current Outpatient Medications:  .  albuterol (PROVENTIL HFA;VENTOLIN HFA) 108 (90 Base) MCG/ACT inhaler, Inhale 1 puff into the lungs every 6 (six) hours as needed for wheezing or shortness of breath., Disp: , Rfl:  .  albuterol (PROVENTIL) (2.5 MG/3ML) 0.083% nebulizer solution, Frequency:Q4HPRN   Dosage:0.0     Instructions:  Note:Dose: 1  Dx:J42 (Patient taking differently: Take 2.5 mg by nebulization every 4 (four) hours as needed for wheezing or shortness of breath. ), Disp: 75 mL, Rfl: 12 .  Multiple Vitamins-Minerals (MULTIVITAMIN PO), Take 1 tablet by mouth daily., Disp: , Rfl:  .  [START ON 04/01/2018] oxyCODONE-acetaminophen (PERCOCET/ROXICET) 5-325 MG tablet, Take 1 tablet by mouth every 6 (six) hours as needed for up to 30 days., Disp: 120 tablet, Rfl: 0 .  SYNTHROID 50 MCG tablet, TAKE 1 TABLET BY MOUTH ONCE DAILY BEFOREBREAKFAST, Disp: 90 tablet, Rfl: 0 .  Vitamin  D, Ergocalciferol, (DRISDOL) 1.25 MG (50000 UT) CAPS capsule, Take 1 capsule (50,000 Units total) by mouth every 7 (seven) days., Disp: 16 capsule, Rfl: 0 .  amphetamine-dextroamphetamine (ADDERALL) 30 MG tablet, Take 1 tablet by mouth 2 (two) times daily., Disp: , Rfl:  .  [START ON 05/01/2018] oxyCODONE-acetaminophen (PERCOCET) 5-325 MG tablet, Take 1 tablet by mouth every 6 (six) hours as needed for up to 30 days for severe pain., Disp: 120 tablet, Rfl: 0  ROS  Constitutional: Denies any fever or chills Gastrointestinal: No reported hemesis, hematochezia, vomiting, or acute GI distress Musculoskeletal: Denies any acute onset joint swelling, redness, loss of ROM, or weakness Neurological: No reported episodes of acute onset apraxia, aphasia, dysarthria, agnosia, amnesia, paralysis, loss of coordination, or loss of consciousness  Allergies  Ms. Shiffman is allergic to ibuprofen and promethazine.  PFSH  Drug: Ms. Croy  reports no history of drug use. Alcohol:  reports no history of alcohol  use. Tobacco:  reports that she has never smoked. She has never used smokeless tobacco. Medical:  has a past medical history of ADD (attention deficit disorder), Anxiety, Asthma, Biliary dyskinesia (12/27/2017), Chronic bronchitis ([place]), Chronic fatigue, Chronic pain, Depression, Fibromyalgia, GERD (gastroesophageal reflux disease), History of stomach ulcers (2019), Hypothyroidism (01/27/2015), Learning disability, Osteoarthritis, Rheumatoid arthritis ([place]), Thyroid disease, and TMJ (dislocation of temporomandibular joint). Surgical: Ms. Sarratt  has a past surgical history that includes Cesarean section; Esophagogastroduodenoscopy (egd) with propofol (N/A, 12/07/2017); and Cholecystectomy (N/A, 01/14/2018). Family: family history includes ADD / ADHD in her daughter and mother; Allergies in her daughter and daughter; Arthritis in her brother and paternal grandmother; COPD in her mother; Cancer in her maternal grandmother; Dementia in her paternal grandmother; Diabetes in her father and mother; Heart attack in her maternal grandfather; Heart disease in her maternal grandfather; Hyperlipidemia in her father; Learning disabilities in her mother; Mental illness in her daughter, father, and sister; Stroke in her maternal grandfather; Thyroid disease in her daughter.  Constitutional Exam  General appearance: Well nourished, well developed, and well hydrated. In no apparent acute distress Vitals:   03/28/18 1249  BP: (!) 120/97  Pulse: 96  Resp: 16  Temp: 98.1 F (36.7 C)  TempSrc: Oral  SpO2: 97%  Weight: 205 lb (93 kg)  Height: 5' 1"  (1.549 m)   BMI Assessment: Estimated body mass index is 38.73 kg/m as calculated from the following:   Height as of this encounter: 5' 1"  (1.549 m).   Weight as of this encounter: 205 lb (93 kg).  BMI interpretation table: BMI level Category Range association with higher incidence of chronic pain  <18 kg/m2 Underweight   18.5-24.9 kg/m2 Ideal body weight   25-29.9  kg/m2 Overweight Increased incidence by 20%  30-34.9 kg/m2 Obese (Class I) Increased incidence by 68%  35-39.9 kg/m2 Severe obesity (Class II) Increased incidence by 136%  >40 kg/m2 Extreme obesity (Class III) Increased incidence by 254%   Patient's current BMI Ideal Body weight  Body mass index is 38.73 kg/m. Ideal body weight: 47.8 kg (105 lb 6.1 oz) Adjusted ideal body weight: 65.9 kg (145 lb 3.6 oz)   BMI Readings from Last 4 Encounters:  03/28/18 38.73 kg/m  01/24/18 37.79 kg/m  01/14/18 37.79 kg/m  01/01/18 37.79 kg/m   Wt Readings from Last 4 Encounters:  03/28/18 205 lb (93 kg)  01/24/18 200 lb (90.7 kg)  01/14/18 200 lb (90.7 kg)  01/01/18 200 lb (90.7 kg)  Psych/Mental status: Alert, oriented x 3 (person, place, &  time)       Eyes: PERLA Respiratory: No evidence of acute respiratory distress  Cervical Spine Area Exam  Skin & Axial Inspection: No masses, redness, edema, swelling, or associated skin lesions Alignment: Symmetrical Functional ROM: Unrestricted ROM      Stability: No instability detected Muscle Tone/Strength: Functionally intact. No obvious neuro-muscular anomalies detected. Sensory (Neurological): Musculoskeletal pain pattern Palpation: No palpable anomalies              Upper Extremity (UE) Exam    Side: Right upper extremity  Side: Left upper extremity  Skin & Extremity Inspection: Skin color, temperature, and hair growth are WNL. No peripheral edema or cyanosis. No masses, redness, swelling, asymmetry, or associated skin lesions. No contractures.  Skin & Extremity Inspection: Skin color, temperature, and hair growth are WNL. No peripheral edema or cyanosis. No masses, redness, swelling, asymmetry, or associated skin lesions. No contractures.  Functional ROM: Unrestricted ROM          Functional ROM: Unrestricted ROM          Muscle Tone/Strength: Functionally intact. No obvious neuro-muscular anomalies detected.  Muscle Tone/Strength: Functionally  intact. No obvious neuro-muscular anomalies detected.  Sensory (Neurological): Musculoskeletal pain pattern          Sensory (Neurological): Musculoskeletal pain pattern          Palpation: No palpable anomalies              Palpation: No palpable anomalies              Provocative Test(s):  Phalen's test: deferred Tinel's test: deferred Apley's scratch test (touch opposite shoulder):  Action 1 (Across chest): deferred Action 2 (Overhead): deferred Action 3 (LB reach): deferred   Provocative Test(s):  Phalen's test: deferred Tinel's test: deferred Apley's scratch test (touch opposite shoulder):  Action 1 (Across chest): deferred Action 2 (Overhead): deferred Action 3 (LB reach): deferred    Thoracic Spine Area Exam  Skin & Axial Inspection: No masses, redness, or swelling Alignment: Symmetrical Functional ROM: Unrestricted ROM Stability: No instability detected Muscle Tone/Strength: Functionally intact. No obvious neuro-muscular anomalies detected. Sensory (Neurological): Unimpaired Muscle strength & Tone: No palpable anomalies  Lumbar Spine Area Exam  Skin & Axial Inspection: No masses, redness, or swelling Alignment: Symmetrical Functional ROM: Decreased ROM       Stability: No instability detected Muscle Tone/Strength: Functionally intact. No obvious neuro-muscular anomalies detected. Sensory (Neurological): Musculoskeletal pain pattern Palpation: No palpable anomalies       Provocative Tests: Hyperextension/rotation test: deferred today       Lumbar quadrant test (Kemp's test): deferred today       Lateral bending test: deferred today       Patrick's Maneuver: deferred today                   FABER* test: deferred today                   S-I anterior distraction/compression test: deferred today         S-I lateral compression test: deferred today         S-I Thigh-thrust test: deferred today         S-I Gaenslen's test: deferred today         *(Flexion, ABduction and  External Rotation)  Gait & Posture Assessment  Ambulation: Unassisted Gait: Relatively normal for age and body habitus Posture: WNL   Lower Extremity Exam    Side: Right lower extremity  Side:  Left lower extremity  Stability: No instability observed          Stability: No instability observed          Skin & Extremity Inspection: Skin color, temperature, and hair growth are WNL. No peripheral edema or cyanosis. No masses, redness, swelling, asymmetry, or associated skin lesions. No contractures.  Skin & Extremity Inspection: Skin color, temperature, and hair growth are WNL. No peripheral edema or cyanosis. No masses, redness, swelling, asymmetry, or associated skin lesions. No contractures.  Functional ROM: Unrestricted ROM                  Functional ROM: Unrestricted ROM                  Muscle Tone/Strength: Functionally intact. No obvious neuro-muscular anomalies detected.  Muscle Tone/Strength: Functionally intact. No obvious neuro-muscular anomalies detected.  Sensory (Neurological): Musculoskeletal pain pattern        Sensory (Neurological): Musculoskeletal pain pattern        DTR: Patellar: deferred today Achilles: deferred today Plantar: deferred today  DTR: Patellar: deferred today Achilles: deferred today Plantar: deferred today  Palpation: No palpable anomalies  Palpation: No palpable anomalies   Assessment  Primary Diagnosis & Pertinent Problem List: The primary encounter diagnosis was Chronic pain syndrome. Diagnoses of Spondylosis without myelopathy or radiculopathy, lumbar region, Myofascial pain, Rheumatoid arthritis, involving unspecified site, unspecified rheumatoid factor presence ([place]), Chronic fatigue, TMJ pain dysfunction syndrome, and Fibromyalgia were also pertinent to this visit.  Status Diagnosis  Controlled Controlled Controlled 1. Chronic pain syndrome   2. Spondylosis without myelopathy or radiculopathy, lumbar region   3. Myofascial pain   4.  Rheumatoid arthritis, involving unspecified site, unspecified rheumatoid factor presence ([place])   5. Chronic fatigue   6. TMJ pain dysfunction syndrome   7. Fibromyalgia      Patient follows up today for medication management.  Aside from a lap cholecystectomy that she had done, no significant changes in her medical history.  Has been compliant with therapy.  [place] PMP checked and appropriate.  UDS up-to-date and appropriate.  Refill of medications for 2 months below.  Plan of Care  Pharmacotherapy (Medications Ordered): Meds ordered this encounter  Medications  . oxyCODONE-acetaminophen (PERCOCET/ROXICET) 5-325 MG tablet    Sig: Take 1 tablet by mouth every 6 (six) hours as needed for up to 30 days.    Dispense:  120 tablet    Refill:  0  . oxyCODONE-acetaminophen (PERCOCET) 5-325 MG tablet    Sig: Take 1 tablet by mouth every 6 (six) hours as needed for up to 30 days for severe pain.    Dispense:  120 tablet    Refill:  0    Do not place this medication, or any other prescription from our practice, on "Automatic Refill". Patient may have prescription filled one day early if pharmacy is closed on scheduled refill date.     Provider-requested follow-up: Return in about 8 weeks (around 05/23/2018) for Medication Management.  Future Appointments  Date Time Provider [place]  04/23/2018  1:00 PM Valerie Roys, DO [facility]  05/28/2018 12:00 PM Gillis Santa, MD [facility] None    Primary Care Physician: Valerie Roys, DO Location: [facility] Note by: Gillis Santa, M.D Date: 03/28/2018; Time: 7:48 AM  There are no Patient Instructions on file for this visit.

## 2018-04-01 ENCOUNTER — Telehealth: Payer: Self-pay | Admitting: *Deleted

## 2018-04-01 NOTE — Telephone Encounter (Signed)
The pharmacy does not have any record of getting her prescription for her pain meds. The pharmacy is [facility] in [place]. Please check to make sure it was sent to the right pharmacy.

## 2018-04-01 NOTE — Telephone Encounter (Signed)
Called to [facility] to see if they had Rx for pain medication.  They do have both of the Rx's that were written on March 28, 2018 and he is processing the one that is due to fill today.  I will let the patient know that they are ready.

## 2018-04-23 ENCOUNTER — Ambulatory Visit: Payer: Medicaid Other | Admitting: Family Medicine

## 2018-05-23 ENCOUNTER — Telehealth: Payer: Self-pay | Admitting: Student in an Organized Health Care Education/Training Program

## 2018-05-23 NOTE — Telephone Encounter (Signed)
Called patient to let her know that she will need to keep her medication appt.

## 2018-05-23 NOTE — Telephone Encounter (Signed)
Patient has med refill appt. Scheduled next week. She is afraid to come out for appt. Due to state of emergency called by government. She wants to know if her meds can be called in for a month and her appt. Rescheduled. Please call patient.

## 2018-05-28 ENCOUNTER — Other Ambulatory Visit: Payer: Self-pay

## 2018-05-28 ENCOUNTER — Encounter: Payer: Self-pay | Admitting: Student in an Organized Health Care Education/Training Program

## 2018-05-28 ENCOUNTER — Ambulatory Visit: Payer: Medicare Other | Admitting: Family Medicine

## 2018-05-28 ENCOUNTER — Ambulatory Visit
Payer: Medicare Other | Attending: Student in an Organized Health Care Education/Training Program | Admitting: Student in an Organized Health Care Education/Training Program

## 2018-05-28 VITALS — BP 143/94 | Resp 16 | Ht 61.0 in | Wt 205.0 lb

## 2018-05-28 DIAGNOSIS — M26629 Arthralgia of temporomandibular joint, unspecified side: Secondary | ICD-10-CM | POA: Diagnosis not present

## 2018-05-28 DIAGNOSIS — M069 Rheumatoid arthritis, unspecified: Secondary | ICD-10-CM | POA: Diagnosis not present

## 2018-05-28 DIAGNOSIS — G8929 Other chronic pain: Secondary | ICD-10-CM | POA: Insufficient documentation

## 2018-05-28 DIAGNOSIS — M7918 Myalgia, other site: Secondary | ICD-10-CM | POA: Insufficient documentation

## 2018-05-28 DIAGNOSIS — M797 Fibromyalgia: Secondary | ICD-10-CM | POA: Insufficient documentation

## 2018-05-28 DIAGNOSIS — F339 Major depressive disorder, recurrent, unspecified: Secondary | ICD-10-CM | POA: Diagnosis not present

## 2018-05-28 DIAGNOSIS — M545 Low back pain: Secondary | ICD-10-CM | POA: Insufficient documentation

## 2018-05-28 DIAGNOSIS — G894 Chronic pain syndrome: Secondary | ICD-10-CM | POA: Insufficient documentation

## 2018-05-28 DIAGNOSIS — R5382 Chronic fatigue, unspecified: Secondary | ICD-10-CM | POA: Diagnosis not present

## 2018-05-28 DIAGNOSIS — M47816 Spondylosis without myelopathy or radiculopathy, lumbar region: Secondary | ICD-10-CM | POA: Diagnosis not present

## 2018-05-28 DIAGNOSIS — Z79891 Long term (current) use of opiate analgesic: Secondary | ICD-10-CM | POA: Diagnosis not present

## 2018-05-28 DIAGNOSIS — Z79899 Other long term (current) drug therapy: Secondary | ICD-10-CM | POA: Diagnosis not present

## 2018-05-28 MED ORDER — OXYCODONE-ACETAMINOPHEN 5-325 MG PO TABS: 1.0000 | ORAL_TABLET | Freq: Four times a day (QID) | ORAL | 0 refills | Status: DC | PRN

## 2018-05-28 MED ORDER — OXYCODONE-ACETAMINOPHEN 5-325 MG PO TABS: 1.0000 | ORAL_TABLET | Freq: Four times a day (QID) | ORAL | 0 refills | Status: AC | PRN

## 2018-05-28 NOTE — Progress Notes (Signed)
Patient's Name: Sharon Rodriguez  MRN: [id number]  Referring Provider: Valerie Roys, DO  DOB: 1980/06/05  PCP: Valerie Roys, DO  DOS: 05/28/2018  Note by: Gillis Santa, MD  Service setting: Ambulatory outpatient  Specialty: Interventional Pain Management  Location: [facility] (AMB) Pain Management Facility    Patient type: Established   Primary Reason(s) for Visit: Encounter for prescription drug management. (Level of risk: moderate)  CC: Back Pain (lower)  HPI  Sharon Rodriguez is a 38 y.o. year old, female patient, who comes today for a medication management evaluation. She has Hypothyroidism; Anxiety; Rheumatoid arthritis ([place]); Depression; Fibromyalgia; TMJ pain dysfunction syndrome; Chronic bronchitis ([employer]); Fatigue; Chronic pain; ADD (attention deficit disorder); Learning disability; Chronic pelvic pain in female; Lower limb length difference; Vitamin D deficiency; Controlled substance agreement signed; Acne; Benign neoplasm of skin of trunk; H/O juvenile rheumatoid arthritis; Chronic pain disorder; Spondylosis without myelopathy or radiculopathy, lumbar region; Myofascial pain; Pelvic pain in female; Dysmenorrhea; Pubic bone pain; Chronic bilateral low back pain without sciatica; Chronic pain syndrome; Long term current use of opiate analgesic; and Gastroesophageal reflux disease on their problem list. Her primarily concern today is the Back Pain (lower)  Pain Assessment: Location: Lower Back Radiating: denies Onset: More than a month ago Duration: Chronic pain Quality: Sharp, Throbbing, Aching Severity: 5 /10 (subjective, self-reported pain score)  Note: Reported level is inconsistent with clinical observations.                         When using our objective Pain Scale, levels between 6 and 10/10 are said to belong in an emergency room, as it progressively worsens from a 6/10, described as severely limiting, requiring emergency care not usually available at an outpatient pain management  facility. At a 6/10 level, communication becomes difficult and requires great effort. Assistance to reach the emergency department may be required. Facial flushing and profuse sweating along with potentially dangerous increases in heart rate and blood pressure will be evident. Effect on ADL:   Timing: Constant Modifying factors: sitting, ice, medications, heat BP: (!) 143/94  HR: (patient refused)  Sharon Rodriguez was last scheduled for an appointment on 05/23/2018 for medication management. During today's appointment we reviewed Sharon Rodriguez's chronic pain status, as well as her outpatient medication regimen.  Follows up today for medication management.  No changes in medical history.  Is very anxious and nervous about this recent coronavirus pandemic.  The patient  reports no history of drug use. Her body mass index is 38.73 kg/m.  Further details on both, my assessment(s), as well as the proposed treatment plan, please see below.  Controlled Substance Pharmacotherapy Assessment REMS (Risk Evaluation and Mitigation Strategy)   05/01/2018  1   03/28/2018  Oxycodone-Acetaminophen 5-325  120.00 30 Bi Lat   [id number]   256 (3732)   0  30.00 MME  Comm Ins   [place]    Landis Martins, RN  05/28/2018 12:03 PM  Sign when Signing Visit Nursing Pain Medication Assessment:  Safety precautions to be maintained throughout the outpatient stay will include: orient to surroundings, keep bed in low position, maintain call bell within reach at all times, provide assistance with transfer out of bed and ambulation.  Medication Inspection Compliance: Pill count conducted under aseptic conditions, in front of the patient. Neither the pills nor the bottle was removed from the patient's sight at any time. Once count was completed pills were immediately returned to the patient in  their original bottle.  Medication: Oxycodone/APAP Pill/Patch Count: 9 of 120 pills remain Pill/Patch Appearance: Markings consistent with  prescribed medication Bottle Appearance: Standard pharmacy container. Clearly labeled. Filled Date: 02/19 / 2020 Last Medication intake:  Today   Pharmacokinetics: Liberation and absorption (onset of action): WNL Distribution (time to peak effect): WNL Metabolism and excretion (duration of action): WNL         Pharmacodynamics: Desired effects: Analgesia: Sharon Rodriguez reports >50% benefit. Functional ability: Patient reports that medication allows her to accomplish basic ADLs Clinically meaningful improvement in function (CMIF): Sustained CMIF goals met Perceived effectiveness: Described as relatively effective, allowing for increase in activities of daily living (ADL) Undesirable effects: Side-effects or Adverse reactions: None reported Monitoring: [place] PMP: Online review of the past 72-monthperiod conducted. Compliant with practice rules and regulations Last UDS on record: Summary  Date Value Ref Range Status  01/01/2018 FINAL  Final    Comment:    ==================================================================== TOXASSURE SELECT 13 (MW) ==================================================================== Test                             Result       Flag       Units Drug Present and Declared for Prescription Verification   Oxycodone                      453          EXPECTED   ng/mg creat   Oxymorphone                    396          EXPECTED   ng/mg creat   Noroxycodone                   636          EXPECTED   ng/mg creat   Noroxymorphone                 97           EXPECTED   ng/mg creat    Sources of oxycodone are scheduled prescription medications.    Oxymorphone, noroxycodone, and noroxymorphone are expected    metabolites of oxycodone. Oxymorphone is also available as a    scheduled prescription medication. ==================================================================== Test                      Result    Flag   Units      Ref Range   Creatinine              158               mg/dL      >=20 ==================================================================== Declared Medications:  The flagging and interpretation on this report are based on the  following declared medications.  Unexpected results may arise from  inaccuracies in the declared medications.  **Note: The testing scope of this panel includes these medications:  Oxycodone  **Note: The testing scope of this panel does not include following  reported medications:  Albuterol  Ibuprofen  Levothyroxine  Multivitamin  Omeprazole  Promethazine  Vitamin D2 (Ergocalciferol) ==================================================================== For clinical consultation, please call ([phone] ====================================================================    UDS interpretation: Compliant          Medication Assessment Form: Reviewed. Patient indicates being compliant with therapy Treatment compliance: Compliant Risk Assessment Profile: Aberrant behavior: See initial evaluations.  None observed or detected today Comorbid factors increasing risk of overdose: See initial evaluation. No additional risks detected today Opioid risk tool (ORT):  Opioid Risk  03/28/2018  Alcohol 0  Illegal Drugs 0  Rx Drugs 0  Alcohol 0  Illegal Drugs 0  Rx Drugs 0  Age between 16-45 years  1  History of Preadolescent Sexual Abuse -  Psychological Disease 2  ADD Negative  OCD Negative  Bipolar Negative  Depression 1  Opioid Risk Tool Scoring 4  Opioid Risk Interpretation Moderate Risk    ORT Scoring interpretation table:  Score <3 = Low Risk for SUD  Score between 4-7 = Moderate Risk for SUD  Score >8 = High Risk for Opioid Abuse   Risk of substance use disorder (SUD): Low  Risk Mitigation Strategies:  Patient Counseling: Covered Patient-Prescriber Agreement (PPA): Present and active  Notification to other healthcare providers: Done  Pharmacologic Plan: No change in therapy, at this  time.             Laboratory Chemistry  Inflammation Markers (CRP: Acute Phase) (ESR: Chronic Phase) No results found for: CRP, ESRSEDRATE, LATICACIDVEN                       Rheumatology Markers Lab Results  Component Value Date   LYMEIGGIGMAB <0.91 07/06/2016   LYMEABIGMQN <0.80 07/06/2016                        Renal Function Markers Lab Results  Component Value Date   BUN 14 03/20/2013   CREATININE 0.86 03/20/2013   GFRAA >60 03/20/2013   GFRNONAA >60 03/20/2013                             Hepatic Function Markers Lab Results  Component Value Date   AST 48 (H) 04/10/2017   ALT 28 04/10/2017   ALBUMIN 4.2 04/10/2017   ALKPHOS 63 04/10/2017   LIPASE 43 04/10/2017                        Electrolytes Lab Results  Component Value Date   NA 137 03/20/2013   K 3.6 03/20/2013   CL 103 03/20/2013   CALCIUM 8.9 03/20/2013                        Neuropathy Markers No results found for: VITAMINB12, FOLATE, HGBA1C, HIV                      CNS Tests No results found for: COLORCSF, APPEARCSF, RBCCOUNTCSF, WBCCSF, POLYSCSF, LYMPHSCSF, EOSCSF, PROTEINCSF, GLUCCSF, JCVIRUS, CSFOLI, IGGCSF                      Bone Pathology Markers Lab Results  Component Value Date   VD25OH 18.4 (L) 10/03/2016                         Coagulation Parameters Lab Results  Component Value Date   PLT 272 01/30/2017                        Cardiovascular Markers Lab Results  Component Value Date   TROPONINI < 0.02 03/20/2013   HGB 12.4 01/30/2017   HCT 37.7 01/30/2017  CA Markers No results found for: CEA, CA125, LABCA2                      Endocrine Markers Lab Results  Component Value Date   TSH 3.960 01/30/2017                        Note: Lab results reviewed.  Recent Diagnostic Imaging Results  NM Hepato W/EjeCT Fract CLINICAL DATA:  Upper abdominal pain  EXAM: NUCLEAR MEDICINE HEPATOBILIARY IMAGING WITH GALLBLADDER EF  VIEWS: Anterior  right upper quadrant.  RADIOPHARMACEUTICALS:  5.265 mCi Tc-81m Choletec IV  COMPARISON:  None.  FINDINGS: Liver uptake of radiotracer is unremarkable. There is prompt visualization of gallbladder and small bowel, indicating patency of the cystic and common bile ducts. The patient consumed 8 ounces of Ensure orally with calculation of the computer generated ejection fraction of radiotracer from the gallbladder. The patient did experience abdominal pain with the oral Ensure consumption. The computer generated ejection fraction of radiotracer from the gallbladder is toward the lower limits of normal at 38%, normal greater than 33% using the oral agent.  IMPRESSION: Ejection fraction of radiotracer from the gallbladder is within normal limits at 38%, although this value is toward the lower limits of normal, normal greater than 33% using the oral agent. The patient did experience abdominal pain with the oral Ensure consumption. Cystic and common bile ducts are patent as is evidenced by visualization of gallbladder and small bowel.  Electronically Signed   By: WLowella GripIII M.D.   On: 12/14/2017 13:30  Complexity Note: Imaging results reviewed. Results shared with Ms. WDorsainvil using Layman's terms.                               Meds   Current Outpatient Medications:  .  albuterol (PROVENTIL HFA;VENTOLIN HFA) 108 (90 Base) MCG/ACT inhaler, Inhale 1 puff into the lungs every 6 (six) hours as needed for wheezing or shortness of breath., Disp: , Rfl:  .  albuterol (PROVENTIL) (2.5 MG/3ML) 0.083% nebulizer solution, Frequency:Q4HPRN   Dosage:0.0     Instructions:  Note:Dose: 1  Dx:J42 (Patient taking differently: Take 2.5 mg by nebulization every 4 (four) hours as needed for wheezing or shortness of breath. ), Disp: 75 mL, Rfl: 12 .  amphetamine-dextroamphetamine (ADDERALL) 30 MG tablet, Take 1 tablet by mouth 2 (two) times daily., Disp: , Rfl:  .  Multiple Vitamins-Minerals  (MULTIVITAMIN PO), Take 1 tablet by mouth daily., Disp: , Rfl:  .  [START ON 05/30/2018] oxyCODONE-acetaminophen (PERCOCET) 5-325 MG tablet, Take 1 tablet by mouth every 6 (six) hours as needed for up to 30 days for severe pain., Disp: 120 tablet, Rfl: 0 .  SYNTHROID 50 MCG tablet, TAKE 1 TABLET BY MOUTH ONCE DAILY BEFOREBREAKFAST, Disp: 90 tablet, Rfl: 0 .  Vitamin D, Ergocalciferol, (DRISDOL) 1.25 MG (50000 UT) CAPS capsule, Take 1 capsule (50,000 Units total) by mouth every 7 (seven) days., Disp: 16 capsule, Rfl: 0 .  [START ON 06/29/2018] oxyCODONE-acetaminophen (PERCOCET) 5-325 MG tablet, Take 1 tablet by mouth every 6 (six) hours as needed for up to 30 days for severe pain. For chronic pain, Disp: 120 tablet, Rfl: 0 .  [START ON 07/29/2018] oxyCODONE-acetaminophen (PERCOCET) 5-325 MG tablet, Take 1 tablet by mouth every 6 (six) hours as needed for up to 30 days for severe pain. Must last 30 days., Disp:  120 tablet, Rfl: 0  ROS  Constitutional: Denies any fever or chills Gastrointestinal: No reported hemesis, hematochezia, vomiting, or acute GI distress Musculoskeletal: Denies any acute onset joint swelling, redness, loss of ROM, or weakness Neurological: No reported episodes of acute onset apraxia, aphasia, dysarthria, agnosia, amnesia, paralysis, loss of coordination, or loss of consciousness  Allergies  Ms. Valido is allergic to ibuprofen and promethazine.  PFSH  Drug: Ms. Kantor  reports no history of drug use. Alcohol:  reports no history of alcohol use. Tobacco:  reports that she has never smoked. She has never used smokeless tobacco. Medical:  has a past medical history of ADD (attention deficit disorder), Anxiety, Asthma, Biliary dyskinesia (12/27/2017), Chronic bronchitis ([place]), Chronic fatigue, Chronic pain, Depression, Fibromyalgia, GERD (gastroesophageal reflux disease), History of stomach ulcers (2019), Hypothyroidism (01/27/2015), Learning disability, Osteoarthritis, Rheumatoid  arthritis ([place]), Thyroid disease, and TMJ (dislocation of temporomandibular joint). Surgical: Ms. Fray  has a past surgical history that includes Cesarean section; Esophagogastroduodenoscopy (egd) with propofol (N/A, 12/07/2017); and Cholecystectomy (N/A, 01/14/2018). Family: family history includes ADD / ADHD in her daughter and mother; Allergies in her daughter and daughter; Arthritis in her brother and paternal grandmother; COPD in her mother; Cancer in her maternal grandmother; Dementia in her paternal grandmother; Diabetes in her father and mother; Heart attack in her maternal grandfather; Heart disease in her maternal grandfather; Hyperlipidemia in her father; Learning disabilities in her mother; Mental illness in her daughter, father, and sister; Stroke in her maternal grandfather; Thyroid disease in her daughter.  Constitutional Exam  General appearance: Well nourished, well developed, and well hydrated. In no apparent acute distress Vitals:   05/28/18 1159  BP: (!) 143/94  Resp: 16  Weight: 205 lb (93 kg)  Height: 5' 1"  (1.549 m)   BMI Assessment: Estimated body mass index is 38.73 kg/m as calculated from the following:   Height as of this encounter: 5' 1"  (1.549 m).   Weight as of this encounter: 205 lb (93 kg).  BMI interpretation table: BMI level Category Range association with higher incidence of chronic pain  <18 kg/m2 Underweight   18.5-24.9 kg/m2 Ideal body weight   25-29.9 kg/m2 Overweight Increased incidence by 20%  30-34.9 kg/m2 Obese (Class I) Increased incidence by 68%  35-39.9 kg/m2 Severe obesity (Class II) Increased incidence by 136%  >40 kg/m2 Extreme obesity (Class III) Increased incidence by 254%   Patient's current BMI Ideal Body weight  Body mass index is 38.73 kg/m. Ideal body weight: 47.8 kg (105 lb 6.1 oz) Adjusted ideal body weight: 65.9 kg (145 lb 3.6 oz)   BMI Readings from Last 4 Encounters:  05/28/18 38.73 kg/m  03/28/18 38.73 kg/m  01/24/18  37.79 kg/m  01/14/18 37.79 kg/m   Wt Readings from Last 4 Encounters:  05/28/18 205 lb (93 kg)  03/28/18 205 lb (93 kg)  01/24/18 200 lb (90.7 kg)  01/14/18 200 lb (90.7 kg)  Psych/Mental status: Alert, oriented x 3 (person, place, & time)       Eyes: PERLA Respiratory: No evidence of acute respiratory distress  Cervical Spine Area Exam  Skin & Axial Inspection: No masses, redness, edema, swelling, or associated skin lesions Alignment: Symmetrical Functional ROM: Unrestricted ROM      Stability: No instability detected Muscle Tone/Strength: Functionally intact. No obvious neuro-muscular anomalies detected. Sensory (Neurological): Unimpaired Palpation: No palpable anomalies              Upper Extremity (UE) Exam    Side: Right upper extremity  Side: Left upper extremity  Skin & Extremity Inspection: Skin color, temperature, and hair growth are WNL. No peripheral edema or cyanosis. No masses, redness, swelling, asymmetry, or associated skin lesions. No contractures.  Skin & Extremity Inspection: Skin color, temperature, and hair growth are WNL. No peripheral edema or cyanosis. No masses, redness, swelling, asymmetry, or associated skin lesions. No contractures.  Functional ROM: Unrestricted ROM          Functional ROM: Unrestricted ROM          Muscle Tone/Strength: Functionally intact. No obvious neuro-muscular anomalies detected.  Muscle Tone/Strength: Functionally intact. No obvious neuro-muscular anomalies detected.  Sensory (Neurological): Musculoskeletal pain pattern          Sensory (Neurological): Musculoskeletal pain pattern          Palpation: No palpable anomalies              Palpation: No palpable anomalies              Provocative Test(s):  Phalen's test: deferred Tinel's test: deferred Apley's scratch test (touch opposite shoulder):  Action 1 (Across chest): deferred Action 2 (Overhead): deferred Action 3 (LB reach): deferred   Provocative Test(s):  Phalen's test:  deferred Tinel's test: deferred Apley's scratch test (touch opposite shoulder):  Action 1 (Across chest): deferred Action 2 (Overhead): deferred Action 3 (LB reach): deferred    Thoracic Spine Area Exam  Skin & Axial Inspection: No masses, redness, or swelling Alignment: Symmetrical Functional ROM: Unrestricted ROM Stability: No instability detected Muscle Tone/Strength: Functionally intact. No obvious neuro-muscular anomalies detected. Sensory (Neurological): Unimpaired Muscle strength & Tone: No palpable anomalies  Lumbar Spine Area Exam  Skin & Axial Inspection: No masses, redness, or swelling Alignment: Symmetrical Functional ROM: Unrestricted ROM       Stability: No instability detected Muscle Tone/Strength: Functionally intact. No obvious neuro-muscular anomalies detected. Sensory (Neurological): Musculoskeletal pain pattern Palpation: No palpable anomalies       Provocative Tests: Hyperextension/rotation test: deferred today       Lumbar quadrant test (Kemp's test): deferred today       Lateral bending test: deferred today       Patrick's Maneuver: deferred today                   FABER* test: deferred today                   S-I anterior distraction/compression test: deferred today         S-I lateral compression test: deferred today         S-I Thigh-thrust test: deferred today         S-I Gaenslen's test: deferred today         *(Flexion, ABduction and External Rotation)  Gait & Posture Assessment  Ambulation: Unassisted Gait: Relatively normal for age and body habitus Posture: WNL   Lower Extremity Exam    Side: Right lower extremity  Side: Left lower extremity  Stability: No instability observed          Stability: No instability observed          Skin & Extremity Inspection: Skin color, temperature, and hair growth are WNL. No peripheral edema or cyanosis. No masses, redness, swelling, asymmetry, or associated skin lesions. No contractures.  Skin & Extremity  Inspection: Skin color, temperature, and hair growth are WNL. No peripheral edema or cyanosis. No masses, redness, swelling, asymmetry, or associated skin lesions. No contractures.  Functional ROM: Unrestricted ROM  Functional ROM: Unrestricted ROM                  Muscle Tone/Strength: Functionally intact. No obvious neuro-muscular anomalies detected.  Muscle Tone/Strength: Functionally intact. No obvious neuro-muscular anomalies detected.  Sensory (Neurological): Musculoskeletal pain pattern        Sensory (Neurological): Musculoskeletal pain pattern        DTR: Patellar: deferred today Achilles: deferred today Plantar: deferred today  DTR: Patellar: deferred today Achilles: deferred today Plantar: deferred today  Palpation: No palpable anomalies  Palpation: No palpable anomalies   Assessment   Status Diagnosis  Controlled Controlled Controlled 1. Chronic pain syndrome   2. Spondylosis without myelopathy or radiculopathy, lumbar region   3. Myofascial pain   4. Rheumatoid arthritis, involving unspecified site, unspecified rheumatoid factor presence ([place])   5. Chronic fatigue   6. TMJ pain dysfunction syndrome   7. Fibromyalgia   8. Recurrent major depressive disorder, remission status unspecified ([place])   9. Controlled substance agreement signed   10. Chronic bilateral low back pain without sciatica   11. Long term current use of opiate analgesic     General Recommendations: The pain condition that the patient suffers from is best treated with a multidisciplinary approach that involves an increase in physical activity to prevent de-conditioning and worsening of the pain cycle, as well as psychological counseling (formal and/or informal) to address the co-morbid psychological affects of pain. Treatment will often involve judicious use of pain medications and interventional procedures to decrease the pain, allowing the patient to participate in the physical activity  that will ultimately produce long-lasting pain reductions. The goal of the multidisciplinary approach is to return the patient to a higher level of overall function and to restore their ability to perform activities of daily living.  Patient is here for medication refill.  No changes in medical history.  [place] PMP checked and appropriate.  UDS up-to-date.  Will provide refill for 3 months as below this patient is very concerned and anxious about the coronavirus and wants to minimize visits to the hospital at all cost.  I informed the patient that I can only give her a prescription for 3 months and that she would likely have to follow-up for medication refill at that point.  Plan of Care  Pharmacotherapy (Medications Ordered): Meds ordered this encounter  Medications  . oxyCODONE-acetaminophen (PERCOCET) 5-325 MG tablet    Sig: Take 1 tablet by mouth every 6 (six) hours as needed for up to 30 days for severe pain.    Dispense:  120 tablet    Refill:  0    Do not place this medication, or any other prescription from our practice, on "Automatic Refill". Patient may have prescription filled one day early if pharmacy is closed on scheduled refill date.  Marland Kitchen oxyCODONE-acetaminophen (PERCOCET) 5-325 MG tablet    Sig: Take 1 tablet by mouth every 6 (six) hours as needed for up to 30 days for severe pain. For chronic pain    Dispense:  120 tablet    Refill:  0  . oxyCODONE-acetaminophen (PERCOCET) 5-325 MG tablet    Sig: Take 1 tablet by mouth every 6 (six) hours as needed for up to 30 days for severe pain. Must last 30 days.    Dispense:  120 tablet    Refill:  0    [place] STOP ACT - Not applicable. Fill one day early if pharmacy is closed on scheduled refill date.   Provider-requested follow-up: Return  in about 3 months (around 08/28/2018) for Medication Management.  No future appointments.  Primary Care Physician: Valerie Roys, DO Location: [facility] Note by: Gillis Santa, M.D Date: 05/28/2018; Time: 12:27 PM  Patient Instructions  Oxycodone with acetaminophen to last until 08/28/2018 has been escribed to your pharmacy.

## 2018-05-28 NOTE — Patient Instructions (Signed)
Oxycodone with acetaminophen to last until 08/28/2018 has been escribed to your pharmacy.

## 2018-05-28 NOTE — Progress Notes (Signed)
Nursing Pain Medication Assessment:  Safety precautions to be maintained throughout the outpatient stay will include: orient to surroundings, keep bed in low position, maintain call bell within reach at all times, provide assistance with transfer out of bed and ambulation.  Medication Inspection Compliance: Pill count conducted under aseptic conditions, in front of the patient. Neither the pills nor the bottle was removed from the patient's sight at any time. Once count was completed pills were immediately returned to the patient in their original bottle.  Medication: Oxycodone/APAP Pill/Patch Count: 9 of 120 pills remain Pill/Patch Appearance: Markings consistent with prescribed medication Bottle Appearance: Standard pharmacy container. Clearly labeled. Filled Date: 02/19 / 2020 Last Medication intake:  Today

## 2018-05-29 ENCOUNTER — Encounter: Payer: Medicare Other | Admitting: Student in an Organized Health Care Education/Training Program

## 2018-06-24 ENCOUNTER — Telehealth: Payer: Self-pay

## 2018-06-24 NOTE — Telephone Encounter (Signed)
Attempted to reach pt to complete PHQ9 via the phone in order to close gaps. Will attempt again tomorrow.

## 2018-06-26 NOTE — Telephone Encounter (Signed)
Spoke w/ patient and PHQ9 was completed.

## 2018-07-30 ENCOUNTER — Telehealth: Payer: Self-pay

## 2018-07-30 NOTE — Telephone Encounter (Signed)
Copied from [place] [id number]. Topic: Referral - Status >> Jul 30, 2018 74:25 AM Sharon Rodriguez wrote: 9/[birth year]/3875 spoke with patient about community resources for medical and prescription co-pays.  Patient stated she did not need assistance at this time.MA

## 2018-08-20 ENCOUNTER — Encounter: Payer: Self-pay | Admitting: Student in an Organized Health Care Education/Training Program

## 2018-08-20 ENCOUNTER — Other Ambulatory Visit: Payer: Self-pay

## 2018-08-20 ENCOUNTER — Ambulatory Visit
Payer: Medicare Other | Attending: Student in an Organized Health Care Education/Training Program | Admitting: Student in an Organized Health Care Education/Training Program

## 2018-08-20 ENCOUNTER — Encounter: Payer: Medicare Other | Admitting: Student in an Organized Health Care Education/Training Program

## 2018-08-20 DIAGNOSIS — Z79891 Long term (current) use of opiate analgesic: Secondary | ICD-10-CM

## 2018-08-20 DIAGNOSIS — M47816 Spondylosis without myelopathy or radiculopathy, lumbar region: Secondary | ICD-10-CM | POA: Diagnosis not present

## 2018-08-20 DIAGNOSIS — M797 Fibromyalgia: Secondary | ICD-10-CM | POA: Diagnosis not present

## 2018-08-20 DIAGNOSIS — M069 Rheumatoid arthritis, unspecified: Secondary | ICD-10-CM | POA: Diagnosis not present

## 2018-08-20 DIAGNOSIS — G894 Chronic pain syndrome: Secondary | ICD-10-CM | POA: Diagnosis not present

## 2018-08-20 MED ORDER — OXYCODONE-ACETAMINOPHEN 5-325 MG PO TABS: 1.0000 | ORAL_TABLET | Freq: Four times a day (QID) | ORAL | 0 refills | Status: DC | PRN

## 2018-08-20 MED ORDER — OXYCODONE-ACETAMINOPHEN 5-325 MG PO TABS: 1.0000 | ORAL_TABLET | Freq: Four times a day (QID) | ORAL | 0 refills | Status: AC | PRN

## 2018-08-20 NOTE — Progress Notes (Signed)
Pain Management Virtual Encounter Note - Virtual Visit via Telephone Telehealth (real-time audio visits between healthcare provider and patient).   Patient's Phone No. & Preferred Pharmacy:  [phone] (home); There is no such number on file (mobile).; (Preferred) [phone] [email or web address]  [place], [place] [address] Phone: [phone] Fax: [phone]  [facility], [place] - [address] [place] [address] Phone: [phone] Fax: [phone]    Pre-screening note:  Our staff contacted Sharon Rodriguez and offered her an "in person", "face-to-face" appointment versus a telephone encounter. She indicated preferring the telephone encounter, at this time.   Reason for Virtual Visit: COVID-19*  Social distancing based on CDC and AMA recommendations.   I contacted Sharon Rodriguez on 08/20/2018 via telephone.      I clearly identified myself as Gillis Santa, MD. I verified that I was speaking with the correct person using two identifiers (Name: Sharon Rodriguez, and date of birth: [birth year]).  Advanced Informed Consent I sought verbal advanced consent from Sharon Rodriguez for virtual visit interactions. I informed Sharon Rodriguez of possible security and privacy concerns, risks, and limitations associated with providing "not-in-person" medical evaluation and management services. I also informed Sharon Rodriguez of the availability of "in-person" appointments. Finally, I informed her that there would be a charge for the virtual visit and that she could be  personally, fully or partially, financially responsible for it. Sharon Rodriguez expressed understanding and agreed to proceed.   Historic Elements   Sharon Rodriguez is a 38 y.o. year old, female patient evaluated today after her last encounter by our practice on 05/28/2018. Sharon Rodriguez  has a past medical history of ADD (attention deficit disorder),  Anxiety, Asthma, Biliary dyskinesia (12/27/2017), Chronic bronchitis ([place]), Chronic fatigue, Chronic pain, Depression, Fibromyalgia, GERD (gastroesophageal reflux disease), History of stomach ulcers (2019), Hypothyroidism (01/27/2015), Learning disability, Osteoarthritis, Rheumatoid arthritis ([place]), Thyroid disease, and TMJ (dislocation of temporomandibular joint). She also  has a past surgical history that includes Cesarean section; Esophagogastroduodenoscopy (egd) with propofol (N/A, 12/07/2017); and Cholecystectomy (N/A, 01/14/2018). Sharon Rodriguez has a current medication list which includes the following prescription(s): albuterol, albuterol, amphetamine-dextroamphetamine, oxycodone-acetaminophen, oxycodone-acetaminophen, oxycodone-acetaminophen, synthroid, vitamin d (ergocalciferol), and multiple vitamin. She  reports that she has never smoked. She has never used smokeless tobacco. She reports that she does not drink alcohol or use drugs. Sharon Rodriguez is allergic to ibuprofen and promethazine.   HPI  Today, she is being contacted for medication management.   No significant changes in medical history.  States that she is having some pain and itching on her thumb.  Denies any swelling, color change or traumatic event.  This could be sequelae of her rheumatoid arthritis.  Continues on oxycodone.  Compliant with therapy.  PMP checked.  Refill as below.  Pharmacotherapy Assessment   07/29/2018  2   05/28/2018  Oxycodone-Acetaminophen 5-325  120.00 30 Bi Lat   [id number]   063 (3732)   0  30.00 MME  Comm Ins   [place]     Monitoring: Pharmacotherapy: No side-effects or adverse reactions reported. [place] PMP: PDMP reviewed during this encounter.       Compliance: No problems identified. Effectiveness: Clinically acceptable. Plan: Refer to "POC".  Pertinent Labs   SAFETY SCREENING Profile Lab Results  Component Value Date   PREGTESTUR NEGATIVE 01/14/2018   Renal Function Lab Results  Component Value Date   BUN  14  03/20/2013   CREATININE 0.86 03/20/2013   GFRAA >60 03/20/2013   GFRNONAA >60 03/20/2013   Hepatic Function Lab Results  Component Value Date   AST 48 (H) 04/10/2017   ALT 28 04/10/2017   ALBUMIN 4.2 04/10/2017   UDS Summary  Date Value Ref Range Status  01/01/2018 FINAL  Final    Comment:    ==================================================================== TOXASSURE SELECT 13 (MW) ==================================================================== Test                             Result       Flag       Units Drug Present and Declared for Prescription Verification   Oxycodone                      453          EXPECTED   ng/mg creat   Oxymorphone                    396          EXPECTED   ng/mg creat   Noroxycodone                   636          EXPECTED   ng/mg creat   Noroxymorphone                 97           EXPECTED   ng/mg creat    Sources of oxycodone are scheduled prescription medications.    Oxymorphone, noroxycodone, and noroxymorphone are expected    metabolites of oxycodone. Oxymorphone is also available as a    scheduled prescription medication. ==================================================================== Test                      Result    Flag   Units      Ref Range   Creatinine              158              mg/dL      >=20 ==================================================================== Declared Medications:  The flagging and interpretation on this report are based on the  following declared medications.  Unexpected results may arise from  inaccuracies in the declared medications.  **Note: The testing scope of this panel includes these medications:  Oxycodone  **Note: The testing scope of this panel does not include following  reported medications:  Albuterol  Ibuprofen  Levothyroxine  Multivitamin  Omeprazole  Promethazine  Vitamin D2 (Ergocalciferol) ==================================================================== For  clinical consultation, please call [phone]. ====================================================================    Note: Above Lab results reviewed.  Recent imaging  NM Hepato W/EjeCT Fract CLINICAL DATA:  Upper abdominal pain  EXAM: NUCLEAR MEDICINE HEPATOBILIARY IMAGING WITH GALLBLADDER EF  VIEWS: Anterior right upper quadrant.  RADIOPHARMACEUTICALS:  5.265 mCi Tc-29m  Choletec IV  COMPARISON:  None.  FINDINGS: Liver uptake of radiotracer is unremarkable. There is prompt visualization of gallbladder and small bowel, indicating patency of the cystic and common bile ducts. The patient consumed 8 ounces of Ensure orally with calculation of the computer generated ejection fraction of radiotracer from the gallbladder. The patient did experience abdominal pain with the oral Ensure consumption. The computer generated ejection fraction of radiotracer from the gallbladder is toward the lower limits of normal at 38%, normal greater than 33% using the oral agent.  IMPRESSION: Ejection fraction of radiotracer from the gallbladder is within normal limits at 38%, although this value is toward the lower limits of normal, normal greater than 33% using the oral agent. The patient did experience abdominal pain with the oral Ensure consumption. Cystic and common bile ducts are patent as is evidenced by visualization of gallbladder and small bowel.  Electronically Signed   By: Lowella Grip III M.D.   On: 12/14/2017 13:30  Assessment  The primary encounter diagnosis was Chronic pain syndrome. Diagnoses of Spondylosis without myelopathy or radiculopathy, lumbar region, Fibromyalgia, Rheumatoid arthritis, involving unspecified site, unspecified rheumatoid factor presence ([place]), and Long term current use of opiate analgesic were also pertinent to this visit.  Plan of Care  I am having Sharon Rodriguez start on oxyCODONE-acetaminophen and oxyCODONE-acetaminophen. I am also  having her maintain her albuterol, albuterol, Multiple Vitamins-Minerals (MULTIVITAMIN PO), Synthroid, Vitamin D (Ergocalciferol), amphetamine-dextroamphetamine, and oxyCODONE-acetaminophen.  Pharmacotherapy (Medications Ordered): Meds ordered this encounter  Medications  . oxyCODONE-acetaminophen (PERCOCET) 5-325 MG tablet    Sig: Take 1 tablet by mouth every 6 (six) hours as needed for up to 30 days for severe pain. Must last 30 days.    Dispense:  120 tablet    Refill:  0    [place] STOP ACT - Not applicable. Fill one day early if pharmacy is closed on scheduled refill date.  Marland Kitchen oxyCODONE-acetaminophen (PERCOCET) 5-325 MG tablet    Sig: Take 1 tablet by mouth every 6 (six) hours as needed for up to 30 days for severe pain. Must last 30 days.    Dispense:  120 tablet    Refill:  0    [place] STOP ACT - Not applicable. Fill one day early if pharmacy is closed on scheduled refill date.  Marland Kitchen oxyCODONE-acetaminophen (PERCOCET) 5-325 MG tablet    Sig: Take 1 tablet by mouth every 6 (six) hours as needed for up to 30 days for severe pain. Must last 30 days.    Dispense:  120 tablet    Refill:  0    [place] STOP ACT - Not applicable. Fill one day early if pharmacy is closed on scheduled refill date.   Orders:  No orders of the defined types were placed in this encounter.  Follow-up plan:   Return in about 3 months (around 11/20/2018) for Medication Management.    I discussed the assessment and treatment plan with the patient. The patient was provided an opportunity to ask questions and all were answered. The patient agreed with the plan and demonstrated an understanding of the instructions.  Patient advised to call back or seek an in-person evaluation if the symptoms or condition worsens.  Total duration of non-face-to-face encounter: 25 minutes.  Note by: Gillis Santa, MD Date: 08/20/2018; Time: 1:03 PM  Note: This dictation was prepared with Dragon dictation. Any transcriptional errors that may result  from this process are unintentional.  Disclaimer:  * Given the special circumstances of the COVID-19 pandemic, the federal government has announced that the Office for Civil Rights (OCR) will exercise its enforcement discretion and will not impose penalties on physicians using telehealth in the event of noncompliance with regulatory requirements under the [place] (HIPAA) in connection with the good faith provision of telehealth during the GOTLX-72 national public health emergency. ([place])

## 2018-08-23 ENCOUNTER — Other Ambulatory Visit: Payer: Self-pay | Admitting: Family Medicine

## 2018-11-20 ENCOUNTER — Ambulatory Visit
Payer: Medicare Other | Attending: Student in an Organized Health Care Education/Training Program | Admitting: Student in an Organized Health Care Education/Training Program

## 2018-11-20 ENCOUNTER — Other Ambulatory Visit: Payer: Self-pay

## 2018-11-20 ENCOUNTER — Telehealth: Payer: Self-pay | Admitting: *Deleted

## 2018-11-20 ENCOUNTER — Encounter: Payer: Self-pay | Admitting: Student in an Organized Health Care Education/Training Program

## 2018-11-20 DIAGNOSIS — G894 Chronic pain syndrome: Secondary | ICD-10-CM | POA: Diagnosis not present

## 2018-11-20 DIAGNOSIS — M797 Fibromyalgia: Secondary | ICD-10-CM

## 2018-11-20 DIAGNOSIS — M7918 Myalgia, other site: Secondary | ICD-10-CM

## 2018-11-20 DIAGNOSIS — R5382 Chronic fatigue, unspecified: Secondary | ICD-10-CM | POA: Diagnosis not present

## 2018-11-20 DIAGNOSIS — Z79899 Other long term (current) drug therapy: Secondary | ICD-10-CM

## 2018-11-20 DIAGNOSIS — M47816 Spondylosis without myelopathy or radiculopathy, lumbar region: Secondary | ICD-10-CM

## 2018-11-20 DIAGNOSIS — M069 Rheumatoid arthritis, unspecified: Secondary | ICD-10-CM

## 2018-11-20 DIAGNOSIS — Z79891 Long term (current) use of opiate analgesic: Secondary | ICD-10-CM | POA: Diagnosis not present

## 2018-11-20 DIAGNOSIS — M26629 Arthralgia of temporomandibular joint, unspecified side: Secondary | ICD-10-CM

## 2018-11-20 MED ORDER — OXYCODONE-ACETAMINOPHEN 5-325 MG PO TABS: 1.0000 | ORAL_TABLET | Freq: Four times a day (QID) | ORAL | 0 refills | Status: DC | PRN

## 2018-11-20 MED ORDER — OXYCODONE-ACETAMINOPHEN 5-325 MG PO TABS: 1.0000 | ORAL_TABLET | Freq: Four times a day (QID) | ORAL | 0 refills | Status: AC | PRN

## 2018-11-20 NOTE — Progress Notes (Signed)
Pain Management Virtual Encounter Note - Virtual Visit via Telephone Telehealth (real-time audio visits between healthcare provider and patient).   Patient's Phone No. & Preferred Pharmacy:  [phone] (home); There is no such number on file (mobile).; (Preferred) [phone] [email or web address]  [place], [place] [address] Phone: [phone] Fax: [phone]  [facility], [place] - [address] [place] [address] Phone: [phone] Fax: [phone]    Pre-screening note:  Our staff contacted [name] and offered her an "in person", "face-to-face" appointment versus a telephone encounter. She indicated preferring the telephone encounter, at this time.   Reason for Virtual Visit: COVID-19*  Social distancing based on CDC and AMA recommendations.   I contacted Sharon Rodriguez on 11/20/2018 via telephone.      I clearly identified myself as Gillis Santa, MD. I verified that I was speaking with the correct person using two identifiers (Name: Sharon Rodriguez, and date of birth: 30-Dec-1980).  Advanced Informed Consent I sought verbal advanced consent from Sharon Rodriguez for virtual visit interactions. I informed Sharon Rodriguez of possible security and privacy concerns, risks, and limitations associated with providing "not-in-person" medical evaluation and management services. I also informed Sharon Rodriguez of the availability of "in-person" appointments. Finally, I informed her that there would be a charge for the virtual visit and that she could be  personally, fully or partially, financially responsible for it. Sharon Rodriguez expressed understanding and agreed to proceed.   Historic Elements   Sharon Rodriguez is a 38 y.o. year old, female patient evaluated today after her last encounter by our practice on 08/20/2018. Sharon Rodriguez  has a past medical history of ADD (attention deficit disorder),  Anxiety, Asthma, Biliary dyskinesia (12/27/2017), Chronic bronchitis ([place]), Chronic fatigue, Chronic pain, Depression, Fibromyalgia, GERD (gastroesophageal reflux disease), History of stomach ulcers (2019), Hypothyroidism (01/27/2015), Learning disability, Osteoarthritis, Rheumatoid arthritis ([place]), Thyroid disease, and TMJ (dislocation of temporomandibular joint). She also  has a past surgical history that includes Cesarean section; Esophagogastroduodenoscopy (egd) with propofol (N/A, 12/07/2017); and Cholecystectomy (N/A, 01/14/2018). Sharon Rodriguez has a current medication list which includes the following prescription(s): albuterol, albuterol, amphetamine-dextroamphetamine, multiple vitamin, oxycodone-acetaminophen, oxycodone-acetaminophen, oxycodone-acetaminophen, synthroid, and vitamin d (ergocalciferol). She  reports that she has never smoked. She has never used smokeless tobacco. She reports that she does not drink alcohol or use drugs. Sharon Rodriguez is allergic to ibuprofen and promethazine.   HPI  Today, she is being contacted for medication management.   No change in medical history since last visit.  Patient's pain is at baseline.  Patient continues multimodal pain regimen as prescribed.  States that it provides pain relief and improvement in functional status.   Pharmacotherapy Assessment  Analgesic: 10/27/2018  1   08/20/2018  Oxycodone-Acetaminophen 5-325  120.00  30 Bi Lat   [id number] (3732)   0  30.00 MME  Comm Ins   [place]    Monitoring: Pharmacotherapy: No side-effects or adverse reactions reported. [place] PMP: PDMP reviewed during this encounter.       Compliance: No problems identified. Effectiveness: Clinically acceptable. Plan: Refer to "POC".  UDS:  Summary  Date Value Ref Range Status  01/01/2018 FINAL  Final    Comment:    ==================================================================== TOXASSURE SELECT 13  (MW) ==================================================================== Test  Result       Flag       Units Drug Present and Declared for Prescription Verification   Oxycodone                      453          EXPECTED   ng/mg creat   Oxymorphone                    396          EXPECTED   ng/mg creat   Noroxycodone                   636          EXPECTED   ng/mg creat   Noroxymorphone                 97           EXPECTED   ng/mg creat    Sources of oxycodone are scheduled prescription medications.    Oxymorphone, noroxycodone, and noroxymorphone are expected    metabolites of oxycodone. Oxymorphone is also available as a    scheduled prescription medication. ==================================================================== Test                      Result    Flag   Units      Ref Range   Creatinine              158              mg/dL      >=20 ==================================================================== Declared Medications:  The flagging and interpretation on this report are based on the  following declared medications.  Unexpected results may arise from  inaccuracies in the declared medications.  **Note: The testing scope of this panel includes these medications:  Oxycodone  **Note: The testing scope of this panel does not include following  reported medications:  Albuterol  Ibuprofen  Levothyroxine  Multivitamin  Omeprazole  Promethazine  Vitamin D2 (Ergocalciferol) ==================================================================== For clinical consultation, please call [phone]. ====================================================================    Laboratory Chemistry Profile (12 mo)  Renal: No results found for requested labs within last 8760 hours.  Lab Results  Component Value Date   GFRAA >60 03/20/2013   GFRNONAA >60 03/20/2013   Hepatic: No results found for requested labs within last 8760 hours. Lab Results   Component Value Date   AST 48 (H) 04/10/2017   ALT 28 04/10/2017   Other: No results found for requested labs within last 8760 hours. Note:   Assessment  The primary encounter diagnosis was Chronic pain syndrome. Diagnoses of Spondylosis without myelopathy or radiculopathy, lumbar region, Fibromyalgia, Rheumatoid arthritis, involving unspecified site, unspecified rheumatoid factor presence ([place]), Long term current use of opiate analgesic, Myofascial pain, Chronic fatigue, TMJ pain dysfunction syndrome, and Controlled substance agreement signed were also pertinent to this visit.  Plan of Care  I have changed Sharon Rodriguez oxyCODONE-acetaminophen. I am also having her start on oxyCODONE-acetaminophen and oxyCODONE-acetaminophen. Additionally, I am having her maintain her albuterol, albuterol, Multiple Vitamins-Minerals (MULTIVITAMIN PO), amphetamine-dextroamphetamine, Vitamin D (Ergocalciferol), and Synthroid.  Pharmacotherapy (Medications Ordered): Meds ordered this encounter  Medications  . oxyCODONE-acetaminophen (PERCOCET) 5-325 MG tablet    Sig: Take 1 tablet by mouth every 6 (six) hours as needed for severe pain. Must last 30 days.    Dispense:  120 tablet    Refill:  0  [place] STOP ACT - Not applicable. Fill one day early if pharmacy is closed on scheduled refill date.  Marland Kitchen oxyCODONE-acetaminophen (PERCOCET) 5-325 MG tablet    Sig: Take 1 tablet by mouth every 6 (six) hours as needed for severe pain. Must last 30 days.    Dispense:  120 tablet    Refill:  0    [place] STOP ACT - Not applicable. Fill one day early if pharmacy is closed on scheduled refill date.  Marland Kitchen oxyCODONE-acetaminophen (PERCOCET) 5-325 MG tablet    Sig: Take 1 tablet by mouth every 6 (six) hours as needed for severe pain. Must last 30 days.    Dispense:  120 tablet    Refill:  0    [place] STOP ACT - Not applicable. Fill one day early if pharmacy is closed on scheduled refill date.   Orders:  Orders Placed This  Encounter  Procedures  . ToxASSURE Select 13 (MW), Urine    Volume: 30 ml(s). Minimum 3 ml of urine is needed. Document temperature of fresh sample. Indications: Long term (current) use of opiate analgesic [id number])   Follow-up plan:   Return in about 3 months (around 02/19/2019) for Medication Management, virtual.    Recent Visits No visits were found meeting these conditions.  Showing recent visits within past 90 days and meeting all other requirements   Today's Visits Date Type Provider Dept  11/20/18 Office Visit Gillis Santa, MD [facility]  Showing today's visits and meeting all other requirements   Future Appointments No visits were found meeting these conditions.  Showing future appointments within next 90 days and meeting all other requirements   I discussed the assessment and treatment plan with the patient. The patient was provided an opportunity to ask questions and all were answered. The patient agreed with the plan and demonstrated an understanding of the instructions.  Patient advised to call back or seek an in-person evaluation if the symptoms or condition worsens.  Total duration of non-face-to-face encounter: 87minutes.  Note by: Gillis Santa, MD Date: 11/20/2018; Time: 1:48 PM  Note: This dictation was prepared with Dragon dictation. Any transcriptional errors that may result from this process are unintentional.  Disclaimer:  * Given the special circumstances of the COVID-19 pandemic, the federal government has announced that the Office for Civil Rights (OCR) will exercise its enforcement discretion and will not impose penalties on physicians using telehealth in the event of noncompliance with regulatory requirements under the [place] (HIPAA) in connection with the good faith provision of telehealth during the XX123456 national public health emergency. ([place])

## 2018-11-25 ENCOUNTER — Other Ambulatory Visit: Payer: Self-pay | Admitting: Family Medicine

## 2018-11-25 NOTE — Telephone Encounter (Signed)
LVM for pt to call back.

## 2018-11-25 NOTE — Telephone Encounter (Signed)
Routing to provider  

## 2018-11-25 NOTE — Telephone Encounter (Signed)
Needs appointment

## 2018-11-25 NOTE — Telephone Encounter (Signed)
Requested medication (s) are due for refill today: yes  Requested medication (s) are on the active medication list: yes  Last refill:  08/24/2018  Future visit scheduled: no  Notes to clinic: review for refill   Requested Prescriptions  Pending Prescriptions Disp Refills   SYNTHROID 50 MCG tablet [Pharmacy Med Name: SYNTHROID 50 MCG TAB] 90 tablet 0    Sig: TAKE 1 TABLET BY MOUTH ONCE DAILY BEFOREBREAKFAST     Endocrinology:  Hypothyroid Agents Failed - 11/25/2018  1:33 PM      Failed - TSH needs to be rechecked within 3 months after an abnormal result. Refill until TSH is due.      Failed - TSH in normal range and within 360 days    TSH  Date Value Ref Range Status  01/30/2017 3.960 0.450 - 4.500 uIU/mL Final         Failed - Valid encounter within last 12 months    Recent Outpatient Visits          12 months ago Other chronic pain   [place], [place], DO   1 year ago Other chronic pain   [place], [place], DO   1 year ago Other chronic pain   [place], [place], DO   1 year ago Other chronic pain   [place], [place], DO   1 year ago Other chronic pain   [place], [place], DO

## 2018-11-26 ENCOUNTER — Encounter: Payer: Self-pay | Admitting: Family Medicine

## 2018-11-26 NOTE — Telephone Encounter (Signed)
Letter mailed

## 2018-12-02 DIAGNOSIS — F411 Generalized anxiety disorder: Secondary | ICD-10-CM | POA: Diagnosis not present

## 2018-12-02 DIAGNOSIS — F900 Attention-deficit hyperactivity disorder, predominantly inattentive type: Secondary | ICD-10-CM | POA: Diagnosis not present

## 2019-02-18 ENCOUNTER — Other Ambulatory Visit: Payer: Self-pay

## 2019-02-18 ENCOUNTER — Ambulatory Visit
Payer: Medicare Other | Attending: Student in an Organized Health Care Education/Training Program | Admitting: Student in an Organized Health Care Education/Training Program

## 2019-02-18 ENCOUNTER — Encounter: Payer: Self-pay | Admitting: Student in an Organized Health Care Education/Training Program

## 2019-02-18 ENCOUNTER — Ambulatory Visit: Payer: Medicare Other | Admitting: Student in an Organized Health Care Education/Training Program

## 2019-02-18 DIAGNOSIS — M7918 Myalgia, other site: Secondary | ICD-10-CM

## 2019-02-18 DIAGNOSIS — G894 Chronic pain syndrome: Secondary | ICD-10-CM | POA: Diagnosis not present

## 2019-02-18 DIAGNOSIS — M797 Fibromyalgia: Secondary | ICD-10-CM

## 2019-02-18 DIAGNOSIS — M47816 Spondylosis without myelopathy or radiculopathy, lumbar region: Secondary | ICD-10-CM

## 2019-02-18 DIAGNOSIS — M26629 Arthralgia of temporomandibular joint, unspecified side: Secondary | ICD-10-CM | POA: Diagnosis not present

## 2019-02-18 DIAGNOSIS — R5382 Chronic fatigue, unspecified: Secondary | ICD-10-CM | POA: Diagnosis not present

## 2019-02-18 DIAGNOSIS — Z79891 Long term (current) use of opiate analgesic: Secondary | ICD-10-CM

## 2019-02-18 MED ORDER — OXYCODONE-ACETAMINOPHEN 5-325 MG PO TABS: 1.0000 | ORAL_TABLET | Freq: Four times a day (QID) | ORAL | 0 refills | Status: DC | PRN

## 2019-02-18 NOTE — Progress Notes (Signed)
Pain Management Virtual Encounter Note - Virtual Visit via [place] (real-time audio visits between healthcare provider and patient).   Patient's Phone No. & Preferred Pharmacy:  [phone] (home); There is no such number on file (mobile).; (Preferred) [phone] [email or web address]  [place], [place] [address] Phone: [phone] Fax: [phone]  [facility], [place] - [address] [place] [address] Phone: [phone] Fax: [phone]    Pre-screening note:  Our staff contacted [name] and offered her an "in person", "face-to-face" appointment versus a telephone encounter. She indicated preferring the telephone encounter, at this time.   Reason for Virtual Visit: COVID-19*  Social distancing based on CDC and AMA recommendations.   I contacted Sharon Rodriguez on 02/18/2019 via video conference.      I clearly identified myself as Gillis Santa, MD. I verified that I was speaking with the correct person using two identifiers (Name: Sharon Rodriguez, and date of birth: 1981/02/24).  Advanced Informed Consent I sought verbal advanced consent from Sharon Rodriguez for virtual visit interactions. I informed Sharon Rodriguez of possible security and privacy concerns, risks, and limitations associated with providing "not-in-person" medical evaluation and management services. I also informed Sharon Rodriguez of the availability of "in-person" appointments. Finally, I informed her that there would be a charge for the virtual visit and that she could be  personally, fully or partially, financially responsible for it. Sharon Rodriguez expressed understanding and agreed to proceed.   Historic Elements   Sharon Rodriguez is a 38 y.o. year old, female patient evaluated today after her last encounter by our practice on 11/20/2018. Sharon Rodriguez  has a past medical history of ADD (attention  deficit disorder), Anxiety, Asthma, Biliary dyskinesia (12/27/2017), Chronic bronchitis ([place]), Chronic fatigue, Chronic pain, Depression, Fibromyalgia, GERD (gastroesophageal reflux disease), History of stomach ulcers (2019), Hypothyroidism (01/27/2015), Learning disability, Osteoarthritis, Rheumatoid arthritis ([place]), Thyroid disease, and TMJ (dislocation of temporomandibular joint). She also  has a past surgical history that includes Cesarean section; Esophagogastroduodenoscopy (egd) with propofol (N/A, 12/07/2017); and Cholecystectomy (N/A, 01/14/2018). Sharon Rodriguez has a current medication list which includes the following prescription(s): albuterol, albuterol, amphetamine-dextroamphetamine, multiple vitamin, oxycodone-acetaminophen, synthroid, and vitamin d (ergocalciferol). She  reports that she has never smoked. She has never used smokeless tobacco. She reports that she does not drink alcohol or use drugs. Sharon Rodriguez is allergic to ibuprofen and promethazine.   HPI  Today, she is being contacted for medication management.    Patient's pain is at baseline.  Patient continues multimodal pain regimen as prescribed.  States that it provides pain relief and improvement in functional status.   Patient's daughter has been sick with URI symptoms.  She is being tested for Covid.  They are going to be in quarantine for the next 14 days.  Patient needs to have her annual urine toxicology screen done for medication compliance monitoring.  An order was placed for a urine toxicology screen at her last visit 3 months ago however the patient due to Covid has been hesitant to come in.  I informed her that it is very important for [place] to obtain a annual urine toxicology screen especially if we are prescribing her controlled substances.  Patient states that she will try to come in before her next refill next month to complete her urine toxicology screen.   Pharmacotherapy Assessment  Analgesic:  01/25/2019  1  11/20/2018  Oxycodone-Acetaminophen 5-325  120.00  30 Bi Lat   [id number] (3732)   0  30.00 MME  Comm Ins   [place]     Monitoring: Pharmacotherapy: No side-effects or adverse reactions reported. [place] PMP: PDMP reviewed during this encounter.       Compliance: No problems identified. Effectiveness: Clinically acceptable. Plan: Refer to "POC".  UDS:  Summary  Date Value Ref Range Status  01/01/2018 FINAL  Final    Comment:    ==================================================================== TOXASSURE SELECT 13 (MW) ==================================================================== Test                             Result       Flag       Units Drug Present and Declared for Prescription Verification   Oxycodone                      453          EXPECTED   ng/mg creat   Oxymorphone                    396          EXPECTED   ng/mg creat   Noroxycodone                   636          EXPECTED   ng/mg creat   Noroxymorphone                 97           EXPECTED   ng/mg creat    Sources of oxycodone are scheduled prescription medications.    Oxymorphone, noroxycodone, and noroxymorphone are expected    metabolites of oxycodone. Oxymorphone is also available as a    scheduled prescription medication. ==================================================================== Test                      Result    Flag   Units      Ref Range   Creatinine              158              mg/dL      >=20 ==================================================================== Declared Medications:  The flagging and interpretation on this report are based on the  following declared medications.  Unexpected results may arise from  inaccuracies in the declared medications.  **Note: The testing scope of this panel includes these medications:  Oxycodone  **Note: The testing scope of this panel does not include following  reported medications:  Albuterol  Ibuprofen  Levothyroxine  Multivitamin   Omeprazole  Promethazine  Vitamin D2 (Ergocalciferol) ==================================================================== For clinical consultation, please call [phone]. ====================================================================    Laboratory Chemistry Profile (12 mo)  Renal: No results found for requested labs within last 8760 hours.  Lab Results  Component Value Date   GFRAA >60 03/20/2013   GFRNONAA >60 03/20/2013   Hepatic: No results found for requested labs within last 8760 hours. Lab Results  Component Value Date   AST 48 (H) 04/10/2017   ALT 28 04/10/2017   Other: No results found for requested labs within last 8760 hours. Note: Above Lab results reviewed.  Imaging  NM Hepato W/EjeCT Fract CLINICAL DATA:  Upper abdominal pain  EXAM: NUCLEAR MEDICINE HEPATOBILIARY IMAGING WITH GALLBLADDER EF  VIEWS: Anterior right upper  quadrant.  RADIOPHARMACEUTICALS:  5.265 mCi Tc-90m  Choletec IV  COMPARISON:  None.  FINDINGS: Liver uptake of radiotracer is unremarkable. There is prompt visualization of gallbladder and small bowel, indicating patency of the cystic and common bile ducts. The patient consumed 8 ounces of Ensure orally with calculation of the computer generated ejection fraction of radiotracer from the gallbladder. The patient did experience abdominal pain with the oral Ensure consumption. The computer generated ejection fraction of radiotracer from the gallbladder is toward the lower limits of normal at 38%, normal greater than 33% using the oral agent.  IMPRESSION: Ejection fraction of radiotracer from the gallbladder is within normal limits at 38%, although this value is toward the lower limits of normal, normal greater than 33% using the oral agent. The patient did experience abdominal pain with the oral Ensure consumption. Cystic and common bile ducts are patent as is evidenced by visualization of gallbladder and small  bowel.  Electronically Signed   By: Lowella Grip III M.D.   On: 12/14/2017 13:30   Assessment  The primary encounter diagnosis was Spondylosis without myelopathy or radiculopathy, lumbar region. Diagnoses of Chronic pain syndrome, Fibromyalgia, Long term current use of opiate analgesic, Myofascial pain, Chronic fatigue, and TMJ pain dysfunction syndrome were also pertinent to this visit.  Plan of Care   I am having Sharon Rodriguez maintain her albuterol, albuterol, Multiple Vitamins-Minerals (MULTIVITAMIN PO), amphetamine-dextroamphetamine, Vitamin D (Ergocalciferol), Synthroid, and oxyCODONE-acetaminophen.  Pharmacotherapy (Medications Ordered): Meds ordered this encounter  Medications  . oxyCODONE-acetaminophen (PERCOCET) 5-325 MG tablet    Sig: Take 1 tablet by mouth every 6 (six) hours as needed for severe pain. Must last 30 days.    Dispense:  120 tablet    Refill:  0    [place] STOP ACT - Not applicable. Fill one day early if pharmacy is closed on scheduled refill date.   Orders:  Orders Placed This Encounter  Procedures  . UDS (Compliance-13) ([facility]) ([facility]) (Established Pt.)    Volume: 30 ml(s). Minimum 3 ml of urine is needed. Document temperature of fresh sample. Indications: Long term (current) use of opiate analgesic [id number])   Follow-up plan:   Return in about 30 days (around 03/20/2019) for Medication Management, virtual.     Recent Visits Date Type Provider Dept  11/20/18 Office Visit Gillis Santa, MD [facility]  Showing recent visits within past 90 days and meeting all other requirements   Today's Visits Date Type Provider Dept  02/18/19 Office Visit Gillis Santa, MD [facility]  Showing today's visits and meeting all other requirements   Future Appointments No visits were found meeting these conditions.  Showing future appointments within next 90 days and meeting all other requirements   I discussed the assessment and  treatment plan with the patient. The patient was provided an opportunity to ask questions and all were answered. The patient agreed with the plan and demonstrated an understanding of the instructions.  Patient advised to call back or seek an in-person evaluation if the symptoms or condition worsens.  Total duration of non-face-to-face encounter: 25 minutes.  Note by: Gillis Santa, MD Date: 02/18/2019; Time: 9:10 AM  Note: This dictation was prepared with Dragon dictation. Any transcriptional errors that may result from this process are unintentional.  Disclaimer:  * Given the special circumstances of the COVID-19 pandemic, the federal government has announced that the Office for Civil Rights (OCR) will exercise its enforcement discretion and will not impose penalties on physicians using telehealth  in the event of noncompliance with regulatory requirements under the [employer] and Accountability Act (HIPAA) in connection with the good faith provision of telehealth during the UVOZD-66 national public health emergency. (AMA)

## 2019-02-22 ENCOUNTER — Other Ambulatory Visit: Payer: Self-pay | Admitting: Family Medicine

## 2019-02-23 NOTE — Telephone Encounter (Signed)
Requested medication (s) are due for refill today: yes  Requested medication (s) are on the active medication list: yes  Last refill:  11/26/2018  Future visit scheduled: no  Notes to clinic:  overdue for office visit  Review for refill   Requested Prescriptions  Pending Prescriptions Disp Refills   SYNTHROID 50 MCG tablet [Pharmacy Med Name: SYNTHROID 50 MCG TAB] 30 tablet 0    Sig: TAKE 1 TABLET BY MOUTH ONCE DAILY ON AN EMPTY STOMACH. WAIT 30 MINUTES BEFORE TAKING OTHER MEDS.      Endocrinology:  Hypothyroid Agents Failed - 02/22/2019  2:13 PM      Failed - TSH needs to be rechecked within 3 months after an abnormal result. Refill until TSH is due.      Failed - TSH in normal range and within 360 days    TSH  Date Value Ref Range Status  01/30/2017 3.960 0.450 - 4.500 uIU/mL Final          Failed - Valid encounter within last 12 months    Recent Outpatient Visits           1 year ago Other chronic pain   [place], [place], DO   1 year ago Other chronic pain   [place], [place], DO   1 year ago Other chronic pain   [place], [place], DO   1 year ago Other chronic pain   [place], [place], DO   1 year ago Other chronic pain   [place], [place], DO       Future Appointments             In 1 month Jonathon Bellows, MD [place]

## 2019-02-24 ENCOUNTER — Encounter: Payer: Self-pay | Admitting: Family Medicine

## 2019-02-24 NOTE — Telephone Encounter (Signed)
Called pt to schedule, no answer, left vm, sending letter

## 2019-02-24 NOTE — Telephone Encounter (Signed)
Needs appointment

## 2019-03-18 DIAGNOSIS — G894 Chronic pain syndrome: Secondary | ICD-10-CM | POA: Diagnosis not present

## 2019-03-20 ENCOUNTER — Encounter: Payer: Self-pay | Admitting: Student in an Organized Health Care Education/Training Program

## 2019-03-20 ENCOUNTER — Other Ambulatory Visit: Payer: Self-pay

## 2019-03-20 ENCOUNTER — Ambulatory Visit
Payer: Medicare Other | Attending: Student in an Organized Health Care Education/Training Program | Admitting: Student in an Organized Health Care Education/Training Program

## 2019-03-20 DIAGNOSIS — M7918 Myalgia, other site: Secondary | ICD-10-CM

## 2019-03-20 DIAGNOSIS — M47816 Spondylosis without myelopathy or radiculopathy, lumbar region: Secondary | ICD-10-CM

## 2019-03-20 DIAGNOSIS — R5382 Chronic fatigue, unspecified: Secondary | ICD-10-CM | POA: Diagnosis not present

## 2019-03-20 DIAGNOSIS — G894 Chronic pain syndrome: Secondary | ICD-10-CM

## 2019-03-20 DIAGNOSIS — M797 Fibromyalgia: Secondary | ICD-10-CM

## 2019-03-20 DIAGNOSIS — Z79891 Long term (current) use of opiate analgesic: Secondary | ICD-10-CM

## 2019-03-20 MED ORDER — OXYCODONE-ACETAMINOPHEN 5-325 MG PO TABS: 1.0000 | ORAL_TABLET | Freq: Four times a day (QID) | ORAL | 0 refills | Status: DC | PRN

## 2019-03-20 NOTE — Progress Notes (Signed)
Patient's Name: Sharon Rodriguez  MRN: [id number]  Referring Provider: Valerie Roys, DO  DOB: [birth year]aron Rodriguez  PCP: Sharon Roys, DO  DOS: 03/20/2019  Note by: Sharon Santa, MD  Service setting: Virtual Visit (Telephone)  Attending: Gillis Santa, MD  Location: Telephone Encounter  Specialty: Interventional Pain Management  Patient type: Established   Virtual Encounter - Pain Management PROVIDER NOTE: Information contained herein reflects review and annotations entered in association with encounter. Interpretation of such information and data should be left to medically-trained personnel. Information provided to patient can be located elsewhere in the medical record under "Patient Instructions". Document created using STT-dictation technology, any transcriptional errors that Sharon result from process are unintentional.    Contact & Pharmacy Preferred: [phone] Home: [phone] (home) Mobile: There is no such number on file (mobile). E-mail: [email or web address]  [place], [place]. [place] [address] Phone: [phone] Fax: [phone]  [facility], [place] - [address] [place] [address] Phone: [phone] Fax: [phone]   Pre-screening  Sharon Rodriguez offered "in-person" vs "virtual" encounter. She indicated preferring virtual for this encounter.   Reason COVID-19*  Social distancing based on CDC and AMA recommendations.   I contacted Sharon Rodriguez on 03/20/2019 via telephone.      I clearly identified myself as Sharon Santa, MD. I verified that I was speaking with the correct person using two identifiers (Name: Sharon Rodriguez, and date of birth: Sharon Rodriguez, Sharon Rodriguez).  This visit was completed via telephone due to the restrictions of the COVID-19 pandemic. All issues as above were discussed and addressed but no physical exam was performed. If it was felt that the patient should be evaluated in  the office, they were directed there. The patient verbally consented to this visit. Patient was unable to complete an audio/visual visit due to Technical difficulties and/or Lack of internet. Due to the catastrophic nature of the COVID-19 pandemic, this visit was done through audio contact only.  Location of the patient: home address (see Epic for details)  Location of the provider: office  Consent I sought verbal advanced consent from Sharon Rodriguez for virtual visit interactions. I informed Sharon Rodriguez of possible security and privacy concerns, risks, and limitations associated with providing "not-in-person" medical evaluation and management services. I also informed Sharon Rodriguez of the availability of "in-person" appointments. Finally, I informed her that there would be a charge for the virtual visit and that she could be  personally, fully or partially, financially responsible for it. Sharon Rodriguez expressed understanding and agreed to proceed.   Historic Elements   Sharon Rodriguez is a 39 y.o. year old, female patient evaluated today after her last encounter by our practice on 02/18/2019. Sharon Rodriguez  has a past medical history of ADD (attention deficit disorder), Anxiety, Asthma, Biliary dyskinesia (12/27/2017), Chronic bronchitis ([place]), Chronic fatigue, Chronic pain, Depression, Fibromyalgia, GERD (gastroesophageal reflux disease), History of stomach ulcers (2019), Hypothyroidism (01/27/2015), Learning disability, Osteoarthritis, Rheumatoid arthritis ([place]), Thyroid disease, and TMJ (dislocation of temporomandibular joint). She also  has a past surgical history that includes Cesarean section; Esophagogastroduodenoscopy (egd) with propofol (N/A, 12/07/2017); and Cholecystectomy (N/A, 01/14/2018). Sharon Rodriguez has a current medication list which includes the following prescription(s): albuterol, albuterol, amphetamine-dextroamphetamine, multiple vitamin, [START ON 03/25/2019] oxycodone-acetaminophen, synthroid,  and vitamin d (ergocalciferol). She  reports that she has never smoked. She has never used smokeless tobacco. She reports that she does not  drink alcohol or use drugs. Sharon Rodriguez is allergic to ibuprofen and promethazine.   HPI  Today, she is being contacted for medication management.   No change in medical history since last visit.  Patient's pain is at baseline.  Patient continues multimodal pain regimen as prescribed.  States that it provides pain relief and improvement in functional status.   Pharmacotherapy Assessment  Analgesic: 02/23/2019  1   02/18/2019  Oxycodone-Acetaminophen 5-325  120.00  30 Bi Lat   [id number] (3732)   0  30.00 MME  Comm Ins   [place]   Monitoring: Pharmacotherapy: No side-effects or adverse reactions reported. [place] PMP: PDMP reviewed during this encounter.       Compliance: No problems identified. Effectiveness: Clinically acceptable. Plan: Refer to "POC".  UDS: awaiting results from UDS recently completed, then will return to 3 month Rx Summary  Date Value Ref Range Status  01/01/2018 FINAL  Final    Comment:    ==================================================================== TOXASSURE SELECT 13 (MW) ==================================================================== Test                             Result       Flag       Units Drug Present and Declared for Prescription Verification   Oxycodone                      453          EXPECTED   ng/mg creat   Oxymorphone                    396          EXPECTED   ng/mg creat   Noroxycodone                   636          EXPECTED   ng/mg creat   Noroxymorphone                 97           EXPECTED   ng/mg creat    Sources of oxycodone are scheduled prescription medications.    Oxymorphone, noroxycodone, and noroxymorphone are expected    metabolites of oxycodone. Oxymorphone is also available as a    scheduled prescription  medication. ==================================================================== Test                      Result    Flag   Units      Ref Range   Creatinine              158              mg/dL      >=20 ==================================================================== Declared Medications:  The flagging and interpretation on this report are based on the  following declared medications.  Unexpected results Sharon arise from  inaccuracies in the declared medications.  **Note: The testing scope of this panel includes these medications:  Oxycodone  **Note: The testing scope of this panel does not include following  reported medications:  Albuterol  Ibuprofen  Levothyroxine  Multivitamin  Omeprazole  Promethazine  Vitamin D2 (Ergocalciferol) ==================================================================== For clinical consultation, please call [phone]. ====================================================================    Laboratory Chemistry Profile (12 mo)  Renal: No results found for requested labs within last 8760 hours.  Lab Results  Component Value Date   GFRAA >  60 03/20/2013   GFRNONAA >60 03/20/2013   Hepatic: No results found for requested labs within last 8760 hours. Lab Results  Component Value Date   AST 48 (H) 04/10/2017   ALT 28 04/10/2017   Other: No results found for requested labs within last 8760 hours. Note: Above Lab results reviewed.  Imaging  NM Hepato W/EjeCT Fract CLINICAL DATA:  Upper abdominal pain  EXAM: NUCLEAR MEDICINE HEPATOBILIARY IMAGING WITH GALLBLADDER EF  VIEWS: Anterior right upper quadrant.  RADIOPHARMACEUTICALS:  5.265 mCi Tc-62m  Choletec IV  COMPARISON:  None.  FINDINGS: Liver uptake of radiotracer is unremarkable. There is prompt visualization of gallbladder and small bowel, indicating patency of the cystic and common bile ducts. The patient consumed 8 ounces of Ensure orally with calculation of the computer  generated ejection fraction of radiotracer from the gallbladder. The patient did experience abdominal pain with the oral Ensure consumption. The computer generated ejection fraction of radiotracer from the gallbladder is toward the lower limits of normal at 38%, normal greater than 33% using the oral agent.  IMPRESSION: Ejection fraction of radiotracer from the gallbladder is within normal limits at 38%, although this value is toward the lower limits of normal, normal greater than 33% using the oral agent. The patient did experience abdominal pain with the oral Ensure consumption. Cystic and common bile ducts are patent as is evidenced by visualization of gallbladder and small bowel.  Electronically Signed   By: Lowella Grip III M.D.   On: 12/14/2017 13:30   Assessment  The primary encounter diagnosis was Spondylosis without myelopathy or radiculopathy, lumbar region. Diagnoses of Chronic pain syndrome, Fibromyalgia, Long term current use of opiate analgesic, Myofascial pain, and Chronic fatigue were also pertinent to this visit.  Plan of Care   I am having Sharon Rodriguez maintain her albuterol, albuterol, Multiple Vitamins-Minerals (MULTIVITAMIN PO), amphetamine-dextroamphetamine, Vitamin D (Ergocalciferol), Synthroid, and oxyCODONE-acetaminophen.  Pharmacotherapy (Medications Ordered): Meds ordered this encounter  Medications  . oxyCODONE-acetaminophen (PERCOCET) 5-325 MG tablet    Sig: Take 1 tablet by mouth every 6 (six) hours as needed for severe pain. Must last 30 days.    Dispense:  120 tablet    Refill:  0    [place] STOP ACT - Not applicable. Fill one day early if pharmacy is closed on scheduled refill date.   Follow-up plan:   Return in about 4 weeks (around 04/17/2019) for Medication Management, virtual.    I discussed the assessment and treatment plan with the patient. The patient was provided an opportunity to ask questions and all were answered. The patient  agreed with the plan and demonstrated an understanding of the instructions.  Patient advised to call back or seek an in-person evaluation if the symptoms or condition worsens.  Total duration of non-face-to-face encounter: 30 minutes.  Note by: Sharon Santa, MD Date: 03/20/2019; Time: 12:07 PM

## 2019-03-21 LAB — TOXASSURE SELECT 13 (MW), URINE

## 2019-04-03 ENCOUNTER — Ambulatory Visit (INDEPENDENT_AMBULATORY_CARE_PROVIDER_SITE_OTHER): Payer: Medicare Other | Admitting: Family Medicine

## 2019-04-03 ENCOUNTER — Encounter: Payer: Self-pay | Admitting: Family Medicine

## 2019-04-03 VITALS — BP 119/90

## 2019-04-03 DIAGNOSIS — H538 Other visual disturbances: Secondary | ICD-10-CM

## 2019-04-03 NOTE — Progress Notes (Signed)
   BP 119/90   LMP 03/13/2019   SpO2 97%    Subjective:    Patient ID: [name], female    DOB: June 14, 1980, 39 y.o.   MRN: [id number]  HPI: TYLENE STAUNTON is a 39 y.o. female who presents today for follow up after being lost to follow up for 16 months.   Chief Complaint  Patient presents with  . Hypertension  . Headache   For the past couple of weeks she has been having severe headaches and blurred vision. She notes that she could not even see today because of the severity of her blurred vision. Has not seen eye doctor. Cannot do video visit today.   Relevant past medical, surgical, family and social history reviewed and updated as indicated. Interim medical history since our last visit reviewed. Allergies and medications reviewed and updated.  Review of Systems  Constitutional: Positive for fatigue.  Eyes: Positive for visual disturbance. Negative for photophobia, pain, discharge, redness and itching.  Respiratory: Negative.   Cardiovascular: Negative.   Musculoskeletal: Negative.   Neurological: Positive for headaches.  Psychiatric/Behavioral: Negative.     Per HPI unless specifically indicated above     Objective:    BP 119/90   LMP 03/13/2019   SpO2 97%   Wt Readings from Last 3 Encounters:  05/28/18 205 lb (93 kg)  03/28/18 205 lb (93 kg)  01/24/18 200 lb (90.7 kg)    Physical Exam Vitals and nursing note reviewed.  Pulmonary:     Effort: Pulmonary effort is normal. No respiratory distress.     Comments: Speaking in full sentences Neurological:     Mental Status: She is alert.  Psychiatric:        Mood and Affect: Mood normal.        Behavior: Behavior normal.        Thought Content: Thought content normal.        Judgment: Judgment normal.     Results for orders placed or performed in visit on 02/18/19  UDS (Compliance-13) ([facility]) ([facility]) (Established Pt.)  Result Value Ref Range   Summary Note       Assessment & Plan:   Problem  List Items Addressed This Visit    None    Visit Diagnoses    Blurred vision    -  Primary   Discussed that I can't adequately evaluate her symptoms over the phone, especially without video capability. Will get her into the office for eval tomorrow AM.       Follow up plan: Return ASAP in office or UC.    . This visit was completed via telephone due to the restrictions of the COVID-19 pandemic. All issues as above were discussed and addressed but no physical exam was performed. If it was felt that the patient should be evaluated in the office, they were directed there. The patient verbally consented to this visit. Patient was unable to complete an audio/visual visit due to Technical difficulties. Due to the catastrophic nature of the COVID-19 pandemic, this visit was done through audio contact only. . Location of the patient: home . Location of the provider: work . Those involved with this call:  . Provider: Park Liter, DO . CMA: Lauretta Grill, CMA . Front Desk/Registration: Don Perking  . Time spent on call: 10 minutes on the phone discussing health concerns. 15 minutes total spent in review of patient's record and preparation of their chart.

## 2019-04-04 ENCOUNTER — Ambulatory Visit (INDEPENDENT_AMBULATORY_CARE_PROVIDER_SITE_OTHER): Payer: Medicare Other | Admitting: Family Medicine

## 2019-04-04 ENCOUNTER — Other Ambulatory Visit: Payer: Self-pay

## 2019-04-04 ENCOUNTER — Encounter: Payer: Self-pay | Admitting: Family Medicine

## 2019-04-04 VITALS — BP 108/73 | HR 83 | Temp 98.6°F

## 2019-04-04 DIAGNOSIS — K219 Gastro-esophageal reflux disease without esophagitis: Secondary | ICD-10-CM | POA: Diagnosis not present

## 2019-04-04 DIAGNOSIS — H9312 Tinnitus, left ear: Secondary | ICD-10-CM

## 2019-04-04 DIAGNOSIS — R102 Pelvic and perineal pain: Secondary | ICD-10-CM | POA: Diagnosis not present

## 2019-04-04 DIAGNOSIS — H538 Other visual disturbances: Secondary | ICD-10-CM

## 2019-04-04 DIAGNOSIS — G8929 Other chronic pain: Secondary | ICD-10-CM

## 2019-04-04 DIAGNOSIS — R030 Elevated blood-pressure reading, without diagnosis of hypertension: Secondary | ICD-10-CM

## 2019-04-04 DIAGNOSIS — Z13220 Encounter for screening for lipoid disorders: Secondary | ICD-10-CM | POA: Diagnosis not present

## 2019-04-04 DIAGNOSIS — E039 Hypothyroidism, unspecified: Secondary | ICD-10-CM | POA: Diagnosis not present

## 2019-04-04 DIAGNOSIS — R519 Headache, unspecified: Secondary | ICD-10-CM | POA: Diagnosis not present

## 2019-04-04 DIAGNOSIS — E559 Vitamin D deficiency, unspecified: Secondary | ICD-10-CM

## 2019-04-04 DIAGNOSIS — H5712 Ocular pain, left eye: Secondary | ICD-10-CM

## 2019-04-04 DIAGNOSIS — R42 Dizziness and giddiness: Secondary | ICD-10-CM

## 2019-04-04 DIAGNOSIS — E1165 Type 2 diabetes mellitus with hyperglycemia: Secondary | ICD-10-CM | POA: Diagnosis not present

## 2019-04-04 DIAGNOSIS — M069 Rheumatoid arthritis, unspecified: Secondary | ICD-10-CM

## 2019-04-04 DIAGNOSIS — H43813 Vitreous degeneration, bilateral: Secondary | ICD-10-CM | POA: Diagnosis not present

## 2019-04-04 DIAGNOSIS — H814 Vertigo of central origin: Secondary | ICD-10-CM | POA: Diagnosis not present

## 2019-04-04 LAB — BAYER DCA HB A1C WAIVED: HB A1C (BAYER DCA - WAIVED): 9.4 % — ABNORMAL HIGH (ref <7.0)

## 2019-04-04 NOTE — Progress Notes (Signed)
BP 108/73   Pulse 83   Temp 98.6 F (37 C)   LMP 03/13/2019   SpO2 98%    Subjective:    Patient ID: Sharon Rodriguez, female    DOB: [birth year], 39 y.o.   MRN: [id number]  HPI: Sharon Rodriguez is a 39 y.o. female  Chief Complaint  Patient presents with  . Blurred Vision   Has been having blurred vision and headache. Has been having pain in her head  MIGRAINES Duration: couple of weeks Onset: sudden Severity: severe Quality: aching Frequency: during the day, she's OK, comes on at night and when she wakes up in the AM Location: front of her head Headache duration: about an hour after she wakes up Radiation: no Time of day headache occurs: at bedtime, when she wakes up, occasionally during the day Alleviating factors: getting off the phone, getting in the  Aggravating factors: looking at the phone, bright lights  Headache status at time of visit: asymptomatic Treatments attempted: Treatments attempted: naps, and regular medicine    Aura: no Nausea:  yes Vomiting: yes- couple of times Photophobia:  yes Phonophobia:  yes Effect on social functioning:  yes Numbers of missed days of school/work each month:  Confusion:  yes Gait disturbance/ataxia:  yes Behavioral changes:  no Fevers:  no   DIZZINESS Duration: couple of weeks Description of symptoms: lightheaded Duration of episode: hours Dizziness frequency: recurrent Provoking factors: none Aggravating factors:  none Triggered by rolling over in bed: no Triggered by bending over: no Aggravated by head movement: no Aggravated by exertion, coughing, loud noises: no Recent head injury: no Recent or current viral symptoms: no History of vasovagal episodes: no Nausea: yes Vomiting: yes Tinnitus: yes Hearing loss: yes Aural fullness: yes Headache: yes Photophobia/phonophobia: yes Unsteady gait: yes Postural instability: no Diplopia, dysarthria, dysphagia or weakness: yes Related to exertion: no Pallor:  no Diaphoresis: no Dyspnea: no Chest pain: no  EYE PAIN Duration:  2-3 weeks Involved eye:  left Onset: sudden Severity: severe  Quality: sharp Foreign body sensation:no Visual impairment: yes Eye redness: no Discharge: no Crusting or matting of eyelids: no Swelling: no Photophobia: yes Itching: yes Tearing: yes Headache: yes Floaters: yes URI symptoms: no Contact lens use: no Close contacts with similar problems: no Eye trauma: no Aggravating factors:  Alleviating factors:  Status: worse   Relevant past medical, surgical, family and social history reviewed and updated as indicated. Interim medical history since our last visit reviewed. Allergies and medications reviewed and updated.  Review of Systems  Constitutional: Negative.   HENT: Negative.   Eyes: Positive for photophobia, pain and visual disturbance. Negative for discharge, redness and itching.  Respiratory: Negative.   Cardiovascular: Negative.   Gastrointestinal: Negative.   Musculoskeletal: Negative.   Allergic/Immunologic: Negative.   Neurological: Positive for dizziness, light-headedness and headaches. Negative for tremors, seizures, syncope, facial asymmetry, speech difficulty, weakness and numbness.  Psychiatric/Behavioral: Negative.     Per HPI unless specifically indicated above     Objective:    BP 108/73   Pulse 83   Temp 98.6 F (37 C)   LMP 03/13/2019   SpO2 98%   Wt Readings from Last 3 Encounters:  05/28/18 205 lb (93 kg)  03/28/18 205 lb (93 kg)  01/24/18 200 lb (90.7 kg)     Hearing Screening   125Hz  250Hz  500Hz  1000Hz  2000Hz  3000Hz  4000Hz  6000Hz  8000Hz   Right ear:           Left ear:  Visual Acuity Screening   Right eye Left eye Both eyes  Without correction: 20/25 20/40 20/20   With correction:       Physical Exam Vitals and nursing note reviewed.  Constitutional:      General: She is not in acute distress.    Appearance: Normal appearance. She is not  ill-appearing, toxic-appearing or diaphoretic.  HENT:     Head: Normocephalic and atraumatic.     Right Ear: External ear normal.     Left Ear: External ear normal.     Nose: Nose normal.     Mouth/Throat:     Mouth: Mucous membranes are moist.     Pharynx: Oropharynx is clear.  Eyes:     General: No scleral icterus.       Right eye: No discharge.        Left eye: No discharge.     Extraocular Movements: Extraocular movements intact.     Conjunctiva/sclera: Conjunctivae normal.     Pupils: Pupils are equal, round, and reactive to light.     Comments: Nystagmus to the L   Cardiovascular:     Rate and Rhythm: Normal rate and regular rhythm.     Pulses: Normal pulses.     Heart sounds: Normal heart sounds. No murmur. No friction rub. No gallop.   Pulmonary:     Effort: Pulmonary effort is normal. No respiratory distress.     Breath sounds: Normal breath sounds. No stridor. No wheezing, rhonchi or rales.  Chest:     Chest wall: No tenderness.  Musculoskeletal:        General: Normal range of motion.     Cervical back: Normal range of motion and neck supple.  Skin:    General: Skin is warm and dry.     Capillary Refill: Capillary refill takes less than 2 seconds.     Coloration: Skin is not jaundiced or pale.     Findings: No bruising, erythema, lesion or rash.  Neurological:     General: No focal deficit present.     Mental Status: She is alert and oriented to person, place, and time. Mental status is at baseline.  Psychiatric:        Mood and Affect: Mood normal.        Behavior: Behavior normal.        Thought Content: Thought content normal.        Judgment: Judgment normal.     Results for orders placed or performed in visit on 04/04/19  Bayer DCA Hb A1c Waived  Result Value Ref Range   HB A1C (BAYER DCA - WAIVED) 9.4 (H) <7.0 %  CBC with Differential/Platelet  Result Value Ref Range   WBC 9.4 3.4 - 10.8 x10E3/uL   RBC 5.02 3.77 - 5.28 x10E6/uL   Hemoglobin 13.6  11.1 - 15.9 g/dL   Hematocrit 42.3 34.0 - 46.6 %   MCV 84 79 - 97 fL   MCH 27.1 26.6 - 33.0 pg   MCHC 32.2 31.5 - 35.7 g/dL   RDW 14.0 11.7 - 15.4 %   Platelets 258 150 - 450 x10E3/uL   Neutrophils 69 Not Estab. %   Lymphs 25 Not Estab. %   Monocytes 4 Not Estab. %   Eos 1 Not Estab. %   Basos 1 Not Estab. %   Neutrophils Absolute 6.4 1.4 - 7.0 x10E3/uL   Lymphocytes Absolute 2.4 0.7 - 3.1 x10E3/uL   Monocytes Absolute 0.4 0.1 - 0.9 x10E3/uL  EOS (ABSOLUTE) 0.1 0.0 - 0.4 x10E3/uL   Basophils Absolute 0.1 0.0 - 0.2 x10E3/uL   Immature Granulocytes 0 Not Estab. %   Immature Grans (Abs) 0.0 0.0 - 0.1 x10E3/uL  Comprehensive metabolic panel  Result Value Ref Range   Glucose 225 (H) 65 - 99 mg/dL   BUN 8 6 - 24 mg/dL   Creatinine, Ser 0.72 0.57 - 1.00 mg/dL   GFR calc non Af Amer 105 >59 mL/min/1.73   GFR calc Af Amer 121 >59 mL/min/1.73   BUN/Creatinine Ratio 11 9 - 23   Sodium 138 134 - 144 mmol/L   Potassium 4.3 3.5 - 5.2 mmol/L   Chloride 98 96 - 106 mmol/L   CO2 24 20 - 29 mmol/L   Calcium 9.5 8.7 - 10.2 mg/dL   Total Protein 7.6 6.0 - 8.5 g/dL   Albumin 4.4 3.8 - 4.8 g/dL   Globulin, Total 3.2 1.5 - 4.5 g/dL   Albumin/Globulin Ratio 1.4 1.2 - 2.2   Bilirubin Total 0.4 0.0 - 1.2 mg/dL   Alkaline Phosphatase 92 39 - 117 IU/L   AST 121 (H) 0 - 40 IU/L   ALT 50 (H) 0 - 32 IU/L  Lipid Panel w/o Chol/HDL Ratio  Result Value Ref Range   Cholesterol, Total 147 100 - 199 mg/dL   Triglycerides 200 (H) 0 - 149 mg/dL   HDL 40 >39 mg/dL   VLDL Cholesterol Cal 33 5 - 40 mg/dL   LDL Chol Calc (NIH) 74 0 - 99 mg/dL  TSH  Result Value Ref Range   TSH 9.950 (H) 0.450 - 4.500 uIU/mL  VITAMIN D 25 Hydroxy (Vit-D Deficiency, Fractures)  Result Value Ref Range   Vit D, 25-Hydroxy 23.1 (L) 30.0 - 100.0 ng/mL      Assessment & Plan:   Problem List Items Addressed This Visit      Digestive   Gastroesophageal reflux disease    Due for refills and follow up labs. Ordered today.  Await results.       Relevant Orders   CBC with Differential/Platelet (Completed)   Comprehensive metabolic panel (Completed)   Magnesium     Endocrine   Hypothyroidism    Due for refills and follow up labs. Ordered today. Await results.       Relevant Orders   CBC with Differential/Platelet (Completed)   Comprehensive metabolic panel (Completed)   TSH (Completed)   Magnesium     Musculoskeletal and Integument   Rheumatoid arthritis ([place])    Continue to follow with rheumatology and pain. Call with any concerns.         Other   Chronic pelvic pain in female    Due for refills and follow up labs. Ordered today. Await results.       Relevant Orders   UA/M w/rflx Culture, Routine   Magnesium   Vitamin D deficiency    Due for refills and follow up labs. Ordered today. Await results.       Relevant Orders   CBC with Differential/Platelet (Completed)   Comprehensive metabolic panel (Completed)   VITAMIN D 25 Hydroxy (Vit-D Deficiency, Fractures) (Completed)   Magnesium    Other Visit Diagnoses    Blurred vision    -  Primary   Eye pain and blurred vision. Will get her into opthalmology ASAP. Referral generated today.   Relevant Orders   Bayer DCA Hb A1c Waived (Completed)   CBC with Differential/Platelet (Completed)   Comprehensive metabolic panel (Completed)  Magnesium   MR Brain W Wo Contrast   Ambulatory referral to Ophthalmology   Acute intractable headache, unspecified headache type       Of unclear etiology. No headache now. Will check MRI and get her into opthalmology. Call with any concerns.    Relevant Orders   CBC with Differential/Platelet (Completed)   Comprehensive metabolic panel (Completed)   Magnesium   MR Brain W Wo Contrast   Elevated BP without diagnosis of hypertension       Better on recheck. Will work on [employer] and call with any concerns.    Relevant Orders   Microalbumin, Urine Waived   Magnesium   Screening for cholesterol level        Labs drawn today. Await results.    Relevant Orders   Lipid Panel w/o Chol/HDL Ratio (Completed)   Magnesium   Left eye pain       Eye pain and blurred vision. Will get her into opthalmology ASAP. Referral generated today.   Relevant Orders   MR Brain W Wo Contrast   Ambulatory referral to Ophthalmology   Dizziness       Will Obtain MRI to r/o acoustic neuroma with the tinnitus and vertigo. Await results. Call with any concerns.    Relevant Orders   MR Brain W Wo Contrast   Ambulatory referral to Ophthalmology   Tinnitus of left ear       Will Obtain MRI to r/o acoustic neuroma with the tinnitus and vertigo. Await results. Call with any concerns.    Relevant Orders   MR Brain W Wo Contrast   Vertigo of central origin       Will Obtain MRI to r/o acoustic neuroma with the tinnitus and vertigo. Await results. Call with any concerns.    Relevant Orders   MR Brain W Wo Contrast       Follow up plan: Return Pending results.

## 2019-04-05 LAB — CBC WITH DIFFERENTIAL/PLATELET
Basophils Absolute: 0.1 10*3/uL (ref 0.0–0.2)
Basos: 1 %
EOS (ABSOLUTE): 0.1 10*3/uL (ref 0.0–0.4)
Eos: 1 %
Hematocrit: 42.3 % (ref 34.0–46.6)
Hemoglobin: 13.6 g/dL (ref 11.1–15.9)
Immature Grans (Abs): 0 10*3/uL (ref 0.0–0.1)
Immature Granulocytes: 0 %
Lymphocytes Absolute: 2.4 10*3/uL (ref 0.7–3.1)
Lymphs: 25 %
MCH: 27.1 pg (ref 26.6–33.0)
MCHC: 32.2 g/dL (ref 31.5–35.7)
MCV: 84 fL (ref 79–97)
Monocytes Absolute: 0.4 10*3/uL (ref 0.1–0.9)
Monocytes: 4 %
Neutrophils Absolute: 6.4 10*3/uL (ref 1.4–7.0)
Neutrophils: 69 %
Platelets: 258 10*3/uL (ref 150–450)
RBC: 5.02 x10E6/uL (ref 3.77–5.28)
RDW: 14 % (ref 11.7–15.4)
WBC: 9.4 10*3/uL (ref 3.4–10.8)

## 2019-04-05 LAB — COMPREHENSIVE METABOLIC PANEL
ALT: 50 IU/L — ABNORMAL HIGH (ref 0–32)
AST: 121 IU/L — ABNORMAL HIGH (ref 0–40)
Albumin/Globulin Ratio: 1.4 (ref 1.2–2.2)
Albumin: 4.4 g/dL (ref 3.8–4.8)
Alkaline Phosphatase: 92 IU/L (ref 39–117)
BUN/Creatinine Ratio: 11 (ref 9–23)
BUN: 8 mg/dL (ref 6–24)
Bilirubin Total: 0.4 mg/dL (ref 0.0–1.2)
CO2: 24 mmol/L (ref 20–29)
Calcium: 9.5 mg/dL (ref 8.7–10.2)
Chloride: 98 mmol/L (ref 96–106)
Creatinine, Ser: 0.72 mg/dL (ref 0.57–1.00)
GFR calc Af Amer: 121 mL/min/{1.73_m2} (ref >59)
GFR calc non Af Amer: 105 mL/min/{1.73_m2} (ref >59)
Globulin, Total: 3.2 g/dL (ref 1.5–4.5)
Glucose: 225 mg/dL — ABNORMAL HIGH (ref 65–99)
Potassium: 4.3 mmol/L (ref 3.5–5.2)
Sodium: 138 mmol/L (ref 134–144)
Total Protein: 7.6 g/dL (ref 6.0–8.5)

## 2019-04-05 LAB — LIPID PANEL W/O CHOL/HDL RATIO
Cholesterol, Total: 147 mg/dL (ref 100–199)
HDL: 40 mg/dL (ref >39)
LDL Chol Calc (NIH): 74 mg/dL (ref 0–99)
Triglycerides: 200 mg/dL — ABNORMAL HIGH (ref 0–149)
VLDL Cholesterol Cal: 33 mg/dL (ref 5–40)

## 2019-04-05 LAB — VITAMIN D 25 HYDROXY (VIT D DEFICIENCY, FRACTURES): Vit D, 25-Hydroxy: 23.1 ng/mL — ABNORMAL LOW (ref 30.0–100.0)

## 2019-04-05 LAB — TSH: TSH: 9.95 u[IU]/mL — ABNORMAL HIGH (ref 0.450–4.500)

## 2019-04-07 ENCOUNTER — Encounter: Payer: Self-pay | Admitting: Family Medicine

## 2019-04-07 NOTE — Assessment & Plan Note (Signed)
Due for refills and follow up labs. Ordered today. Await results.

## 2019-04-07 NOTE — Assessment & Plan Note (Signed)
Continue to follow with rheumatology and pain. Call with any concerns.

## 2019-04-08 ENCOUNTER — Ambulatory Visit: Payer: Medicare Other | Admitting: Family Medicine

## 2019-04-08 ENCOUNTER — Ambulatory Visit (INDEPENDENT_AMBULATORY_CARE_PROVIDER_SITE_OTHER): Payer: Medicare Other | Admitting: Family Medicine

## 2019-04-08 DIAGNOSIS — E039 Hypothyroidism, unspecified: Secondary | ICD-10-CM | POA: Diagnosis not present

## 2019-04-08 DIAGNOSIS — E1139 Type 2 diabetes mellitus with other diabetic ophthalmic complication: Secondary | ICD-10-CM | POA: Diagnosis not present

## 2019-04-08 MED ORDER — METFORMIN HCL ER 500 MG PO TB24: 500.0000 mg | ORAL_TABLET | Freq: Every day | ORAL | 1 refills | Status: DC

## 2019-04-08 MED ORDER — LEVOTHYROXINE SODIUM 75 MCG PO TABS: 75.0000 ug | ORAL_TABLET | Freq: Every day | ORAL | 3 refills | Status: DC

## 2019-04-08 MED ORDER — VITAMIN D (ERGOCALCIFEROL) 1.25 MG (50000 UNIT) PO CAPS: 50000.0000 [IU] | ORAL_CAPSULE | ORAL | 1 refills | Status: DC

## 2019-04-08 NOTE — Progress Notes (Signed)
LMP 03/13/2019    Subjective:    Patient ID: Sharon Rodriguez, female    DOB: 1981/01/30, 39 y.o.   MRN: [id number]  HPI: Sharon Rodriguez is a 39 y.o. female  Chief Complaint  Patient presents with  . Diabetes  . Hypothyroidism   DIABETES Hypoglycemic episodes:yes Polydipsia/polyuria: yes Visual disturbance: yes Chest pain: no Paresthesias: no Glucose Monitoring: no  Accucheck frequency: Not Checking Taking Insulin?: no Blood Pressure Monitoring: not checking Retinal Examination: Up to Date Foot Exam: Not up to Date Diabetic Education: Not Completed Pneumovax: Not up to Date Influenza: Not up to Date Aspirin: no  HYPOTHYROIDISM Thyroid control status:uncontrolled Satisfied with current treatment? yes Medication side effects: no Medication compliance: excellent compliance Recent dose adjustment:no Fatigue: yes Cold intolerance: no Heat intolerance: no Weight gain: yes Weight loss: no Constipation: no Diarrhea/loose stools: yes Palpitations: no Lower extremity edema: no Anxiety/depressed mood: yes   Relevant past medical, surgical, family and social history reviewed and updated as indicated. Interim medical history since our last visit reviewed. Allergies and medications reviewed and updated.  Review of Systems  Constitutional: Negative.   Respiratory: Negative.   Cardiovascular: Negative.   Gastrointestinal: Negative.   Neurological: Negative.   Psychiatric/Behavioral: Negative.     Per HPI unless specifically indicated above     Objective:    LMP 03/13/2019   Wt Readings from Last 3 Encounters:  05/28/18 205 lb (93 kg)  03/28/18 205 lb (93 kg)  01/24/18 200 lb (90.7 kg)    Physical Exam Vitals and nursing note reviewed.  Pulmonary:     Effort: Pulmonary effort is normal. No respiratory distress.     Comments: Speaking in full sentences Neurological:     Mental Status: She is alert.  Psychiatric:        Mood and Affect: Mood normal.         Behavior: Behavior normal.        Thought Content: Thought content normal.        Judgment: Judgment normal.     Results for orders placed or performed in visit on 04/04/19  Bayer DCA Hb A1c Waived  Result Value Ref Range   HB A1C (BAYER DCA - WAIVED) 9.4 (H) <7.0 %  CBC with Differential/Platelet  Result Value Ref Range   WBC 9.4 3.4 - 10.8 x10E3/uL   RBC 5.02 3.77 - 5.28 x10E6/uL   Hemoglobin 13.6 11.1 - 15.9 g/dL   Hematocrit 42.3 34.0 - 46.6 %   MCV 84 79 - 97 fL   MCH 27.1 26.6 - 33.0 pg   MCHC 32.2 31.5 - 35.7 g/dL   RDW 14.0 11.7 - 15.4 %   Platelets 258 150 - 450 x10E3/uL   Neutrophils 69 Not Estab. %   Lymphs 25 Not Estab. %   Monocytes 4 Not Estab. %   Eos 1 Not Estab. %   Basos 1 Not Estab. %   Neutrophils Absolute 6.4 1.4 - 7.0 x10E3/uL   Lymphocytes Absolute 2.4 0.7 - 3.1 x10E3/uL   Monocytes Absolute 0.4 0.1 - 0.9 x10E3/uL   EOS (ABSOLUTE) 0.1 0.0 - 0.4 x10E3/uL   Basophils Absolute 0.1 0.0 - 0.2 x10E3/uL   Immature Granulocytes 0 Not Estab. %   Immature Grans (Abs) 0.0 0.0 - 0.1 x10E3/uL  Comprehensive metabolic panel  Result Value Ref Range   Glucose 225 (H) 65 - 99 mg/dL   BUN 8 6 - 24 mg/dL   Creatinine, Ser 0.72 0.57 - 1.00 mg/dL  GFR calc non Af Amer 105 >59 mL/min/1.73   GFR calc Af Amer 121 >59 mL/min/1.73   BUN/Creatinine Ratio 11 9 - 23   Sodium 138 134 - 144 mmol/L   Potassium 4.3 3.5 - 5.2 mmol/L   Chloride 98 96 - 106 mmol/L   CO2 24 20 - 29 mmol/L   Calcium 9.5 8.7 - 10.2 mg/dL   Total Protein 7.6 6.0 - 8.5 g/dL   Albumin 4.4 3.8 - 4.8 g/dL   Globulin, Total 3.2 1.5 - 4.5 g/dL   Albumin/Globulin Ratio 1.4 1.2 - 2.2   Bilirubin Total 0.4 0.0 - 1.2 mg/dL   Alkaline Phosphatase 92 39 - 117 IU/L   AST 121 (H) 0 - 40 IU/L   ALT 50 (H) 0 - 32 IU/L  Lipid Panel w/o Chol/HDL Ratio  Result Value Ref Range   Cholesterol, Total 147 100 - 199 mg/dL   Triglycerides 200 (H) 0 - 149 mg/dL   HDL 40 >39 mg/dL   VLDL Cholesterol Cal 33 5 - 40  mg/dL   LDL Chol Calc (NIH) 74 0 - 99 mg/dL  TSH  Result Value Ref Range   TSH 9.950 (H) 0.450 - 4.500 uIU/mL  VITAMIN D 25 Hydroxy (Vit-D Deficiency, Fractures)  Result Value Ref Range   Vit D, 25-Hydroxy 23.1 (L) 30.0 - 100.0 ng/mL      Assessment & Plan:   Problem List Items Addressed This Visit      Endocrine   Hypothyroidism    Not under good control. Will increase her dose to 14mcg and recheck 6 weeks. Call with any concerns.       Relevant Medications   levothyroxine (SYNTHROID) 75 MCG tablet   Diabetes mellitus ([place]) - Primary    Newly diagnosed with A1c of 9.4. Will start her on metformin and get her into see the [facility] center. Recheck 1 month. Call with any concerns. Continue to monitor.       Relevant Orders   Ambulatory referral to diabetic education       Follow up plan: Return in about 4 weeks (around 05/06/2019).    . This visit was completed via telephone due to the restrictions of the COVID-19 pandemic. All issues as above were discussed and addressed but no physical exam was performed. If it was felt that the patient should be evaluated in the office, they were directed there. The patient verbally consented to this visit. Patient was unable to complete an audio/visual visit due to Lack of equipment. Due to the catastrophic nature of the COVID-19 pandemic, this visit was done through audio contact only. . Location of the patient: home . Location of the provider: work . Those involved with this call:  . Provider: Park Liter, DO . CMA: Tiffany Reel, CMA . Front Desk/Registration: Don Perking  . Time spent on call: 25 minutes on the phone discussing health concerns. 40 minutes total spent in review of patient's record and preparation of their chart.

## 2019-04-09 ENCOUNTER — Telehealth: Payer: Self-pay | Admitting: Family Medicine

## 2019-04-09 MED ORDER — METFORMIN HCL ER 500 MG PO TB24: 1000.0000 mg | ORAL_TABLET | Freq: Two times a day (BID) | ORAL | 1 refills | Status: DC

## 2019-04-09 NOTE — Telephone Encounter (Signed)
Called pharmacy. They received a prescription for the metformin for 1 tablet daily but with a dispense amount of 120. Was it supposed to be written for 4 tabs daily?

## 2019-04-09 NOTE — Telephone Encounter (Signed)
Copied from [place] [id number]. Topic: General - Other >> Apr 09, 2019  1:24 PM Keene Breath wrote: Reason for CRM: Need clarification on patient's dosage of metFORMIN (GLUCOPHAGE-XR) 500 MG 24 hr tablet.  Please call to verify at [phone]

## 2019-04-12 ENCOUNTER — Encounter: Payer: Self-pay | Admitting: Family Medicine

## 2019-04-12 DIAGNOSIS — E119 Type 2 diabetes mellitus without complications: Secondary | ICD-10-CM | POA: Insufficient documentation

## 2019-04-12 NOTE — Assessment & Plan Note (Signed)
Newly diagnosed with A1c of 9.4. Will start her on metformin and get her into see the lifestyle center. Recheck 1 month. Call with any concerns. Continue to monitor.

## 2019-04-12 NOTE — Assessment & Plan Note (Signed)
Not under good control. Will increase her dose to 71mcg and recheck 6 weeks. Call with any concerns.

## 2019-04-16 ENCOUNTER — Encounter: Payer: Self-pay | Admitting: Student in an Organized Health Care Education/Training Program

## 2019-04-16 ENCOUNTER — Ambulatory Visit (INDEPENDENT_AMBULATORY_CARE_PROVIDER_SITE_OTHER): Payer: Medicare Other | Admitting: Gastroenterology

## 2019-04-16 DIAGNOSIS — Z9049 Acquired absence of other specified parts of digestive tract: Secondary | ICD-10-CM

## 2019-04-16 DIAGNOSIS — R197 Diarrhea, unspecified: Secondary | ICD-10-CM

## 2019-04-16 DIAGNOSIS — K9089 Other intestinal malabsorption: Secondary | ICD-10-CM

## 2019-04-16 MED ORDER — CHOLESTYRAMINE 4 G PO PACK: 4.0000 g | PACK | Freq: Two times a day (BID) | ORAL | 12 refills | Status: DC

## 2019-04-16 NOTE — Progress Notes (Signed)
Sharon Rodriguez , MD [address]  [place]  [place], [place] [address]  Main: [phone]  Fax: [phone]   Primary Care Physician: Valerie Roys, DO  Virtual Visit via Telephone Note  I connected with patient on 04/16/19 at 11:00 AM EST by telephone and verified that I am speaking with the correct person using two identifiers.   I discussed the limitations, risks, security and privacy concerns of performing an evaluation and management service by telephone and the availability of in person appointments. I also discussed with the patient that there may be a patient responsible charge related to this service. The patient expressed understanding and agreed to proceed.  Location of Patient: Home Location of Provider: Home Persons involved: Patient and provider only   History of Present Illness: Chief Complaint  Patient presents with  . Diarrhea    HPI: Sharon Rodriguez is a 39 y.o. female   Summary of history : She was last seen in my office in July 2019 for abdominal pain.  Right upper quadrant the pain started early 2019 , on and off, most days of the week, can last for hours, wakes her up from her sleep. Sharp in nature, had been on naprosyn long term, mother had issues with gall bladder. She also had some diarrhea , gas,bloating,she consumed 3 glasses of sweet tea aday .  RUQ USG 03/2017 showed no abnormality except fatty infiltration.  At that time she also had some diarrhea which had improved after stopping consumption of all sweet tea and artificial sugars  Labs 04/2017: stool H pylori antigen ,  Calpropectin, c diff, GI PCR-  negative, Lipase normal   04/2017 : CT abdomen : fatty liver , adhesions from C section    Interval history   09/24/2017-04/16/2019  12/14/2017: HIDA scan: Ejection fraction of the gallbladder was 38% and she had pain with oral and sugar consumption.  12/07/2017: EGD: Normal EGD.  Biopsies of the stomach and esophagus were  normal.  01/30/2018: Underwent cholecystectomy and the pathology of the specimen demonstrated chronic cholecystitis without cholelithiasis.  Since the surgery she has been having constant diarrhea right after meals very watery multiple episodes a day.  Feels better in terms of the abdominal pain but still gets abdominal discomfort in the upper part of her abdomen at times.   Current Outpatient Medications  Medication Sig Dispense Refill  . albuterol (PROVENTIL HFA;VENTOLIN HFA) 108 (90 Base) MCG/ACT inhaler Inhale 1 puff into the lungs every 6 (six) hours as needed for wheezing or shortness of breath.    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 ) 75 mL 12  . amphetamine-dextroamphetamine (ADDERALL) 30 MG tablet Take 1 tablet by mouth 2 (two) times daily.    Marland Kitchen levothyroxine (SYNTHROID) 75 MCG tablet Take 1 tablet (75 mcg total) by mouth daily. Synthroid brand name necessary 90 tablet 3  . metFORMIN (GLUCOPHAGE-XR) 500 MG 24 hr tablet Take 2 tablets (1,000 mg total) by mouth 2 (two) times daily. 120 tablet 1  . Multiple Vitamins-Minerals (MULTIVITAMIN PO) Take 1 tablet by mouth daily.    . Multiple Vitamins-Minerals (VITAMIN D3 COMPLETE) TABS     . oxyCODONE-acetaminophen (PERCOCET) 5-325 MG tablet Take 1 tablet by mouth every 6 (six) hours as needed for severe pain. Must  last 30 days. 120 tablet 0  . Vitamin D, Ergocalciferol, (DRISDOL) 1.25 MG (50000 UNIT) CAPS capsule Take 1 capsule (50,000 Units total) by mouth once a week. 12 capsule 1   No current facility-administered medications for this visit.    Allergies as of 04/16/2019 - Review Complete 04/16/2019  Allergen Reaction Noted  . Ibuprofen Diarrhea and Nausea Only 01/27/2015  . Promethazine Rash 01/27/2015    Review of  Systems:    All systems reviewed and negative except where noted in HPI.   Observations/Objective:  Labs: CMP     Component Value Date/Time   NA 138 04/04/2019 1154   NA 137 03/20/2013 2031   K 4.3 04/04/2019 1154   K 3.6 03/20/2013 2031   CL 98 04/04/2019 1154   CL 103 03/20/2013 2031   CO2 24 04/04/2019 1154   CO2 27 03/20/2013 2031   GLUCOSE 225 (H) 04/04/2019 1154   GLUCOSE 102 (H) 03/20/2013 2031   BUN 8 04/04/2019 1154   BUN 14 03/20/2013 2031   CREATININE 0.72 04/04/2019 1154   CREATININE 0.86 03/20/2013 2031   CALCIUM 9.5 04/04/2019 1154   CALCIUM 8.9 03/20/2013 2031   PROT 7.6 04/04/2019 1154   ALBUMIN 4.4 04/04/2019 1154   AST 121 (H) 04/04/2019 1154   ALT 50 (H) 04/04/2019 1154   ALKPHOS 92 04/04/2019 1154   BILITOT 0.4 04/04/2019 1154   GFRNONAA 105 04/04/2019 1154   GFRNONAA >60 03/20/2013 2031   GFRAA 121 04/04/2019 1154   GFRAA >60 03/20/2013 2031   Lab Results  Component Value Date   WBC 9.4 04/04/2019   HGB 13.6 04/04/2019   HCT 42.3 04/04/2019   MCV 84 04/04/2019   PLT 258 04/04/2019    Imaging Studies: No results found.  Assessment and Plan:   Sharon Rodriguez is a 39 y.o. y/o female here to discuss about diarrhea since her cholecystectomy.  Likely bile salt mediated diarrhea.  She also has issues with upper abdominal discomfort which seems to be better than what it was in the past but persists.  Plan  1.  Commence on Questran one sachet twice a day. 2.  Telephone visit in 4 weeks to see if she is doing better.  If not doing better she will need an in office visit to discuss issues of abdominal pain and diarrhea.  In the past she consumed a large quantity of artificial sweeteners which would need to be addressed at the subsequent visit if causing an osmotic diarrhea    I discussed the assessment and treatment plan with the patient. The patient was provided an opportunity to ask questions and all were answered. The patient agreed with the  plan and demonstrated an understanding of the instructions.   The patient was advised to call back or seek an in-person evaluation if the symptoms worsen or if the condition fails to improve as anticipated.  I provided 12 minutes of non-face-to-face time during this encounter.  Dr Sharon Bellows MD,MRCP [facility]) Gastroenterology/Hepatology Pager: [phone]   Speech recognition software was used to dictate this note.

## 2019-04-17 ENCOUNTER — Encounter: Payer: Self-pay | Admitting: Student in an Organized Health Care Education/Training Program

## 2019-04-17 ENCOUNTER — Other Ambulatory Visit: Payer: Self-pay

## 2019-04-17 ENCOUNTER — Ambulatory Visit
Payer: Medicare Other | Attending: Student in an Organized Health Care Education/Training Program | Admitting: Student in an Organized Health Care Education/Training Program

## 2019-04-17 DIAGNOSIS — R5382 Chronic fatigue, unspecified: Secondary | ICD-10-CM | POA: Diagnosis not present

## 2019-04-17 DIAGNOSIS — M7918 Myalgia, other site: Secondary | ICD-10-CM

## 2019-04-17 DIAGNOSIS — M47816 Spondylosis without myelopathy or radiculopathy, lumbar region: Secondary | ICD-10-CM | POA: Diagnosis not present

## 2019-04-17 DIAGNOSIS — M797 Fibromyalgia: Secondary | ICD-10-CM

## 2019-04-17 DIAGNOSIS — G894 Chronic pain syndrome: Secondary | ICD-10-CM

## 2019-04-17 MED ORDER — OXYCODONE-ACETAMINOPHEN 5-325 MG PO TABS: 1.0000 | ORAL_TABLET | Freq: Four times a day (QID) | ORAL | 0 refills | Status: AC | PRN

## 2019-04-17 MED ORDER — OXYCODONE-ACETAMINOPHEN 5-325 MG PO TABS: 1.0000 | ORAL_TABLET | Freq: Four times a day (QID) | ORAL | 0 refills | Status: DC | PRN

## 2019-04-17 NOTE — Progress Notes (Signed)
Patient: Sharon Rodriguez  Service Category: E/M  Provider: Bilal Lateef, MD  DOB: 06/16/1980  DOS: 04/17/2019  Location: Office  MRN: [id number]  Setting: Ambulatory outpatient  Referring Provider: Johnson, Megan P, DO  Type: Established Patient  Specialty: Interventional Pain Management  PCP: Johnson, Megan P, DO  Location: Home  Delivery: TeleHealth     Virtual Encounter - Pain Management PROVIDER NOTE: Information contained herein reflects review and annotations entered in association with encounter. Interpretation of such information and data should be left to medically-trained personnel. Information provided to patient can be located elsewhere in the medical record under "Patient Instructions". Document created using STT-dictation technology, any transcriptional errors that may result from process are unintentional.    Contact & Pharmacy Preferred: [phone] Home: [phone] (home) Mobile: There is no such number on file (mobile). E-mail: [email or web address]  [facility] - [place], [place] - [address] [place] [address] Phone: [phone] Fax: [phone]  [facility] - [place], [place] - [address] [place] [address] Phone: [phone] Fax: [phone]   Pre-screening  Sharon Rodriguez offered "in-person" vs "virtual" encounter. She indicated preferring virtual for this encounter.   Reason COVID-19*  Social distancing based on CDC and AMA recommendations.   I contacted Sharon Rodriguez on 04/17/2019 via telephone.      I clearly identified myself as Bilal Lateef, MD. I verified that I was speaking with the correct person using two identifiers (Name: Sharon Rodriguez, and date of birth: 41/23/1982).  This visit was completed via telephone due to the restrictions of the COVID-19 pandemic. All issues as above were discussed and addressed but no physical exam was performed. If it was felt that the patient should be evaluated  in the office, they were directed there. The patient verbally consented to this visit. Patient was unable to complete an audio/visual visit due to Technical difficulties and/or Lack of internet. Due to the catastrophic nature of the COVID-19 pandemic, this visit was done through audio contact only.  Location of the patient: home address (see Epic for details)  Location of the provider: office  Consent I sought verbal advanced consent from Sharon Rodriguez for virtual visit interactions. I informed Sharon Rodriguez of possible security and privacy concerns, risks, and limitations associated with providing "not-in-person" medical evaluation and management services. I also informed Sharon Rodriguez of the availability of "in-person" appointments. Finally, I informed her that there would be a charge for the virtual visit and that she could be  personally, fully or partially, financially responsible for it. Sharon Rodriguez expressed understanding and agreed to proceed.   Historic Elements   Sharon Rodriguez is a 39 y.o. year old, female patient evaluated today after her last contact with our practice on 03/20/2019. Sharon Rodriguez  has a past medical history of ADD (attention deficit disorder), Anxiety, Asthma, Biliary dyskinesia (12/27/2017), Chronic bronchitis (HCC), Chronic fatigue, Chronic pain, Depression, Fibromyalgia, GERD (gastroesophageal reflux disease), History of stomach ulcers (2019), Hypothyroidism (01/27/2015), Learning disability, Osteoarthritis, Rheumatoid arthritis (HCC), Thyroid disease, and TMJ (dislocation of temporomandibular joint). She also  has a past surgical history that includes Cesarean section; Esophagogastroduodenoscopy (egd) with propofol (N/A, 12/07/2017); and Cholecystectomy (N/A, 01/14/2018). Sharon Rodriguez has a current medication list which includes the following prescription(s): albuterol, albuterol, amphetamine-dextroamphetamine, levothyroxine, metformin, multiple vitamin, vitamin d3 complete, vitamin d  (ergocalciferol), cholestyramine, [START ON 04/24/2019] oxycodone-acetaminophen, [START ON 05/24/2019] oxycodone-acetaminophen, and [START ON 06/23/2019] oxycodone-acetaminophen. She  reports that she   has never smoked. She has never used smokeless tobacco. She reports that she does not drink alcohol or use drugs. Sharon Rodriguez is allergic to ibuprofen and promethazine.   HPI  Today, she is being contacted for medication management.   UDS completed as below.   Elevated A1c and LFTs, patient's lipid panel was appropriate.  She was started on Metformin by her PCP.  Patient also states that she saw GI specialist.  Was told that she does not have irritable bowel syndrome.   Patient's pain is at baseline.  Patient continues multimodal pain regimen as prescribed.  States that it provides pain relief and improvement in functional status.   Pharmacotherapy Assessment  Analgesic: 03/25/2019  1   03/20/2019  Oxycodone-Acetaminophen 5-325  120.00  30 Bi Lat   [id number]   786 (3732)   0  30.00 MME  Comm Ins   [place]   Monitoring: [place] PMP: PDMP reviewed during this encounter.       Pharmacotherapy: No side-effects or adverse reactions reported. Compliance: No problems identified. Effectiveness: Clinically acceptable. Plan: Refer to "POC".  UDS:  Summary  Date Value Ref Range Status  03/18/2019 Note  Final    Comment:    ==================================================================== [employer] Select 13 (MW) ==================================================================== Test                             Result       Flag       Units Drug Present and Declared for Prescription Verification   Oxycodone                      2047         EXPECTED   ng/mg creat   Oxymorphone                    998          EXPECTED   ng/mg creat   Noroxycodone                   1413         EXPECTED   ng/mg creat   Noroxymorphone                 147          EXPECTED   ng/mg creat    Sources of oxycodone are scheduled  prescription medications.    Oxymorphone, noroxycodone, and noroxymorphone are expected    metabolites of oxycodone. Oxymorphone is also available as a    scheduled prescription medication. Drug Absent but Declared for Prescription Verification   Amphetamine                    Not Detected UNEXPECTED ng/mg creat ==================================================================== Test                      Result    Flag   Units      Ref Range   Creatinine              338              mg/dL      >=20 ==================================================================== Declared Medications:  The flagging and interpretation on this report are based on the  following declared medications.  Unexpected results may arise from  inaccuracies in the declared medications.  **Note: The testing scope of this panel includes these medications:    Amphetamine (Adderall)  Oxycodone  **Note: The testing scope of this panel does not include the  following reported medications:  Acetaminophen  Albuterol  Levothyroxine (Synthroid)  Multivitamin  Vitamin D2 (Drisdol) ==================================================================== For clinical consultation, please call [phone]. ====================================================================    Laboratory Chemistry Profile   Renal Lab Results  Component Value Date   BUN 8 04/04/2019   CREATININE 0.72 04/04/2019   BCR 11 04/04/2019   GFRAA 121 04/04/2019   GFRNONAA 105 04/04/2019    Hepatic Lab Results  Component Value Date   AST 121 (H) 04/04/2019   ALT 50 (H) 04/04/2019   ALBUMIN 4.4 04/04/2019   ALKPHOS 92 04/04/2019   LIPASE 43 04/10/2017    Electrolytes Lab Results  Component Value Date   NA 138 04/04/2019   K 4.3 04/04/2019   CL 98 04/04/2019   CALCIUM 9.5 04/04/2019    Bone Lab Results  Component Value Date   VD25OH 23.1 (L) 04/04/2019    Coagulation Lab Results  Component Value Date   PLT 258 04/04/2019     Cardiovascular Lab Results  Component Value Date   TROPONINI < 0.02 03/20/2013   HGB 13.6 04/04/2019   HCT 42.3 04/04/2019    Inflammation (CRP: Acute Phase) (ESR: Chronic Phase) No results found for: CRP, ESRSEDRATE, LATICACIDVEN    Note: Above Lab results reviewed.  Imaging  NM Hepato W/EjeCT Fract CLINICAL DATA:  Upper abdominal pain  EXAM: NUCLEAR MEDICINE HEPATOBILIARY IMAGING WITH GALLBLADDER EF  VIEWS: Anterior right upper quadrant.  RADIOPHARMACEUTICALS:  5.265 mCi Tc-99m  Choletec IV  COMPARISON:  None.  FINDINGS: Liver uptake of radiotracer is unremarkable. There is prompt visualization of gallbladder and small bowel, indicating patency of the cystic and common bile ducts. The patient consumed 8 ounces of Ensure orally with calculation of the computer generated ejection fraction of radiotracer from the gallbladder. The patient did experience abdominal pain with the oral Ensure consumption. The computer generated ejection fraction of radiotracer from the gallbladder is toward the lower limits of normal at 38%, normal greater than 33% using the oral agent.  IMPRESSION: Ejection fraction of radiotracer from the gallbladder is within normal limits at 38%, although this value is toward the lower limits of normal, normal greater than 33% using the oral agent. The patient did experience abdominal pain with the oral Ensure consumption. Cystic and common bile ducts are patent as is evidenced by visualization of gallbladder and small bowel.  Electronically Signed   By: William  Woodruff III M.D.   On: 12/14/2017 13:30  Assessment  The primary encounter diagnosis was Chronic pain syndrome. Diagnoses of Spondylosis without myelopathy or radiculopathy, lumbar region, Fibromyalgia, Myofascial pain, and Chronic fatigue were also pertinent to this visit.  Plan of Care  I am having Sharon Rodriguez start on oxyCODONE-acetaminophen and oxyCODONE-acetaminophen. I am  also having her maintain her albuterol, albuterol, Multiple Vitamins-Minerals (MULTIVITAMIN PO), amphetamine-dextroamphetamine, levothyroxine, Vitamin D (Ergocalciferol), metFORMIN, Vitamin D3 Complete, and oxyCODONE-acetaminophen.  Pharmacotherapy (Medications Ordered): Meds ordered this encounter  Medications  . oxyCODONE-acetaminophen (PERCOCET) 5-325 MG tablet    Sig: Take 1 tablet by mouth every 6 (six) hours as needed for severe pain. Must last 30 days.    Dispense:  120 tablet    Refill:  0    [place] STOP ACT - Not applicable. Fill one day early if pharmacy is closed on scheduled refill date.  . oxyCODONE-acetaminophen (PERCOCET) 5-325 MG tablet    Sig: Take 1 tablet by mouth every 6 (six)   hours as needed for severe pain. Must last 30 days.    Dispense:  120 tablet    Refill:  0    [place] STOP ACT - Not applicable. Fill one day early if pharmacy is closed on scheduled refill date.  . oxyCODONE-acetaminophen (PERCOCET) 5-325 MG tablet    Sig: Take 1 tablet by mouth every 6 (six) hours as needed for severe pain. Must last 30 days.    Dispense:  120 tablet    Refill:  0    [place] STOP ACT - Not applicable. Fill one day early if pharmacy is closed on scheduled refill date.    Follow-up plan:   Return in about 3 months (around 07/15/2019) for Medication Management.    Recent Visits Date Type Provider Dept  03/20/19 Office Visit Lateef, Bilal, MD [facility]  02/18/19 Office Visit Lateef, Bilal, MD [facility]  Showing recent visits within past 90 days and meeting all other requirements   Today's Visits Date Type Provider Dept  04/17/19 Office Visit Lateef, Bilal, MD [facility]  Showing today's visits and meeting all other requirements   Future Appointments No visits were found meeting these conditions.  Showing future appointments within next 90 days and meeting all other requirements   I discussed the assessment and treatment plan with the patient. The  patient was provided an opportunity to ask questions and all were answered. The patient agreed with the plan and demonstrated an understanding of the instructions.  Patient advised to call back or seek an in-person evaluation if the symptoms or condition worsens.  Duration of encounter: 25 minutes.  Note by: Bilal Lateef, MD Date: 04/17/2019; Time: 11:45 AM 

## 2019-04-22 ENCOUNTER — Telehealth (INDEPENDENT_AMBULATORY_CARE_PROVIDER_SITE_OTHER): Payer: Medicare Other | Admitting: Family Medicine

## 2019-04-22 ENCOUNTER — Encounter: Payer: Self-pay | Admitting: Family Medicine

## 2019-04-22 DIAGNOSIS — R110 Nausea: Secondary | ICD-10-CM | POA: Diagnosis not present

## 2019-04-22 DIAGNOSIS — R519 Headache, unspecified: Secondary | ICD-10-CM

## 2019-04-22 MED ORDER — NAPROXEN 500 MG PO TABS: 500.0000 mg | ORAL_TABLET | Freq: Two times a day (BID) | ORAL | 2 refills | Status: DC

## 2019-04-22 MED ORDER — ONDANSETRON 8 MG PO TBDP: 8.0000 mg | ORAL_TABLET | Freq: Three times a day (TID) | ORAL | 2 refills | Status: DC | PRN

## 2019-04-22 NOTE — Progress Notes (Signed)
There were no vitals taken for this visit.   Subjective:    Patient ID: Sharon Rodriguez, female    DOB: [birth year], 39 y.o.   MRN: [id number]  HPI: Sharon Rodriguez is a 39 y.o. female  Chief Complaint  Patient presents with  . Diabetes  . migraines    Causing vomiting   MIGRAINES Duration: weeks Onset: sudden Severity: severe Quality: aching Frequency: constant Location: temples Headache duration: at night and in the AM, comes and goes, 20 minutes in AM Radiation: no Time of day headache occurs: at night and in the AM Alleviating factors: sleeping Aggravating factors: light Headache status at time of visit: current headache Treatments attempted: Treatments attempted: rest   Aura: no Nausea:  yes Vomiting: yes Photophobia:  yes Phonophobia:  no Effect on social functioning:  yes Confusion:  yes Gait disturbance/ataxia:  no Behavioral changes:  no Fevers:  no  Relevant past medical, surgical, family and social history reviewed and updated as indicated. Interim medical history since our last visit reviewed. Allergies and medications reviewed and updated.  Review of Systems  Constitutional: Negative.   Respiratory: Negative.   Cardiovascular: Negative.   Gastrointestinal: Positive for nausea and vomiting. Negative for abdominal distention, abdominal pain, anal bleeding, blood in stool, constipation, diarrhea and rectal pain.  Musculoskeletal: Negative.   Skin: Negative.   Neurological: Positive for dizziness. Negative for tremors, seizures, syncope, facial asymmetry, speech difficulty, weakness, light-headedness, numbness and headaches.  Psychiatric/Behavioral: Negative.     Per HPI unless specifically indicated above     Objective:    There were no vitals taken for this visit.  Wt Readings from Last 3 Encounters:  05/28/18 205 lb (93 kg)  03/28/18 205 lb (93 kg)  01/24/18 200 lb (90.7 kg)    Physical Exam Vitals and nursing note reviewed.    Pulmonary:     Effort: Pulmonary effort is normal. No respiratory distress.     Comments: Speaking in full sentences Neurological:     Mental Status: She is alert.  Psychiatric:        Mood and Affect: Mood normal.        Behavior: Behavior normal.        Thought Content: Thought content normal.        Judgment: Judgment normal.     Results for orders placed or performed in visit on 04/04/19  Bayer DCA Hb A1c Waived  Result Value Ref Range   HB A1C (BAYER DCA - WAIVED) 9.4 (H) <7.0 %  CBC with Differential/Platelet  Result Value Ref Range   WBC 9.4 3.4 - 10.8 x10E3/uL   RBC 5.02 3.77 - 5.28 x10E6/uL   Hemoglobin 13.6 11.1 - 15.9 g/dL   Hematocrit 42.3 34.0 - 46.6 %   MCV 84 79 - 97 fL   MCH 27.1 26.6 - 33.0 pg   MCHC 32.2 31.5 - 35.7 g/dL   RDW 14.0 11.7 - 15.4 %   Platelets 258 150 - 450 x10E3/uL   Neutrophils 69 Not Estab. %   Lymphs 25 Not Estab. %   Monocytes 4 Not Estab. %   Eos 1 Not Estab. %   Basos 1 Not Estab. %   Neutrophils Absolute 6.4 1.4 - 7.0 x10E3/uL   Lymphocytes Absolute 2.4 0.7 - 3.1 x10E3/uL   Monocytes Absolute 0.4 0.1 - 0.9 x10E3/uL   EOS (ABSOLUTE) 0.1 0.0 - 0.4 x10E3/uL   Basophils Absolute 0.1 0.0 - 0.2 x10E3/uL   Immature Granulocytes 0 Not  Estab. %   Immature Grans (Abs) 0.0 0.0 - 0.1 x10E3/uL  Comprehensive metabolic panel  Result Value Ref Range   Glucose 225 (H) 65 - 99 mg/dL   BUN 8 6 - 24 mg/dL   Creatinine, Ser 0.72 0.57 - 1.00 mg/dL   GFR calc non Af Amer 105 >59 mL/min/1.73   GFR calc Af Amer 121 >59 mL/min/1.73   BUN/Creatinine Ratio 11 9 - 23   Sodium 138 134 - 144 mmol/L   Potassium 4.3 3.5 - 5.2 mmol/L   Chloride 98 96 - 106 mmol/L   CO2 24 20 - 29 mmol/L   Calcium 9.5 8.7 - 10.2 mg/dL   Total Protein 7.6 6.0 - 8.5 g/dL   Albumin 4.4 3.8 - 4.8 g/dL   Globulin, Total 3.2 1.5 - 4.5 g/dL   Albumin/Globulin Ratio 1.4 1.2 - 2.2   Bilirubin Total 0.4 0.0 - 1.2 mg/dL   Alkaline Phosphatase 92 39 - 117 IU/L   AST 121 (H) 0 -  40 IU/L   ALT 50 (H) 0 - 32 IU/L  Lipid Panel w/o Chol/HDL Ratio  Result Value Ref Range   Cholesterol, Total 147 100 - 199 mg/dL   Triglycerides 200 (H) 0 - 149 mg/dL   HDL 40 >39 mg/dL   VLDL Cholesterol Cal 33 5 - 40 mg/dL   LDL Chol Calc (NIH) 74 0 - 99 mg/dL  TSH  Result Value Ref Range   TSH 9.950 (H) 0.450 - 4.500 uIU/mL  VITAMIN D 25 Hydroxy (Vit-D Deficiency, Fractures)  Result Value Ref Range   Vit D, 25-Hydroxy 23.1 (L) 30.0 - 100.0 ng/mL      Assessment & Plan:   Problem List Items Addressed This Visit    None    Visit Diagnoses    Acute intractable headache, unspecified headache type    -  Primary   To have MRI shortly. Will treat with naproxen. Recheck 2 weeks. Call with any concerns.    Relevant Medications   naproxen (NAPROSYN) 500 MG tablet   Nausea       Likely due to blurred vision from newly diagnosed DM. Continue metformin. Will start her on zofran. Call with any concerns. Continue to monitor.        Follow up plan: Return in about 2 weeks (around 05/06/2019).    . This visit was completed via telephone due to the restrictions of the COVID-19 pandemic. All issues as above were discussed and addressed but no physical exam was performed. If it was felt that the patient should be evaluated in the office, they were directed there. The patient verbally consented to this visit. Patient was unable to complete an audio/visual visit due to Technical Difficulties. Due to the catastrophic nature of the COVID-19 pandemic, this visit was done through audio contact only. . Location of the patient: home . Location of the provider: work . Those involved with this call:  . Provider: Park Liter, DO . CMA: Tiffany Reel, CMA . Front Desk/Registration: Don Perking  . Time spent on call: 15 minutes on the phone discussing health concerns. 23 minutes total spent in review of patient's record and preparation of their chart.

## 2019-04-24 ENCOUNTER — Telehealth: Payer: Self-pay | Admitting: Family Medicine

## 2019-04-24 NOTE — Telephone Encounter (Signed)
-----   Message from Valerie Roys, [place] sent at 04/22/2019  5:05 PM EST ----- I don't know why she thought that. I said a month 2 weeks ago. She was seen today, so 2 weeks. ----- Message ----- From: Stark Klein Sent: 04/22/2019   5:02 PM EST To: Valerie Roys, DO  Pt stated she thought her appt is suppose to be in 6 weeks, please advise.

## 2019-04-24 NOTE — Telephone Encounter (Signed)
Message sent through [facility] for fu.

## 2019-04-28 NOTE — Telephone Encounter (Signed)
LVM for pt to call back.

## 2019-04-29 ENCOUNTER — Other Ambulatory Visit: Payer: Self-pay

## 2019-04-29 ENCOUNTER — Ambulatory Visit
Admission: RE | Admit: 2019-04-29 | Discharge: 2019-04-29 | Disposition: A | Discharge status: Home / Self Care | Payer: Medicare Other | Source: Ambulatory Visit | Attending: Family Medicine | Admitting: Family Medicine

## 2019-04-29 DIAGNOSIS — H5712 Ocular pain, left eye: Secondary | ICD-10-CM

## 2019-04-29 DIAGNOSIS — H538 Other visual disturbances: Secondary | ICD-10-CM

## 2019-04-29 DIAGNOSIS — H9312 Tinnitus, left ear: Secondary | ICD-10-CM | POA: Diagnosis not present

## 2019-04-29 DIAGNOSIS — R42 Dizziness and giddiness: Secondary | ICD-10-CM | POA: Diagnosis not present

## 2019-04-29 DIAGNOSIS — R519 Headache, unspecified: Secondary | ICD-10-CM

## 2019-04-29 DIAGNOSIS — R4781 Slurred speech: Secondary | ICD-10-CM | POA: Diagnosis not present

## 2019-04-29 DIAGNOSIS — H814 Vertigo of central origin: Secondary | ICD-10-CM

## 2019-04-29 MED ORDER — GADOBUTROL 1 MMOL/ML IV SOLN
9.0000 mL | Freq: Once | INTRAVENOUS | Status: AC | PRN
  Administered 2019-04-29: 9 mL via INTRAVENOUS

## 2019-05-18 ENCOUNTER — Other Ambulatory Visit: Payer: Self-pay | Admitting: Family Medicine

## 2019-05-19 ENCOUNTER — Ambulatory Visit: Payer: Medicaid Other | Admitting: Family Medicine

## 2019-05-19 ENCOUNTER — Telehealth: Payer: Self-pay | Admitting: Family Medicine

## 2019-05-19 NOTE — Telephone Encounter (Signed)
Copied from [facility] [phone]. Topic: General - Other >> May 19, 2019  1:34 PM Keene Breath wrote: Reason for CRM: Patient called to cancel her appt. For today and would like the nurse to call her to discuss rescheduling it.  CB# [phone]

## 2019-05-21 DIAGNOSIS — F900 Attention-deficit hyperactivity disorder, predominantly inattentive type: Secondary | ICD-10-CM | POA: Diagnosis not present

## 2019-05-21 DIAGNOSIS — F411 Generalized anxiety disorder: Secondary | ICD-10-CM | POA: Diagnosis not present

## 2019-05-28 ENCOUNTER — Telehealth: Payer: Self-pay | Admitting: Gastroenterology

## 2019-05-28 ENCOUNTER — Encounter (INDEPENDENT_AMBULATORY_CARE_PROVIDER_SITE_OTHER): Payer: Medicare Other | Admitting: Gastroenterology

## 2019-05-28 DIAGNOSIS — Z5329 Procedure and treatment not carried out because of patient's decision for other reasons: Secondary | ICD-10-CM

## 2019-05-28 NOTE — Telephone Encounter (Signed)
Called pt to r/s televisit she states she never received a call from Dr. Vicente Males and she did not want to r/s this apt

## 2019-05-28 NOTE — Progress Notes (Signed)
error 

## 2019-06-24 ENCOUNTER — Encounter: Payer: Self-pay | Admitting: Family Medicine

## 2019-06-24 ENCOUNTER — Telehealth: Payer: Self-pay | Admitting: Family Medicine

## 2019-06-24 ENCOUNTER — Telehealth (INDEPENDENT_AMBULATORY_CARE_PROVIDER_SITE_OTHER): Payer: Medicare Other | Admitting: Family Medicine

## 2019-06-24 DIAGNOSIS — E039 Hypothyroidism, unspecified: Secondary | ICD-10-CM

## 2019-06-24 DIAGNOSIS — E1139 Type 2 diabetes mellitus with other diabetic ophthalmic complication: Secondary | ICD-10-CM | POA: Diagnosis not present

## 2019-06-24 NOTE — Assessment & Plan Note (Signed)
Still feeling really tired. Will check labs and adjust as needed. Await results.

## 2019-06-24 NOTE — Assessment & Plan Note (Signed)
Tolerating her medicine well and vision has improved. Will get her in for labs ASAP and adjust medication as needed. Await results.

## 2019-06-24 NOTE — Chronic Care Management (AMB) (Signed)
  Chronic Care Management   Outreach Note  06/24/2019 Name: Sharon Rodriguez MRN: [id number] DOB: 1980/09/18  Sharon Rodriguez is a 39 y.o. year old female who is a primary care patient of Valerie Roys, DO. I reached out to Sharon Rodriguez by phone today in response to a referral sent by Ms. Darlin Priestly PCP, Park Liter DO     An unsuccessful telephone outreach was attempted today. The patient was referred to the case management team for assistance with care management and care coordination.   Follow Up Plan: A HIPPA compliant phone message was left for the patient providing contact information and requesting a return call.  The care management team will reach out to the patient again over the next 7 days.  If patient returns call to provider office, please advise to call Embedded Care Management Care Guide Glenna Durand LPN at [phone]  Xachary Hambly, LPN Health Advisor, [place] Management ??[email or web address] ??[phone]

## 2019-06-24 NOTE — Progress Notes (Signed)
There were no vitals taken for this visit.   Subjective:    Patient ID: Sharon Rodriguez, female    DOB: 1980/10/21, 38 y.o.   MRN: [id number]  HPI: Sharon Rodriguez is a 39 y.o. female  Chief Complaint  Patient presents with  . Diabetes   DIABETES- doing well with the metformin, vision is doing a lot better.  Hypoglycemic episodes:no Polydipsia/polyuria: yes Visual disturbance: no Chest pain: no Paresthesias: yes Glucose Monitoring: no  Accucheck frequency: Not Checking Taking Insulin?: no Blood Pressure Monitoring: not checking Retinal Examination: Up to Date Foot Exam: Not Up to Date Diabetic Education: Not Completed Pneumovax: Not up to Date Influenza: Not up to Date Aspirin: no  HYPOTHYROIDISM Thyroid control status:unknown Satisfied with current treatment? yes Medication side effects: no Medication compliance: good compliance Recent dose adjustment:no Fatigue: yes Cold intolerance: no Heat intolerance: no Weight gain: no Weight loss: no Constipation: no Diarrhea/loose stools: yes Palpitations: no Lower extremity edema: no Anxiety/depressed mood: yes   Relevant past medical, surgical, family and social history reviewed and updated as indicated. Interim medical history since our last visit reviewed. Allergies and medications reviewed and updated.  Review of Systems  Constitutional: Negative.   HENT: Negative.   Respiratory: Negative.   Cardiovascular: Negative.   Musculoskeletal: Negative.   Neurological: Negative.   Psychiatric/Behavioral: Negative.     Per HPI unless specifically indicated above     Objective:    There were no vitals taken for this visit.  Wt Readings from Last 3 Encounters:  05/28/18 205 lb (93 kg)  03/28/18 205 lb (93 kg)  01/24/18 200 lb (90.7 kg)    Physical Exam Vitals and nursing note reviewed.  Constitutional:      General: She is not in acute distress.    Appearance: Normal appearance. She is not  ill-appearing, toxic-appearing or diaphoretic.  HENT:     Head: Normocephalic and atraumatic.     Right Ear: External ear normal.     Left Ear: External ear normal.     Nose: Nose normal.     Mouth/Throat:     Mouth: Mucous membranes are moist.     Pharynx: Oropharynx is clear.  Eyes:     General: No scleral icterus.       Right eye: No discharge.        Left eye: No discharge.     Conjunctiva/sclera: Conjunctivae normal.     Pupils: Pupils are equal, round, and reactive to light.  Pulmonary:     Effort: Pulmonary effort is normal. No respiratory distress.     Comments: Speaking in full sentences Musculoskeletal:        General: Normal range of motion.     Cervical back: Normal range of motion.  Skin:    Coloration: Skin is not jaundiced or pale.     Findings: No bruising, erythema, lesion or rash.  Neurological:     Mental Status: She is alert and oriented to person, place, and time. Mental status is at baseline.  Psychiatric:        Mood and Affect: Mood normal.        Behavior: Behavior normal.        Thought Content: Thought content normal.        Judgment: Judgment normal.     Results for orders placed or performed in visit on 04/04/19  Bayer DCA Hb A1c Waived  Result Value Ref Range   HB A1C (BAYER DCA - WAIVED) 9.4 (  H) <7.0 %  CBC with Differential/Platelet  Result Value Ref Range   WBC 9.4 3.4 - 10.8 x10E3/uL   RBC 5.02 3.77 - 5.28 x10E6/uL   Hemoglobin 13.6 11.1 - 15.9 g/dL   Hematocrit 42.3 34.0 - 46.6 %   MCV 84 79 - 97 fL   MCH 27.1 26.6 - 33.0 pg   MCHC 32.2 31.5 - 35.7 g/dL   RDW 14.0 11.7 - 15.4 %   Platelets 258 150 - 450 x10E3/uL   Neutrophils 69 Not Estab. %   Lymphs 25 Not Estab. %   Monocytes 4 Not Estab. %   Eos 1 Not Estab. %   Basos 1 Not Estab. %   Neutrophils Absolute 6.4 1.4 - 7.0 x10E3/uL   Lymphocytes Absolute 2.4 0.7 - 3.1 x10E3/uL   Monocytes Absolute 0.4 0.1 - 0.9 x10E3/uL   EOS (ABSOLUTE) 0.1 0.0 - 0.4 x10E3/uL   Basophils  Absolute 0.1 0.0 - 0.2 x10E3/uL   Immature Granulocytes 0 Not Estab. %   Immature Grans (Abs) 0.0 0.0 - 0.1 x10E3/uL  Comprehensive metabolic panel  Result Value Ref Range   Glucose 225 (H) 65 - 99 mg/dL   BUN 8 6 - 24 mg/dL   Creatinine, Ser 0.72 0.57 - 1.00 mg/dL   GFR calc non Af Amer 105 >59 mL/min/1.73   GFR calc Af Amer 121 >59 mL/min/1.73   BUN/Creatinine Ratio 11 9 - 23   Sodium 138 134 - 144 mmol/L   Potassium 4.3 3.5 - 5.2 mmol/L   Chloride 98 96 - 106 mmol/L   CO2 24 20 - 29 mmol/L   Calcium 9.5 8.7 - 10.2 mg/dL   Total Protein 7.6 6.0 - 8.5 g/dL   Albumin 4.4 3.8 - 4.8 g/dL   Globulin, Total 3.2 1.5 - 4.5 g/dL   Albumin/Globulin Ratio 1.4 1.2 - 2.2   Bilirubin Total 0.4 0.0 - 1.2 mg/dL   Alkaline Phosphatase 92 39 - 117 IU/L   AST 121 (H) 0 - 40 IU/L   ALT 50 (H) 0 - 32 IU/L  Lipid Panel w/o Chol/HDL Ratio  Result Value Ref Range   Cholesterol, Total 147 100 - 199 mg/dL   Triglycerides 200 (H) 0 - 149 mg/dL   HDL 40 >39 mg/dL   VLDL Cholesterol Cal 33 5 - 40 mg/dL   LDL Chol Calc (NIH) 74 0 - 99 mg/dL  TSH  Result Value Ref Range   TSH 9.950 (H) 0.450 - 4.500 uIU/mL  VITAMIN D 25 Hydroxy (Vit-D Deficiency, Fractures)  Result Value Ref Range   Vit D, 25-Hydroxy 23.1 (L) 30.0 - 100.0 ng/mL      Assessment & Plan:   Problem List Items Addressed This Visit      Endocrine   Hypothyroidism    Still feeling really tired. Will check labs and adjust as needed. Await results.      Relevant Orders   TSH   Diabetes mellitus ([place]) - Primary    Tolerating her medicine well and vision has improved. Will get her in for labs ASAP and adjust medication as needed. Await results.       Relevant Orders   Referral to Chronic Care Management Services   Hgb A1c w/o eAG   Comprehensive metabolic panel   Urine Microalbumin w/creat. ratio       Follow up plan: Return in about 3 months (around 09/23/2019).   . This visit was completed via MyChart due to the  restrictions of the COVID-19  pandemic. All issues as above were discussed and addressed. Physical exam was done as above through visual confirmation on MyChart. If it was felt that the patient should be evaluated in the office, they were directed there. The patient verbally consented to this visit. . Location of the patient: home . Location of the provider: work . Those involved with this call:  . Provider: Park Liter, DO . CMA: Lauretta Grill, RMA . Front Desk/Registration: Don Perking  . Time spent on call: 25 minutes with patient face to face via video conference. More than 50% of this time was spent in counseling and coordination of care. 40 minutes total spent in review of patient's record and preparation of their chart.

## 2019-06-30 ENCOUNTER — Encounter: Payer: Self-pay | Admitting: Family Medicine

## 2019-06-30 ENCOUNTER — Telehealth: Payer: Self-pay | Admitting: Family Medicine

## 2019-06-30 NOTE — Telephone Encounter (Signed)
lvm to make 3 mont f.u sent letter.

## 2019-06-30 NOTE — Telephone Encounter (Signed)
-----   Message from Valerie Roys, [place] sent at 06/24/2019  2:16 PM EDT ----- 3 months DM

## 2019-07-01 NOTE — Chronic Care Management (AMB) (Signed)
  Chronic Care Management   Note  07/01/2019 Name: BRITTANY OSIER MRN: [id number] DOB: [birth year]  XIN KLAWITTER is a 39 y.o. year old female who is a primary care patient of Valerie Roys, DO. I reached out to [name] by phone today in response to a referral sent by Ms. Darlin Priestly PCP, Park Liter DO     Ms. Nellums was given information about Chronic Care Management services today including:  1. CCM service includes personalized support from designated clinical staff supervised by her physician, including individualized plan of care and coordination with other care providers 2. 24/7 contact phone numbers for assistance for urgent and routine care needs. 3. Service will only be billed when office clinical staff spend 20 minutes or more in a month to coordinate care. 4. Only one practitioner may furnish and bill the service in a calendar month. 5. The patient may stop CCM services at any time (effective at the end of the month) by phone call to the office staff. 6. The patient will be responsible for cost sharing (co-pay) of up to 20% of the service fee (after annual deductible is met).  Patient agreed to services and verbal consent obtained.   Follow up plan: Telephone appointment with care management team member scheduled for:08/08/2019  Glenna Durand, [place], [place] Management ??[email or web address] ??[phone]

## 2019-07-03 ENCOUNTER — Ambulatory Visit (INDEPENDENT_AMBULATORY_CARE_PROVIDER_SITE_OTHER): Payer: Medicare Other

## 2019-07-03 DIAGNOSIS — Z0000 Encounter for general adult medical examination without abnormal findings: Secondary | ICD-10-CM | POA: Diagnosis not present

## 2019-07-03 NOTE — Progress Notes (Signed)
Subjective:   Sharon Rodriguez is a 39 y.o. female who presents for Medicare Annual (Subsequent) preventive examination.  Review of Systems:         Objective:     Vitals: There were no vitals taken for this visit.  There is no height or weight on file to calculate BMI.  Advanced Directives 01/14/2018 01/01/2018 01/01/2018 11/29/2017 07/19/2017 06/25/2017 03/19/2017  Does Patient Have a Medical Advance Directive? No No No No No No No  Would patient like information on creating a medical advance directive? No - Patient declined - No - Patient declined Yes (MAU/Ambulatory/Procedural Areas - Information given) No - Patient declined Yes (MAU/Ambulatory/Procedural Areas - Information given) -    Tobacco Social History   Tobacco Use  Smoking Status Never Smoker  Smokeless Tobacco Never Used     Counseling given: Not Answered   Clinical Intake:  Pre-visit preparation completed: Yes  Pain : No/denies pain     Nutritional Risks: None Diabetes: Yes CBG done?: No Did pt. bring in CBG monitor from home?: No  How often do you need to have someone help you when you read instructions, pamphlets, or other written materials from your doctor or pharmacy?: 1 - Never  Interpreter Needed?: No  Information entered by :: Glenna Brunkow Hil,LPN  Past Medical History:  Diagnosis Date  . ADD (attention deficit disorder)   . Anxiety   . Asthma    WELL CONTROLLED  . Biliary dyskinesia 12/27/2017  . Chronic bronchitis ([place])   . Chronic fatigue   . Chronic pain   . Depression   . Diabetes mellitus without complication ([place])   . Fibromyalgia   . GERD (gastroesophageal reflux disease)   . History of stomach ulcers 2019  . Hypothyroidism 01/27/2015  . Learning disability   . Osteoarthritis   . Rheumatoid arthritis ([place])   . Thyroid disease   . TMJ (dislocation of temporomandibular joint)    Past Surgical History:  Procedure Laterality Date  . CESAREAN SECTION     x 2  . CHOLECYSTECTOMY  N/A 01/14/2018   Procedure: LAPAROSCOPIC CHOLECYSTECTOMY;  Surgeon: Vickie Epley, MD;  Location: [facility] ORS;  Service: General;  Laterality: N/A;  . ESOPHAGOGASTRODUODENOSCOPY (EGD) WITH PROPOFOL N/A 12/07/2017   Procedure: ESOPHAGOGASTRODUODENOSCOPY (EGD) WITH PROPOFOL;  Surgeon: Jonathon Bellows, MD;  Location: [facility] ENDOSCOPY;  Service: Gastroenterology;  Laterality: N/A;   Family History  Problem Relation Age of Onset  . Diabetes Mother   . ADD / ADHD Mother   . Learning disabilities Mother   . COPD Mother   . Diabetes Father   . Hyperlipidemia Father   . Mental illness Father        Depression/anxiety  . Mental illness Sister        Depression/Anxiety  . Arthritis Brother        Rheumatoid  . Mental illness Daughter        anxiety  . Thyroid disease Daughter   . ADD / ADHD Daughter   . Allergies Daughter   . Cancer Maternal Grandmother        Skin, Lung  . Heart disease Maternal Grandfather   . Stroke Maternal Grandfather   . Heart attack Maternal Grandfather   . Arthritis Paternal Grandmother        Rheumatoid  . Dementia Paternal Grandmother   . Allergies Daughter    Social History   Socioeconomic History  . Marital status: Married    Spouse name: Not on file  .  Number of children: Not on file  . Years of education: Not on file  . Highest education level: Not on file  Occupational History  . Not on file  Tobacco Use  . Smoking status: Never Smoker  . Smokeless tobacco: Never Used  Substance and Sexual Activity  . Alcohol use: No  . Drug use: No  . Sexual activity: Not Currently    Birth control/protection: None  Other Topics Concern  . Not on file  Social History Narrative  . Not on file   Social Determinants of Health   Financial Resource Strain: High Risk  . Difficulty of Paying Living Expenses: Hard  Food Insecurity: No Food Insecurity  . Worried About Charity fundraiser in the Last Year: Never true  . Ran Out of Food in the Last Year: Never true    Transportation Needs: No Transportation Needs  . Lack of Transportation (Medical): No  . Lack of Transportation (Non-Medical): No  Physical Activity:   . Days of Exercise per Week:   . Minutes of Exercise per Session:   Stress:   . Feeling of Stress :   Social Connections:   . Frequency of Communication with Friends and Family:   . Frequency of Social Gatherings with Friends and Family:   . Attends Religious Services:   . Active Member of Clubs or Organizations:   . Attends Archivist Meetings:   Marland Kitchen Marital Status:     Outpatient Encounter Medications as of 07/03/2019  Medication Sig  . albuterol (PROVENTIL HFA;VENTOLIN HFA) 108 (90 Base) MCG/ACT inhaler Inhale 1 puff into the lungs every 6 (six) hours as needed for wheezing or shortness of breath.  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 )  . amphetamine-dextroamphetamine (ADDERALL) 30 MG tablet Take 1 tablet by mouth 2 (two) times daily.  Marland Kitchen levothyroxine (SYNTHROID) 75 MCG tablet Take 1 tablet (75 mcg total) by mouth daily. Synthroid brand name necessary  . metFORMIN (GLUCOPHAGE-XR) 500 MG 24 hr tablet TAKE 2 TABLETS BY MOUTH TWICE DAILY  . Multiple Vitamins-Minerals (MULTIVITAMIN PO) Take 1 tablet by mouth daily.  . Multiple Vitamins-Minerals (VITAMIN D3 COMPLETE) TABS   . naproxen (NAPROSYN) 500 MG tablet Take 1 tablet (500 mg total) by mouth 2 (two) times daily with a meal.  . ondansetron (ZOFRAN ODT) 8 MG disintegrating tablet Take 1 tablet (8 mg total) by mouth every 8 (eight) hours as needed for nausea or vomiting.  Marland Kitchen oxyCODONE-acetaminophen (PERCOCET) 5-325 MG tablet Take 1 tablet by mouth every 6 (six) hours as needed for severe pain. Must last 30 days.  . Vitamin D, Ergocalciferol, (DRISDOL) 1.25 MG  (50000 UNIT) CAPS capsule Take 1 capsule (50,000 Units total) by mouth once a week.  . cholestyramine (QUESTRAN) 4 g packet Take 1 packet (4 g total) by mouth 2 (two) times daily.   No facility-administered encounter medications on file as of 07/03/2019.    Activities of Daily Living In your present state of health, do you have any difficulty performing the following activities: 07/03/2019 04/03/2019  Hearing? N Y  Comment no hearing aids -  Vision? Y Y  Comment reading glasses, Dr.woodard. diabetic. -  Difficulty concentrating or making decisions? N Y  Comment usually comes bacl -  Walking or climbing stairs? Aggie Moats  Dressing or bathing? N N  Doing errands, shopping? N Y  Conservation officer, nature and eating ? N -  Using the Toilet? N -  In the past six months, have you accidently leaked urine? Y -  Comment depends -  Do you have problems with loss of bowel control? Y -  Comment depends -  Managing your Medications? N -  Managing your Finances? N -  Housekeeping or managing your Housekeeping? Y -  Comment family  helps -  Some recent data might be hidden    Patient Care Team: Valerie Roys, DO as PCP - General (Family Medicine) Lennox Laity, CRNP as Nurse Practitioner (Rheumatology) Myer Haff, MD as Referring Physician (Psychiatry) Vanita Ingles, RN as Registered Nurse (General Practice)    Assessment:   This is a routine wellness examination for [place].  Exercise Activities and Dietary recommendations Exercise limited by: None identified  Goals Addressed   None     Fall Risk: Fall Risk  07/03/2019 05/28/2018 03/28/2018 01/24/2018 01/01/2018  Falls in the past year? 0 0 0 0 No  Number falls in past yr: 0 - - - -  Injury with Fall? 0 - - - -  Risk Factor Category  - - - - -  Risk for fall due to : - - - - -  Risk for fall due to: Comment - - - - -  Follow up - - - Falls evaluation completed -    FALL RISK PREVENTION PERTAINING TO THE HOME:  Any stairs in or around  the home? No  If so, are there any without handrails? No   Home free of loose throw rugs in walkways, pet beds, electrical cords, etc? Yes  Adequate lighting in your home to reduce risk of falls? Yes   ASSISTIVE DEVICES UTILIZED TO PREVENT FALLS:  Life alert? No  Use of a cane, walker or w/c? No  Grab bars in the bathroom? No  Shower chair or bench in shower? No  Elevated toilet seat or a handicapped toilet? No   DME ORDERS:  DME order needed?  No   TIMED UP AND GO:  Unable to perform    Depression Screen PHQ 2/9 Scores 07/03/2019 04/03/2019 06/26/2018 05/28/2018  PHQ - 2 Score 3 4 4  0  PHQ- 9 Score 13 15 18  -     Cognitive Function     6CIT Screen 06/25/2017  What Year? 0 points  What month? 0 points  What time? 0 points  Count back from 20 0 points  Months in reverse 0 points  Repeat phrase 2 points  Total Score 2    Immunization History  Administered Date(s) Administered  . Influenza,inj,Quad PF,6+ Mos 01/05/2016, 12/05/2016, 01/24/2018  . Influenza-Unspecified 01/05/2016  . Tdap 01/05/2016    Qualifies for Shingles Vaccine? Shingrix not eligible.   Tdap: up to date   Flu Vaccine: due 11/2019  Pneumococcal Vaccine: due, will get next office visit.   Covid-19 Vaccine: declined   Screening Tests Health Maintenance  Topic Date Due  . PNEUMOCOCCAL POLYSACCHARIDE VACCINE AGE 87-64 HIGH RISK  Never done  . FOOT EXAM  Never done  . OPHTHALMOLOGY EXAM  Never done  . URINE MICROALBUMIN  Never done  . COVID-19 Vaccine (1) Never done  . HEMOGLOBIN A1C  10/02/2019  . INFLUENZA VACCINE  10/12/2019  . PAP SMEAR-Modifier  03/28/2020  . TETANUS/TDAP  01/04/2026  . HIV Screening  Addressed    Cancer Screenings:  Colorectal Screening: no  longer required   Mammogram: not indicated   Bone Density:  Not indicated   Lung Cancer Screening: (Low Dose CT Chest recommended if Age 58-80 years, 30 pack-year currently smoking OR have quit w/in 15years.) does not  qualify.    Additional Screening:  Hepatitis C Screening: does not qualify  Vision Screening: Recommended annual ophthalmology exams for early detection of glaucoma and other disorders of the eye. Is the patient up to date with their annual eye exam?  Yes  Who is the provider or what is the name of the office in which the pt attends annual eye exams? Dr.Woodard    Dental Screening: Recommended annual dental exams for proper oral hygiene  Community Resource Referral:  CRR required this visit?  No       Plan:  I have personally reviewed and addressed the Medicare Annual Wellness questionnaire and have noted the following in the patient's chart:  A. Medical and social history B. Use of alcohol, tobacco or illicit drugs  C. Current medications and supplements D. Functional ability and status E.  Nutritional status F.  Physical activity G. Advance directives H. List of other physicians I.  Hospitalizations, surgeries, and ER visits in previous 12 months J.  [place] such as hearing and vision if needed, cognitive and depression L. Referrals and appointments   In addition, I have reviewed and discussed with patient certain preventive protocols, quality metrics, and best practice recommendations. A written personalized care plan for preventive services as well as general preventive health recommendations were provided to patient.  Signed,    Bevelyn Ngo, LPN  [phone] Nurse Health Advisor   Nurse Notes:

## 2019-07-07 ENCOUNTER — Telehealth: Payer: Self-pay

## 2019-07-07 DIAGNOSIS — Z79899 Other long term (current) drug therapy: Secondary | ICD-10-CM | POA: Diagnosis not present

## 2019-07-07 NOTE — Telephone Encounter (Signed)
Called and informed her of information and provided her with range of self pay rates. She will talk with her husband and get back with office if needed.   Thanks!

## 2019-07-07 NOTE — Telephone Encounter (Signed)
-----   Message from Valerie Roys, [place] sent at 07/03/2019  2:59 PM EDT ----- Regarding: RE: inquire I'm starting to see new patients in about 3 months. But if he doesn't have insurance either [facility] be cheaper ----- Message ----- From: Bevelyn Ngo, LPN Sent: [phone]   2:55 PM EDT To: Valerie Roys, DO, Christan Orlando Penner Subject: inquire                                        Mrs. Hawksworth wanted to know if you were taking new patients. She is wanting to get her husband in with you and he doesn't have insurance. Also informed [facility]. Wasn't sure what the best option was for him. Would like a call back from [place] about self pay info if he is able to come to the office to be seen.   Thanks!

## 2019-07-08 DIAGNOSIS — E1139 Type 2 diabetes mellitus with other diabetic ophthalmic complication: Secondary | ICD-10-CM | POA: Diagnosis not present

## 2019-07-08 DIAGNOSIS — E039 Hypothyroidism, unspecified: Secondary | ICD-10-CM | POA: Diagnosis not present

## 2019-07-08 LAB — MICROALBUMIN, URINE: Microalb, Ur: 39

## 2019-07-08 LAB — HEMOGLOBIN A1C: Hemoglobin A1C: 7.6

## 2019-07-10 ENCOUNTER — Other Ambulatory Visit: Payer: Self-pay | Admitting: Family Medicine

## 2019-07-10 NOTE — Telephone Encounter (Signed)
Patient to follow up July per last OV

## 2019-07-14 ENCOUNTER — Telehealth: Payer: Self-pay | Admitting: Family Medicine

## 2019-07-14 ENCOUNTER — Telehealth: Payer: Self-pay | Admitting: *Deleted

## 2019-07-14 NOTE — Telephone Encounter (Signed)
  Community Resource Referral   [place] 07/14/2019  Name: Sharon Rodriguez   MRN: [id number]   DOB: 1980/03/21   AGE: 39 y.o.   GENDER: female   PCP Park Liter P, DO.   Called pt regarding Community Resource Referral for dental assistance pt stated that she had already spoken with a representative at [facility] regarding a cavity and is looking into adding a supplemental plan for [employer] for dental coverage, she will call [facility] counselor to discuss what is most affordable. Asked that I email her resources for dental in [facility] and the link to ePass to try to reach a Medicaid representative regarding her husband's application. She has not had any luck hearing back from her DSS counselor emails, calls, letters going unanswered.  Closing referral pending any other needs of patient.   [place] . [email or web address]  [phone]

## 2019-07-14 NOTE — Telephone Encounter (Signed)
Email to pt  From: Jill Alexanders [facility])  Sent: Monday, Jul 14, 2019 3:51 PM To: [email or web address] Subject: Secure: Dental Resources and ePass   Good Afternoon [name], Thank you for taking the time to speak with me today regarding community resources for Dental coverage, and Medicaid Insurance for your husband. As I mentioned on the phone the [facility] counselor will be best suited to answer your questions regarding the supplemental Medicare plans to cover dental costs Dr. Wynetta Emery is in network for [employer]. Please call [facility] at [phone].  I am sorry that you have been having difficulties getting through to your Medicaid case worker this website is available to sign up for Social Services, as your husband has a Medicaid card assigned you may want to consider signing up for an account so that you can communicate to find out more information regarding his coverage for his most recent surgery. [place]                And finally I have attached the list of dental resources in [facility].  Please let me know if you need further assistance,  [email or web address]   DT:[id number]

## 2019-07-14 NOTE — Telephone Encounter (Signed)
Voicemail left for patient to please call to over medications prior to VV appt.

## 2019-07-15 ENCOUNTER — Encounter: Payer: Self-pay | Admitting: Student in an Organized Health Care Education/Training Program

## 2019-07-15 ENCOUNTER — Other Ambulatory Visit: Payer: Self-pay

## 2019-07-15 ENCOUNTER — Ambulatory Visit
Payer: Medicare Other | Attending: Student in an Organized Health Care Education/Training Program | Admitting: Student in an Organized Health Care Education/Training Program

## 2019-07-15 DIAGNOSIS — M47816 Spondylosis without myelopathy or radiculopathy, lumbar region: Secondary | ICD-10-CM | POA: Diagnosis not present

## 2019-07-15 DIAGNOSIS — G894 Chronic pain syndrome: Secondary | ICD-10-CM | POA: Diagnosis not present

## 2019-07-15 DIAGNOSIS — Z79891 Long term (current) use of opiate analgesic: Secondary | ICD-10-CM

## 2019-07-15 DIAGNOSIS — M797 Fibromyalgia: Secondary | ICD-10-CM | POA: Diagnosis not present

## 2019-07-15 DIAGNOSIS — M26629 Arthralgia of temporomandibular joint, unspecified side: Secondary | ICD-10-CM

## 2019-07-15 MED ORDER — OXYCODONE-ACETAMINOPHEN 5-325 MG PO TABS: 1.0000 | ORAL_TABLET | Freq: Four times a day (QID) | ORAL | 0 refills | Status: AC | PRN

## 2019-07-15 MED ORDER — OXYCODONE-ACETAMINOPHEN 5-325 MG PO TABS: 1.0000 | ORAL_TABLET | Freq: Four times a day (QID) | ORAL | 0 refills | Status: DC | PRN

## 2019-07-15 NOTE — Progress Notes (Signed)
Patient: Sharon Rodriguez  Service Category: E/M  Provider: Gillis Santa, MD  DOB: May 21, 1980  DOS: 07/15/2019  Location: Office  MRN: [id number]  Setting: Ambulatory outpatient  Referring Provider: Valerie Roys, DO  Type: Established Patient  Specialty: Interventional Pain Management  PCP: Valerie Roys, DO  Location: Home  Delivery: TeleHealth     Virtual Encounter - Pain Management PROVIDER NOTE: Information contained herein reflects review and annotations entered in association with encounter. Interpretation of such information and data should be left to medically-trained personnel. Information provided to patient can be located elsewhere in the medical record under "Patient Instructions". Document created using STT-dictation technology, any transcriptional errors that may result from process are unintentional.    Contact & Pharmacy Preferred: [phone] Home: [phone] (home) Mobile: There is no such number on file (mobile). E-mail: [email or web address]  [place], [place] [place] [address] Phone: [phone] Fax: [phone]  [facility], [place] - [address] [place] [address] Phone: [phone] Fax: [phone]   Pre-screening  Sharon Rodriguez offered "in-person" vs "virtual" encounter. She indicated preferring virtual for this encounter.   Reason COVID-19*  Social distancing based on CDC and AMA recommendations.   I contacted Sharon Rodriguez on 07/15/2019 via televisit.      I clearly identified myself as Gillis Santa, MD. I verified that I was speaking with the correct person using two identifiers (Name: Sharon Rodriguez, and date of birth: [birth year]).  Consent I sought verbal advanced consent from Sharon Rodriguez for virtual visit interactions. I informed Ms. Benney of possible security and privacy concerns, risks, and limitations associated with providing "not-in-person"  medical evaluation and management services. I also informed Sharon Rodriguez of the availability of "in-person" appointments. Finally, I informed her that there would be a charge for the virtual visit and that she could be  personally, fully or partially, financially responsible for it. Sharon Rodriguez expressed understanding and agreed to proceed.   Historic Elements   Sharon Rodriguez is a 39 y.o. year old, female patient evaluated today after her last contact with our practice on 07/14/2019. Sharon Rodriguez  has a past medical history of ADD (attention deficit disorder), Anxiety, Asthma, Biliary dyskinesia (12/27/2017), Chronic bronchitis ([place]), Chronic fatigue, Chronic pain, Depression, Diabetes mellitus without complication ([place]), Fibromyalgia, GERD (gastroesophageal reflux disease), History of stomach ulcers (2019), Hypothyroidism (01/27/2015), Learning disability, Osteoarthritis, Rheumatoid arthritis ([place]), Thyroid disease, and TMJ (dislocation of temporomandibular joint). She also  has a past surgical history that includes Cesarean section; Esophagogastroduodenoscopy (egd) with propofol (N/A, 12/07/2017); and Cholecystectomy (N/A, 01/14/2018). Sharon Rodriguez has a current medication list which includes the following prescription(s): albuterol, albuterol, amphetamine-dextroamphetamine, cholestyramine, levothyroxine, metformin, multiple vitamin, vitamin d3 complete, naproxen, ondansetron, [START ON 07/22/2019] oxycodone-acetaminophen, [START ON 08/21/2019] oxycodone-acetaminophen, [START ON 09/20/2019] oxycodone-acetaminophen, and vitamin d (ergocalciferol). She  reports that she has never smoked. She has never used smokeless tobacco. She reports that she does not drink alcohol or use drugs. Sharon Rodriguez is allergic to ibuprofen and promethazine.   HPI  Today, she is being contacted for medication management.   No change in medical history since last visit.  Patient's pain is at baseline.  Patient continues multimodal pain regimen  as prescribed.  States that it provides pain relief and improvement in functional status.  Pharmacotherapy Assessment  Analgesic: 06/23/2019  1   04/17/2019  Oxycodone-Acetaminophen 5-325  120.00  30 Bi Lat  [id number]   212 (3732)   0  30.00 MME  Comm Ins   [place]     Monitoring: [place] PMP: PDMP reviewed during this encounter.       Pharmacotherapy: No side-effects or adverse reactions reported. Compliance: No problems identified. Effectiveness: Clinically acceptable. Plan: Refer to "POC".  UDS:  Summary  Date Value Ref Range Status  03/18/2019 Note  Final    Comment:    ==================================================================== [employer] Select 13 (MW) ==================================================================== Test                             Result       Flag       Units Drug Present and Declared for Prescription Verification   Oxycodone                      2047         EXPECTED   ng/mg creat   Oxymorphone                    998          EXPECTED   ng/mg creat   Noroxycodone                   1413         EXPECTED   ng/mg creat   Noroxymorphone                 147          EXPECTED   ng/mg creat    Sources of oxycodone are scheduled prescription medications.    Oxymorphone, noroxycodone, and noroxymorphone are expected    metabolites of oxycodone. Oxymorphone is also available as a    scheduled prescription medication. Drug Absent but Declared for Prescription Verification   Amphetamine                    Not Detected UNEXPECTED ng/mg creat ==================================================================== Test                      Result    Flag   Units      Ref Range   Creatinine              338              mg/dL      >=20 ==================================================================== Declared Medications:  The flagging and interpretation on this report are based on the  following declared medications.  Unexpected results may arise from   inaccuracies in the declared medications.  **Note: The testing scope of this panel includes these medications:  Amphetamine (Adderall)  Oxycodone  **Note: The testing scope of this panel does not include the  following reported medications:  Acetaminophen  Albuterol  Levothyroxine (Synthroid)  Multivitamin  Vitamin D2 (Drisdol) ==================================================================== For clinical consultation, please call [phone]. ====================================================================    Laboratory Chemistry Profile   Renal Lab Results  Component Value Date   BUN 8 04/04/2019   CREATININE 0.72 04/04/2019   BCR 11 04/04/2019   GFRAA 121 04/04/2019   GFRNONAA 105 04/04/2019     Hepatic Lab Results  Component Value Date   AST 121 (H) 04/04/2019   ALT 50 (H) 04/04/2019   ALBUMIN 4.4 04/04/2019   ALKPHOS 92 04/04/2019   LIPASE 43 04/10/2017     Electrolytes Lab Results  Component Value Date   NA  138 04/04/2019   K 4.3 04/04/2019   CL 98 04/04/2019   CALCIUM 9.5 04/04/2019     Bone Lab Results  Component Value Date   VD25OH 23.1 (L) 04/04/2019     Inflammation (CRP: Acute Phase) (ESR: Chronic Phase) No results found for: CRP, ESRSEDRATE, LATICACIDVEN     Note: Above Lab results reviewed.  Imaging  MR Brain W Wo Contrast CLINICAL DATA:  Vertigo bilateral tinnitus. Slurred speech and double vision. Migraine headache history.  EXAM: MRI HEAD WITHOUT AND WITH CONTRAST  TECHNIQUE: Multiplanar, multiecho pulse sequences of the brain and surrounding structures were obtained without and with intravenous contrast.  CONTRAST:  55m GADAVIST GADOBUTROL 1 MMOL/ML IV SOLN  COMPARISON:  None.  FINDINGS: Brain: IAC protocol with thin sections through the posterior fossa. Seventh and eighth cranial nerves normal. Negative for vestibular schwannoma. Basilar cisterns normal. Brainstem and cerebellum normal. Mastoid sinus clear  bilaterally. Normal enhancement in the temporal bone bilaterally.  Ventricle size normal. Negative for infarct, hemorrhage, mass. Normal white matter.  Vascular: Normal arterial flow voids.  Skull and upper cervical spine: No focal skeletal lesion.  Sinuses/Orbits: Negative  Other: None  IMPRESSION: Negative MRI of the brain with contrast with special attention to the posterior fossa.  Electronically Signed   By: CFranchot GalloM.D.   On: 04/30/2019 10:08  Assessment  The primary encounter diagnosis was Spondylosis without myelopathy or radiculopathy, lumbar region. Diagnoses of Chronic pain syndrome, Long term current use of opiate analgesic, Fibromyalgia, and TMJ pain dysfunction syndrome were also pertinent to this visit.  Plan of Care  Ms. JSHAQUETA CASADYhas a current medication list which includes the following long-term medication(s): albuterol, albuterol, cholestyramine, levothyroxine, and metformin.  Continue Naproxen prn  Pharmacotherapy (Medications Ordered): Meds ordered this encounter  Medications  . oxyCODONE-acetaminophen (PERCOCET) 5-325 MG tablet    Sig: Take 1 tablet by mouth every 6 (six) hours as needed for severe pain. Must last 30 days.    Dispense:  120 tablet    Refill:  0    [place] STOP ACT - Not applicable. Fill one day early if pharmacy is closed on scheduled refill date.  .Marland KitchenoxyCODONE-acetaminophen (PERCOCET) 5-325 MG tablet    Sig: Take 1 tablet by mouth every 6 (six) hours as needed for severe pain. Must last 30 days.    Dispense:  120 tablet    Refill:  0    [place] STOP ACT - Not applicable. Fill one day early if pharmacy is closed on scheduled refill date.  .Marland KitchenoxyCODONE-acetaminophen (PERCOCET) 5-325 MG tablet    Sig: Take 1 tablet by mouth every 6 (six) hours as needed for severe pain. Must last 30 days.    Dispense:  120 tablet    Refill:  0    [place] STOP ACT - Not applicable. Fill one day early if pharmacy is closed on scheduled refill date.    Follow-up plan:   No follow-ups on file.    Recent Visits Date Type Provider Dept  04/17/19 Office Visit LGillis Santa MD [facility]  Showing recent visits within past 90 days and meeting all other requirements   Future Appointments No visits were found meeting these conditions.  Showing future appointments within next 90 days and meeting all other requirements   I discussed the assessment and treatment plan with the patient. The patient was provided an opportunity to ask questions and all were answered. The patient agreed with the plan and demonstrated an understanding  of the instructions.  Patient advised to call back or seek an in-person evaluation if the symptoms or condition worsens.  Duration of encounter: 30 minutes.  Note by: Gillis Santa, MD Date: 07/15/2019; Time: 12:55 PM

## 2019-08-08 ENCOUNTER — Ambulatory Visit (INDEPENDENT_AMBULATORY_CARE_PROVIDER_SITE_OTHER): Payer: Medicare Other | Admitting: General Practice

## 2019-08-08 ENCOUNTER — Telehealth: Payer: Medicare Other | Admitting: General Practice

## 2019-08-08 DIAGNOSIS — E1139 Type 2 diabetes mellitus with other diabetic ophthalmic complication: Secondary | ICD-10-CM

## 2019-08-08 DIAGNOSIS — F419 Anxiety disorder, unspecified: Secondary | ICD-10-CM

## 2019-08-08 DIAGNOSIS — E039 Hypothyroidism, unspecified: Secondary | ICD-10-CM | POA: Diagnosis not present

## 2019-08-08 DIAGNOSIS — F339 Major depressive disorder, recurrent, unspecified: Secondary | ICD-10-CM | POA: Diagnosis not present

## 2019-08-08 DIAGNOSIS — J42 Unspecified chronic bronchitis: Secondary | ICD-10-CM

## 2019-08-08 NOTE — Patient Instructions (Signed)
Visit Information  Goals Addressed            This Visit's Progress    RNCM: Pt- "I don't have a way to check my blood sugar" (pt-stated)       CARE PLAN ENTRY (see longtitudinal plan of care for additional care plan information)  Objective:  Lab Results  Component Value Date   HGBA1C 9.4 (H) 04/04/2019    Lab Results  Component Value Date   CREATININE 0.72 04/04/2019   CREATININE 0.86 03/20/2013     No results found for: EGFR  Current Barriers:   Knowledge Deficits related to basic Diabetes pathophysiology and self care/management  Knowledge Deficits related to medications used for management of diabetes  Does not have glucometer to monitor blood sugar  Limited Social Support  New labs in April of 2021 at [place] at the [facility] [address] in [place] with no results available. Call made to [place] by [facility] without getting any results to test  No glucose meter to check blood sugars  Unable to independently manage DM as evidence of last hemoglobin A1C in January 9.4. Had labwork in April but can not find the results in the system. Will collaborate with pcp.  Case Manager Clinical Goal(s):  Over the next 120 days, patient will demonstrate improved adherence to prescribed treatment plan for diabetes self care/management as evidenced by:   daily monitoring and recording of CBG   adherence to ADA/ carb modified diet  exercise 5 days/week  adherence to prescribed medication regimen  Interventions:   Provided education to patient about basic DM disease process  Reviewed medications with patient and discussed importance of medication adherence  Discussed plans with patient for ongoing care management follow up and provided patient with direct contact information for care management team  Provided patient with written educational materials related to hypo and hyperglycemia and importance of correct treatment.  Will send information by myChart and the EMMI  system on hypoglycemia/hyperglycemia and other information on diabetes.   Reviewed scheduled/upcoming provider appointments including: no appointments with the pcp currently will collaborate with pcp and see about appointment needs  Advised patient, providing education and rationale, to check cbg daily and record, calling pcp for findings outside established parameters.  Per the patient she does not have a machine to check blood sugars daily. The patient is willing to check her blood sugars. Ask for a script to be sent to [facility] for obtaining a glucose meter.   Referral to the [facility] team pharmacist for help with medication management and support for DM education.   Patient Self Care Activities:   UNABLE to independently manage DM as evidence of elevated hemoglobin A1C  Checks blood sugars as prescribed and utilize hyper and hypoglycemia protocol as needed  Adheres to prescribed ADA/carb modified  Initial goal documentation      RNCM: Pt-"I feel tired all the time" (pt-stated)       CARE PLAN ENTRY (see longtitudinal plan of care for additional care plan information)  Current Barriers:   Chronic Disease Management support, education, and care coordination needs related to Anxiety, Depression, Pulmonary Disease, and Hypothyroidism   Clinical Goal(s) related to Anxiety, Depression, Pulmonary Disease, and Hypothyroidism :  Over the next 120 days, patient will:   Work with the care management team to address educational, disease management, and care coordination needs   Begin or continue self health monitoring activities as directed today  ADA diet and use coping mechanisms for increased anxiety and  depression.   Call provider office for new or worsened signs and symptoms Shortness of breath and New or worsened symptom related to chronic conditions  Call care management team with questions or concerns  Verbalize basic understanding of patient centered plan of care established  today  Interventions related to Anxiety, Depression, Pulmonary Disease, and hypothyroidism :   Evaluation of current treatment plans and patient's adherence to plan as established by provider  Assessed patient understanding of disease states  Assessed patient's education and care coordination needs  Provided disease specific education to patient   Collaborated with appropriate clinical care team members regarding patient needs  Referral to LCSW for support and help with depression and anxiety.  Patient Self Care Activities related to Anxiety, Depression, Pulmonary Disease, and Hypothyroidism :   Patient is unable to independently self-manage chronic health conditions  Initial goal documentation        Sharon Rodriguez was given information about Chronic Care Management services today including:  1. CCM service includes personalized support from designated clinical staff supervised by her physician, including individualized plan of care and coordination with other care providers 2. 24/7 contact phone numbers for assistance for urgent and routine care needs. 3. Service will only be billed when office clinical staff spend 20 minutes or more in a month to coordinate care. 4. Only one practitioner may furnish and bill the service in a calendar month. 5. The patient may stop CCM services at any time (effective at the end of the month) by phone call to the office staff. 6. The patient will be responsible for cost sharing (co-pay) of up to 20% of the service fee (after annual deductible is met).  Patient agreed to services and verbal consent obtained.   Patient verbalizes understanding of instructions provided today.   The care management team will reach out to the patient again over the next 60 days.   Noreene Larsson RN, MSN, CCM Community Care Coordinator [facility] Mobile: [phone]   Diabetes Mellitus and Nutrition, Adult When you  have diabetes (diabetes mellitus), it is very important to have healthy eating habits because your blood sugar (glucose) levels are greatly affected by what you eat and drink. Eating healthy foods in the appropriate amounts, at about the same times every day, can help you:  Control your blood glucose.  Lower your risk of heart disease.  Improve your blood pressure.  Reach or maintain a healthy weight. Every person with diabetes is different, and each person has different needs for a meal plan. Your health care provider may recommend that you work with a diet and nutrition specialist (dietitian) to make a meal plan that is best for you. Your meal plan may vary depending on factors such as:  The calories you need.  The medicines you take.  Your weight.  Your blood glucose, blood pressure, and cholesterol levels.  Your activity level.  Other health conditions you have, such as heart or kidney disease. How do carbohydrates affect me? Carbohydrates, also called carbs, affect your blood glucose level more than any other type of food. Eating carbs naturally raises the amount of glucose in your blood. Carb counting is a method for keeping track of how many carbs you eat. Counting carbs is important to keep your blood glucose at a healthy level, especially if you use insulin or take certain oral diabetes medicines. It is important to know how many carbs you can safely have in each  meal. This is different for every person. Your dietitian can help you calculate how many carbs you should have at each meal and for each snack. Foods that contain carbs include:  Bread, cereal, rice, pasta, and crackers.  Potatoes and corn.  Peas, beans, and lentils.  Milk and yogurt.  Fruit and juice.  Desserts, such as cakes, cookies, ice cream, and candy. How does alcohol affect me? Alcohol can cause a sudden decrease in blood glucose (hypoglycemia), especially if you use insulin or take certain oral  diabetes medicines. Hypoglycemia can be a life-threatening condition. Symptoms of hypoglycemia (sleepiness, dizziness, and confusion) are similar to symptoms of having too much alcohol. If your health care provider says that alcohol is safe for you, follow these guidelines:  Limit alcohol intake to no more than 1 drink per day for nonpregnant women and 2 drinks per day for men. One drink equals 12 oz of beer, 5 oz of wine, or 1 oz of hard liquor.  Do not drink on an empty stomach.  Keep yourself hydrated with water, diet soda, or unsweetened iced tea.  Keep in mind that regular soda, juice, and other mixers may contain a lot of sugar and must be counted as carbs. What are tips for following this plan?  Reading food labels  Start by checking the serving size on the "Nutrition Facts" label of packaged foods and drinks. The amount of calories, carbs, fats, and other nutrients listed on the label is based on one serving of the item. Many items contain more than one serving per package.  Check the total grams (g) of carbs in one serving. You can calculate the number of servings of carbs in one serving by dividing the total carbs by 15. For example, if a food has 30 g of total carbs, it would be equal to 2 servings of carbs.  Check the number of grams (g) of saturated and trans fats in one serving. Choose foods that have low or no amount of these fats.  Check the number of milligrams (mg) of salt (sodium) in one serving. Most people should limit total sodium intake to less than 2,300 mg per day.  Always check the nutrition information of foods labeled as "low-fat" or "nonfat". These foods may be higher in added sugar or refined carbs and should be avoided.  Talk to your dietitian to identify your daily goals for nutrients listed on the label. Shopping  Avoid buying canned, premade, or processed foods. These foods tend to be high in fat, sodium, and added sugar.  Shop around the outside edge  of the grocery store. This includes fresh fruits and vegetables, bulk grains, fresh meats, and fresh dairy. Cooking  Use low-heat cooking methods, such as baking, instead of high-heat cooking methods like deep frying.  Cook using healthy oils, such as olive, canola, or sunflower oil.  Avoid cooking with butter, cream, or high-fat meats. Meal planning  Eat meals and snacks regularly, preferably at the same times every day. Avoid going long periods of time without eating.  Eat foods high in fiber, such as fresh fruits, vegetables, beans, and whole grains. Talk to your dietitian about how many servings of carbs you can eat at each meal.  Eat 4-6 ounces (oz) of lean protein each day, such as lean meat, chicken, fish, eggs, or tofu. One oz of lean protein is equal to: ? 1 oz of meat, chicken, or fish. ? 1 egg. ?  cup of tofu.  Eat some foods  each day that contain healthy fats, such as avocado, nuts, seeds, and fish. Lifestyle  Check your blood glucose regularly.  Exercise regularly as told by your health care provider. This may include: ? 150 minutes of moderate-intensity or vigorous-intensity exercise each week. This could be brisk walking, biking, or water aerobics. ? Stretching and doing strength exercises, such as yoga or weightlifting, at least 2 times a week.  Take medicines as told by your health care provider.  Do not use any products that contain nicotine or tobacco, such as cigarettes and e-cigarettes. If you need help quitting, ask your health care provider.  Work with a Social worker or diabetes educator to identify strategies to manage stress and any emotional and social challenges. Questions to ask a health care provider  Do I need to meet with a diabetes educator?  Do I need to meet with a dietitian?  What number can I call if I have questions?  When are the best times to check my blood glucose? Where to find more information:  American Diabetes Association:  diabetes.org  Academy of Nutrition and Dietetics: www.eatright.[employer] of Diabetes and Digestive and Kidney Diseases (NIH): [employer] Summary  A healthy meal plan will help you control your blood glucose and maintain a healthy lifestyle.  Working with a diet and nutrition specialist (dietitian) can help you make a meal plan that is best for you.  Keep in mind that carbohydrates (carbs) and alcohol have immediate effects on your blood glucose levels. It is important to count carbs and to use alcohol carefully. This information is not intended to replace advice given to you by your health care provider. Make sure you discuss any questions you have with your health care provider. Document Revised: 02/09/2017 Document Reviewed: 04/03/2016 Elsevier Patient Education  [place].  Hypoglycemia Hypoglycemia is when the sugar (glucose) level in your blood is too low. Signs of low blood sugar may include:  Feeling: ? Hungry. ? Worried or nervous (anxious). ? Sweaty and clammy. ? Confused. ? Dizzy. ? Sleepy. ? Sick to your stomach (nauseous).  Having: ? A fast heartbeat. ? A headache. ? A change in your vision. ? Tingling or no feeling (numbness) around your mouth, lips, or tongue. ? Jerky movements that you cannot control (seizure).  Having trouble with: ? Moving (coordination). ? Sleeping. ? Passing out (fainting). ? Getting upset easily (irritability). Low blood sugar can happen to people who have diabetes and people who do not have diabetes. Low blood sugar can happen quickly, and it can be an emergency. Treating low blood sugar Low blood sugar is often treated by eating or drinking something sugary right away, such as:  Fruit juice, 4-6 oz (120-150 mL).  Regular soda (not diet soda), 4-6 oz (120-150 mL).  Low-fat milk, 4 oz (120 mL).  Several pieces of hard candy.  Sugar or honey, 1 Tbsp (15 mL). Treating low blood sugar if you have  diabetes If you can think clearly and swallow safely, follow the 15:15 rule:  Take 15 grams of a fast-acting carb (carbohydrate). Talk with your doctor about how much you should take.  Always keep a source of fast-acting carb with you, such as: ? Sugar tablets (glucose pills). Take 3-4 pills. ? 6-8 pieces of hard candy. ? 4-6 oz (120-150 mL) of fruit juice. ? 4-6 oz (120-150 mL) of regular (not diet) soda. ? 1 Tbsp (15 mL) honey or sugar.  Check your blood sugar 15 minutes after you take  the carb.  If your blood sugar is still at or below 70 mg/dL (3.9 mmol/L), take 15 grams of a carb again.  If your blood sugar does not go above 70 mg/dL (3.9 mmol/L) after 3 tries, get help right away.  After your blood sugar goes back to normal, eat a meal or a snack within 1 hour.  Treating very low blood sugar If your blood sugar is at or below 54 mg/dL (3 mmol/L), you have very low blood sugar (severe hypoglycemia). This may also cause:  Passing out.  Jerky movements you cannot control (seizure).  Losing consciousness (coma). This is an emergency. Do not wait to see if the symptoms will go away. Get medical help right away. Call your local emergency services (911 in the [place] Do not drive yourself to the hospital. If you have very low blood sugar and you cannot eat or drink, you may need a glucagon shot (injection). A family member or friend should learn how to check your blood sugar and how to give you a glucagon shot. Ask your doctor if you need to have a glucagon shot kit at home. Follow these instructions at home: General instructions  Take over-the-counter and prescription medicines only as told by your doctor.  Stay aware of your blood sugar as told by your doctor.  Limit alcohol intake to no more than 1 drink a day for nonpregnant women and 2 drinks a day for men. One drink equals 12 oz of beer (355 mL), 5 oz of wine (148 mL), or 1 oz of hard liquor (44 mL).  Keep all follow-up  visits as told by your doctor. This is important. If you have diabetes:   Follow your diabetes care plan as told by your doctor. Make sure you: ? Know the signs of low blood sugar. ? Take your medicines as told. ? Follow your exercise and meal plan. ? Eat on time. Do not skip meals. ? Check your blood sugar as often as told by your doctor. Always check it before and after exercise. ? Follow your sick day plan when you cannot eat or drink normally. Make this plan ahead of time with your doctor.  Share your diabetes care plan with: ? Your work or school. ? People you live with.  Check your pee (urine) for ketones: ? When you are sick. ? As told by your doctor.  Carry a card or wear jewelry that says you have diabetes. Contact a doctor if:  You have trouble keeping your blood sugar in your target range.  You have low blood sugar often. Get help right away if:  You still have symptoms after you eat or drink something sugary.  Your blood sugar is at or below 54 mg/dL (3 mmol/L).  You have jerky movements that you cannot control.  You pass out. These symptoms may be an emergency. Do not wait to see if the symptoms will go away. Get medical help right away. Call your local emergency services (911 in the [place] Do not drive yourself to the hospital. Summary  Hypoglycemia happens when the level of sugar (glucose) in your blood is too low.  Low blood sugar can happen to people who have diabetes and people who do not have diabetes. Low blood sugar can happen quickly, and it can be an emergency.  Make sure you know the signs of low blood sugar and know how to treat it.  Always keep a source of sugar (fast-acting carb) with you to  treat low blood sugar. This information is not intended to replace advice given to you by your health care provider. Make sure you discuss any questions you have with your health care provider. Document Revised: 06/20/2018 Document Reviewed:  04/02/2015 Elsevier Patient Education  [place].  Hyperglycemia Hyperglycemia is when the sugar (glucose) level in your blood is too high. It may not cause symptoms. If you do have symptoms, they may include warning signs, such as:  Feeling more thirsty than normal.  Hunger.  Feeling tired.  Needing to pee (urinate) more than normal.  Blurry eyesight (vision). You may get other symptoms as it gets worse, such as:  Dry mouth.  Not being hungry (loss of appetite).  Fruity-smelling breath.  Weakness.  Weight gain or loss that is not planned. Weight loss may be fast.  A tingling or numb feeling in your hands or feet.  Headache.  Skin that does not bounce back quickly when it is lightly pinched and released (poor skin turgor).  Pain in your belly (abdomen).  Cuts or bruises that heal slowly. High blood sugar can happen to people who do or do not have diabetes. High blood sugar can happen slowly or quickly, and it can be an emergency. Follow these instructions at home: General instructions  Take over-the-counter and prescription medicines only as told by your doctor.  Do not use products that contain nicotine or tobacco, such as cigarettes and e-cigarettes. If you need help quitting, ask your doctor.  Limit alcohol intake to no more than 1 drink per day for nonpregnant women and 2 drinks per day for men. One drink equals 12 oz of beer, 5 oz of wine, or 1 oz of hard liquor.  Manage stress. If you need help with this, ask your doctor.  Keep all follow-up visits as told by your doctor. This is important. Eating and drinking   Stay at a healthy weight.  Exercise regularly, as told by your doctor.  Drink enough fluid, especially when you: ? Exercise. ? Get sick. ? Are in hot temperatures.  Eat healthy foods, such as: ? Low-fat (lean) proteins. ? Complex carbs (complex carbohydrates), such as whole wheat bread or brown rice. ? Fresh fruits and  vegetables. ? Low-fat dairy products. ? Healthy fats.  Drink enough fluid to keep your pee (urine) clear or pale yellow. If you have diabetes:   Make sure you know the symptoms of hyperglycemia.  Follow your diabetes management plan, as told by your doctor. Make sure you: ? Take insulin and medicines as told. ? Follow your exercise plan. ? Follow your meal plan. Eat on time. Do not skip meals. ? Check your blood sugar as often as told. Make sure to check before and after exercise. If you exercise longer or in a different way than you normally do, check your blood sugar more often. ? Follow your sick day plan whenever you cannot eat or drink normally. Make this plan ahead of time with your doctor.  Share your diabetes management plan with people in your workplace, school, and household.  Check your urine for ketones when you are ill and as told by your doctor.  Carry a card or wear jewelry that says that you have diabetes. Contact a doctor if:  Your blood sugar level is higher than 240 mg/dL (13.3 mmol/L) for 2 days in a row.  You have problems keeping your blood sugar in your target range.  High blood sugar happens often for you. Get  help right away if:  You have trouble breathing.  You have a change in how you think, feel, or act (mental status).  You feel sick to your stomach (nauseous), and that feeling does not go away.  You cannot stop throwing up (vomiting). These symptoms may be an emergency. Do not wait to see if the symptoms will go away. Get medical help right away. Call your local emergency services (911 in the [place] Do not drive yourself to the hospital. Summary  Hyperglycemia is when the sugar (glucose) level in your blood is too high.  High blood sugar can happen to people who do or do not have diabetes.  Make sure you drink enough fluids, eat healthy foods, and exercise regularly.  Contact your doctor if you have problems keeping your blood sugar in your  target range. This information is not intended to replace advice given to you by your health care provider. Make sure you discuss any questions you have with your health care provider. Document Revised: 11/15/2015 Document Reviewed: 11/15/2015 Elsevier Patient Education  2020 [place] for Diabetes Mellitus, Adult  Carbohydrate counting is a method of keeping track of how many carbohydrates you eat. Eating carbohydrates naturally increases the amount of sugar (glucose) in the blood. Counting how many carbohydrates you eat helps keep your blood glucose within normal limits, which helps you manage your diabetes (diabetes mellitus). It is important to know how many carbohydrates you can safely have in each meal. This is different for every person. A diet and nutrition specialist (registered dietitian) can help you make a meal plan and calculate how many carbohydrates you should have at each meal and snack. Carbohydrates are found in the following foods:  Grains, such as breads and cereals.  Dried beans and soy products.  Starchy vegetables, such as potatoes, peas, and corn.  Fruit and fruit juices.  Milk and yogurt.  Sweets and snack foods, such as cake, cookies, candy, chips, and soft drinks. How do I count carbohydrates? There are two ways to count carbohydrates in food. You can use either of the methods or a combination of both. Reading "Nutrition Facts" on packaged food The "Nutrition Facts" list is included on the labels of almost all packaged foods and beverages in the [place] It includes:  The serving size.  Information about nutrients in each serving, including the grams (g) of carbohydrate per serving. To use the Nutrition Facts":  Decide how many servings you will have.  Multiply the number of servings by the number of carbohydrates per serving.  The resulting number is the total amount of carbohydrates that you will be having. Learning standard  serving sizes of other foods When you eat carbohydrate foods that are not packaged or do not include "Nutrition Facts" on the label, you need to measure the servings in order to count the amount of carbohydrates:  Measure the foods that you will eat with a food scale or measuring cup, if needed.  Decide how many standard-size servings you will eat.  Multiply the number of servings by 15. Most carbohydrate-rich foods have about 15 g of carbohydrates per serving. ? For example, if you eat 8 oz (170 g) of strawberries, you will have eaten 2 servings and 30 g of carbohydrates (2 servings x 15 g = 30 g).  For foods that have more than one food mixed, such as soups and casseroles, you must count the carbohydrates in each food that is included. The following list contains  standard serving sizes of common carbohydrate-rich foods. Each of these servings has about 15 g of carbohydrates:   hamburger bun or  English muffin.   oz (15 mL) syrup.   oz (14 g) jelly.  1 slice of bread.  1 six-inch tortilla.  3 oz (85 g) cooked rice or pasta.  4 oz (113 g) cooked dried beans.  4 oz (113 g) starchy vegetable, such as peas, corn, or potatoes.  4 oz (113 g) hot cereal.  4 oz (113 g) mashed potatoes or  of a large baked potato.  4 oz (113 g) canned or frozen fruit.  4 oz (120 mL) fruit juice.  4-6 crackers.  6 chicken nuggets.  6 oz (170 g) unsweetened dry cereal.  6 oz (170 g) plain fat-free yogurt or yogurt sweetened with artificial sweeteners.  8 oz (240 mL) milk.  8 oz (170 g) fresh fruit or one small piece of fruit.  24 oz (680 g) popped popcorn. Example of carbohydrate counting Sample meal  3 oz (85 g) chicken breast.  6 oz (170 g) brown rice.  4 oz (113 g) corn.  8 oz (240 mL) milk.  8 oz (170 g) strawberries with sugar-free whipped topping. Carbohydrate calculation 1. Identify the foods that contain  carbohydrates: ? Rice. ? Corn. ? Milk. ? Strawberries. 2. Calculate how many servings you have of each food: ? 2 servings rice. ? 1 serving corn. ? 1 serving milk. ? 1 serving strawberries. 3. Multiply each number of servings by 15 g: ? 2 servings rice x 15 g = 30 g. ? 1 serving corn x 15 g = 15 g. ? 1 serving milk x 15 g = 15 g. ? 1 serving strawberries x 15 g = 15 g. 4. Add together all of the amounts to find the total grams of carbohydrates eaten: ? 30 g + 15 g + 15 g + 15 g = 75 g of carbohydrates total. Summary  Carbohydrate counting is a method of keeping track of how many carbohydrates you eat.  Eating carbohydrates naturally increases the amount of sugar (glucose) in the blood.  Counting how many carbohydrates you eat helps keep your blood glucose within normal limits, which helps you manage your diabetes.  A diet and nutrition specialist (registered dietitian) can help you make a meal plan and calculate how many carbohydrates you should have at each meal and snack. This information is not intended to replace advice given to you by your health care provider. Make sure you discuss any questions you have with your health care provider. Document Revised: 09/21/2016 Document Reviewed: 08/11/2015 Elsevier Patient Education  [place].

## 2019-08-08 NOTE — Chronic Care Management (AMB) (Signed)
Chronic Care Management   Initial Visit Note  08/08/2019 Name: Sharon Rodriguez MRN: [id number] DOB: [birth year]  Referred by: Valerie Roys, DO Reason for referral : Chronic Care Management (Initial Outreach: DM/Hypothyroidism/Anxiety/Depression/Chronic pain- referral from MD)   SHAKEEMA LIPPMAN is a 39 y.o. year old female who is a primary care patient of Valerie Roys, DO. The CCM team was consulted for assistance with chronic disease management and care coordination needs related to DMII, Anxiety, Depression, Pulmonary Disease and Hypothyroidism   Review of patient status, including review of consultants reports, relevant laboratory and other test results, and collaboration with appropriate care team members and the patient's provider was performed as part of comprehensive patient evaluation and provision of chronic care management services.    SDOH (Social Determinants of Health) assessments performed: Yes See Care Plan activities for detailed interventions related to SDOH  SDOH Interventions     Most Recent Value  SDOH Interventions  Depression Interventions/Treatment   Medication [LCSW]       Medications: Outpatient Encounter Medications as of 08/08/2019  Medication Sig   albuterol (PROVENTIL HFA;VENTOLIN HFA) 108 (90 Base) MCG/ACT inhaler Inhale 1 puff into the lungs every 6 (six) hours as needed for wheezing or shortness of breath.   albuterol (PROVENTIL) (2.5 MG/3ML) 0.083% nebulizer solution Frequency:Q4HPRN   Dosage:0.0     Instructions:  Note:Dose: 1  Dx:J42 (Patient taking differently: Take 2.5 mg by nebulization every 4 (four) hours as needed for wheezing or shortness of breath. )   amphetamine-dextroamphetamine (ADDERALL) 30 MG tablet Take 1 tablet by mouth 2 (two) times daily.   cholestyramine (QUESTRAN) 4 g packet Take 1 packet (4 g total) by mouth 2 (two) times daily.   levothyroxine (SYNTHROID) 75 MCG tablet Take 1 tablet (75 mcg total) by mouth daily.  Synthroid brand name necessary   metFORMIN (GLUCOPHAGE-XR) 500 MG 24 hr tablet TAKE 2 TABLETS BY MOUTH TWICE DAILY   Multiple Vitamins-Minerals (MULTIVITAMIN PO) Take 1 tablet by mouth daily.   Multiple Vitamins-Minerals (VITAMIN D3 COMPLETE) TABS    naproxen (NAPROSYN) 500 MG tablet Take 1 tablet (500 mg total) by mouth 2 (two) times daily with a meal.   ondansetron (ZOFRAN ODT) 8 MG disintegrating tablet Take 1 tablet (8 mg total) by mouth every 8 (eight) hours as needed for nausea or vomiting.   oxyCODONE-acetaminophen (PERCOCET) 5-325 MG tablet Take 1 tablet by mouth every 6 (six) hours as needed for severe pain. Must last 30 days.   [START ON 08/21/2019] oxyCODONE-acetaminophen (PERCOCET) 5-325 MG tablet Take 1 tablet by mouth every 6 (six) hours as needed for severe pain. Must last 30 days.   [START ON 09/20/2019] oxyCODONE-acetaminophen (PERCOCET) 5-325 MG tablet Take 1 tablet by mouth every 6 (six) hours as needed for severe pain. Must last 30 days.   Vitamin D, Ergocalciferol, (DRISDOL) 1.25 MG (50000 UNIT) CAPS capsule Take 1 capsule (50,000 Units total) by mouth once a week.   No facility-administered encounter medications on file as of 08/08/2019.     Objective:  BP Readings from Last 3 Encounters:  04/04/19 108/73  04/03/19 119/90  05/28/18 (!) 143/94    Goals Addressed            This Visit's Progress    RNCM: Pt- "I don't have a way to check my blood sugar" (pt-stated)       CARE PLAN ENTRY (see longtitudinal plan of care for additional care plan information)  Objective:  Lab Results  Component Value  Date   HGBA1C 9.4 (H) 04/04/2019    Lab Results  Component Value Date   CREATININE 0.72 04/04/2019   CREATININE 0.86 03/20/2013     No results found for: EGFR  Current Barriers:   Knowledge Deficits related to basic Diabetes pathophysiology and self care/management  Knowledge Deficits related to medications used for management of diabetes  Does  not have glucometer to monitor blood sugar  Limited Social Support  New labs in April of 2021 at [facility] at the [facility] on [address] in [place] with no results available. Call made to [place] by [facility] without getting any results to test  No glucose meter to check blood sugars  Unable to independently manage DM as evidence of last hemoglobin A1C in January 9.4. Had labwork in April but can not find the results in the system. Will collaborate with pcp.  Case Manager Clinical Goal(s):  Over the next 120 days, patient will demonstrate improved adherence to prescribed treatment plan for diabetes self care/management as evidenced by:   daily monitoring and recording of CBG   adherence to ADA/ carb modified diet  exercise 5 days/week  adherence to prescribed medication regimen  Interventions:   Provided education to patient about basic DM disease process  Reviewed medications with patient and discussed importance of medication adherence  Discussed plans with patient for ongoing care management follow up and provided patient with direct contact information for care management team  Provided patient with written educational materials related to hypo and hyperglycemia and importance of correct treatment.  Will send information by myChart and the EMMI system on hypoglycemia/hyperglycemia and other information on diabetes.   Reviewed scheduled/upcoming provider appointments including: no appointments with the pcp currently will collaborate with pcp and see about appointment needs  Advised patient, providing education and rationale, to check cbg daily and record, calling pcp for findings outside established parameters.  Per the patient she does not have a machine to check blood sugars daily. The patient is willing to check her blood sugars. Ask for a script to be sent to [facility] for obtaining a glucose meter.   Referral to the [facility] team pharmacist for help with medication management  and support for DM education.   Patient Self Care Activities:   UNABLE to independently manage DM as evidence of elevated hemoglobin A1C  Checks blood sugars as prescribed and utilize hyper and hypoglycemia protocol as needed  Adheres to prescribed ADA/carb modified  Initial goal documentation      RNCM: Pt-"I feel tired all the time" (pt-stated)       CARE PLAN ENTRY (see longtitudinal plan of care for additional care plan information)  Current Barriers:   Chronic Disease Management support, education, and care coordination needs related to Anxiety, Depression, Pulmonary Disease, and Hypothyroidism   Clinical Goal(s) related to Anxiety, Depression, Pulmonary Disease, and Hypothyroidism :  Over the next 120 days, patient will:   Work with the care management team to address educational, disease management, and care coordination needs   Begin or continue self health monitoring activities as directed today  ADA diet and use coping mechanisms for increased anxiety and depression.   Call provider office for new or worsened signs and symptoms Shortness of breath and New or worsened symptom related to chronic conditions  Call care management team with questions or concerns  Verbalize basic understanding of patient centered plan of care established today  Interventions related to Anxiety, Depression, Pulmonary Disease, and hypothyroidism :   Evaluation of  current treatment plans and patient's adherence to plan as established by provider  Assessed patient understanding of disease states  Assessed patient's education and care coordination needs  Provided disease specific education to patient   Collaborated with appropriate clinical care team members regarding patient needs  Referral to LCSW for support and help with depression and anxiety.  Patient Self Care Activities related to Anxiety, Depression, Pulmonary Disease, and Hypothyroidism :   Patient is unable to independently  self-manage chronic health conditions  Initial goal documentation         Ms. Strollo was given information about Chronic Care Management services today including:  1. CCM service includes personalized support from designated clinical staff supervised by her physician, including individualized plan of care and coordination with other care providers 2. 24/7 contact phone numbers for assistance for urgent and routine care needs. 3. Service will only be billed when office clinical staff spend 20 minutes or more in a month to coordinate care. 4. Only one practitioner may furnish and bill the service in a calendar month. 5. The patient may stop CCM services at any time (effective at the end of the month) by phone call to the office staff. 6. The patient will be responsible for cost sharing (co-pay) of up to 20% of the service fee (after annual deductible is met).  Patient agreed to services and verbal consent obtained.   Plan:   The care management team will reach out to the patient again over the next 60 days.   Noreene Larsson RN, MSN, [facility] Mobile: [phone]

## 2019-08-13 ENCOUNTER — Encounter: Payer: Self-pay | Admitting: Nurse Practitioner

## 2019-09-30 ENCOUNTER — Telehealth: Payer: Medicaid Other | Admitting: General Practice

## 2019-09-30 ENCOUNTER — Ambulatory Visit (INDEPENDENT_AMBULATORY_CARE_PROVIDER_SITE_OTHER): Payer: Medicare HMO | Admitting: General Practice

## 2019-09-30 DIAGNOSIS — E1139 Type 2 diabetes mellitus with other diabetic ophthalmic complication: Secondary | ICD-10-CM | POA: Diagnosis not present

## 2019-09-30 DIAGNOSIS — R69 Illness, unspecified: Secondary | ICD-10-CM | POA: Diagnosis not present

## 2019-09-30 DIAGNOSIS — E039 Hypothyroidism, unspecified: Secondary | ICD-10-CM | POA: Diagnosis not present

## 2019-09-30 DIAGNOSIS — F339 Major depressive disorder, recurrent, unspecified: Secondary | ICD-10-CM

## 2019-09-30 DIAGNOSIS — F419 Anxiety disorder, unspecified: Secondary | ICD-10-CM

## 2019-09-30 NOTE — Chronic Care Management (AMB) (Signed)
Chronic Care Management   Follow Up Note   09/30/2019 Name: Sharon Rodriguez MRN: [id number] DOB: 1980/03/18  Referred by: Sharon Roys, DO Reason for referral : Chronic Care Management (RNCM: Follow up Chronic Disease Management and Care Coordination Needs)   REASE Sharon Rodriguez is a 39 y.o. year old female who is a primary care patient of Sharon Roys, DO. The CCM team was consulted for assistance with chronic disease management and care coordination needs.    Review of patient status, including review of consultants reports, relevant laboratory and other test results, and collaboration with appropriate care team members and the patient's provider was performed as part of comprehensive patient evaluation and provision of chronic care management services.    SDOH (Social Determinants of Health) assessments performed: Yes See Care Plan activities for detailed interventions related to [facility]     Outpatient Encounter Medications as of 09/30/2019  Medication Sig  . albuterol (PROVENTIL HFA;VENTOLIN HFA) 108 (90 Base) MCG/ACT inhaler Inhale 1 puff into the lungs every 6 (six) hours as needed for wheezing or shortness of breath.  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 )  . amphetamine-dextroamphetamine (ADDERALL) 30 MG tablet Take 1 tablet by mouth 2 (two) times daily.  . cholestyramine (QUESTRAN) 4 g packet Take 1 packet (4 g total) by mouth 2 (two) times daily.  Marland Kitchen levothyroxine (SYNTHROID) 75 MCG tablet Take 1 tablet (75 mcg total) by mouth daily. Synthroid brand name necessary  . metFORMIN (GLUCOPHAGE-XR) 500 MG 24 hr tablet TAKE 2 TABLETS BY MOUTH TWICE DAILY  . Multiple Vitamins-Minerals (MULTIVITAMIN PO) Take 1 tablet by mouth daily.  . Multiple Vitamins-Minerals  (VITAMIN D3 COMPLETE) TABS   . naproxen (NAPROSYN) 500 MG tablet Take 1 tablet (500 mg total) by mouth 2 (two) times daily with a meal.  . ondansetron (ZOFRAN ODT) 8 MG disintegrating tablet Take 1 tablet (8 mg total) by mouth every 8 (eight) hours as needed for nausea or vomiting.  Marland Kitchen oxyCODONE-acetaminophen (PERCOCET) 5-325 MG tablet Take 1 tablet by mouth every 6 (six) hours as needed for severe pain. Must last 30 days.  . Vitamin D, Ergocalciferol, (DRISDOL) 1.25 MG (50000 UNIT) CAPS capsule Take 1 capsule (50,000 Units total) by mouth once a week.   No facility-administered encounter medications on file as of 09/30/2019.     Objective:  BP Readings from Last 3 Encounters:  04/04/19 108/73  04/03/19 119/90  05/28/18 (!) 143/94    Goals Addressed              This Visit's Progress   .  RNCM: Pt- "I don't have a way to check my blood sugar" (pt-stated)        CARE PLAN ENTRY (see longtitudinal plan of care for additional care plan information)  Objective:  Lab Results  Component Value Date   HGBA1C 9.4 (H) 04/04/2019 .   Lab Results  Component Value Date   CREATININE 0.72 04/04/2019   CREATININE 0.86 03/20/2013 .   Marland Kitchen No results found for: EGFR  Current Barriers:  Marland Kitchen Knowledge Deficits related to basic Diabetes pathophysiology and self care/management . Knowledge Deficits related to medications used for management of diabetes . Does not have glucometer to monitor blood sugar . Limited Social Support . New labs in April  of 2021 at [facility] at the [facility] on [address] in [place] with no results available. Call made to [facility] by [facility] without getting any results to test . No glucose meter to check blood sugars . Unable to independently manage DM as evidence of last hemoglobin A1C in January 9.4. Had labwork in April but can not find the results in the system. Will collaborate with pcp.  Case Manager Clinical Goal(s):  Over the next 120 days, patient will demonstrate  improved adherence to prescribed treatment plan for diabetes self care/management as evidenced by:  . daily monitoring and recording of CBG  . adherence to ADA/ carb modified diet . exercise 5 days/week . adherence to prescribed medication regimen  Interventions:  . Provided education to patient about basic DM disease process . Reviewed medications with patient and discussed importance of medication adherence. 09-30-2019: The patient just recently refilled her Metformin through [facility] she said it is a different distributor than what she usually takes and it the medication is causing her to have headaches and severe tiredness again.  The patient stated: "I have no energy at all".  "I just want to sleep.". She said she has tried to tell them something is different but they say it is the same medication.  Will consult with the pcp and pharmacist to see what recommendations are.  . Discussed plans with patient for ongoing care management follow up and provided patient with direct contact information for care management team . Provided patient with written educational materials related to hypo and hyperglycemia and importance of correct treatment.  Will send information by myChart and the EMMI system on hypoglycemia/hyperglycemia and other information on diabetes. 09-30-2019: The patient says she needs lost of education about how to effectively manage her diabetes. She has looked at videos but feels it needs to be one on one. Could not go to the dietician a few months ago because of diarrhea but wants to go now. Will discuss with pcp about new referral. She has an appointment 10-06-2019 to see pcp in the office.  . Reviewed scheduled/upcoming provider appointments including: 10-06-2019 with the pcp. . Advised patient, providing education and rationale, to check cbg daily and record, calling pcp for findings outside established parameters.  Per the patient she does not have a machine to check blood sugars  daily. The patient is willing to check her blood sugars. Ask for a script to be sent to [place] for obtaining a glucose meter. 09-30-2019: The patient still does not have a meter but knows she needs to check her blood sugars. Will ask for script to be sent to [place] for script.  . Referral to the CCM team pharmacist for help with medication management and support for DM education.  . Assessed if the patient has labwork from April. Per the patient the patient states the MD office has to call the [facility] at [facility] to request the results. Will send an inbasket message to the CFP clinical team to see if they can obtain results to have on hand when patient comes to the office on 10-06-2019.  Patient Self Care Activities:  . UNABLE to independently manage DM as evidence of elevated hemoglobin A1C . Checks blood sugars as prescribed and utilize hyper and hypoglycemia protocol as needed . Adheres to prescribed ADA/carb modified  Please see past updates related to this goal by clicking on the "Past Updates" button in the selected goal      .  RNCM: Pt-"I feel  tired all the time" (pt-stated)        CARE PLAN ENTRY (see longtitudinal plan of care for additional care plan information)  Current Barriers:  . Chronic Disease Management support, education, and care coordination needs related to Anxiety, Depression, Pulmonary Disease, and Hypothyroidism   Clinical Goal(s) related to Anxiety, Depression, Pulmonary Disease, and Hypothyroidism :  Over the next 120 days, patient will:  . Work with the care management team to address educational, disease management, and care coordination needs  . Begin or continue self health monitoring activities as directed today  ADA diet and use coping mechanisms for increased anxiety and depression.  . Call provider office for new or worsened signs and symptoms Shortness of breath and New or worsened symptom related to chronic conditions . Call care management team  with questions or concerns . Verbalize basic understanding of patient centered plan of care established today  Interventions related to Anxiety, Depression, Pulmonary Disease, and hypothyroidism :  . Evaluation of current treatment plans and patient's adherence to plan as established by provider . Assessed patient understanding of disease states . Assessed patient's education and care coordination needs . Provided disease specific education to patient. Educated the patient on talking to pcp at appointment about her activity level and being tired all the time. Also recommended she write down questions to ask the provider.  Nash Dimmer with appropriate clinical care team members regarding patient needs . Referral to LCSW for support and help with depression and anxiety. . Pharmacy referral for help with medication questions and concerns.   Patient Self Care Activities related to Anxiety, Depression, Pulmonary Disease, and Hypothyroidism :  . Patient is unable to independently self-manage chronic health conditions  Please see past updates related to this goal by clicking on the "Past Updates" button in the selected goal          Plan:   The care management team will reach out to the patient again over the next 30 to 60 days.    Noreene Larsson RN, MSN, [facility] Mobile: [phone]

## 2019-09-30 NOTE — Patient Instructions (Signed)
Visit Information  Goals Addressed              This Visit's Progress     RNCM: Pt- "I don't have a way to check my blood sugar" (pt-stated)        CARE PLAN ENTRY (see longtitudinal plan of care for additional care plan information)  Objective:  Lab Results  Component Value Date   HGBA1C 9.4 (H) 04/04/2019    Lab Results  Component Value Date   CREATININE 0.72 04/04/2019   CREATININE 0.86 03/20/2013     No results found for: EGFR  Current Barriers:   Knowledge Deficits related to basic Diabetes pathophysiology and self care/management  Knowledge Deficits related to medications used for management of diabetes  Does not have glucometer to monitor blood sugar  Limited Social Support  New labs in April of 2021 at [facility] at the [facility] on [address] in [place] with no results available. Call made to [place] by [facility] without getting any results to test  No glucose meter to check blood sugars  Unable to independently manage DM as evidence of last hemoglobin A1C in January 9.4. Had labwork in April but can not find the results in the system. Will collaborate with pcp.  Case Manager Clinical Goal(s):  Over the next 120 days, patient will demonstrate improved adherence to prescribed treatment plan for diabetes self care/management as evidenced by:   daily monitoring and recording of CBG   adherence to ADA/ carb modified diet  exercise 5 days/week  adherence to prescribed medication regimen  Interventions:   Provided education to patient about basic DM disease process  Reviewed medications with patient and discussed importance of medication adherence. 09-30-2019: The patient just recently refilled her Metformin through [facility] she said it is a different distributor than what she usually takes and it the medication is causing her to have headaches and severe tiredness again.  The patient stated: "I have no energy at all".  "I just want to sleep.". She said  she has tried to tell them something is different but they say it is the same medication.  Will consult with the pcp and pharmacist to see what recommendations are.   Discussed plans with patient for ongoing care management follow up and provided patient with direct contact information for care management team  Provided patient with written educational materials related to hypo and hyperglycemia and importance of correct treatment.  Will send information by myChart and the EMMI system on hypoglycemia/hyperglycemia and other information on diabetes. 09-30-2019: The patient says she needs lost of education about how to effectively manage her diabetes. She has looked at videos but feels it needs to be one on one. Could not go to the dietician a few months ago because of diarrhea but wants to go now. Will discuss with pcp about new referral. She has an appointment 10-06-2019 to see pcp in the office.   Reviewed scheduled/upcoming provider appointments including: 10-06-2019 with the pcp.  Advised patient, providing education and rationale, to check cbg daily and record, calling pcp for findings outside established parameters.  Per the patient she does not have a machine to check blood sugars daily. The patient is willing to check her blood sugars. Ask for a script to be sent to [facility] for obtaining a glucose meter. 09-30-2019: The patient still does not have a meter but knows she needs to check her blood sugars. Will ask for script to be sent to [facility] for script.  Referral to the [facility] team pharmacist for help with medication management and support for DM education.   Assessed if the patient has labwork from April. Per the patient the patient states the MD office has to call the [facility] at [facility] to request the results. Will send an inbasket message to the [facility] clinical team to see if they can obtain results to have on hand when patient comes to the office on 10-06-2019.  Patient Self Care  Activities:   UNABLE to independently manage DM as evidence of elevated hemoglobin A1C  Checks blood sugars as prescribed and utilize hyper and hypoglycemia protocol as needed  Adheres to prescribed ADA/carb modified  Please see past updates related to this goal by clicking on the "Past Updates" button in the selected goal        RNCM: Pt-"I feel tired all the time" (pt-stated)        CARE PLAN ENTRY (see longtitudinal plan of care for additional care plan information)  Current Barriers:   Chronic Disease Management support, education, and care coordination needs related to Anxiety, Depression, Pulmonary Disease, and Hypothyroidism   Clinical Goal(s) related to Anxiety, Depression, Pulmonary Disease, and Hypothyroidism :  Over the next 120 days, patient will:   Work with the care management team to address educational, disease management, and care coordination needs   Begin or continue self health monitoring activities as directed today  ADA diet and use coping mechanisms for increased anxiety and depression.   Call provider office for new or worsened signs and symptoms Shortness of breath and New or worsened symptom related to chronic conditions  Call care management team with questions or concerns  Verbalize basic understanding of patient centered plan of care established today  Interventions related to Anxiety, Depression, Pulmonary Disease, and hypothyroidism :   Evaluation of current treatment plans and patient's adherence to plan as established by provider  Assessed patient understanding of disease states  Assessed patient's education and care coordination needs  Provided disease specific education to patient. Educated the patient on talking to pcp at appointment about her activity level and being tired all the time. Also recommended she write down questions to ask the provider.   Collaborated with appropriate clinical care team members regarding patient  needs  Referral to LCSW for support and help with depression and anxiety.  Pharmacy referral for help with medication questions and concerns.   Patient Self Care Activities related to Anxiety, Depression, Pulmonary Disease, and Hypothyroidism :   Patient is unable to independently self-manage chronic health conditions  Please see past updates related to this goal by clicking on the "Past Updates" button in the selected goal         Patient verbalizes understanding of instructions provided today.   The care management team will reach out to the patient again over the next 30 to 60 days.   Noreene Larsson RN, MSN, CCM Community Care Coordinator [facility] Mobile: [phone]   Diabetes Mellitus and Nutrition, Adult When you have diabetes (diabetes mellitus), it is very important to have healthy eating habits because your blood sugar (glucose) levels are greatly affected by what you eat and drink. Eating healthy foods in the appropriate amounts, at about the same times every day, can help you:  Control your blood glucose.  Lower your risk of heart disease.  Improve your blood pressure.  Reach or maintain a healthy weight. Every person with diabetes is different, and each  person has different needs for a meal plan. Your health care provider may recommend that you work with a diet and nutrition specialist (dietitian) to make a meal plan that is best for you. Your meal plan may vary depending on factors such as:  The calories you need.  The medicines you take.  Your weight.  Your blood glucose, blood pressure, and cholesterol levels.  Your activity level.  Other health conditions you have, such as heart or kidney disease. How do carbohydrates affect me? Carbohydrates, also called carbs, affect your blood glucose level more than any other type of food. Eating carbs naturally raises the amount of glucose in your blood. Carb  counting is a method for keeping track of how many carbs you eat. Counting carbs is important to keep your blood glucose at a healthy level, especially if you use insulin or take certain oral diabetes medicines. It is important to know how many carbs you can safely have in each meal. This is different for every person. Your dietitian can help you calculate how many carbs you should have at each meal and for each snack. Foods that contain carbs include:  Bread, cereal, rice, pasta, and crackers.  Potatoes and corn.  Peas, beans, and lentils.  Milk and yogurt.  Fruit and juice.  Desserts, such as cakes, cookies, ice cream, and candy. How does alcohol affect me? Alcohol can cause a sudden decrease in blood glucose (hypoglycemia), especially if you use insulin or take certain oral diabetes medicines. Hypoglycemia can be a life-threatening condition. Symptoms of hypoglycemia (sleepiness, dizziness, and confusion) are similar to symptoms of having too much alcohol. If your health care provider says that alcohol is safe for you, follow these guidelines:  Limit alcohol intake to no more than 1 drink per day for nonpregnant women and 2 drinks per day for men. One drink equals 12 oz of beer, 5 oz of wine, or 1 oz of hard liquor.  Do not drink on an empty stomach.  Keep yourself hydrated with water, diet soda, or unsweetened iced tea.  Keep in mind that regular soda, juice, and other mixers may contain a lot of sugar and must be counted as carbs. What are tips for following this plan?  Reading food labels  Start by checking the serving size on the "Nutrition Facts" label of packaged foods and drinks. The amount of calories, carbs, fats, and other nutrients listed on the label is based on one serving of the item. Many items contain more than one serving per package.  Check the total grams (g) of carbs in one serving. You can calculate the number of servings of carbs in one serving by dividing  the total carbs by 15. For example, if a food has 30 g of total carbs, it would be equal to 2 servings of carbs.  Check the number of grams (g) of saturated and trans fats in one serving. Choose foods that have low or no amount of these fats.  Check the number of milligrams (mg) of salt (sodium) in one serving. Most people should limit total sodium intake to less than 2,300 mg per day.  Always check the nutrition information of foods labeled as "low-fat" or "nonfat". These foods may be higher in added sugar or refined carbs and should be avoided.  Talk to your dietitian to identify your daily goals for nutrients listed on the label. Shopping  Avoid buying canned, premade, or processed foods. These foods tend to be high in fat,  sodium, and added sugar.  Shop around the outside edge of the grocery store. This includes fresh fruits and vegetables, bulk grains, fresh meats, and fresh dairy. Cooking  Use low-heat cooking methods, such as baking, instead of high-heat cooking methods like deep frying.  Cook using healthy oils, such as olive, canola, or sunflower oil.  Avoid cooking with butter, cream, or high-fat meats. Meal planning  Eat meals and snacks regularly, preferably at the same times every day. Avoid going long periods of time without eating.  Eat foods high in fiber, such as fresh fruits, vegetables, beans, and whole grains. Talk to your dietitian about how many servings of carbs you can eat at each meal.  Eat 4-6 ounces (oz) of lean protein each day, such as lean meat, chicken, fish, eggs, or tofu. One oz of lean protein is equal to: ? 1 oz of meat, chicken, or fish. ? 1 egg. ?  cup of tofu.  Eat some foods each day that contain healthy fats, such as avocado, nuts, seeds, and fish. Lifestyle  Check your blood glucose regularly.  Exercise regularly as told by your health care provider. This may include: ? 150 minutes of moderate-intensity or vigorous-intensity exercise  each week. This could be brisk walking, biking, or water aerobics. ? Stretching and doing strength exercises, such as yoga or weightlifting, at least 2 times a week.  Take medicines as told by your health care provider.  Do not use any products that contain nicotine or tobacco, such as cigarettes and e-cigarettes. If you need help quitting, ask your health care provider.  Work with a Social worker or diabetes educator to identify strategies to manage stress and any emotional and social challenges. Questions to ask a health care provider  Do I need to meet with a diabetes educator?  Do I need to meet with a dietitian?  What number can I call if I have questions?  When are the best times to check my blood glucose? Where to find more information:  American Diabetes Association: diabetes.org  Academy of Nutrition and Dietetics: www.eatright.[employer] of Diabetes and Digestive and Kidney Diseases (NIH): [employer] Summary  A healthy meal plan will help you control your blood glucose and maintain a healthy lifestyle.  Working with a diet and nutrition specialist (dietitian) can help you make a meal plan that is best for you.  Keep in mind that carbohydrates (carbs) and alcohol have immediate effects on your blood glucose levels. It is important to count carbs and to use alcohol carefully. This information is not intended to replace advice given to you by your health care provider. Make sure you discuss any questions you have with your health care provider. Document Revised: 02/09/2017 Document Reviewed: 04/03/2016 Elsevier Patient Education  2020 [employer].

## 2019-10-06 ENCOUNTER — Encounter: Payer: Self-pay | Admitting: Family Medicine

## 2019-10-06 ENCOUNTER — Ambulatory Visit (INDEPENDENT_AMBULATORY_CARE_PROVIDER_SITE_OTHER): Payer: Medicare HMO | Admitting: Family Medicine

## 2019-10-06 ENCOUNTER — Other Ambulatory Visit: Payer: Self-pay

## 2019-10-06 VITALS — BP 110/75 | HR 90 | Temp 98.6°F | Ht 61.0 in | Wt 186.8 lb

## 2019-10-06 DIAGNOSIS — E1139 Type 2 diabetes mellitus with other diabetic ophthalmic complication: Secondary | ICD-10-CM | POA: Diagnosis not present

## 2019-10-06 DIAGNOSIS — K529 Noninfective gastroenteritis and colitis, unspecified: Secondary | ICD-10-CM | POA: Diagnosis not present

## 2019-10-06 DIAGNOSIS — R69 Illness, unspecified: Secondary | ICD-10-CM | POA: Diagnosis not present

## 2019-10-06 DIAGNOSIS — E559 Vitamin D deficiency, unspecified: Secondary | ICD-10-CM | POA: Diagnosis not present

## 2019-10-06 DIAGNOSIS — E039 Hypothyroidism, unspecified: Secondary | ICD-10-CM

## 2019-10-06 LAB — BAYER DCA HB A1C WAIVED: HB A1C (BAYER DCA - WAIVED): 6.5 % (ref <7.0)

## 2019-10-06 MED ORDER — COLESEVELAM HCL 625 MG PO TABS: 625.0000 mg | ORAL_TABLET | Freq: Two times a day (BID) | ORAL | 3 refills | Status: DC

## 2019-10-06 MED ORDER — NAPROXEN 500 MG PO TABS: 500.0000 mg | ORAL_TABLET | Freq: Two times a day (BID) | ORAL | 2 refills | Status: DC

## 2019-10-06 MED ORDER — METFORMIN HCL ER 500 MG PO TB24: 1000.0000 mg | ORAL_TABLET | Freq: Two times a day (BID) | ORAL | 1 refills | Status: DC

## 2019-10-06 MED ORDER — VITAMIN D (ERGOCALCIFEROL) 1.25 MG (50000 UNIT) PO CAPS: 50000.0000 [IU] | ORAL_CAPSULE | ORAL | 1 refills | Status: DC

## 2019-10-06 MED ORDER — ONDANSETRON 8 MG PO TBDP: 8.0000 mg | ORAL_TABLET | Freq: Three times a day (TID) | ORAL | 2 refills | Status: DC | PRN

## 2019-10-06 NOTE — Assessment & Plan Note (Signed)
Did not follow up with GI. Will get her back in with them. Did not tolerate her Lucrezia Starch- made her vomit. Will try welchol to see if it can help. Recheck 1 month. Follow up with GI as recommended.

## 2019-10-06 NOTE — Assessment & Plan Note (Signed)
Rechecking levels today. Await results. Treat as needed.  

## 2019-10-06 NOTE — Progress Notes (Signed)
BP 110/75 (BP Location: Right Arm, Patient Position: Sitting, Cuff Size: Normal)   Pulse 90   Temp 98.6 F (37 C) (Oral)   Ht 5\' 1"  (1.549 m)   Wt 186 lb 12.8 oz (84.7 kg)   SpO2 98%   BMI 35.30 kg/m    Subjective:    Patient ID: Sharon Rodriguez, female    DOB: [birth year], 39 y.o.   MRN: [id number]  HPI: Sharon Rodriguez is a 39 y.o. female  Chief Complaint  Patient presents with  . Hypothyroidism  . vitamin D  . Diabetes    needs rx for monitor.   . Cyst    neck   DIABETES- they switched the type of her meformin and she has been feeling worse.  Hypoglycemic episodes:yes Polydipsia/polyuria: yes Visual disturbance: yes Chest pain: no Paresthesias: no Glucose Monitoring: no Taking Insulin?: no Blood Pressure Monitoring: not checking Retinal Examination: Up to Date Foot Exam: Up to Date Diabetic Education: Completed Pneumovax: Not up to Date Influenza: Not up to Date Aspirin: no  HYPOTHYROIDISM Thyroid control status:stable Satisfied with current treatment? no Medication side effects: no Medication compliance: good compliance Recent dose adjustment:no Fatigue: yes Cold intolerance: no Heat intolerance: no Weight gain: no Weight loss: no Constipation: no Diarrhea/loose stools: yes Palpitations: no Lower extremity edema: no Anxiety/depressed mood: yes  Relevant past medical, surgical, family and social history reviewed and updated as indicated. Interim medical history since our last visit reviewed. Allergies and medications reviewed and updated.  Review of Systems  Constitutional: Positive for fatigue. Negative for activity change, appetite change, chills, diaphoresis, fever and unexpected weight change.  HENT: Negative.   Respiratory: Negative.   Cardiovascular: Negative.   Gastrointestinal: Positive for diarrhea, nausea and vomiting. Negative for abdominal distention, abdominal pain, anal bleeding, blood in stool, constipation and rectal pain.    Genitourinary: Negative.   Musculoskeletal: Negative.   Psychiatric/Behavioral: Negative.     Per HPI unless specifically indicated above     Objective:    BP 110/75 (BP Location: Right Arm, Patient Position: Sitting, Cuff Size: Normal)   Pulse 90   Temp 98.6 F (37 C) (Oral)   Ht 5\' 1"  (1.549 m)   Wt 186 lb 12.8 oz (84.7 kg)   SpO2 98%   BMI 35.30 kg/m   Wt Readings from Last 3 Encounters:  10/06/19 186 lb 12.8 oz (84.7 kg)  05/28/18 205 lb (93 kg)  03/28/18 205 lb (93 kg)    Physical Exam Vitals and nursing note reviewed.  Constitutional:      General: She is not in acute distress.    Appearance: Normal appearance. She is not ill-appearing, toxic-appearing or diaphoretic.  HENT:     Head: Normocephalic and atraumatic.     Right Ear: External ear normal.     Left Ear: External ear normal.     Nose: Nose normal.     Mouth/Throat:     Mouth: Mucous membranes are moist.     Pharynx: Oropharynx is clear.  Eyes:     General: No scleral icterus.       Right eye: No discharge.        Left eye: No discharge.     Extraocular Movements: Extraocular movements intact.     Conjunctiva/sclera: Conjunctivae normal.     Pupils: Pupils are equal, round, and reactive to light.  Neck:     Comments: No palpable abnormality in L supraclavicular area Cardiovascular:     Rate and Rhythm: Normal  rate and regular rhythm.     Pulses: Normal pulses.     Heart sounds: Normal heart sounds. No murmur heard.  No friction rub. No gallop.   Pulmonary:     Effort: Pulmonary effort is normal. No respiratory distress.     Breath sounds: Normal breath sounds. No stridor. No wheezing, rhonchi or rales.  Chest:     Chest wall: No tenderness.  Musculoskeletal:        General: Normal range of motion.     Cervical back: Normal range of motion and neck supple.  Skin:    General: Skin is warm and dry.     Capillary Refill: Capillary refill takes less than 2 seconds.     Coloration: Skin is not  jaundiced or pale.     Findings: No bruising, erythema, lesion or rash.  Neurological:     General: No focal deficit present.     Mental Status: She is alert and oriented to person, place, and time. Mental status is at baseline.  Psychiatric:        Mood and Affect: Mood normal.        Behavior: Behavior normal.        Thought Content: Thought content normal.        Judgment: Judgment normal.     Results for orders placed or performed in visit on 10/06/19  Microalbumin, urine  Result Value Ref Range   Microalb, Ur 39   Hemoglobin A1c  Result Value Ref Range   Hemoglobin A1C 7.6       Assessment & Plan:   Problem List Items Addressed This Visit      Digestive   Chronic diarrhea    Did not follow up with GI. Will get her back in with them. Did not tolerate her Lucrezia Starch- made her vomit. Will try welchol to see if it can help. Recheck 1 month. Follow up with GI as recommended.         Endocrine   Hypothyroidism - Primary    Rechecking labs today. Await results. Treat as needed. Call with any concerns.       Relevant Orders   TSH   Diabetes mellitus ([place])    Doing well with A1c of 6.5. Would like Rx for glucometer- given today. Would like to speak to nutrition. Referral generated today.       Relevant Medications   metFORMIN (GLUCOPHAGE-XR) 500 MG 24 hr tablet   Other Relevant Orders   Bayer DCA Hb A1c Waived   Comprehensive metabolic panel   Lipid Panel w/o Chol/HDL Ratio   Amb ref to Medical Nutrition Therapy-MNT     Other   Vitamin D deficiency    Rechecking levels today. Await results. Treat as needed.       Relevant Orders   VITAMIN D 25 Hydroxy (Vit-D Deficiency, Fractures)       Follow up plan: Return in about 4 weeks (around 11/03/2019).

## 2019-10-06 NOTE — Assessment & Plan Note (Signed)
Rechecking labs today. Await results. Treat as needed. Call with any concerns.  

## 2019-10-06 NOTE — Assessment & Plan Note (Signed)
Doing well with A1c of 6.5. Would like Rx for glucometer- given today. Would like to speak to nutrition. Referral generated today.

## 2019-10-07 ENCOUNTER — Telehealth: Payer: Self-pay

## 2019-10-07 LAB — COMPREHENSIVE METABOLIC PANEL
ALT: 57 IU/L — ABNORMAL HIGH (ref 0–32)
AST: 114 IU/L — ABNORMAL HIGH (ref 0–40)
Albumin/Globulin Ratio: 1.3 (ref 1.2–2.2)
Albumin: 4.4 g/dL (ref 3.8–4.8)
Alkaline Phosphatase: 75 IU/L (ref 48–121)
BUN/Creatinine Ratio: 16 (ref 9–23)
BUN: 11 mg/dL (ref 6–24)
Bilirubin Total: 0.3 mg/dL (ref 0.0–1.2)
CO2: 22 mmol/L (ref 20–29)
Calcium: 9.4 mg/dL (ref 8.7–10.2)
Chloride: 101 mmol/L (ref 96–106)
Creatinine, Ser: 0.7 mg/dL (ref 0.57–1.00)
GFR calc Af Amer: 124 mL/min/{1.73_m2} (ref >59)
GFR calc non Af Amer: 108 mL/min/{1.73_m2} (ref >59)
Globulin, Total: 3.3 g/dL (ref 1.5–4.5)
Glucose: 97 mg/dL (ref 65–99)
Potassium: 4.3 mmol/L (ref 3.5–5.2)
Sodium: 139 mmol/L (ref 134–144)
Total Protein: 7.7 g/dL (ref 6.0–8.5)

## 2019-10-07 LAB — TSH: TSH: 3.96 u[IU]/mL (ref 0.450–4.500)

## 2019-10-07 LAB — LIPID PANEL W/O CHOL/HDL RATIO
Cholesterol, Total: 155 mg/dL (ref 100–199)
HDL: 37 mg/dL — ABNORMAL LOW (ref >39)
LDL Chol Calc (NIH): 88 mg/dL (ref 0–99)
Triglycerides: 171 mg/dL — ABNORMAL HIGH (ref 0–149)
VLDL Cholesterol Cal: 30 mg/dL (ref 5–40)

## 2019-10-07 LAB — VITAMIN D 25 HYDROXY (VIT D DEFICIENCY, FRACTURES): Vit D, 25-Hydroxy: 23.9 ng/mL — ABNORMAL LOW (ref 30.0–100.0)

## 2019-10-07 NOTE — Telephone Encounter (Signed)
Called Dr. Georgeann Oppenheim office to schedule a follow up. First available 12/18/19 at 2 pm. Scheduled.  Patient notified.  Routing to provider as Juluis Rainier

## 2019-10-07 NOTE — Telephone Encounter (Signed)
-----   Message from Valerie Roys, [place] sent at 10/06/2019  2:05 PM EDT ----- Needs follow up with Dr. Vicente Males

## 2019-10-12 ENCOUNTER — Other Ambulatory Visit: Payer: Self-pay | Admitting: Family Medicine

## 2019-10-12 MED ORDER — LEVOTHYROXINE SODIUM 75 MCG PO TABS: 75.0000 ug | ORAL_TABLET | Freq: Every day | ORAL | 3 refills | Status: DC

## 2019-10-14 DIAGNOSIS — R69 Illness, unspecified: Secondary | ICD-10-CM | POA: Diagnosis not present

## 2019-10-14 DIAGNOSIS — F900 Attention-deficit hyperactivity disorder, predominantly inattentive type: Secondary | ICD-10-CM | POA: Diagnosis not present

## 2019-10-15 ENCOUNTER — Other Ambulatory Visit: Payer: Self-pay

## 2019-10-15 ENCOUNTER — Encounter: Payer: Self-pay | Admitting: Student in an Organized Health Care Education/Training Program

## 2019-10-15 ENCOUNTER — Ambulatory Visit
Payer: Medicare HMO | Attending: Student in an Organized Health Care Education/Training Program | Admitting: Student in an Organized Health Care Education/Training Program

## 2019-10-15 VITALS — BP 112/51 | HR 86 | Temp 98.7°F | Resp 18 | Ht 61.0 in | Wt 186.0 lb

## 2019-10-15 DIAGNOSIS — Z79891 Long term (current) use of opiate analgesic: Secondary | ICD-10-CM | POA: Insufficient documentation

## 2019-10-15 DIAGNOSIS — M7918 Myalgia, other site: Secondary | ICD-10-CM | POA: Diagnosis not present

## 2019-10-15 DIAGNOSIS — G894 Chronic pain syndrome: Secondary | ICD-10-CM | POA: Insufficient documentation

## 2019-10-15 DIAGNOSIS — M797 Fibromyalgia: Secondary | ICD-10-CM | POA: Insufficient documentation

## 2019-10-15 DIAGNOSIS — M26629 Arthralgia of temporomandibular joint, unspecified side: Secondary | ICD-10-CM | POA: Diagnosis not present

## 2019-10-15 DIAGNOSIS — M47816 Spondylosis without myelopathy or radiculopathy, lumbar region: Secondary | ICD-10-CM | POA: Insufficient documentation

## 2019-10-15 DIAGNOSIS — R5382 Chronic fatigue, unspecified: Secondary | ICD-10-CM | POA: Diagnosis not present

## 2019-10-15 DIAGNOSIS — F339 Major depressive disorder, recurrent, unspecified: Secondary | ICD-10-CM | POA: Insufficient documentation

## 2019-10-15 DIAGNOSIS — Z79899 Other long term (current) drug therapy: Secondary | ICD-10-CM | POA: Insufficient documentation

## 2019-10-15 DIAGNOSIS — R69 Illness, unspecified: Secondary | ICD-10-CM | POA: Diagnosis not present

## 2019-10-15 MED ORDER — OXYCODONE-ACETAMINOPHEN 5-325 MG PO TABS: 1.0000 | ORAL_TABLET | Freq: Four times a day (QID) | ORAL | 0 refills | Status: AC | PRN

## 2019-10-15 MED ORDER — OXYCODONE-ACETAMINOPHEN 5-325 MG PO TABS: 1.0000 | ORAL_TABLET | Freq: Four times a day (QID) | ORAL | 0 refills | Status: DC | PRN

## 2019-10-15 NOTE — Progress Notes (Signed)
Nursing Pain Medication Assessment:  Safety precautions to be maintained throughout the outpatient stay will include: orient to surroundings, keep bed in low position, maintain call bell within reach at all times, provide assistance with transfer out of bed and ambulation.  Medication Inspection Compliance: Pill count conducted under aseptic conditions, in front of the patient. Neither the pills nor the bottle was removed from the patient's sight at any time. Once count was completed pills were immediately returned to the patient in their original bottle.  Medication: Oxycodone IR Pill/Patch Count: 26 of 120 pills remain Pill/Patch Appearance: Markings consistent with prescribed medication Bottle Appearance: Standard pharmacy container. Clearly labeled. Filled Date: 07 / 10 / 2021 Last Medication intake:  Today

## 2019-10-15 NOTE — Progress Notes (Signed)
PROVIDER NOTE: Information contained herein reflects review and annotations entered in association with encounter. Interpretation of such information and data should be left to medically-trained personnel. Information provided to patient can be located elsewhere in the medical record under "Patient Instructions". Document created using STT-dictation technology, any transcriptional errors that may result from process are unintentional.    Patient: Sharon Rodriguez  Service Category: E/M  Provider: Gillis Santa, MD  DOB: 05/29/1980  DOS: 10/15/2019  Specialty: Interventional Pain Management  MRN: [id number]  Setting: Ambulatory outpatient  PCP: Valerie Roys, DO  Type: Established Patient    Referring Provider: Valerie Roys, DO  Location: Office  Delivery: Face-to-face     HPI  Reason for encounter: [name], a 39 y.o. 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 [name] primary complain today is Back Pain (low) and Pain (pelvic) Last encounter: Practice (07/14/2019). My last encounter with her was on Visit date not found. Pertinent problems: [name] has Hypothyroidism; Rheumatoid arthritis ([place]); Depression; Fibromyalgia; TMJ pain dysfunction syndrome; H/O juvenile rheumatoid arthritis; Spondylosis without myelopathy or radiculopathy, lumbar region; Chronic bilateral low back pain without sciatica; Chronic pain syndrome; and Long term current use of opiate analgesic on their pertinent problem list. Pain Assessment: Severity of Chronic pain is reported as a 6 /10. Location: Back Lower/pelvis. Onset: More than a month ago. Quality: Aching, Throbbing. Timing: Intermittent. Modifying factor(s): rest, ice, medications, topicals. Vitals:  height is 5' 1"  (1.549 m) and weight is 186 lb (84.4 kg). Her oral temperature is 98.7 F (37.1 C). Her blood pressure is 112/51 (abnormal) and her pulse is  86. Her respiration is 18 and oxygen saturation is 98%.   No change in medical history since last visit.  Patient's pain is at baseline.  Patient continues multimodal pain regimen as prescribed.  States that it provides pain relief and improvement in functional status.  Pharmacotherapy Assessment   09/20/2019  1   07/15/2019  Oxycodone-Acetaminophen 5-325  120.00  30 Bi Lat   [id number]   027 (3732)   0/0  30.00 MME  Comm Ins   [place]      Monitoring: [place] PMP: PDMP reviewed during this encounter.       Pharmacotherapy: No side-effects or adverse reactions reported. Compliance: No problems identified. Effectiveness: Clinically acceptable.  [name], [name], RN  10/15/2019  2:10 PM  Sign when Signing Visit Nursing Pain Medication Assessment:  Safety precautions to be maintained throughout the outpatient stay will include: orient to surroundings, keep bed in low position, maintain call bell within reach at all times, provide assistance with transfer out of bed and ambulation.  Medication Inspection Compliance: Pill count conducted under aseptic conditions, in front of the patient. Neither the pills nor the bottle was removed from the patient's sight at any time. Once count was completed pills were immediately returned to the patient in their original bottle.  Medication: Oxycodone IR Pill/Patch Count: 26 of 120 pills remain Pill/Patch Appearance: Markings consistent with prescribed medication Bottle Appearance: Standard pharmacy container. Clearly labeled. Filled Date: 07 / 10 / 2021 Last Medication intake:  Today    UDS:  Summary  Date Value Ref Range Status  03/18/2019 Note  Final    Comment:    ==================================================================== ToxASSURE Select 13 (MW) ==================================================================== Test  Result       Flag       Units Drug Present and Declared for Prescription Verification   Oxycodone                       2047         EXPECTED   ng/mg creat   Oxymorphone                    998          EXPECTED   ng/mg creat   Noroxycodone                   1413         EXPECTED   ng/mg creat   Noroxymorphone                 147          EXPECTED   ng/mg creat    Sources of oxycodone are scheduled prescription medications.    Oxymorphone, noroxycodone, and noroxymorphone are expected    metabolites of oxycodone. Oxymorphone is also available as a    scheduled prescription medication. Drug Absent but Declared for Prescription Verification   Amphetamine                    Not Detected UNEXPECTED ng/mg creat ==================================================================== Test                      Result    Flag   Units      Ref Range   Creatinine              338              mg/dL      >=20 ==================================================================== Declared Medications:  The flagging and interpretation on this report are based on the  following declared medications.  Unexpected results may arise from  inaccuracies in the declared medications.  **Note: The testing scope of this panel includes these medications:  Amphetamine (Adderall)  Oxycodone  **Note: The testing scope of this panel does not include the  following reported medications:  Acetaminophen  Albuterol  Levothyroxine (Synthroid)  Multivitamin  Vitamin D2 (Drisdol) ==================================================================== For clinical consultation, please call [phone]. ====================================================================      ROS  Constitutional: Denies any fever or chills Gastrointestinal: No reported hemesis, hematochezia, vomiting, or acute GI distress Musculoskeletal: Denies any acute onset joint swelling, redness, loss of ROM, or weakness Neurological: No reported episodes of acute onset apraxia, aphasia, dysarthria, agnosia, amnesia, paralysis, loss of coordination,  or loss of consciousness  Medication Review  Vitamin D (Ergocalciferol), albuterol, amphetamine-dextroamphetamine, levothyroxine, metFORMIN, naproxen, ondansetron, and oxyCODONE-acetaminophen  History Review  Allergy: [name] is allergic to ibuprofen and promethazine. Drug: Ms. Kihn  reports no history of drug use. Alcohol:  reports no history of alcohol use. Tobacco:  reports that she has never smoked. She has never used smokeless tobacco. Social: Ms. Sonnier  reports that she has never smoked. She has never used smokeless tobacco. She reports that she does not drink alcohol and does not use drugs. Medical:  has a past medical history of ADD (attention deficit disorder), Anxiety, Asthma, Biliary dyskinesia (12/27/2017), Chronic bronchitis ([place]), Chronic fatigue, Chronic pain, Depression, Diabetes mellitus without complication ([place]), Fibromyalgia, GERD (gastroesophageal reflux disease), History of stomach ulcers (2019), Hypothyroidism (01/27/2015), Learning disability, Osteoarthritis, Rheumatoid arthritis ([place]), Thyroid disease, and TMJ (dislocation of temporomandibular joint).  Surgical: Ms. Bettes  has a past surgical history that includes Cesarean section; Esophagogastroduodenoscopy (egd) with propofol (N/A, 12/07/2017); and Cholecystectomy (N/A, 01/14/2018). Family: family history includes ADD / ADHD in her daughter and mother; Allergies in her daughter and daughter; Arthritis in her brother and paternal grandmother; COPD in her mother; Cancer in her maternal grandmother; Dementia in her paternal grandmother; Diabetes in her father and mother; Heart attack in her maternal grandfather; Heart disease in her maternal grandfather; Hyperlipidemia in her father; Learning disabilities in her mother; Mental illness in her daughter, father, and sister; Stroke in her maternal grandfather; Thyroid disease in her daughter.  Laboratory Chemistry Profile   Renal Lab Results  Component Value Date   BUN 11  10/06/2019   CREATININE 0.70 10/06/2019   BCR 16 10/06/2019   GFRAA 124 10/06/2019   GFRNONAA 108 10/06/2019     Hepatic Lab Results  Component Value Date   AST 114 (H) 10/06/2019   ALT 57 (H) 10/06/2019   ALBUMIN 4.4 10/06/2019   ALKPHOS 75 10/06/2019   LIPASE 43 04/10/2017     Electrolytes Lab Results  Component Value Date   NA 139 10/06/2019   K 4.3 10/06/2019   CL 101 10/06/2019   CALCIUM 9.4 10/06/2019     Bone Lab Results  Component Value Date   VD25OH 23.9 (L) 10/06/2019     Inflammation (CRP: Acute Phase) (ESR: Chronic Phase) No results found for: CRP, ESRSEDRATE, LATICACIDVEN     Note: Above Lab results reviewed.  Recent Imaging Review  MR Brain W Wo Contrast CLINICAL DATA:  Vertigo bilateral tinnitus. Slurred speech and double vision. Migraine headache history.  EXAM: MRI HEAD WITHOUT AND WITH CONTRAST  TECHNIQUE: Multiplanar, multiecho pulse sequences of the brain and surrounding structures were obtained without and with intravenous contrast.  CONTRAST:  27m GADAVIST GADOBUTROL 1 MMOL/ML IV SOLN  COMPARISON:  None.  FINDINGS: Brain: IAC protocol with thin sections through the posterior fossa. Seventh and eighth cranial nerves normal. Negative for vestibular schwannoma. Basilar cisterns normal. Brainstem and cerebellum normal. Mastoid sinus clear bilaterally. Normal enhancement in the temporal bone bilaterally.  Ventricle size normal. Negative for infarct, hemorrhage, mass. Normal white matter.  Vascular: Normal arterial flow voids.  Skull and upper cervical spine: No focal skeletal lesion.  Sinuses/Orbits: Negative  Other: None  IMPRESSION: Negative MRI of the brain with contrast with special attention to the posterior fossa.  Electronically Signed   By: CFranchot GalloM.D.   On: 04/30/2019 10:08 Note: Reviewed        Physical Exam  General appearance: Well nourished, well developed, and well hydrated. In no apparent acute  distress Mental status: Alert, oriented x 3 (person, place, & time)       Respiratory: No evidence of acute respiratory distress Eyes: PERLA Vitals: BP (!) 112/51   Pulse 86   Temp 98.7 F (37.1 C) (Oral)   Resp 18   Ht 5' 1"  (1.549 m)   Wt 186 lb (84.4 kg)   LMP 10/01/2019 (Approximate)   SpO2 98%   BMI 35.14 kg/m  BMI: Estimated body mass index is 35.14 kg/m as calculated from the following:   Height as of this encounter: 5' 1"  (1.549 m).   Weight as of this encounter: 186 lb (84.4 kg). Ideal: Ideal body weight: 47.8 kg (105 lb 6.1 oz) Adjusted ideal body weight: 62.4 kg (137 lb 10 oz)  Muscle strength & Tone: No palpable anomalies  Lumbar Spine Area Exam  Skin &  Axial Inspection:No masses, redness, or swelling Alignment:Symmetrical Functional LPF:XTKWIOXBD ROM Stability:No instability detected Muscle Tone/Strength:Functionally intact. No obvious neuro-muscular anomalies detected. Sensory (Neurological):Articular pain pattern Palpation:Complains of area being tender to palpationBilateral Fist Percussion Test Provocative Tests: Lumbar Hyperextension and rotation test:Positivebilaterally for facet joint pain. Lumbar Lateral bending test:Positivedue to pain. Patrick's Maneuver:evaluation deferred today  Gait & Posture Assessment  Ambulation:Unassisted Gait:Relatively normal for age and body habitus Posture:WNL  Lower Extremity Exam    Side:Right lower extremity  Side:Left lower extremity  Stability:No instability observed  Stability:No instability observed  Skin & Extremity Inspection:Skin color, temperature, and hair growth are WNL. No peripheral edema or cyanosis. No masses, redness, swelling, asymmetry, or associated skin lesions. No contractures.  Skin & Extremity Inspection:Skin color, temperature, and hair growth are WNL. No peripheral edema or cyanosis. No masses, redness, swelling, asymmetry, or  associated skin lesions. No contractures.  Functional ZHG:[id number] ROM   Functional QIW:[id number] ROM   Muscle Tone/Strength:Functionally intact. No obvious neuro-muscular anomalies detected.  Muscle Tone/Strength:Functionally intact. No obvious neuro-muscular anomalies detected.  Sensory (Neurological):Unimpaired  Sensory (Neurological):Unimpaired  Palpation:No palpable anomalies  Palpation:No palpable anomalies   Assessment   Status Diagnosis  Controlled Controlled Controlled 1. Spondylosis without myelopathy or radiculopathy, lumbar region   2. Chronic pain syndrome   3. Long term current use of opiate analgesic   4. Fibromyalgia   5. TMJ pain dysfunction syndrome   6. Myofascial pain   7. Chronic fatigue   8. Controlled substance agreement signed   9. Recurrent major depressive disorder, remission status unspecified ([place])      Updated Problems: Problem  Chronic Bilateral Low Back Pain Without Sciatica  Chronic Pain Syndrome  Long Term Current Use of Opiate Analgesic  Spondylosis Without Myelopathy Or Radiculopathy, Lumbar Region  Hypothyroidism  Rheumatoid Arthritis ([place])  Depression  Fibromyalgia  TMJ Pain Dysfunction Syndrome  H/O Juvenile Rheumatoid Arthritis   Overview:  a. Negative ANA.    b. Negative rheumatoid factor.    c. Previous treatment with nonsteroidals and Percocet.    d. Normal hand films 10/04.    e. MRI - TMJs 05/2004 with articular disk attenuation.   Formatting of this note might be different from the original. a. Negative ANA.    b. Negative rheumatoid factor.    c. Previous treatment with nonsteroidals and Percocet.    d. Normal hand films 10/04.    e. MRI - TMJs 05/2004 with articular disk attenuation.     Plan of Care  Ms. JENAY MORICI has a current medication list which includes the following long-term medication(s): albuterol, albuterol, levothyroxine, and  metformin.  Pharmacotherapy (Medications Ordered): Meds ordered this encounter  Medications  . oxyCODONE-acetaminophen (PERCOCET) 5-325 MG tablet    Sig: Take 1 tablet by mouth every 6 (six) hours as needed for severe pain. Must last 30 days.    Dispense:  120 tablet    Refill:  0    [place] STOP ACT - Not applicable. Fill one day early if pharmacy is closed on scheduled refill date.  Marland Kitchen oxyCODONE-acetaminophen (PERCOCET) 5-325 MG tablet    Sig: Take 1 tablet by mouth every 6 (six) hours as needed for severe pain. Must last 30 days.    Dispense:  120 tablet    Refill:  0    [place] STOP ACT - Not applicable. Fill one day early if pharmacy is closed on scheduled refill date.  Marland Kitchen oxyCODONE-acetaminophen (PERCOCET) 5-325 MG tablet    Sig: Take 1 tablet by mouth every  6 (six) hours as needed for severe pain. Must last 30 days.    Dispense:  120 tablet    Refill:  0    [place] STOP ACT - Not applicable. Fill one day early if pharmacy is closed on scheduled refill date.   Orders:  Orders Placed This Encounter  Procedures  . ToxASSURE Select 13 (MW), Urine    Volume: 30 ml(s). Minimum 3 ml of urine is needed. Document temperature of fresh sample. Indications: Long term (current) use of opiate analgesic [phone])    Order Specific Question:   Release to patient    Answer:   Immediate   Follow-up plan:   Return in about 3 months (around 01/15/2020) for Medication Management, in person.   Recent Visits No visits were found meeting these conditions. Showing recent visits within past 90 days and meeting all other requirements Today's Visits Date Type Provider Dept  10/15/19 Office Visit Gillis Santa, MD [facility]  Showing today's visits and meeting all other requirements Future Appointments No visits were found meeting these conditions. Showing future appointments within next 90 days and meeting all other requirements  I discussed the assessment and treatment plan with the patient. The  patient was provided an opportunity to ask questions and all were answered. The patient agreed with the plan and demonstrated an understanding of the instructions.  Patient advised to call back or seek an in-person evaluation if the symptoms or condition worsens.  Duration of encounter:57mnutes.  Note by: BGillis Santa MD Date: 10/15/2019; Time: 2:54 PM

## 2019-10-18 LAB — TOXASSURE SELECT 13 (MW), URINE

## 2019-10-22 ENCOUNTER — Ambulatory Visit: Payer: Medicaid Other

## 2019-10-22 NOTE — Chronic Care Management (AMB) (Signed)
  Care Management   Follow Up Note   10/22/2019 Name: [name] MRN: [id number] DOB: [birth year]  Referred by: Valerie Roys, DO Reason for referral : Care Coordination   [name] is a 39 y.o. year old female who is a primary care patient of Valerie Roys, DO. The care management team was consulted for assistance with care management and care coordination needs.    Review of patient status, including review of consultants reports, relevant laboratory and other test results, and collaboration with appropriate care team members and the patient's provider was performed as part of comprehensive patient evaluation and provision of chronic care management services.    LCSW completed CCM outreach attempt today but was unable to reach patient successfully. A HIPPA compliant voice message was left encouraging patient to return call once available. LCSW rescheduled CCM SW appointment as well.  A HIPPA compliant phone message was left for the patient providing contact information and requesting a return call.   Eula Fried, BSW, MSW, [facility] Care Management [email or web address] Phone: [phone]

## 2019-10-31 ENCOUNTER — Ambulatory Visit: Payer: Medicare HMO | Admitting: *Deleted

## 2019-11-06 ENCOUNTER — Ambulatory Visit: Payer: Medicare HMO | Admitting: *Deleted

## 2019-11-06 IMAGING — US US ABDOMEN LIMITED
1 series · 14 of 25 positions shown · non-contrast
Comparison: None.

CLINICAL DATA: Right upper quadrant pain for 2 weeks, nausea.

EXAM:
ULTRASOUND ABDOMEN LIMITED RIGHT UPPER QUADRANT

[Series 1: us abdomen limited · 0.20mm/px · 14 of 46 slices shown]
[im 1/46]
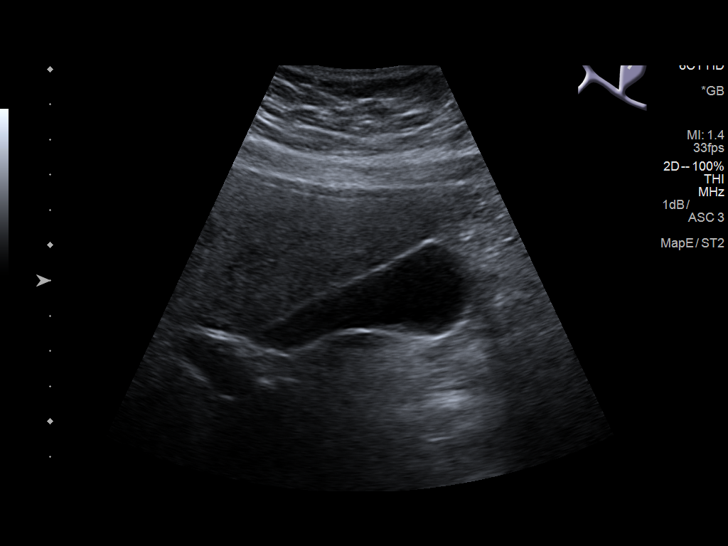
[im 4/46]
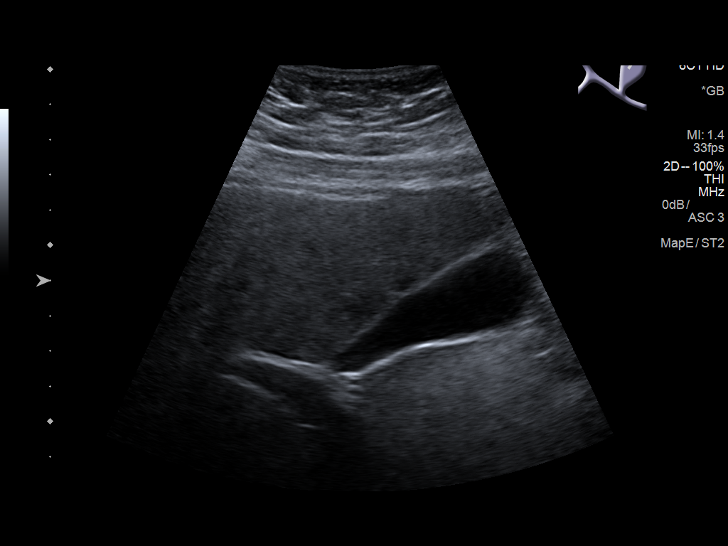
[im 8/46]
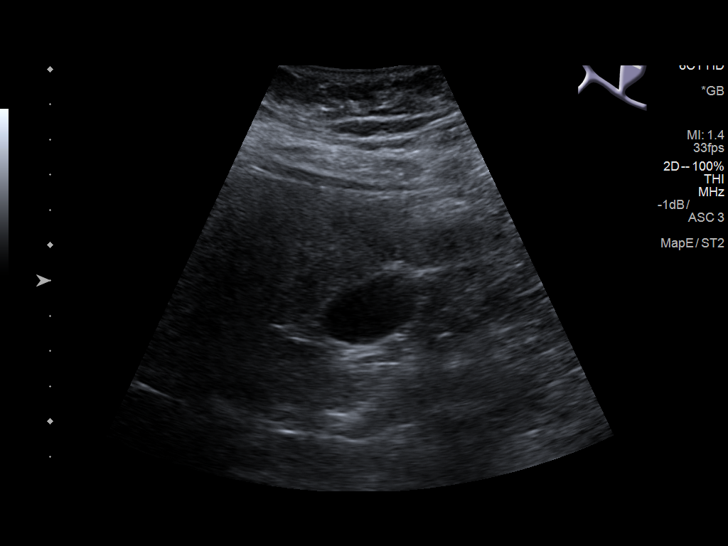
[im 12/46]
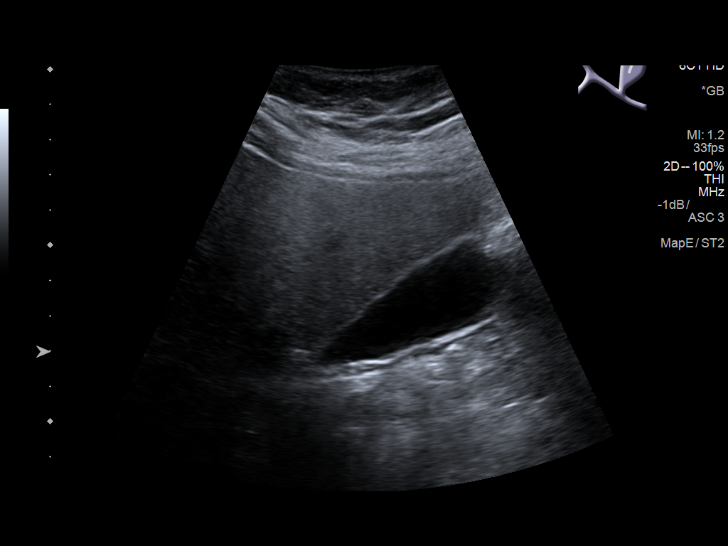
[im 16/46]
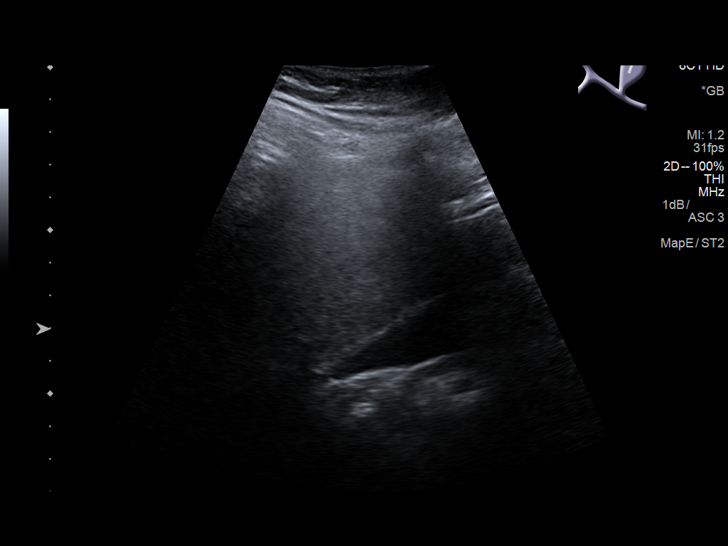
[im 17/46]
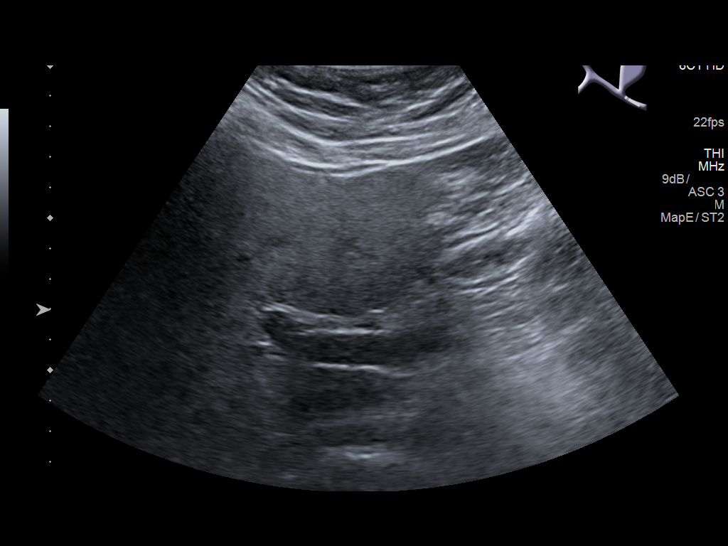
[im 21/46]
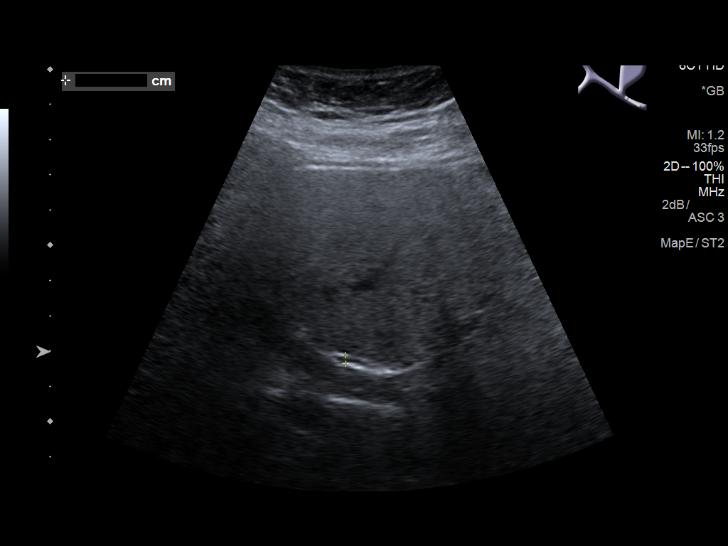
[im 25/46]
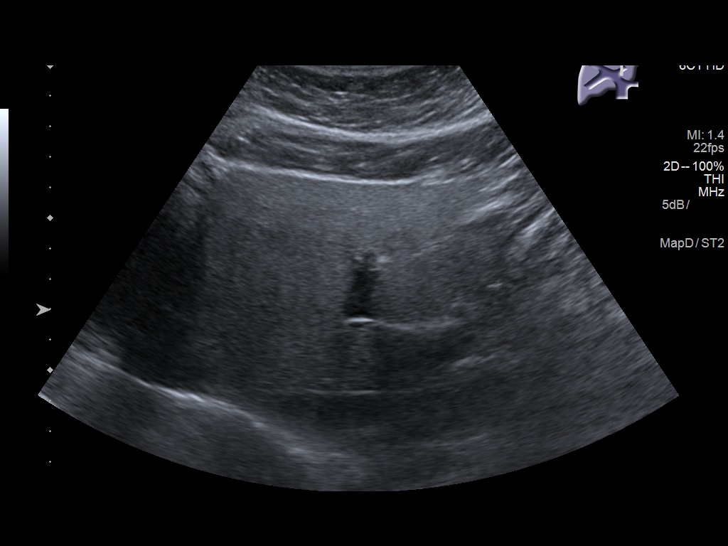
[im 29/46]
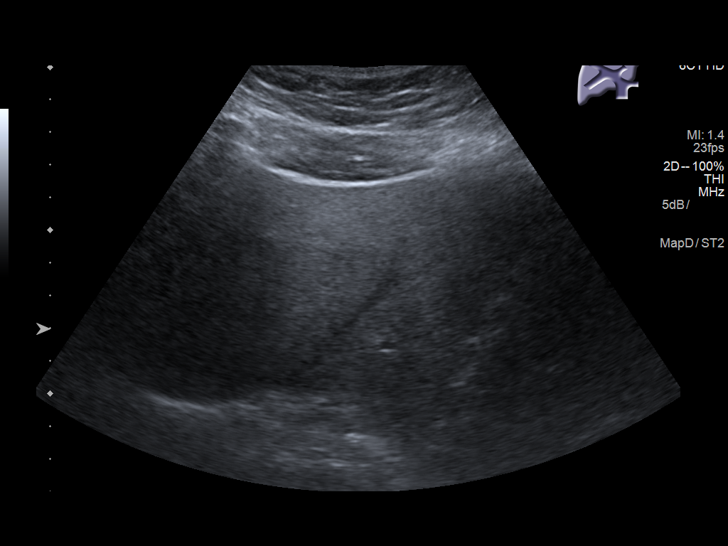
[im 31/46]
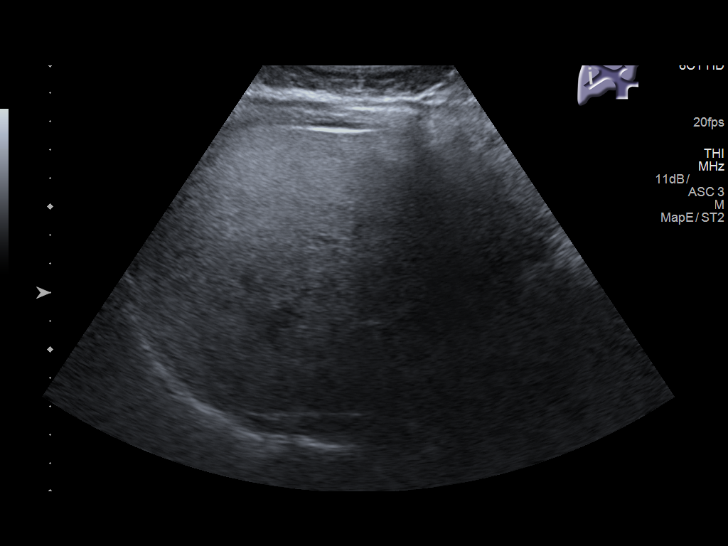
[im 34/46]
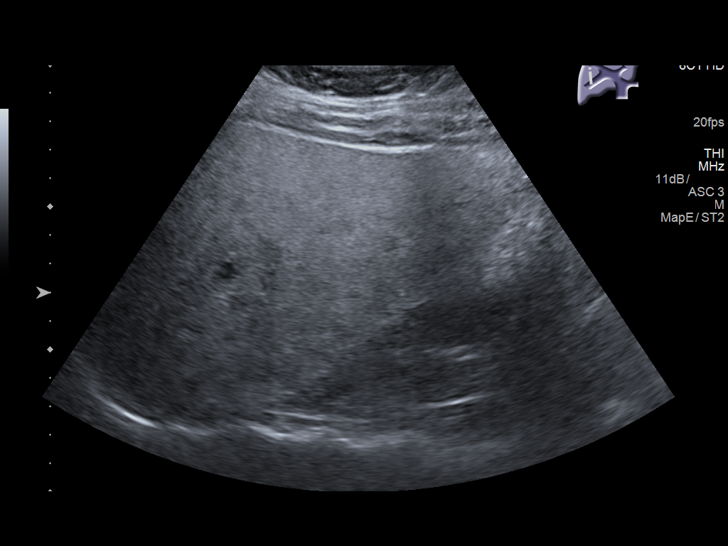
[im 38/46]
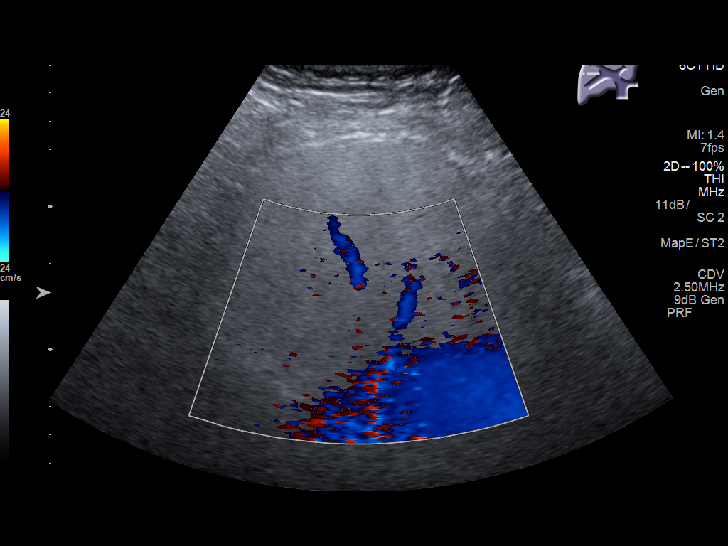
[im 42/46]
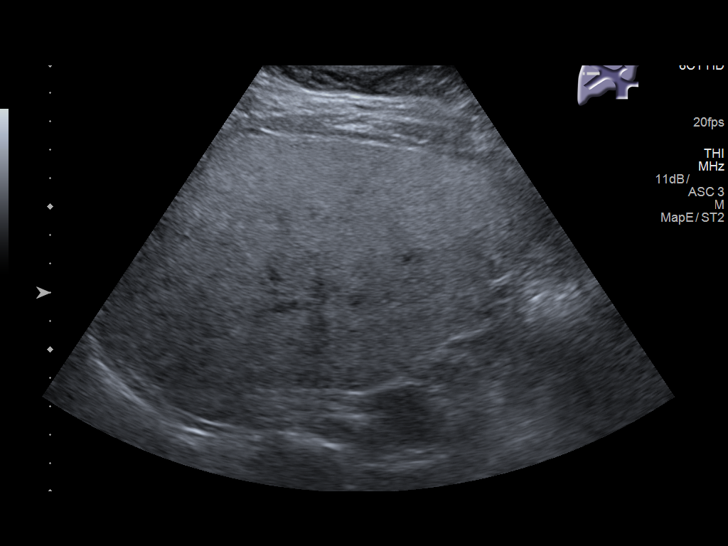
[im 46/46]
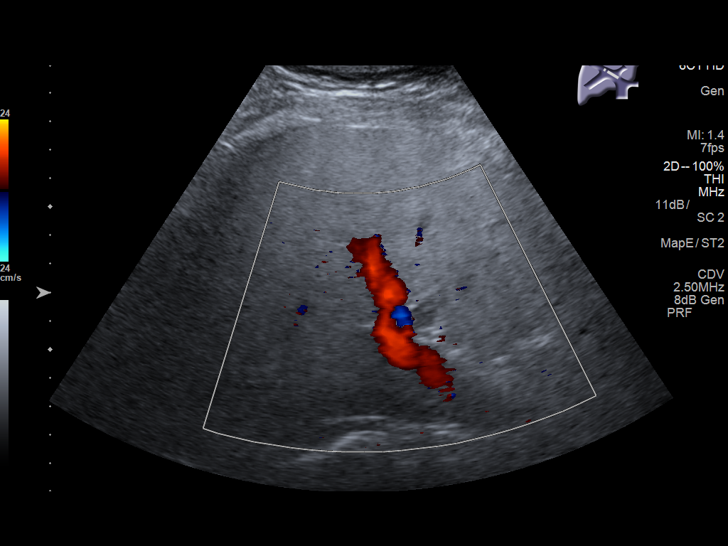

[14 of 25 positions shown; findings below may reference images not displayed]

FINDINGS: Gallbladder:

No gallstones or wall thickening visualized. No sonographic Murphy
sign noted by sonographer.

Common bile duct:

Diameter: Normal at 3 mm

Liver:

No focal lesion identified. Diffusely echogenic indicating fatty
infiltration. Portal vein is patent on color Doppler imaging with
normal direction of blood flow towards the liver.
IMPRESSION: 1. No acute findings.
2. Fatty infiltration of the liver.

## 2019-11-18 ENCOUNTER — Telehealth: Payer: Medicaid Other

## 2019-11-18 ENCOUNTER — Telehealth: Payer: Self-pay | Admitting: General Practice

## 2019-11-18 NOTE — Telephone Encounter (Signed)
  Chronic Care Management   Outreach Note  11/18/2019 Name: TERI LEGACY MRN: [id number] DOB: 1980/11/04  Referred by: Valerie Roys, DO Reason for referral : Chronic Care Management (RNCM Follow up call for Chronic Disease Management and Care Coordination Needs)   An unsuccessful telephone outreach was attempted today. The patient was referred to the case management team for assistance with care management and care coordination.   Follow Up Plan: A HIPPA compliant phone message was left for the patient providing contact information and requesting a return call.   Noreene Larsson RN, MSN, [facility] Mobile: [phone]

## 2019-11-19 ENCOUNTER — Telehealth: Payer: Self-pay | Admitting: Family Medicine

## 2019-11-19 ENCOUNTER — Telehealth: Payer: Self-pay | Admitting: General Practice

## 2019-11-19 NOTE — Telephone Encounter (Signed)
  Chronic Care Management   Outreach Note  11/19/2019 Name: DALAYAH DEAHL MRN: [id number] DOB: May 17, 1980  Referred by: Valerie Roys, DO Reason for referral : Chronic Care Management (RNCM call back after patient left a VM asking for a call back)   An unsuccessful telephone outreach was attempted today. The patient was referred to the case management team for assistance with care management and care coordination.   Follow Up Plan: A HIPPA compliant phone message was left for the patient providing contact information and requesting a return call.   Noreene Larsson RN, MSN, [facility] Mobile: [phone]

## 2019-11-19 NOTE — Telephone Encounter (Signed)
Lvm to see when she would like to reschedule this too.

## 2019-11-19 NOTE — Telephone Encounter (Signed)
-----   Message from Vanita Ingles sent at 11/19/2019  2:27 PM EDT ----- Regarding: Please cancel appointment with Dr. Wynetta Emery for 9-14- exposed to covid and has to quarantine The patient called and left a VM that she can not come to the appointment with Dr. Wynetta Emery next week on the 14th her daughter has tested positive for COVID and they have to quarantine. I tried calling her back but got VM. Thanks, Pam

## 2019-11-24 DIAGNOSIS — E860 Dehydration: Secondary | ICD-10-CM | POA: Diagnosis not present

## 2019-11-24 DIAGNOSIS — E039 Hypothyroidism, unspecified: Secondary | ICD-10-CM | POA: Diagnosis not present

## 2019-11-24 DIAGNOSIS — R509 Fever, unspecified: Secondary | ICD-10-CM | POA: Diagnosis not present

## 2019-11-24 DIAGNOSIS — R197 Diarrhea, unspecified: Secondary | ICD-10-CM | POA: Diagnosis not present

## 2019-11-24 DIAGNOSIS — R05 Cough: Secondary | ICD-10-CM | POA: Diagnosis not present

## 2019-11-24 DIAGNOSIS — Z79890 Hormone replacement therapy (postmenopausal): Secondary | ICD-10-CM | POA: Diagnosis not present

## 2019-11-24 DIAGNOSIS — Z9049 Acquired absence of other specified parts of digestive tract: Secondary | ICD-10-CM | POA: Diagnosis not present

## 2019-11-24 DIAGNOSIS — R519 Headache, unspecified: Secondary | ICD-10-CM | POA: Diagnosis not present

## 2019-11-24 DIAGNOSIS — U071 COVID-19: Secondary | ICD-10-CM | POA: Diagnosis not present

## 2019-11-24 DIAGNOSIS — E119 Type 2 diabetes mellitus without complications: Secondary | ICD-10-CM | POA: Diagnosis not present

## 2019-11-24 NOTE — Telephone Encounter (Signed)
Pt canceled via automated system

## 2019-11-25 ENCOUNTER — Ambulatory Visit: Payer: Medicare HMO | Admitting: Family Medicine

## 2019-11-27 NOTE — Telephone Encounter (Signed)
Pt has been r/s  

## 2019-11-29 DIAGNOSIS — R509 Fever, unspecified: Secondary | ICD-10-CM | POA: Diagnosis not present

## 2019-11-29 DIAGNOSIS — R6520 Severe sepsis without septic shock: Secondary | ICD-10-CM | POA: Diagnosis not present

## 2019-11-29 DIAGNOSIS — J9601 Acute respiratory failure with hypoxia: Secondary | ICD-10-CM | POA: Diagnosis not present

## 2019-11-29 DIAGNOSIS — M79605 Pain in left leg: Secondary | ICD-10-CM | POA: Diagnosis not present

## 2019-11-29 DIAGNOSIS — I9589 Other hypotension: Secondary | ICD-10-CM | POA: Diagnosis not present

## 2019-11-29 DIAGNOSIS — I10 Essential (primary) hypertension: Secondary | ICD-10-CM | POA: Diagnosis not present

## 2019-11-29 DIAGNOSIS — M7989 Other specified soft tissue disorders: Secondary | ICD-10-CM | POA: Diagnosis not present

## 2019-11-29 DIAGNOSIS — J1282 Pneumonia due to coronavirus disease 2019: Secondary | ICD-10-CM | POA: Diagnosis not present

## 2019-11-29 DIAGNOSIS — D6869 Other thrombophilia: Secondary | ICD-10-CM | POA: Diagnosis not present

## 2019-11-29 DIAGNOSIS — E1165 Type 2 diabetes mellitus with hyperglycemia: Secondary | ICD-10-CM | POA: Diagnosis not present

## 2019-11-29 DIAGNOSIS — R05 Cough: Secondary | ICD-10-CM | POA: Diagnosis not present

## 2019-11-29 DIAGNOSIS — E039 Hypothyroidism, unspecified: Secondary | ICD-10-CM | POA: Diagnosis not present

## 2019-11-29 DIAGNOSIS — E43 Unspecified severe protein-calorie malnutrition: Secondary | ICD-10-CM | POA: Diagnosis not present

## 2019-11-29 DIAGNOSIS — R0600 Dyspnea, unspecified: Secondary | ICD-10-CM | POA: Diagnosis not present

## 2019-11-29 DIAGNOSIS — R918 Other nonspecific abnormal finding of lung field: Secondary | ICD-10-CM | POA: Diagnosis not present

## 2019-11-29 DIAGNOSIS — A4189 Other specified sepsis: Secondary | ICD-10-CM | POA: Diagnosis not present

## 2019-11-29 DIAGNOSIS — R69 Illness, unspecified: Secondary | ICD-10-CM | POA: Diagnosis not present

## 2019-11-29 DIAGNOSIS — G8929 Other chronic pain: Secondary | ICD-10-CM | POA: Diagnosis not present

## 2019-11-29 DIAGNOSIS — E119 Type 2 diabetes mellitus without complications: Secondary | ICD-10-CM | POA: Diagnosis not present

## 2019-11-29 DIAGNOSIS — M79604 Pain in right leg: Secondary | ICD-10-CM | POA: Diagnosis not present

## 2019-11-29 DIAGNOSIS — U071 COVID-19: Secondary | ICD-10-CM | POA: Diagnosis not present

## 2019-11-30 DIAGNOSIS — M79605 Pain in left leg: Secondary | ICD-10-CM | POA: Diagnosis not present

## 2019-11-30 DIAGNOSIS — U071 COVID-19: Secondary | ICD-10-CM | POA: Diagnosis not present

## 2019-11-30 DIAGNOSIS — M79604 Pain in right leg: Secondary | ICD-10-CM | POA: Diagnosis not present

## 2019-11-30 DIAGNOSIS — J9601 Acute respiratory failure with hypoxia: Secondary | ICD-10-CM | POA: Diagnosis not present

## 2019-11-30 DIAGNOSIS — M7989 Other specified soft tissue disorders: Secondary | ICD-10-CM | POA: Diagnosis not present

## 2019-11-30 DIAGNOSIS — E039 Hypothyroidism, unspecified: Secondary | ICD-10-CM | POA: Diagnosis not present

## 2019-11-30 DIAGNOSIS — G8929 Other chronic pain: Secondary | ICD-10-CM | POA: Diagnosis not present

## 2019-11-30 DIAGNOSIS — J1282 Pneumonia due to coronavirus disease 2019: Secondary | ICD-10-CM | POA: Diagnosis not present

## 2019-12-01 DIAGNOSIS — G8929 Other chronic pain: Secondary | ICD-10-CM | POA: Diagnosis not present

## 2019-12-01 DIAGNOSIS — U071 COVID-19: Secondary | ICD-10-CM | POA: Diagnosis not present

## 2019-12-01 DIAGNOSIS — J1282 Pneumonia due to coronavirus disease 2019: Secondary | ICD-10-CM | POA: Diagnosis not present

## 2019-12-01 DIAGNOSIS — E1165 Type 2 diabetes mellitus with hyperglycemia: Secondary | ICD-10-CM | POA: Diagnosis not present

## 2019-12-01 DIAGNOSIS — J9601 Acute respiratory failure with hypoxia: Secondary | ICD-10-CM | POA: Diagnosis not present

## 2019-12-01 DIAGNOSIS — I10 Essential (primary) hypertension: Secondary | ICD-10-CM | POA: Diagnosis not present

## 2019-12-01 DIAGNOSIS — E039 Hypothyroidism, unspecified: Secondary | ICD-10-CM | POA: Diagnosis not present

## 2019-12-02 DIAGNOSIS — J9601 Acute respiratory failure with hypoxia: Secondary | ICD-10-CM | POA: Diagnosis not present

## 2019-12-02 DIAGNOSIS — U071 COVID-19: Secondary | ICD-10-CM | POA: Diagnosis not present

## 2019-12-02 DIAGNOSIS — J1282 Pneumonia due to coronavirus disease 2019: Secondary | ICD-10-CM | POA: Diagnosis not present

## 2019-12-02 DIAGNOSIS — E039 Hypothyroidism, unspecified: Secondary | ICD-10-CM | POA: Diagnosis not present

## 2019-12-02 DIAGNOSIS — E119 Type 2 diabetes mellitus without complications: Secondary | ICD-10-CM | POA: Diagnosis not present

## 2019-12-02 DIAGNOSIS — G8929 Other chronic pain: Secondary | ICD-10-CM | POA: Diagnosis not present

## 2019-12-03 DIAGNOSIS — J1282 Pneumonia due to coronavirus disease 2019: Secondary | ICD-10-CM | POA: Diagnosis not present

## 2019-12-03 DIAGNOSIS — G8929 Other chronic pain: Secondary | ICD-10-CM | POA: Diagnosis not present

## 2019-12-03 DIAGNOSIS — E119 Type 2 diabetes mellitus without complications: Secondary | ICD-10-CM | POA: Diagnosis not present

## 2019-12-03 DIAGNOSIS — E43 Unspecified severe protein-calorie malnutrition: Secondary | ICD-10-CM | POA: Diagnosis not present

## 2019-12-03 DIAGNOSIS — U071 COVID-19: Secondary | ICD-10-CM | POA: Diagnosis not present

## 2019-12-03 DIAGNOSIS — J9601 Acute respiratory failure with hypoxia: Secondary | ICD-10-CM | POA: Diagnosis not present

## 2019-12-03 DIAGNOSIS — E039 Hypothyroidism, unspecified: Secondary | ICD-10-CM | POA: Diagnosis not present

## 2019-12-04 DIAGNOSIS — J1282 Pneumonia due to coronavirus disease 2019: Secondary | ICD-10-CM | POA: Diagnosis not present

## 2019-12-04 DIAGNOSIS — J9601 Acute respiratory failure with hypoxia: Secondary | ICD-10-CM | POA: Diagnosis not present

## 2019-12-04 DIAGNOSIS — U071 COVID-19: Secondary | ICD-10-CM | POA: Diagnosis not present

## 2019-12-04 DIAGNOSIS — E039 Hypothyroidism, unspecified: Secondary | ICD-10-CM | POA: Diagnosis not present

## 2019-12-04 DIAGNOSIS — G8929 Other chronic pain: Secondary | ICD-10-CM | POA: Diagnosis not present

## 2019-12-04 DIAGNOSIS — E43 Unspecified severe protein-calorie malnutrition: Secondary | ICD-10-CM | POA: Diagnosis not present

## 2019-12-04 DIAGNOSIS — E119 Type 2 diabetes mellitus without complications: Secondary | ICD-10-CM | POA: Diagnosis not present

## 2019-12-05 DIAGNOSIS — E039 Hypothyroidism, unspecified: Secondary | ICD-10-CM | POA: Diagnosis not present

## 2019-12-05 DIAGNOSIS — E43 Unspecified severe protein-calorie malnutrition: Secondary | ICD-10-CM | POA: Diagnosis not present

## 2019-12-05 DIAGNOSIS — R69 Illness, unspecified: Secondary | ICD-10-CM | POA: Diagnosis not present

## 2019-12-05 DIAGNOSIS — G8929 Other chronic pain: Secondary | ICD-10-CM | POA: Diagnosis not present

## 2019-12-05 DIAGNOSIS — J9601 Acute respiratory failure with hypoxia: Secondary | ICD-10-CM | POA: Diagnosis not present

## 2019-12-05 DIAGNOSIS — U071 COVID-19: Secondary | ICD-10-CM | POA: Diagnosis not present

## 2019-12-05 DIAGNOSIS — J1282 Pneumonia due to coronavirus disease 2019: Secondary | ICD-10-CM | POA: Diagnosis not present

## 2019-12-05 DIAGNOSIS — E119 Type 2 diabetes mellitus without complications: Secondary | ICD-10-CM | POA: Diagnosis not present

## 2019-12-10 ENCOUNTER — Telehealth: Payer: Self-pay | Admitting: Family Medicine

## 2019-12-10 NOTE — Telephone Encounter (Signed)
She can get the covid shot at the pharmacy and we can get her her pneumonia shot when she comes in

## 2019-12-10 NOTE — Telephone Encounter (Signed)
Spoke with pt she is scheduled for 10/21 she wanted to know about how long she must wait to get covid shot advised per Dr. Wynetta Emery she recommends 2 weeks unless she got antibody infusions then she would recommend 3 months. Pt understood.  Copied from [place] [phone]. Topic: General - Other >> Dec 10, 2019  4:10 PM Leward Quan A wrote: Reason for CRM: Patient  called in and scheduled an appointment with Dr Wynetta Emery. Patient was in hospital ICU with covid and Pneumonia but say that she was told by Dr to get her covid vaccine and Pneumonia vaccine in the next 3 weeks. Please call patient to clarify Ph# [phone]

## 2019-12-11 NOTE — Telephone Encounter (Signed)
See below

## 2019-12-12 ENCOUNTER — Ambulatory Visit: Payer: Medicaid Other

## 2019-12-12 NOTE — Chronic Care Management (AMB) (Signed)
  Care Management   Follow Up Note   12/12/2019 Name: Sharon Rodriguez MRN: [id number] DOB: 1980-06-07  Referred by: Valerie Roys, DO Reason for referral : Care Coordination   Sharon Rodriguez is a 39 y.o. year old female who is a primary care patient of Valerie Roys, DO. The care management team was consulted for assistance with care management and care coordination needs.    Review of patient status, including review of consultants reports, relevant laboratory and other test results, and collaboration with appropriate care team members and the patient's provider was performed as part of comprehensive patient evaluation and provision of chronic care management services.    LCSW completed CCM outreach attempt today but was unable to reach patient successfully. A HIPPA compliant voice message was left encouraging patient to return call once available. LCSW will reschedule CCM SW appointment with patient as well.  Eula Fried, BSW, MSW, [facility] Care Management [email or web address] Phone: [phone]

## 2019-12-12 NOTE — Telephone Encounter (Signed)
Called and LVM letting patient know what Dr. Johnson said.  °

## 2019-12-18 ENCOUNTER — Ambulatory Visit: Payer: Medicare Other | Admitting: Gastroenterology

## 2020-01-01 ENCOUNTER — Other Ambulatory Visit: Payer: Self-pay

## 2020-01-01 ENCOUNTER — Ambulatory Visit (INDEPENDENT_AMBULATORY_CARE_PROVIDER_SITE_OTHER): Payer: Medicare HMO | Admitting: Family Medicine

## 2020-01-01 ENCOUNTER — Encounter: Payer: Self-pay | Admitting: Family Medicine

## 2020-01-01 VITALS — BP 113/79 | HR 105 | Temp 98.0°F | Resp 16 | Ht 61.0 in | Wt 182.2 lb

## 2020-01-01 DIAGNOSIS — U099 Post covid-19 condition, unspecified: Secondary | ICD-10-CM | POA: Diagnosis not present

## 2020-01-01 DIAGNOSIS — J1282 Pneumonia due to coronavirus disease 2019: Secondary | ICD-10-CM | POA: Diagnosis not present

## 2020-01-01 DIAGNOSIS — Z23 Encounter for immunization: Secondary | ICD-10-CM

## 2020-01-01 DIAGNOSIS — U071 COVID-19: Secondary | ICD-10-CM

## 2020-01-01 NOTE — Patient Instructions (Addendum)
Moderna vaccine- you can get it 12/23

## 2020-01-01 NOTE — Assessment & Plan Note (Signed)
Continues with mild symptoms. Will get vaccine when she is able to due to monoclonal antibodies (December). Discussed referral to post-covid clinic- she would like to hold off for now and see how she's doing in January. Continue to monitor. Call with any concerns.

## 2020-01-01 NOTE — Progress Notes (Signed)
BP 113/79 (BP Location: Left Arm, Patient Position: Sitting, Cuff Size: Normal)   Pulse (!) 105   Temp 98 F (36.7 C) (Oral)   Resp 16   Ht 5\' 1"  (1.549 m)   Wt 182 lb 3.2 oz (82.6 kg)   SpO2 97%   BMI 34.43 kg/m    Subjective:    Patient ID: Sharon Rodriguez, female    DOB: 03-Feb-1981, 39 y.o.   MRN: [id number]  HPI: Sharon Rodriguez is a 39 y.o. female  Chief Complaint  Patient presents with  . Hospitalization Follow-up    11/29/19-12/05/19 [facility] due pneumonia from COVID 19   Transition of [facility] Follow up.   Hospital/Facility: [facility] D/C Physician: Dr. Macario Carls D/C Date: 12/05/19  Records Requested: 01/01/20 Records Received: 01/01/20 Records Reviewed: 01/01/20  Diagnoses on Discharge: Pneumonia due to COVID 19,  Discharge Diagnoses (All present on admission):  1. COVID-19 pneumonia 2. Acute hypoxic respiratory failure secondary to the above 3. Type 2 diabetes, well controlled with A1c 6.9% 4. Severe protein calorie malnutrition 5. Hypothyroidism 6. Chronic pain, fibromyalgia 7. ADHD    Date of interactive Contact within 48 hours of discharge: 12/06/19 Contact was through: phone  Date of 7 day or 14 day face-to-face visit:    Not done. Done 01/01/20  Outpatient Encounter Medications as of 01/01/2020  Medication Sig  . albuterol (PROVENTIL HFA;VENTOLIN HFA) 108 (90 Base) MCG/ACT inhaler Inhale 1 puff into the lungs every 6 (six) hours as needed for wheezing or shortness of breath.   Marland Kitchen albuterol (PROVENTIL) (2.5 MG/3ML) 0.083% nebulizer solution Frequency:Q4HPRN   Dosage:0.0     Instructions:  Note:Dose: 1  Dx:J42  . amphetamine-dextroamphetamine (ADDERALL) 30 MG tablet Take 1 tablet by mouth 2 (two) times daily.  Marland Kitchen levothyroxine (SYNTHROID) 75 MCG tablet Take 1 tablet (75 mcg total) by mouth daily. Synthroid brand name necessary  . metFORMIN (GLUCOPHAGE-XR) 500 MG 24 hr tablet Take 2 tablets (1,000 mg total) by mouth 2 (two) times daily.  . naproxen (NAPROSYN)  500 MG tablet Take 1 tablet (500 mg total) by mouth 2 (two) times daily with a meal.  . ondansetron (ZOFRAN ODT) 8 MG disintegrating tablet Take 1 tablet (8 mg total) by mouth every 8 (eight) hours as needed for nausea or vomiting.  Marland Kitchen oxyCODONE-acetaminophen (PERCOCET) 5-325 MG tablet Take 1 tablet by mouth every 6 (six) hours as needed for severe pain. Must last 30 days.  . Vitamin D, Ergocalciferol, (DRISDOL) 1.25 MG (50000 UNIT) CAPS capsule Take 1 capsule (50,000 Units total) by mouth once a week.   No facility-administered encounter medications on file as of 01/01/2020.  Per Hospitalist: "Hospital Course:  39 year old woman with history of hypothyroidism, type 2 diabetes, fibromyalgia on chronic opioids who was admitted for COVID-19 pneumonia. Her hospital course follows:  Date of symptom onset: 9/8 Date of positive SARS-CoV-2 PCR: 9/14 Vaccination status: Not vaccinated  *Acute hypoxemic respiratory failure due to COVID-19 pneumonia. Was admitted to stepdown unit where her peak oxygen requirement was high flow nasal cannula 40 LPM 60% FiO2. She was started on dexamethasone (9/18-9/24), remdesivir (9/19-9/22), and baricitinib (9/18-9/23). Her oxygen support was weaned to room air 9/23, the day prior to discharge.  COVID-19 vaccination has been recommended, and she should pursue this after discharge. Because more than 10 days have elapsed since onset of symptoms and she is improved, she does not require self-isolation at home. She does not meet criteria for anticoagulation on discharge.  She was enrolled in  the ACTIV-4c study prior to discharge and received her study medication.  *Diabetes type 2. A1C 6.9 on admission. Home metformin held and managed with subcutaneous insulin during admission due to hyperglycemia secondary to dexamethasone use. Discharged on home metformin.  *Chronic Pain. PDMP reviewed, verified. Continued on home oxycodone-acetaminophen 5-325mg  4 times daily PRN.  "   Diagnostic Tests Reviewed: Peripheral Vascular Lab   [address]  [place],  [address]   PVL VENOUS DUPLEX LOWER EXTREMITY BILATERAL  Patient Demographics Pt. Name: Sharon Rodriguez [facility] Location: PVL Inpatient Bedside  MRN:   [id number]       Sex:   F  DOB:  1981/01/05      Age:   69 years   Study Information  Authorizing     [phone] Mady Haagensen    Performed Time    11/30/2019 1:03:56  Provider Name    HUDSON                   PM  Ordering Physician Darylene Price    Patient Location   [facility]  Accession Number  [id number]     Technologist     Luan Pulling RVT  Diagnosis:                Assisting                      Technologist  Ordered Reason For Exam: R/oDVT in LLE  Indication: Pain Swelling  Risk Factors: (+Covid-19).  Anticoagulation: (Enoxaparin).  Protocol  The major deep veins from the inguinal ligament to the ankle are assessed for bilaterally for compressibility and color and spectral Doppler flow characteristics. The assessed veins include bilateral common femoral vein, femoral vein in the thigh, popliteal vein, and intramuscular calf veins. The iliac vein is assessed indirectly using Doppler waveform analysis. The great saphenous vein is assessed for compressibility at the saphenofemoral junction, and the small saphenous vein assessed for compressibility behind the knee.   Right Duplex Findings  All veins visualized appear fully compressible. Doppler flow signals demonstrate normal spontaneity, phasicity, and augmentation.   Left Duplex Findings  All veins visualized appear fully compressible. Doppler flow signals demonstrate normal spontaneity, phasicity, and augmentation.   Right Technical Summary  No evidence of deep venous obstructionin the lower extremity. No indirect evidence of obstruction proximal to the inguinal ligament.   Left  Technical Summary  No evidence of deep venous obstruction in the lower extremity. No indirect evidence of obstruction proximal to the inguinal ligament.   Final Interpretation  Right  There is no evidence of DVT in the lower extremity.  Left  There is no evidence of DVT in the lower extremity.   Very mild bibasilar infiltrates.    Electronically Signed  By: Virgina Norfolk M.D.  On: 11/24/2019 23:45 Narrative Performed by [facility] RAD CLINICAL DATA: Cough. COVID positive.   EXAM:  CHEST - 2 VIEW   COMPARISON: Jul 18, 2015   FINDINGS:  Very mild ill-defined infiltrates are seen involving the bilateral  lung bases. There is no evidence of a pleural effusion or  pneumothorax. The heart size and mediastinal contours are within  normal limits. The visualized skeletal structures are unremarkable.  Radiopaque surgical clips are seen overlying the right upper  quadrant. Procedure Note  Interface, Rad Results In - 11/24/2019 11:48 PM EDT  Formatting of this note might be different from the original.  CLINICAL DATA: Cough. COVID positive.   EXAM:  CHEST - 2 VIEW   COMPARISON: Jul 18, 2015   FINDINGS:  Very mild ill-defined infiltrates are seen involving the bilateral  lung bases. There is no evidence of a pleural effusion or  pneumothorax. The heart size and mediastinal contours are within  normal limits. The visualized skeletal structures are unremarkable.  Radiopaque surgical clips are seen overlying the right upper  quadrant.   IMPRESSION:  Very mild bibasilar infiltrates.    Disposition: Home  Consults: None  Discharge Instructions: Follow up here and get COVID vaccine  Disease/illness Education: Discussed today  Home Health/Community Services Discussions/Referrals: N/A  Establishment or re-establishment of referral orders for community resources: N/A  Discussion with other health care providers: N/A  Assessment and Support of treatment regimen  adherence: Good  Appointments Coordinated with: Patient  Education for self-management, independent living, and ADLs: Discussed today  Since getting out of the hospital, she has been doing OK. She feels like she's about 1/2 way there. She's very tired, brain fog, vision still a little blurred, feeling really weak. Still doesn't have any taste or smell. Still having a sharp pain when she is breathing and hard to take a deep breath. She does feel like she's starting to get back to herself, but very slowly. She is otherwise doing OK.  Relevant past medical, surgical, family and social history reviewed and updated as indicated. Interim medical history since our last visit reviewed. Allergies and medications reviewed and updated.  Review of Systems  Constitutional: Positive for fatigue. Negative for activity change, appetite change, chills, diaphoresis, fever and unexpected weight change.  Respiratory: Positive for chest tightness. Negative for apnea, cough, choking, shortness of breath, wheezing and stridor.   Cardiovascular: Negative.   Gastrointestinal: Negative.   Musculoskeletal: Positive for arthralgias, back pain and myalgias. Negative for gait problem, joint swelling, neck pain and neck stiffness.  Skin: Negative.   Neurological: Negative.   Psychiatric/Behavioral: Positive for confusion and decreased concentration. Negative for agitation, behavioral problems, dysphoric mood, hallucinations, self-injury, sleep disturbance and suicidal ideas. The patient is not nervous/anxious and is not hyperactive.     Per HPI unless specifically indicated above     Objective:    BP 113/79 (BP Location: Left Arm, Patient Position: Sitting, Cuff Size: Normal)   Pulse (!) 105   Temp 98 F (36.7 C) (Oral)   Resp 16   Ht 5\' 1"  (1.549 m)   Wt 182 lb 3.2 oz (82.6 kg)   SpO2 97%   BMI 34.43 kg/m   Wt Readings from Last 3 Encounters:  01/01/20 182 lb 3.2 oz (82.6 kg)  10/15/19 186 lb (84.4 kg)   10/06/19 186 lb 12.8 oz (84.7 kg)    Physical Exam Vitals and nursing note reviewed.  Constitutional:      General: She is not in acute distress.    Appearance: Normal appearance. She is not ill-appearing, toxic-appearing or diaphoretic.  HENT:     Head: Normocephalic and atraumatic.     Right Ear: External ear normal.     Left Ear: External ear normal.     Nose: Nose normal.     Mouth/Throat:     Mouth: Mucous membranes are moist.     Pharynx: Oropharynx is clear.  Eyes:     General: No scleral icterus.       Right eye: No discharge.        Left eye: No discharge.     Extraocular Movements: Extraocular movements intact.     Conjunctiva/sclera: Conjunctivae  normal.     Pupils: Pupils are equal, round, and reactive to light.  Cardiovascular:     Rate and Rhythm: Normal rate and regular rhythm.     Pulses: Normal pulses.     Heart sounds: Normal heart sounds. No murmur heard.  No friction rub. No gallop.   Pulmonary:     Effort: Pulmonary effort is normal. No respiratory distress.     Breath sounds: Normal breath sounds. No stridor. No wheezing, rhonchi or rales.  Chest:     Chest wall: No tenderness.  Musculoskeletal:        General: Normal range of motion.     Cervical back: Normal range of motion and neck supple.  Skin:    General: Skin is warm and dry.     Capillary Refill: Capillary refill takes less than 2 seconds.     Coloration: Skin is not jaundiced or pale.     Findings: No bruising, erythema, lesion or rash.  Neurological:     General: No focal deficit present.     Mental Status: She is alert and oriented to person, place, and time. Mental status is at baseline.  Psychiatric:        Mood and Affect: Mood normal.        Behavior: Behavior normal.        Thought Content: Thought content normal.        Judgment: Judgment normal.     Results for orders placed or performed in visit on 10/15/19  ToxASSURE Select 13 (MW), Urine  Result Value Ref Range    Summary Note       Assessment & Plan:   Problem List Items Addressed This Visit      Other   Post-COVID syndrome    Continues with mild symptoms. Will get vaccine when she is able to due to monoclonal antibodies (December). Discussed referral to post-covid clinic- she would like to hold off for now and see how she's doing in January. Continue to monitor. Call with any concerns.        Other Visit Diagnoses    Pneumonia due to COVID-19 virus    -  Primary   Resolved. Still with some post-covid symptoms. Continue to monitor. Call with any concerns.        Follow up plan: Return in about 2 months (around 03/02/2020).  >25 minutes spent with patient today

## 2020-01-15 ENCOUNTER — Other Ambulatory Visit: Payer: Self-pay

## 2020-01-15 ENCOUNTER — Ambulatory Visit
Payer: Medicare HMO | Attending: Student in an Organized Health Care Education/Training Program | Admitting: Student in an Organized Health Care Education/Training Program

## 2020-01-15 ENCOUNTER — Encounter: Payer: Self-pay | Admitting: Student in an Organized Health Care Education/Training Program

## 2020-01-15 VITALS — BP 96/51 | HR 96 | Resp 18 | Ht 61.0 in | Wt 187.0 lb

## 2020-01-15 DIAGNOSIS — M7918 Myalgia, other site: Secondary | ICD-10-CM | POA: Diagnosis not present

## 2020-01-15 DIAGNOSIS — Z79899 Other long term (current) drug therapy: Secondary | ICD-10-CM | POA: Insufficient documentation

## 2020-01-15 DIAGNOSIS — R5382 Chronic fatigue, unspecified: Secondary | ICD-10-CM | POA: Insufficient documentation

## 2020-01-15 DIAGNOSIS — Z79891 Long term (current) use of opiate analgesic: Secondary | ICD-10-CM | POA: Diagnosis not present

## 2020-01-15 DIAGNOSIS — M26629 Arthralgia of temporomandibular joint, unspecified side: Secondary | ICD-10-CM | POA: Insufficient documentation

## 2020-01-15 DIAGNOSIS — M797 Fibromyalgia: Secondary | ICD-10-CM | POA: Insufficient documentation

## 2020-01-15 DIAGNOSIS — G894 Chronic pain syndrome: Secondary | ICD-10-CM | POA: Diagnosis not present

## 2020-01-15 DIAGNOSIS — M47816 Spondylosis without myelopathy or radiculopathy, lumbar region: Secondary | ICD-10-CM | POA: Diagnosis not present

## 2020-01-15 MED ORDER — OXYCODONE-ACETAMINOPHEN 5-325 MG PO TABS: 1.0000 | ORAL_TABLET | Freq: Four times a day (QID) | ORAL | 0 refills | Status: DC | PRN

## 2020-01-15 MED ORDER — OXYCODONE-ACETAMINOPHEN 5-325 MG PO TABS: 1.0000 | ORAL_TABLET | Freq: Four times a day (QID) | ORAL | 0 refills | Status: AC | PRN

## 2020-01-15 NOTE — Progress Notes (Signed)
PROVIDER NOTE: Information contained herein reflects review and annotations entered in association with encounter. Interpretation of such information and data should be left to medically-trained personnel. Information provided to patient can be located elsewhere in the medical record under "Patient Instructions". Document created using STT-dictation technology, any transcriptional errors that may result from process are unintentional.    Patient: Sharon Rodriguez  Service Category: E/M  Provider: Gillis Santa, MD  DOB: 08-03-1980  DOS: 01/15/2020  Specialty: Interventional Pain Management  MRN: [id number]  Setting: Ambulatory outpatient  PCP: Valerie Roys, DO  Type: Established Patient    Referring Provider: Valerie Roys, DO  Location: Office  Delivery: Face-to-face     HPI  Sharon Rodriguez, a 39 y.o. year old female, is here today because of her Chronic pain syndrome [G89.4]. Sharon Rodriguez primary complain today is Fibromyalgia (low back, pelvis, buttocks) Last encounter: My last encounter with her was on 10/15/2019. Pertinent problems: Sharon Rodriguez has Hypothyroidism; Rheumatoid arthritis ([place]); Depression; Fibromyalgia; TMJ pain dysfunction syndrome; H/O juvenile rheumatoid arthritis; Spondylosis without myelopathy or radiculopathy, lumbar region; Chronic bilateral low back pain without sciatica; Chronic pain syndrome; and Long term current use of opiate analgesic on their pertinent problem list. Pain Assessment: Severity of Chronic pain is reported as a 7 /10. Location: Back Lower/buttocks. Onset: More than a month ago. Quality: Stabbing, Burning, Aching. Timing: Intermittent. Modifying factor(s): ice, medication, topicals, rest. Vitals:  height is 5' 1"  (1.549 m) and weight is 187 lb (84.8 kg). Her blood pressure is 96/51 (abnormal) and her pulse is 96. Her respiration is 18 and oxygen saturation is 98%.   Reason for encounter: medication management.   Since the patient's last visit with me,  she was hospitalized at [facility] for Covid pneumonia from September 18 through December 05, 2019.Marland Kitchen  She was placed on high flow nasal cannula.  Patient's pain is at baseline.  Patient continues multimodal pain regimen as prescribed.  States that it provides pain relief and improvement in functional status.   Pharmacotherapy Assessment   12/19/2019  10/15/2019   1  Oxycodone-Acetaminophen 5-325 120.00  30  Bi Lat  [id number]  786 (3732)  0/0  30.00 MME  Comm      Analgesic: Percocet 5 mg 4 times daily as needed, quantity 120/month; MME equals 30    Monitoring: [place] PMP: PDMP not reviewed this encounter.       Pharmacotherapy: No side-effects or adverse reactions reported. Compliance: No problems identified. Effectiveness: Clinically acceptable.  Ron, [place], RN  01/15/2020 11:13 AM  Sign when Signing Visit Nursing Pain Medication Assessment:  Safety precautions to be maintained throughout the outpatient stay will include: orient to surroundings, keep bed in low position, maintain call bell within reach at all times, provide assistance with transfer out of bed and ambulation.  Medication Inspection Compliance: Pill count conducted under aseptic conditions, in front of the patient. Neither the pills nor the bottle was removed from the patient's sight at any time. Once count was completed pills were immediately returned to the patient in their original bottle.  Medication: Oxycodone IR Pill/Patch Count: 15 of 120 pills remain Pill/Patch Appearance: Markings consistent with prescribed medication Bottle Appearance: Standard pharmacy container. Clearly labeled. Filled Date: 10 / 08 / 2021 Last Medication intake:  Today    UDS:  Summary  Date Value Ref Range Status  10/15/2019 Note  Final    Comment:    ==================================================================== [employer] Select 13 (MW) ==================================================================== Test  Result       Flag       Units  Drug Present and Declared for Prescription Verification   Oxycodone                      871          EXPECTED   ng/mg creat   Oxymorphone                    902          EXPECTED   ng/mg creat   Noroxycodone                   1166         EXPECTED   ng/mg creat   Noroxymorphone                 193          EXPECTED   ng/mg creat    Sources of oxycodone are scheduled prescription medications.    Oxymorphone, noroxycodone, and noroxymorphone are expected    metabolites of oxycodone. Oxymorphone is also available as a    scheduled prescription medication.  Drug Absent but Declared for Prescription Verification   Amphetamine                    Not Detected UNEXPECTED ng/mg creat ==================================================================== Test                      Result    Flag   Units      Ref Range   Creatinine              192              mg/dL      >=20 ==================================================================== Declared Medications:  The flagging and interpretation on this report are based on the  following declared medications.  Unexpected results may arise from  inaccuracies in the declared medications.   **Note: The testing scope of this panel includes these medications:   Amphetamine (Adderall)  Oxycodone (Percocet)   **Note: The testing scope of this panel does not include the  following reported medications:   Acetaminophen (Percocet)  Albuterol (Ventolin HFA)  Levothyroxine (Synthroid)  Metformin (Glucophage)  Naproxen (Naprosyn)  Ondansetron (Zofran)  Vitamin D2 (Drisdol) ==================================================================== For clinical consultation, please call [phone]. ====================================================================      ROS  Constitutional: Denies any fever or chills Gastrointestinal: No reported hemesis, hematochezia, vomiting, or acute GI  distress Musculoskeletal: Denies any acute onset joint swelling, redness, loss of ROM, or weakness Neurological: No reported episodes of acute onset apraxia, aphasia, dysarthria, agnosia, amnesia, paralysis, loss of coordination, or loss of consciousness  Medication Review  Vitamin D (Ergocalciferol), albuterol, amphetamine-dextroamphetamine, levothyroxine, metFORMIN, multivitamin, naproxen, ondansetron, and oxyCODONE-acetaminophen  History Review  Allergy: Sharon Rodriguez is allergic to ibuprofen and promethazine. Drug: Sharon Rodriguez  reports no history of drug use. Alcohol:  reports no history of alcohol use. Tobacco:  reports that she has never smoked. She has never used smokeless tobacco. Social: Sharon Rodriguez  reports that she has never smoked. She has never used smokeless tobacco. She reports that she does not drink alcohol and does not use drugs. Medical:  has a past medical history of ADD (attention deficit disorder), Anxiety, Asthma, Biliary dyskinesia (12/27/2017), Chronic bronchitis ([place]), Chronic fatigue, Chronic pain, Depression, Diabetes mellitus without complication ([place]), Fibromyalgia, GERD (gastroesophageal reflux disease), History of stomach  ulcers (2019), Hypothyroidism (01/27/2015), Learning disability, Osteoarthritis, Rheumatoid arthritis ([place]), Thyroid disease, and TMJ (dislocation of temporomandibular joint). Surgical: Sharon Rodriguez  has a past surgical history that includes Cesarean section; Esophagogastroduodenoscopy (egd) with propofol (N/A, 12/07/2017); and Cholecystectomy (N/A, 01/14/2018). Family: family history includes ADD / ADHD in her daughter and mother; Allergies in her daughter and daughter; Arthritis in her brother and paternal grandmother; COPD in her mother; Cancer in her maternal grandmother; Dementia in her paternal grandmother; Diabetes in her father and mother; Heart attack in her maternal grandfather; Heart disease in her maternal grandfather; Hyperlipidemia in her father;  Learning disabilities in her mother; Mental illness in her daughter, father, and sister; Stroke in her maternal grandfather; Thyroid disease in her daughter.  Laboratory Chemistry Profile   Renal Lab Results  Component Value Date   BUN 11 10/06/2019   CREATININE 0.70 10/06/2019   BCR 16 10/06/2019   GFRAA 124 10/06/2019   GFRNONAA 108 10/06/2019     Hepatic Lab Results  Component Value Date   AST 114 (H) 10/06/2019   ALT 57 (H) 10/06/2019   ALBUMIN 4.4 10/06/2019   ALKPHOS 75 10/06/2019   LIPASE 43 04/10/2017     Electrolytes Lab Results  Component Value Date   NA 139 10/06/2019   K 4.3 10/06/2019   CL 101 10/06/2019   CALCIUM 9.4 10/06/2019     Bone Lab Results  Component Value Date   VD25OH 23.9 (L) 10/06/2019     Inflammation (CRP: Acute Phase) (ESR: Chronic Phase) No results found for: CRP, ESRSEDRATE, LATICACIDVEN     Note: Above Lab results reviewed.  Recent Imaging Review  MR Brain W Wo Contrast CLINICAL DATA:  Vertigo bilateral tinnitus. Slurred speech and double vision. Migraine headache history.  EXAM: MRI HEAD WITHOUT AND WITH CONTRAST  TECHNIQUE: Multiplanar, multiecho pulse sequences of the brain and surrounding structures were obtained without and with intravenous contrast.  CONTRAST:  33m GADAVIST GADOBUTROL 1 MMOL/ML IV SOLN  COMPARISON:  None.  FINDINGS: Brain: IAC protocol with thin sections through the posterior fossa. Seventh and eighth cranial nerves normal. Negative for vestibular schwannoma. Basilar cisterns normal. Brainstem and cerebellum normal. Mastoid sinus clear bilaterally. Normal enhancement in the temporal bone bilaterally.  Ventricle size normal. Negative for infarct, hemorrhage, mass. Normal white matter.  Vascular: Normal arterial flow voids.  Skull and upper cervical spine: No focal skeletal lesion.  Sinuses/Orbits: Negative  Other: None  IMPRESSION: Negative MRI of the brain with contrast with special  attention to the posterior fossa.  Electronically Signed   By: CFranchot GalloM.D.   On: 04/30/2019 10:08 Note: Reviewed        Physical Exam  General appearance: Well nourished, well developed, and well hydrated. In no apparent acute distress Mental status: Alert, oriented x 3 (person, place, & time)       Respiratory: No evidence of acute respiratory distress Eyes: PERLA Vitals: BP (!) 96/51   Pulse 96   Resp 18   Ht 5' 1"  (1.549 m)   Wt 187 lb (84.8 kg)   LMP 12/25/2019 (Approximate)   SpO2 98%   BMI 35.33 kg/m  BMI: Estimated body mass index is 35.33 kg/m as calculated from the following:   Height as of this encounter: 5' 1"  (1.549 m).   Weight as of this encounter: 187 lb (84.8 kg). Ideal: Ideal body weight: 47.8 kg (105 lb 6.1 oz) Adjusted ideal body weight: 62.6 kg (138 lb 0.5 oz)   Lumbar Spine Area  Exam  Skin & Axial Inspection:No masses, redness, or swelling Alignment:Symmetrical Functional YFV:CBSWHQPRF ROM Stability:No instability detected Muscle Tone/Strength:Functionally intact. No obvious neuro-muscular anomalies detected. Sensory (Neurological):Articular pain pattern Palpation:Complains of area being tender to palpationBilateral Fist Percussion Test Provocative Tests: Lumbar Hyperextension and rotation test:Positivebilaterally for facet joint pain. Lumbar Lateral bending test:Positivedue to pain. Patrick's Maneuver:evaluation deferred today  Gait & Posture Assessment  Ambulation:Unassisted Gait:Relatively normal for age and body habitus Posture:WNL  Lower Extremity Exam    Side:Right lower extremity  Side:Left lower extremity  Stability:No instability observed  Stability:No instability observed  Skin & Extremity Inspection:Skin color, temperature, and hair growth are WNL. No peripheral edema or cyanosis. No masses, redness, swelling, asymmetry, or associated skin lesions. No  contractures.  Skin & Extremity Inspection:Skin color, temperature, and hair growth are WNL. No peripheral edema or cyanosis. No masses, redness, swelling, asymmetry, or associated skin lesions. No contractures.  Functional FMB:[id number] ROM   Functional XBL:TJQZESPQZRAQ ROM   Muscle Tone/Strength:Functionally intact. No obvious neuro-muscular anomalies detected.  Muscle Tone/Strength:Functionally intact. No obvious neuro-muscular anomalies detected.  Sensory (Neurological):Unimpaired  Sensory (Neurological):Unimpaired  Palpation:No palpable anomalies  Palpation:No palpable anomalies     Assessment   Status Diagnosis  Controlled Controlled Controlled 1. Chronic pain syndrome   2. Spondylosis without myelopathy or radiculopathy, lumbar region   3. Long term current use of opiate analgesic   4. Fibromyalgia   5. TMJ pain dysfunction syndrome   6. Myofascial pain   7. Chronic fatigue   8. Controlled substance agreement signed       Plan of Care  Sharon Rodriguez has a current medication list which includes the following long-term medication(s): albuterol, albuterol, levothyroxine, and metformin.  Pharmacotherapy (Medications Ordered): Meds ordered this encounter  Medications  . oxyCODONE-acetaminophen (PERCOCET) 5-325 MG tablet    Sig: Take 1 tablet by mouth every 6 (six) hours as needed for severe pain. Must last 30 days.    Dispense:  120 tablet    Refill:  0    [facility] [place] STOP ACT - Not applicable. Fill one day early if pharmacy is closed on scheduled refill date.  Marland Kitchen oxyCODONE-acetaminophen (PERCOCET) 5-325 MG tablet    Sig: Take 1 tablet by mouth every 6 (six) hours as needed for severe pain. Must last 30 days.    Dispense:  120 tablet    Refill:  0    [place] STOP ACT - Not applicable. Fill one day early if pharmacy is closed on scheduled refill date.  Marland Kitchen oxyCODONE-acetaminophen (PERCOCET) 5-325 MG tablet    Sig: Take 1 tablet by mouth  every 6 (six) hours as needed for severe pain. Must last 30 days.    Dispense:  120 tablet    Refill:  0    [place] STOP ACT - Not applicable. Fill one day early if pharmacy is closed on scheduled refill date.   Follow-up plan:   Return in about 3 months (around 04/16/2020) for Medication Management, in person.   Recent Visits No visits were found meeting these conditions. Showing recent visits within past 90 days and meeting all other requirements Today's Visits Date Type Provider Dept  01/15/20 Office Visit Gillis Santa, MD [facility]  Showing today's visits and meeting all other requirements Future Appointments No visits were found meeting these conditions. Showing future appointments within next 90 days and meeting all other requirements  I discussed the assessment and treatment plan with the patient. The patient was provided an opportunity to ask questions and all  were answered. The patient agreed with the plan and demonstrated an understanding of the instructions.  Patient advised to call back or seek an in-person evaluation if the symptoms or condition worsens.  Duration of encounter: 30 minutes.  Note by: Gillis Santa, MD Date: 01/15/2020; Time: 1:23 PM

## 2020-01-15 NOTE — Progress Notes (Signed)
Nursing Pain Medication Assessment:  Safety precautions to be maintained throughout the outpatient stay will include: orient to surroundings, keep bed in low position, maintain call bell within reach at all times, provide assistance with transfer out of bed and ambulation.  Medication Inspection Compliance: Pill count conducted under aseptic conditions, in front of the patient. Neither the pills nor the bottle was removed from the patient's sight at any time. Once count was completed pills were immediately returned to the patient in their original bottle.  Medication: Oxycodone IR Pill/Patch Count: 15 of 120 pills remain Pill/Patch Appearance: Markings consistent with prescribed medication Bottle Appearance: Standard pharmacy container. Clearly labeled. Filled Date: 10 / 08 / 2021 Last Medication intake:  Today

## 2020-01-23 ENCOUNTER — Telehealth: Payer: Medicare HMO

## 2020-01-23 ENCOUNTER — Telehealth: Payer: Self-pay | Admitting: General Practice

## 2020-01-23 NOTE — Telephone Encounter (Signed)
  Chronic Care Management   Outreach Note  01/23/2020 Name: Sharon Rodriguez MRN: [id number] DOB: 1980-06-30  Referred by: Valerie Roys, DO Reason for referral : Chronic Care Management (RNCM Chronic Disease Management and Care Coordination Needs- 2nd attempt)   A second unsuccessful telephone outreach was attempted today. The patient was referred to the case management team for assistance with care management and care coordination.   Follow Up Plan: A HIPAA compliant phone message was left for the patient providing contact information and requesting a return call.   Noreene Larsson RN, MSN, [facility] Mobile: [phone]

## 2020-01-27 DIAGNOSIS — R69 Illness, unspecified: Secondary | ICD-10-CM | POA: Diagnosis not present

## 2020-01-27 DIAGNOSIS — F900 Attention-deficit hyperactivity disorder, predominantly inattentive type: Secondary | ICD-10-CM | POA: Diagnosis not present

## 2020-01-27 DIAGNOSIS — F411 Generalized anxiety disorder: Secondary | ICD-10-CM | POA: Diagnosis not present

## 2020-02-03 ENCOUNTER — Telehealth: Payer: Self-pay

## 2020-02-03 NOTE — Chronic Care Management (AMB) (Signed)
  Care Management   Note  02/03/2020 Name: Sharon Rodriguez MRN: [id number] DOB: 04-23-1980  Sharon Rodriguez is a 39 y.o. year old female who is a primary care patient of Valerie Roys, DO and is actively engaged with the care management team. I reached out to [name] by phone today to assist with re-scheduling a follow up visit with the RN Case Manager  Follow up plan: Unsuccessful telephone outreach attempt made. A HIPAA compliant phone message was left for the patient providing contact information and requesting a return call.  The care management team will reach out to the patient again over the next 7 days.  If patient returns call to provider office, please advise to call [facility]  at [place], [place], [place], [place] [address] Direct Dial: [phone] [email or web address]

## 2020-02-13 ENCOUNTER — Telehealth: Payer: Medicaid Other

## 2020-02-23 NOTE — Chronic Care Management (AMB) (Addendum)
  Care Management   Note  02/23/2020 Name: Sharon Rodriguez MRN: [id number] DOB: 1980-11-04  Sharon Rodriguez is a 39 y.o. year old female who is a primary care patient of Valerie Roys, DO and is actively engaged with the care management team. I reached out to [name] by phone today to assist with re-scheduling a follow up visit with the RN Case Manager  Follow up plan: Telephone appointment with care management team member scheduled for:04/13/2020  Noreene Larsson, [place], [facility]  [place], [place] [address] Direct Dial: [phone] [email or web address]

## 2020-02-26 ENCOUNTER — Telehealth: Payer: Self-pay | Admitting: Family Medicine

## 2020-02-26 NOTE — Telephone Encounter (Signed)
Patient called to inform the doctor that she cannot take her Metformin any longer for her diabetes.  She stated that the doctor said she would change the medication if the patient wanted to.  Please call patient to confirm and see if she can get another medication as soon as possible.  CB# [phone]

## 2020-02-26 NOTE — Telephone Encounter (Signed)
Please advise 

## 2020-02-26 NOTE — Telephone Encounter (Signed)
Will let Dr. Wynetta Emery review on her return.

## 2020-03-01 ENCOUNTER — Telehealth: Payer: Self-pay | Admitting: General Practice

## 2020-03-01 NOTE — Telephone Encounter (Cosign Needed)
  Chronic Care Management   Note  03/01/2020 Name: Sharon Rodriguez MRN: [id number] DOB: 01-17-1981  The patient has called and left VM x 3 asking the RNCM to change her medications for her DM. Text messaging sent to the patient that the [facility] can not change her medications it needs to come from Dr. Wynetta Emery.  The patient called this am and ask the [facility] to tell Dr. Wynetta Emery that she has stopped taking her Metformin due to side effects of the medication and needs something called in for her DM.  In basket message sent to Dr. Wynetta Emery, the clinical staff at [facility], and the CCM pharmacist for assistance with the patients request.   Follow up plan: The patient has been provided with contact information for the care management team and has been advised to call with any health related questions or concerns. Will review the role of the CCM team at next outreach.   Noreene Larsson RN, MSN, [facility] Mobile: [phone]

## 2020-03-01 NOTE — Telephone Encounter (Signed)
See other telephone encounter about the same topic.

## 2020-03-01 NOTE — Telephone Encounter (Signed)
appt

## 2020-03-01 NOTE — Telephone Encounter (Signed)
Patient calling again, states she has heard nothing from the office. Patient has been taking Metformin for a while and has had multiple problems with this medication - states she addressed this in office with PCP and would like to know when she can stop the Metformin and what new medication will be started.

## 2020-03-01 NOTE — Telephone Encounter (Signed)
Scheduled virtual tomorrow

## 2020-03-02 ENCOUNTER — Telehealth: Payer: Medicare HMO | Admitting: Family Medicine

## 2020-03-02 ENCOUNTER — Telehealth: Payer: Self-pay | Admitting: Family Medicine

## 2020-03-02 NOTE — Telephone Encounter (Signed)
Patient is calling because she missed her appt today due to challenges getting into her MyChart.  Patient states that she need an appt today. Patient was told that there where no available appts today. However, patient was offered on Thursday. Patient declined. And hung up. Patient states that she asked for a telephone call. And no one called her. Please advise CB- [phone]

## 2020-03-03 ENCOUNTER — Telehealth: Payer: Self-pay | Admitting: Family Medicine

## 2020-03-03 ENCOUNTER — Telehealth: Payer: Self-pay | Admitting: General Practice

## 2020-03-03 ENCOUNTER — Ambulatory Visit (INDEPENDENT_AMBULATORY_CARE_PROVIDER_SITE_OTHER): Payer: Medicare HMO | Admitting: Family Medicine

## 2020-03-03 ENCOUNTER — Encounter: Payer: Self-pay | Admitting: Family Medicine

## 2020-03-03 ENCOUNTER — Ambulatory Visit: Payer: Medicaid Other | Admitting: Licensed Clinical Social Worker

## 2020-03-03 DIAGNOSIS — E1139 Type 2 diabetes mellitus with other diabetic ophthalmic complication: Secondary | ICD-10-CM

## 2020-03-03 DIAGNOSIS — M069 Rheumatoid arthritis, unspecified: Secondary | ICD-10-CM

## 2020-03-03 DIAGNOSIS — F339 Major depressive disorder, recurrent, unspecified: Secondary | ICD-10-CM

## 2020-03-03 DIAGNOSIS — F419 Anxiety disorder, unspecified: Secondary | ICD-10-CM

## 2020-03-03 MED ORDER — EMPAGLIFLOZIN 25 MG PO TABS: 25.0000 mg | ORAL_TABLET | Freq: Every day | ORAL | 3 refills | Status: DC

## 2020-03-03 NOTE — Assessment & Plan Note (Signed)
Has had bad reaction to her metformin with severe diarrhea. This resolved with stopping the metformin. Will stop metformin and start jardiance. Due for A1c. Will get her in for her follow up for repeat labs. Call with any concerns.

## 2020-03-03 NOTE — Telephone Encounter (Signed)
Pt spoke with practice admin and was able to be added on for 03/03/2020. Per practice admin this has been completed.

## 2020-03-03 NOTE — Chronic Care Management (AMB) (Addendum)
Chronic Care Management    Clinical Social Work Follow Up Note  03/03/2020 Name: Sharon Rodriguez MRN: [id number] DOB: 10/22/1980  Sharon Rodriguez is a 39 y.o. year old female who is a primary care patient of Valerie Roys, DO. The CCM team was consulted for assistance with Mental Health Counseling and Resources.   Review of patient status, including review of consultants reports, other relevant assessments, and collaboration with appropriate care team members and the patient's provider was performed as part of comprehensive patient evaluation and provision of chronic care management services.    SDOH (Social Determinants of Health) assessments performed: Yes    Outpatient Encounter Medications as of 03/03/2020  Medication Sig   albuterol (PROVENTIL HFA;VENTOLIN HFA) 108 (90 Base) MCG/ACT inhaler Inhale 1 puff into the lungs every 6 (six) hours as needed for wheezing or shortness of breath.    albuterol (PROVENTIL) (2.5 MG/3ML) 0.083% nebulizer solution Frequency:Q4HPRN   Dosage:0.0     Instructions:  Note:Dose: 1  Dx:J42   amphetamine-dextroamphetamine (ADDERALL) 30 MG tablet Take 1 tablet by mouth 2 (two) times daily.   empagliflozin (JARDIANCE) 25 MG TABS tablet Take 1 tablet (25 mg total) by mouth daily before breakfast.   levothyroxine (SYNTHROID) 75 MCG tablet Take 1 tablet (75 mcg total) by mouth daily. Synthroid brand name necessary   Multiple Vitamin (MULTIVITAMIN) tablet Take 1 tablet by mouth daily.   naproxen (NAPROSYN) 500 MG tablet Take 1 tablet (500 mg total) by mouth 2 (two) times daily with a meal.   ondansetron (ZOFRAN ODT) 8 MG disintegrating tablet Take 1 tablet (8 mg total) by mouth every 8 (eight) hours as needed for nausea or vomiting.   oxyCODONE-acetaminophen (PERCOCET) 5-325 MG tablet Take 1 tablet by mouth every 6 (six) hours as needed for severe pain. Must last 30 days.   [START ON 03/18/2020] oxyCODONE-acetaminophen (PERCOCET) 5-325 MG tablet Take 1  tablet by mouth every 6 (six) hours as needed for severe pain. Must last 30 days.   Vitamin D, Ergocalciferol, (DRISDOL) 1.25 MG (50000 UNIT) CAPS capsule Take 1 capsule (50,000 Units total) by mouth once a week.   No facility-administered encounter medications on file as of 03/03/2020.     Goals Addressed            This Visit's Progress    SW-Track and Manage My Symptoms-Depression       Timeframe:  Long-Range Goal Priority:  Medium Start Date:  03/03/20                       Expected End Date:  06/01/20                     Follow Up Date-90 days from 03/03/20   - avoid negative self-talk - develop a personal safety plan - develop a plan to deal with triggers like holidays, anniversaries - exercise at least 2 to 3 times per week - have a plan for how to handle bad days - journal feelings and what helps to feel better or worse - spend time or talk with others at least 2 to 3 times per week - spend time or talk with others every day - watch for early signs of feeling worse - write in journal every day    Why is this important?    Keeping track of your progress will help your treatment team find the right mix of medicine and therapy for you.   Write  in your journal every day.   Day-to-day changes in depression symptoms are normal. It may be more helpful to check your progress at the end of each week instead of every day.      Depression screen [facility] 2/9 01/15/2020 01/01/2020 10/15/2019  Decreased Interest 0 3 0  Down, Depressed, Hopeless 0 3 0  PHQ - 2 Score 0 6 0  Altered sleeping - 3 -  Tired, decreased energy - 3 -  Change in appetite - 3 -  Feeling bad or failure about yourself  - 3 -  Trouble concentrating - 3 -  Moving slowly or fidgety/restless - 3 -  Suicidal thoughts - 0 -  PHQ-9 Score - 24 -  Difficult doing work/chores - Very difficult -  Some recent data might be hidden    GAD 7 : Generalized Anxiety Score 12/15/2016  Nervous, Anxious, on Edge 2   Control/stop worrying 2  Worry too much - different things 3  Trouble relaxing 2  Restless 0  Easily annoyed or irritable 2  Afraid - awful might happen 2  Total GAD 7 Score 13  Anxiety Difficulty Extremely difficult   Assessment, progress and current barriers:  Patient is currently experiencing symptoms of anxiety and depression which seems to be exacerbated by her own medical conditions. Lacks knowledge of where and how to connect mental health support. Support, Education, and Care Coordination will be provided in order to meet unmet need.   Clinical Goal(s): Over the next 120 days, patient will work with SW to reduce or manage symptoms of agitation, anxiety, depression, and mood instability until connected for ongoing counseling.  Over the next 120 days, patient will work with CCM LCSW to address needs related to find mental health support Clinical Interventions:   Assessed thoughts of SI, plan and access to means. Assessed patient's previous treatment, needs, coping skills, current treatment, support system and barriers to care  Patient interviewed and appropriate assessments performed: brief mental health assessment  Provided basic mental health support, education and interventions to patient on 03/03/20. Patient had sent multiple text messages to the CCM RNCM asking for Dr. Wynetta Emery to call her as she could not figure out how to do the my chart visit. Patient is in need of changing her DM medications. Patient was very upset that she missed this appointment and was offered emotional support and redirection. CCM LCSW sent message to PCP and telephonic office visit was scheduled for today. Patient attended this appointment successfully. Patient reports that she wishes to address her mental health concerns after she gets her physical needs met.   Collaborated with CCM team and PCP regarding patient needs  Discussed several options for long term counseling based on need and insurance.  Assisted patient with narrowing the options down to [facility], [facility] or [facility].   Patient interviewed and appropriate assessments performed  Provided mental health counseling with regard to anxiety and stress management  Assisted patient/caregiver with obtaining information about health plan benefits  Provided education and assistance to client regarding Advanced Directives.  Encouraged patient to consider a mental health provider for long term follow up and therapy/counseling  Other interventions include: Solution-Focused Strategies   Collaboration with PCP regarding development and update of comprehensive plan of care as evidenced by provider attestation and co-signature  Inter-disciplinary care team collaboration (see longitudinal plan of care)  1:1 collaboration with PCP regarding development and update of comprehensive plan of care as evidenced by provider attestation and co-signature  Follow Up Plan:  SW will follow up with patient by phone over the next quarter      Follow Up Plan: SW will follow up with patient by phone over the next quarter  Eula Fried, [place], MSW, [email or web address] Phone: [phone]

## 2020-03-03 NOTE — Progress Notes (Signed)
There were no vitals taken for this visit.   Subjective:    Patient ID: Sharon Rodriguez, female    DOB: May 18, 1980, 39 y.o.   MRN: [id number]  HPI: Sharon Rodriguez is a 39 y.o. female  Chief Complaint  Patient presents with  . Diabetes   DIABETES- has been off of her metformin for about a week, was having bad diarrhea when she was on the medicine, this now resolved entirely. Hypoglycemic episodes:no Polydipsia/polyuria: yes Visual disturbance: yes Chest pain: no Paresthesias: no Glucose Monitoring: no  Accucheck frequency: Not Checking Taking Insulin?: no Blood Pressure Monitoring: not checking Retinal Examination: Not up to Date Foot Exam: Up to Date Diabetic Education: Completed Pneumovax: Up to Date Influenza: Up to Date Aspirin: no  Relevant past medical, surgical, family and social history reviewed and updated as indicated. Interim medical history since our last visit reviewed. Allergies and medications reviewed and updated.  Review of Systems  Constitutional: Negative.   Respiratory: Negative.   Cardiovascular: Negative.   Gastrointestinal: Negative.   Musculoskeletal: Negative.   Psychiatric/Behavioral: Negative.     Per HPI unless specifically indicated above     Objective:    There were no vitals taken for this visit.  Wt Readings from Last 3 Encounters:  01/15/20 187 lb (84.8 kg)  01/01/20 182 lb 3.2 oz (82.6 kg)  10/15/19 186 lb (84.4 kg)    Physical Exam Vitals and nursing note reviewed.  Pulmonary:     Effort: Pulmonary effort is normal. No respiratory distress.     Comments: Speaking in full sentences Neurological:     Mental Status: She is alert.  Psychiatric:        Mood and Affect: Mood normal.        Behavior: Behavior normal.        Thought Content: Thought content normal.        Judgment: Judgment normal.     Results for orders placed or performed in visit on 10/15/19  ToxASSURE Select 63 (MW), Urine  Result Value Ref Range    Summary Note       Assessment & Plan:   Problem List Items Addressed This Visit      Endocrine   Diabetes mellitus ([place]) - Primary    Has had bad reaction to her metformin with severe diarrhea. This resolved with stopping the metformin. Will stop metformin and start jardiance. Due for A1c. Will get her in for her follow up for repeat labs. Call with any concerns.       Relevant Medications   empagliflozin (JARDIANCE) 25 MG TABS tablet       Follow up plan: Return As scheduled for January.   . This visit was completed via telephone due to the restrictions of the COVID-19 pandemic. All issues as above were discussed and addressed but no physical exam was performed. If it was felt that the patient should be evaluated in the office, they were directed there. The patient verbally consented to this visit. Patient was unable to complete an audio/visual visit due to Lack of equipment. Due to the catastrophic nature of the COVID-19 pandemic, this visit was done through audio contact only. . Location of the patient: home . Location of the provider: work . Those involved with this call:  . Provider: Park Liter, DO . CMA: Louanna Raw, [place] . Front Desk/Registration: Jill Side  . Time spent on call: 21 minutes on the phone discussing health concerns. 23 minutes total spent in review  of patient's record and preparation of their chart.

## 2020-03-03 NOTE — Telephone Encounter (Cosign Needed)
  Chronic Care Management   Outreach Note  03/03/2020 Name: Sharon Rodriguez MRN: [id number] DOB: 04-17-1980  Referred by: Valerie Roys, DO Reason for referral : Advice Only (RNCM: Patient has sent multiple text messages to the [facility] asking for help with getting an appointment with Dr. Wynetta Emery as she missed a My chart appointment on 03-02-2020)   Sharon Rodriguez is enrolled in a Managed [place]: No  The patient has sent multiple text messages this am to the [facility] that she needs help with getting an appointment to talk with Dr. Wynetta Emery as she could not do the appointment through [facility] on 03-02-2020.  She ask for Dr. Wynetta Emery to call her number.  Advised the patient to call the office this am and talk with staff. Will route in basket message to Dr. Wynetta Emery and the administrative staff for help for the patient.     Noreene Larsson RN, MSN, [facility] Mobile: [phone]

## 2020-03-04 NOTE — Telephone Encounter (Signed)
Patient had telephone visit on 03/03/20.

## 2020-03-11 ENCOUNTER — Telehealth: Payer: Self-pay | Admitting: *Deleted

## 2020-03-11 NOTE — Chronic Care Management (AMB) (Signed)
  Care Management   Note  03/11/2020 Name: STEFFANI DIONISIO MRN: [id number] DOB: 09-18-1980  MAKALYN LENNOX is a 39 y.o. year old female who is a primary care patient of Dorcas Carrow, DO and is actively engaged with the care management team. I reached out to [name] by phone today to assist with scheduling an initial visit with the Pharmacist  Follow up plan: Patient declines engagement with the Pharmacist with the care management team. Appropriate care team members and provider have been notified via electronic communication. The Pharmacist with the care management team is available to follow up with the patient after provider conversation with the patient regarding recommendation for care management engagement and subsequent re-referral to the care management team.   [facility] Guide, Embedded Care Coordination [facility]  Care Management  Direct Dial: [phone]

## 2020-03-26 ENCOUNTER — Ambulatory Visit (INDEPENDENT_AMBULATORY_CARE_PROVIDER_SITE_OTHER): Payer: Medicare HMO | Admitting: Family Medicine

## 2020-03-26 ENCOUNTER — Encounter: Payer: Self-pay | Admitting: Family Medicine

## 2020-03-26 DIAGNOSIS — Z1159 Encounter for screening for other viral diseases: Secondary | ICD-10-CM

## 2020-03-26 DIAGNOSIS — E1139 Type 2 diabetes mellitus with other diabetic ophthalmic complication: Secondary | ICD-10-CM

## 2020-03-26 DIAGNOSIS — E039 Hypothyroidism, unspecified: Secondary | ICD-10-CM | POA: Diagnosis not present

## 2020-03-26 DIAGNOSIS — E559 Vitamin D deficiency, unspecified: Secondary | ICD-10-CM

## 2020-03-26 NOTE — Progress Notes (Signed)
There were no vitals taken for this visit.   Subjective:    Patient ID: Sharon Rodriguez, female    DOB: 1980/12/16, 40 y.o.   MRN: [id number]  HPI: Sharon Rodriguez is a 40 y.o. female  Chief Complaint  Patient presents with  . Diabetes  . Hyperlipidemia  . Hypothyroidism   DIABETES- diarrhea has gone away Hypoglycemic episodes:no Polydipsia/polyuria: yes Visual disturbance: yes Chest pain: no Paresthesias: no Glucose Monitoring: no  Accucheck frequency: Not Checking Taking Insulin?: no Blood Pressure Monitoring: not checking Retinal Examination: Up to Date Foot Exam: Up to Date Diabetic Education: Completed Pneumovax: Up to Date Influenza: Up to Date Aspirin: no   HYPERLIPIDEMIA Hyperlipidemia status: stable Satisfied with current treatment?  yes Side effects:  Not on anything Past cholesterol meds: none Supplements: none Aspirin:  no The 10-year ASCVD risk score Mikey Bussing DC Jr., et al., 2013) is: 0.9%   Values used to calculate the score:     Age: 6 years     Sex: Female     Is Non-Hispanic African American: No     Diabetic: Yes     Tobacco smoker: No     Systolic Blood Pressure: 96 mmHg     Is BP treated: No     HDL Cholesterol: 37 mg/dL     Total Cholesterol: 155 mg/dL Chest pain:  no Coronary artery disease:  no  HYPOTHYROIDISM Thyroid control status:controlled Satisfied with current treatment? yes Medication side effects: no Medication compliance: good compliance Recent dose adjustment:no Fatigue: yes Cold intolerance: no Heat intolerance: yes Weight gain: no Weight loss: no Constipation: no Diarrhea/loose stools: no Palpitations: no Lower extremity edema: no Anxiety/depressed mood: yes  Relevant past medical, surgical, family and social history reviewed and updated as indicated. Interim medical history since our last visit reviewed. Allergies and medications reviewed and updated.  Review of Systems  Constitutional: Negative.   HENT:  Negative.   Respiratory: Negative.   Cardiovascular: Negative.   Gastrointestinal: Negative.   Musculoskeletal: Negative.   Psychiatric/Behavioral: Negative.     Per HPI unless specifically indicated above     Objective:    There were no vitals taken for this visit.  Wt Readings from Last 3 Encounters:  01/15/20 187 lb (84.8 kg)  01/01/20 182 lb 3.2 oz (82.6 kg)  10/15/19 186 lb (84.4 kg)    Physical Exam Vitals and nursing note reviewed.  Pulmonary:     Effort: Pulmonary effort is normal. No respiratory distress.     Comments: Speaking in full sentences Neurological:     Mental Status: She is alert.  Psychiatric:        Mood and Affect: Mood normal.        Behavior: Behavior normal.        Thought Content: Thought content normal.        Judgment: Judgment normal.     Results for orders placed or performed in visit on 10/15/19  ToxASSURE Select 13 (MW), Urine  Result Value Ref Range   Summary Note       Assessment & Plan:   Problem List Items Addressed This Visit      Endocrine   Hypothyroidism    Will get blood work next week. Await results. Treat as needed. Call with any concerns.       Relevant Orders   CBC with Differential/Platelet   Comprehensive metabolic panel   TSH   Diabetes mellitus ([place]) - Primary    Under good control on  current regimen. Continue current regimen. Continue to monitor. Call with any concerns. Refills given. Will check labs next week. Await results.        Relevant Orders   Bayer DCA Hb A1c Waived   CBC with Differential/Platelet   Comprehensive metabolic panel   Lipid Panel w/o Chol/HDL Ratio   Microalbumin, Urine Waived     Other   Vitamin D deficiency    Will check labs next week. Await results. Treat as needed.       Relevant Orders   VITAMIN D 25 Hydroxy (Vit-D Deficiency, Fractures)    Other Visit Diagnoses    Need for hepatitis C screening test       Will check labs next visit.    Relevant Orders    Hepatitis C Antibody       Follow up plan: Return in about 3 months (around 06/24/2020).   . This visit was completed via MyChart due to the restrictions of the COVID-19 pandemic. All issues as above were discussed and addressed. Physical exam was done as above through visual confirmation on MyChart. If it was felt that the patient should be evaluated in the office, they were directed there. The patient verbally consented to this visit. . Location of the patient: home . Location of the provider: home . Those involved with this call:  . Provider: Park Liter, DO . CMA: Yvonna Alanis, [place] . Front Desk/Registration: Jill Side  . Time spent on call: 25 minutes on the phone discussing health concerns. 40 minutes total spent in review of patient's record and preparation of their chart.

## 2020-03-27 ENCOUNTER — Encounter: Payer: Self-pay | Admitting: Family Medicine

## 2020-03-27 NOTE — Assessment & Plan Note (Signed)
Will get blood work next week. Await results. Treat as needed. Call with any concerns.

## 2020-03-27 NOTE — Assessment & Plan Note (Signed)
Under good control on current regimen. Continue current regimen. Continue to monitor. Call with any concerns. Refills given. Will check labs next week. Await results.

## 2020-03-27 NOTE — Assessment & Plan Note (Signed)
Will check labs next week. Await results. Treat as needed.

## 2020-04-13 ENCOUNTER — Telehealth: Payer: Self-pay | Admitting: General Practice

## 2020-04-13 ENCOUNTER — Telehealth: Payer: Medicare HMO

## 2020-04-13 NOTE — Telephone Encounter (Cosign Needed)
°  Chronic Care Management   Outreach Note  04/13/2020 Name: Sharon Rodriguez MRN: [id number] DOB: 1980-06-06  Referred by: Valerie Roys, DO Reason for referral : Appointment (RNCM: Follow up for Chronic Disease Management and Care Coordination Needs- 2nd attempt)   A second unsuccessful telephone outreach was attempted today. The patient was referred to the case management team for assistance with care management and care coordination.   Follow Up Plan: A HIPAA compliant phone message was left for the patient providing contact information and requesting a return call.   Noreene Larsson RN, MSN, [facility] Mobile: [phone]

## 2020-04-15 ENCOUNTER — Encounter: Payer: Self-pay | Admitting: Student in an Organized Health Care Education/Training Program

## 2020-04-15 ENCOUNTER — Other Ambulatory Visit: Payer: Self-pay

## 2020-04-15 ENCOUNTER — Ambulatory Visit
Payer: Medicare HMO | Attending: Student in an Organized Health Care Education/Training Program | Admitting: Student in an Organized Health Care Education/Training Program

## 2020-04-15 VITALS — BP 116/76 | HR 100 | Temp 97.2°F | Resp 18 | Ht 61.0 in | Wt 168.0 lb

## 2020-04-15 DIAGNOSIS — R5382 Chronic fatigue, unspecified: Secondary | ICD-10-CM | POA: Diagnosis not present

## 2020-04-15 DIAGNOSIS — Z79891 Long term (current) use of opiate analgesic: Secondary | ICD-10-CM | POA: Diagnosis not present

## 2020-04-15 DIAGNOSIS — M47816 Spondylosis without myelopathy or radiculopathy, lumbar region: Secondary | ICD-10-CM | POA: Insufficient documentation

## 2020-04-15 DIAGNOSIS — M7918 Myalgia, other site: Secondary | ICD-10-CM | POA: Diagnosis not present

## 2020-04-15 DIAGNOSIS — G894 Chronic pain syndrome: Secondary | ICD-10-CM | POA: Diagnosis not present

## 2020-04-15 DIAGNOSIS — M797 Fibromyalgia: Secondary | ICD-10-CM | POA: Diagnosis not present

## 2020-04-15 DIAGNOSIS — M26629 Arthralgia of temporomandibular joint, unspecified side: Secondary | ICD-10-CM | POA: Diagnosis not present

## 2020-04-15 MED ORDER — OXYCODONE-ACETAMINOPHEN 5-325 MG PO TABS: 1.0000 | ORAL_TABLET | Freq: Four times a day (QID) | ORAL | 0 refills | Status: DC | PRN

## 2020-04-15 MED ORDER — OXYCODONE-ACETAMINOPHEN 5-325 MG PO TABS: 1.0000 | ORAL_TABLET | Freq: Four times a day (QID) | ORAL | 0 refills | Status: AC | PRN

## 2020-04-15 NOTE — Progress Notes (Signed)
Nursing Pain Medication Assessment:  Safety precautions to be maintained throughout the outpatient stay will include: orient to surroundings, keep bed in low position, maintain call bell within reach at all times, provide assistance with transfer out of bed and ambulation.  Medication Inspection Compliance: Pill count conducted under aseptic conditions, in front of the patient. Neither the pills nor the bottle was removed from the patient's sight at any time. Once count was completed pills were immediately returned to the patient in their original bottle.  Medication: Oxycodone/APAP Pill/Patch Count: 6 of 120 pills remain Pill/Patch Appearance: Markings consistent with prescribed medication Bottle Appearance: Standard pharmacy container. Clearly labeled. Filled Date: 1 / 6 / 22 Last Medication intake:  Today

## 2020-04-15 NOTE — Patient Instructions (Signed)
Oxycodone/APAP to last until 07/15/20 has been escribed to your pharmacy.

## 2020-04-15 NOTE — Progress Notes (Signed)
PROVIDER NOTE: Information contained herein reflects review and annotations entered in association with encounter. Interpretation of such information and data should be left to medically-trained personnel. Information provided to patient can be located elsewhere in the medical record under "Patient Instructions". Document created using STT-dictation technology, any transcriptional errors that may result from process are unintentional.    Patient: Sharon Rodriguez  Service Category: E/M  Provider: Gillis Santa, MD  DOB: Jun 07, 1980  DOS: 04/15/2020  Specialty: Interventional Pain Management  MRN: [id number]  Setting: Ambulatory outpatient  PCP: Sharon Roys, DO  Type: Established Patient    Referring Provider: Valerie Roys, DO  Location: Office  Delivery: Face-to-face     HPI  [name], a 40 y.o. year old female, is here today because of her Chronic pain syndrome [G89.4]. [name] primary complain today is Pain (generalized) Last encounter: My last encounter with her was on 01/15/2020. Pertinent problems: [name] has Hypothyroidism; Rheumatoid arthritis ([place]); Depression; Fibromyalgia; TMJ pain dysfunction syndrome; H/O juvenile rheumatoid arthritis; Spondylosis without myelopathy or radiculopathy, lumbar region; Chronic bilateral low back pain without sciatica; Chronic pain syndrome; and Long term current use of opiate analgesic on their pertinent problem list. Pain Assessment: Severity of Chronic pain is reported as a 8 /10. Location: Generalized  / . Onset: More than a month ago. Quality: Burning,Throbbing,Aching,Pins and needles. Timing: Constant. Modifying factor(s): meds. Vitals:  height is 5' 1"  (1.549 m) and weight is 168 lb (76.2 kg). Her temporal temperature is 97.2 F (36.2 C) (abnormal). Her blood pressure is 116/76 and her pulse is 100. Her respiration is 18 and oxygen saturation is 99%.   Reason for encounter: medication management.    No change in medical history  since last visit.  Patient's pain is at baseline.  Patient continues multimodal pain regimen as prescribed.  States that it provides pain relief and improvement in functional status.   Pharmacotherapy Assessment   Analgesic: Percocet 5 mg 4 times daily as needed, quantity 120/month; MME equals 30    Monitoring: [place] PMP: PDMP reviewed during this encounter.       Pharmacotherapy: No side-effects or adverse reactions reported. Compliance: No problems identified. Effectiveness: Clinically acceptable.  [name], RN  04/15/2020  1:37 PM  Sign when Signing Visit Nursing Pain Medication Assessment:  Safety precautions to be maintained throughout the outpatient stay will include: orient to surroundings, keep bed in low position, maintain call bell within reach at all times, provide assistance with transfer out of bed and ambulation.  Medication Inspection Compliance: Pill count conducted under aseptic conditions, in front of the patient. Neither the pills nor the bottle was removed from the patient's sight at any time. Once count was completed pills were immediately returned to the patient in their original bottle.  Medication: Oxycodone/APAP Pill/Patch Count: 6 of 120 pills remain Pill/Patch Appearance: Markings consistent with prescribed medication Bottle Appearance: Standard pharmacy container. Clearly labeled. Filled Date: 1 / 6 / 22 Last Medication intake:  Today    UDS:  Summary  Date Value Ref Range Status  10/15/2019 Note  Final    Comment:    ==================================================================== [facility] (MW) ==================================================================== Test                             Result       Flag       Units  Drug Present and Declared for Prescription Verification   Oxycodone  871          EXPECTED   ng/mg creat   Oxymorphone                    902          EXPECTED   ng/mg creat   Noroxycodone                    1166         EXPECTED   ng/mg creat   Noroxymorphone                 193          EXPECTED   ng/mg creat    Sources of oxycodone are scheduled prescription medications.    Oxymorphone, noroxycodone, and noroxymorphone are expected    metabolites of oxycodone. Oxymorphone is also available as a    scheduled prescription medication.  Drug Absent but Declared for Prescription Verification   Amphetamine                    Not Detected UNEXPECTED ng/mg creat ==================================================================== Test                      Result    Flag   Units      Ref Range   Creatinine              192              mg/dL      >=20 ==================================================================== Declared Medications:  The flagging and interpretation on this report are based on the  following declared medications.  Unexpected results may arise from  inaccuracies in the declared medications.   **Note: The testing scope of this panel includes these medications:   Amphetamine (Adderall)  Oxycodone (Percocet)   **Note: The testing scope of this panel does not include the  following reported medications:   Acetaminophen (Percocet)  Albuterol (Ventolin HFA)  Levothyroxine (Synthroid)  Metformin (Glucophage)  Naproxen (Naprosyn)  Ondansetron (Zofran)  Vitamin D2 (Drisdol) ==================================================================== For clinical consultation, please call [phone]. ====================================================================      ROS  Constitutional: Denies any fever or chills Gastrointestinal: No reported hemesis, hematochezia, vomiting, or acute GI distress Musculoskeletal: Denies any acute onset joint swelling, redness, loss of ROM, or weakness Neurological: No reported episodes of acute onset apraxia, aphasia, dysarthria, agnosia, amnesia, paralysis, loss of coordination, or loss of consciousness  Medication  Review  Vitamin D (Ergocalciferol), albuterol, amphetamine-dextroamphetamine, empagliflozin, levothyroxine, multivitamin, naproxen, ondansetron, and oxyCODONE-acetaminophen  History Review  Allergy: [name] is allergic to ibuprofen, metformin and related, and promethazine. Drug: Ms. Zavada  reports no history of drug use. Alcohol:  reports no history of alcohol use. Tobacco:  reports that she has never smoked. She has never used smokeless tobacco. Social: Ms. Mirkin  reports that she has never smoked. She has never used smokeless tobacco. She reports that she does not drink alcohol and does not use drugs. Medical:  has a past medical history of ADD (attention deficit disorder), Anxiety, Asthma, Biliary dyskinesia (12/27/2017), Chronic bronchitis ([place]), Chronic fatigue, Chronic pain, Depression, Diabetes mellitus without complication ([place]), Fibromyalgia, GERD (gastroesophageal reflux disease), History of stomach ulcers (2019), Hypothyroidism (01/27/2015), Learning disability, Osteoarthritis, Rheumatoid arthritis ([place]), Thyroid disease, and TMJ (dislocation of temporomandibular joint). Surgical: Ms. Rome  has a past surgical history that includes Cesarean section; Esophagogastroduodenoscopy (egd) with propofol (N/A, 12/07/2017); and Cholecystectomy (N/A, 01/14/2018). Family: family  history includes ADD / ADHD in her daughter and mother; Allergies in her daughter and daughter; Arthritis in her brother and paternal grandmother; COPD in her mother; Cancer in her maternal grandmother; Dementia in her paternal grandmother; Diabetes in her father and mother; Heart attack in her maternal grandfather; Heart disease in her maternal grandfather; Hyperlipidemia in her father; Learning disabilities in her mother; Mental illness in her daughter, father, and sister; Stroke in her maternal grandfather; Thyroid disease in her daughter.  Laboratory Chemistry Profile   Renal Lab Results  Component Value Date   BUN 11  10/06/2019   CREATININE 0.70 10/06/2019   BCR 16 10/06/2019   GFRAA 124 10/06/2019   GFRNONAA 108 10/06/2019     Hepatic Lab Results  Component Value Date   AST 114 (H) 10/06/2019   ALT 57 (H) 10/06/2019   ALBUMIN 4.4 10/06/2019   ALKPHOS 75 10/06/2019   LIPASE 43 04/10/2017     Electrolytes Lab Results  Component Value Date   NA 139 10/06/2019   K 4.3 10/06/2019   CL 101 10/06/2019   CALCIUM 9.4 10/06/2019     Bone Lab Results  Component Value Date   VD25OH 23.9 (L) 10/06/2019     Inflammation (CRP: Acute Phase) (ESR: Chronic Phase) No results found for: CRP, ESRSEDRATE, LATICACIDVEN     Note: Above Lab results reviewed.  Recent Imaging Review  MR Brain W Wo Contrast CLINICAL DATA:  Vertigo bilateral tinnitus. Slurred speech and double vision. Migraine headache history.  EXAM: MRI HEAD WITHOUT AND WITH CONTRAST  TECHNIQUE: Multiplanar, multiecho pulse sequences of the brain and surrounding structures were obtained without and with intravenous contrast.  CONTRAST:  75m GADAVIST GADOBUTROL 1 MMOL/ML IV SOLN  COMPARISON:  None.  FINDINGS: Brain: IAC protocol with thin sections through the posterior fossa. Seventh and eighth cranial nerves normal. Negative for vestibular schwannoma. Basilar cisterns normal. Brainstem and cerebellum normal. Mastoid sinus clear bilaterally. Normal enhancement in the temporal bone bilaterally.  Ventricle size normal. Negative for infarct, hemorrhage, mass. Normal white matter.  Vascular: Normal arterial flow voids.  Skull and upper cervical spine: No focal skeletal lesion.  Sinuses/Orbits: Negative  Other: None  IMPRESSION: Negative MRI of the brain with contrast with special attention to the posterior fossa.  Electronically Signed   By: CFranchot GalloM.D.   On: 04/30/2019 10:08 Note: Reviewed        Physical Exam  General appearance: Well nourished, well developed, and well hydrated. In no apparent acute  distress Mental status: Alert, oriented x 3 (person, place, & time)       Respiratory: No evidence of acute respiratory distress Eyes: PERLA Vitals: BP 116/76   Pulse 100   Temp (!) 97.2 F (36.2 C) (Temporal)   Resp 18   Ht 5' 1"  (1.549 m)   Wt 168 lb (76.2 kg)   LMP 04/01/2020 (Approximate)   SpO2 99%   BMI 31.74 kg/m  BMI: Estimated body mass index is 31.74 kg/m as calculated from the following:   Height as of this encounter: 5' 1"  (1.549 m).   Weight as of this encounter: 168 lb (76.2 kg). Ideal: Ideal body weight: 47.8 kg (105 lb 6.1 oz) Adjusted ideal body weight: 59.2 kg (130 lb 6.8 oz)  Lumbar Spine Area Exam  Skin & Axial Inspection:No masses, redness, or swelling Alignment:Symmetrical Functional RJME:QASTMHDQQROM Stability:No instability detected Muscle Tone/Strength:Functionally intact. No obvious neuro-muscular anomalies detected. Sensory (Neurological):Articular pain pattern Palpation:Complains of area being tender to palpationBilateral  Fist Percussion Test Provocative Tests: Lumbar Hyperextension and rotation test:Positivebilaterally for facet joint pain.   Gait & Posture Assessment  Ambulation:Unassisted Gait:Relatively normal for age and body habitus Posture:WNL  Lower Extremity Exam    Side:Right lower extremity  Side:Left lower extremity  Stability:No instability observed  Stability:No instability observed  Skin & Extremity Inspection:Skin color, temperature, and hair growth are WNL. No peripheral edema or cyanosis. No masses, redness, swelling, asymmetry, or associated skin lesions. No contractures.  Skin & Extremity Inspection:Skin color, temperature, and hair growth are WNL. No peripheral edema or cyanosis. No masses, redness, swelling, asymmetry, or associated skin lesions. No contractures.  Functional SJG:GEZMOQHUTMLY ROM   Functional YTK:[id number]  ROM   Muscle Tone/Strength:Functionally intact. No obvious neuro-muscular anomalies detected.  Muscle Tone/Strength:Functionally intact. No obvious neuro-muscular anomalies detected.  Sensory (Neurological):Unimpaired  Sensory (Neurological):Unimpaired  Palpation:No palpable anomalies  Palpation:No palpable anomalies       Assessment   Status Diagnosis  Controlled Controlled Controlled 1. Chronic pain syndrome   2. Spondylosis without myelopathy or radiculopathy, lumbar region   3. Long term current use of opiate analgesic   4. Fibromyalgia   5. TMJ pain dysfunction syndrome   6. Myofascial pain   7. Chronic fatigue      Plan of Care   Ms. SYMANTHA STEEBER has a current medication list which includes the following long-term medication(s): albuterol, albuterol, and levothyroxine.  Pharmacotherapy (Medications Ordered): Meds ordered this encounter  Medications  . oxyCODONE-acetaminophen (PERCOCET) 5-325 MG tablet    Sig: Take 1 tablet by mouth every 6 (six) hours as needed for severe pain. Must last 30 days.    Dispense:  120 tablet    Refill:  0    [place] STOP ACT - Not applicable. Fill one day early if pharmacy is closed on scheduled refill date.  Marland Kitchen oxyCODONE-acetaminophen (PERCOCET) 5-325 MG tablet    Sig: Take 1 tablet by mouth every 6 (six) hours as needed for severe pain. Must last 30 days.    Dispense:  120 tablet    Refill:  0    [place] STOP ACT - Not applicable. Fill one day early if pharmacy is closed on scheduled refill date.  Marland Kitchen oxyCODONE-acetaminophen (PERCOCET) 5-325 MG tablet    Sig: Take 1 tablet by mouth every 6 (six) hours as needed for severe pain. Must last 30 days.    Dispense:  120 tablet    Refill:  0    [place] STOP ACT - Not applicable. Fill one day early if pharmacy is closed on scheduled refill date.    Follow-up plan:   Return in about 3 months (around 07/13/2020) for Medication Management, in person.   Recent Visits No visits  were found meeting these conditions. Showing recent visits within past 90 days and meeting all other requirements Today's Visits Date Type Provider Dept  04/15/20 Office Visit Gillis Santa, MD [facility]  Showing today's visits and meeting all other requirements Future Appointments Date Type Provider Dept  07/06/20 Appointment Gillis Santa, MD [facility]  Showing future appointments within next 90 days and meeting all other requirements  I discussed the assessment and treatment plan with the patient. The patient was provided an opportunity to ask questions and all were answered. The patient agreed with the plan and demonstrated an understanding of the instructions.  Patient advised to call back or seek an in-person evaluation if the symptoms or condition worsens.  Duration of encounter: 30 minutes.  Note by: Gillis Santa,  MD Date: 04/15/2020; Time: 2:05 PM

## 2020-04-16 ENCOUNTER — Other Ambulatory Visit: Payer: Medicare HMO

## 2020-04-16 ENCOUNTER — Telehealth: Payer: Medicare HMO | Admitting: Family Medicine

## 2020-04-16 ENCOUNTER — Telehealth: Payer: Self-pay | Admitting: Pain Medicine

## 2020-04-16 ENCOUNTER — Other Ambulatory Visit: Payer: Self-pay

## 2020-04-16 DIAGNOSIS — E559 Vitamin D deficiency, unspecified: Secondary | ICD-10-CM | POA: Diagnosis not present

## 2020-04-16 DIAGNOSIS — Z1159 Encounter for screening for other viral diseases: Secondary | ICD-10-CM | POA: Diagnosis not present

## 2020-04-16 DIAGNOSIS — E1139 Type 2 diabetes mellitus with other diabetic ophthalmic complication: Secondary | ICD-10-CM | POA: Diagnosis not present

## 2020-04-16 DIAGNOSIS — E039 Hypothyroidism, unspecified: Secondary | ICD-10-CM

## 2020-04-16 LAB — MICROALBUMIN, URINE WAIVED
Creatinine, Urine Waived: 200 mg/dL (ref 10–300)
Microalb, Ur Waived: 30 mg/L — ABNORMAL HIGH (ref 0–19)
Microalb/Creat Ratio: <30 mg/g (ref <30)

## 2020-04-16 LAB — BAYER DCA HB A1C WAIVED: HB A1C (BAYER DCA - WAIVED): 6.5 % (ref <7.0)

## 2020-04-16 NOTE — Telephone Encounter (Signed)
Only one was prescribed, but 3 months of Oxycodone. Attempted to call patient, message left.

## 2020-04-16 NOTE — Telephone Encounter (Signed)
Patient states the scripts for March and April are not at the pharmacy. I called, the person who answered the phone said they are there.

## 2020-04-17 LAB — LIPID PANEL W/O CHOL/HDL RATIO
Cholesterol, Total: 184 mg/dL (ref 100–199)
HDL: 42 mg/dL (ref >39)
LDL Chol Calc (NIH): 114 mg/dL — ABNORMAL HIGH (ref 0–99)
Triglycerides: 157 mg/dL — ABNORMAL HIGH (ref 0–149)
VLDL Cholesterol Cal: 28 mg/dL (ref 5–40)

## 2020-04-17 LAB — COMPREHENSIVE METABOLIC PANEL
ALT: 35 IU/L — ABNORMAL HIGH (ref 0–32)
AST: 52 IU/L — ABNORMAL HIGH (ref 0–40)
Albumin/Globulin Ratio: 1.4 (ref 1.2–2.2)
Albumin: 4.4 g/dL (ref 3.8–4.8)
Alkaline Phosphatase: 70 IU/L (ref 44–121)
BUN/Creatinine Ratio: 13 (ref 9–23)
BUN: 11 mg/dL (ref 6–24)
Bilirubin Total: 0.3 mg/dL (ref 0.0–1.2)
CO2: 20 mmol/L (ref 20–29)
Calcium: 9.1 mg/dL (ref 8.7–10.2)
Chloride: 102 mmol/L (ref 96–106)
Creatinine, Ser: 0.88 mg/dL (ref 0.57–1.00)
GFR calc Af Amer: 94 mL/min/{1.73_m2} (ref >59)
GFR calc non Af Amer: 82 mL/min/{1.73_m2} (ref >59)
Globulin, Total: 3.1 g/dL (ref 1.5–4.5)
Glucose: 114 mg/dL — ABNORMAL HIGH (ref 65–99)
Potassium: 4.5 mmol/L (ref 3.5–5.2)
Sodium: 139 mmol/L (ref 134–144)
Total Protein: 7.5 g/dL (ref 6.0–8.5)

## 2020-04-17 LAB — CBC WITH DIFFERENTIAL/PLATELET
Basophils Absolute: 0.1 10*3/uL (ref 0.0–0.2)
Basos: 1 %
EOS (ABSOLUTE): 0.1 10*3/uL (ref 0.0–0.4)
Eos: 2 %
Hematocrit: 42 % (ref 34.0–46.6)
Hemoglobin: 13.6 g/dL (ref 11.1–15.9)
Immature Grans (Abs): 0 10*3/uL (ref 0.0–0.1)
Immature Granulocytes: 0 %
Lymphocytes Absolute: 2.6 10*3/uL (ref 0.7–3.1)
Lymphs: 32 %
MCH: 27 pg (ref 26.6–33.0)
MCHC: 32.4 g/dL (ref 31.5–35.7)
MCV: 83 fL (ref 79–97)
Monocytes Absolute: 0.4 10*3/uL (ref 0.1–0.9)
Monocytes: 5 %
Neutrophils Absolute: 5 10*3/uL (ref 1.4–7.0)
Neutrophils: 60 %
Platelets: 285 10*3/uL (ref 150–450)
RBC: 5.04 x10E6/uL (ref 3.77–5.28)
RDW: 13.9 % (ref 11.7–15.4)
WBC: 8.2 10*3/uL (ref 3.4–10.8)

## 2020-04-17 LAB — VITAMIN D 25 HYDROXY (VIT D DEFICIENCY, FRACTURES): Vit D, 25-Hydroxy: 34.7 ng/mL (ref 30.0–100.0)

## 2020-04-17 LAB — HEPATITIS C ANTIBODY: Hep C Virus Ab: <0.1 s/co ratio (ref 0.0–0.9)

## 2020-04-17 LAB — TSH: TSH: 3.79 u[IU]/mL (ref 0.450–4.500)

## 2020-04-19 LAB — URINALYSIS, ROUTINE W REFLEX MICROSCOPIC
Bilirubin, UA: NEGATIVE
Leukocytes,UA: NEGATIVE
Nitrite, UA: NEGATIVE
Protein,UA: NEGATIVE
RBC, UA: NEGATIVE
Specific Gravity, UA: >1.03 — ABNORMAL HIGH (ref 1.005–1.030)
Urobilinogen, Ur: 0.2 mg/dL (ref 0.2–1.0)
pH, UA: 5 (ref 5.0–7.5)

## 2020-04-28 ENCOUNTER — Telehealth: Payer: Self-pay | Admitting: Obstetrics and Gynecology

## 2020-04-28 NOTE — Telephone Encounter (Signed)
Patient coming in on 05/11/2020 at 3:30 for nexplanon insertion

## 2020-05-04 NOTE — Telephone Encounter (Signed)
Noted. Will order to arrive by apt date/time. 

## 2020-05-05 ENCOUNTER — Telehealth: Payer: Medicaid Other

## 2020-05-07 ENCOUNTER — Telehealth: Payer: Self-pay | Admitting: Licensed Clinical Social Worker

## 2020-05-07 ENCOUNTER — Telehealth: Payer: Medicaid Other

## 2020-05-07 NOTE — Telephone Encounter (Signed)
  Chronic Care Management    Clinical Social Work General Follow Up Note  05/07/2020 Name: Sharon Rodriguez MRN: [id number] DOB: 1980-05-15  Sharon Rodriguez is a 40 y.o. year old female who is a primary care patient of Valerie Roys, DO. The CCM team was consulted for assistance with Mental Health Counseling and Resources.   Review of patient status, including review of consultants reports, relevant laboratory and other test results, and collaboration with appropriate care team members and the patient's provider was performed as part of comprehensive patient evaluation and provision of chronic care management services.    ,LCSW completed CCM outreach attempt today but was unable to reach patient successfully. A HIPPA compliant voice message was left encouraging patient to return call once available. LCSW will ask Scheduling Care Guide to reschedule CCM SW appointment with patient as well.  Outpatient Encounter Medications as of 05/07/2020  Medication Sig  . albuterol (PROVENTIL HFA;VENTOLIN HFA) 108 (90 Base) MCG/ACT inhaler Inhale 1 puff into the lungs every 6 (six) hours as needed for wheezing or shortness of breath.   Marland Kitchen albuterol (PROVENTIL) (2.5 MG/3ML) 0.083% nebulizer solution Frequency:Q4HPRN   Dosage:0.0     Instructions:  Note:Dose: 1  Dx:J42  . amphetamine-dextroamphetamine (ADDERALL) 30 MG tablet Take 1 tablet by mouth 2 (two) times daily.  . empagliflozin (JARDIANCE) 25 MG TABS tablet Take 1 tablet (25 mg total) by mouth daily before breakfast.  . levothyroxine (SYNTHROID) 75 MCG tablet Take 1 tablet (75 mcg total) by mouth daily. Synthroid brand name necessary  . Multiple Vitamin (MULTIVITAMIN) tablet Take 1 tablet by mouth daily.  . naproxen (NAPROSYN) 500 MG tablet Take 1 tablet (500 mg total) by mouth 2 (two) times daily with a meal.  . ondansetron (ZOFRAN ODT) 8 MG disintegrating tablet Take 1 tablet (8 mg total) by mouth every 8 (eight) hours as needed for nausea or vomiting.   Marland Kitchen oxyCODONE-acetaminophen (PERCOCET) 5-325 MG tablet Take 1 tablet by mouth every 6 (six) hours as needed for severe pain. Must last 30 days.  Derrill Memo ON 05/16/2020] oxyCODONE-acetaminophen (PERCOCET) 5-325 MG tablet Take 1 tablet by mouth every 6 (six) hours as needed for severe pain. Must last 30 days.  Derrill Memo ON 06/15/2020] oxyCODONE-acetaminophen (PERCOCET) 5-325 MG tablet Take 1 tablet by mouth every 6 (six) hours as needed for severe pain. Must last 30 days.  . Vitamin D, Ergocalciferol, (DRISDOL) 1.25 MG (50000 UNIT) CAPS capsule Take 1 capsule (50,000 Units total) by mouth once a week.   No facility-administered encounter medications on file as of 05/07/2020.    Follow Up Plan: [facility] [place] will reach out to patient to reschedule appointment.   Eula Fried, BSW, MSW, [facility] [email or web address] Phone: [phone]

## 2020-05-11 ENCOUNTER — Other Ambulatory Visit: Payer: Self-pay

## 2020-05-11 ENCOUNTER — Ambulatory Visit (INDEPENDENT_AMBULATORY_CARE_PROVIDER_SITE_OTHER): Payer: Medicare HMO | Admitting: Obstetrics and Gynecology

## 2020-05-11 VITALS — BP 124/70 | Ht 61.0 in | Wt 179.0 lb

## 2020-05-11 DIAGNOSIS — N946 Dysmenorrhea, unspecified: Secondary | ICD-10-CM

## 2020-05-11 DIAGNOSIS — N920 Excessive and frequent menstruation with regular cycle: Secondary | ICD-10-CM

## 2020-05-11 DIAGNOSIS — N6320 Unspecified lump in the left breast, unspecified quadrant: Secondary | ICD-10-CM | POA: Diagnosis not present

## 2020-05-11 NOTE — Progress Notes (Signed)
Obstetrics & Gynecology Office Visit   Chief Complaint  Patient presents with  . Annual Exam    Interested in nexplanon   History of Present Illness: 40 y.o. G9P2002 female who presents to discuss treatment for heavy bleeding and cramping with her periods.  She has monthly menses, lasting 7 days.  She denies intermenstrual bleeding.  Her heavy painful periods started this year.  She has never taken medication for the cramps. She does use essential oils and heating pads.  These do seem to help somewhat.  She is interested in trying the patch.    She also notes some breast pain. The pain has been present for about a month.  This is not timed with her periods.  She describes the pain as sharp. The pain is located on both breasts.  The pain comes and goes.  The pain is not localized.  Alleviating factors: none.  Aggravating factors: none.  Associated symptoms:  No galactorrhea, skin changes.  She denies any leakage from her nipples. She does not perform breast exams.  She has never had a mammogram.    Patient is a 40 y.o. [id number] presenting for contraception consult.  She is currently using vasectomy for contraception and desiring to start either the patch or the pill.  She has a past medical history significant for fatty liver.  She specifically denies a history of migraine with aura, chronic hypertension, history of DVT/PE and smoking.  Reported Patient's last menstrual period was 05/02/2020.   Her last level of liver enzymes was slightly elevated.   Last pap smear: 03/28/2017: NILM/HPV negative  Past Medical History:  Diagnosis Date  . ADD (attention deficit disorder)   . Anxiety   . Asthma    WELL CONTROLLED  . Biliary dyskinesia 12/27/2017  . Chronic bronchitis ([place])   . Chronic fatigue   . Chronic pain   . Depression   . Diabetes mellitus without complication ([place])   . Fibromyalgia   . GERD (gastroesophageal reflux disease)   . History of stomach ulcers 2019  . Hypothyroidism  01/27/2015  . Learning disability   . Osteoarthritis   . Rheumatoid arthritis ([place])   . Thyroid disease   . TMJ (dislocation of temporomandibular joint)    Past Surgical History:  Procedure Laterality Date  . CESAREAN SECTION     x 2  . CHOLECYSTECTOMY N/A 01/14/2018   Procedure: LAPAROSCOPIC CHOLECYSTECTOMY;  Surgeon: Vickie Epley, MD;  Location: [facility] ORS;  Service: General;  Laterality: N/A;  . ESOPHAGOGASTRODUODENOSCOPY (EGD) WITH PROPOFOL N/A 12/07/2017   Procedure: ESOPHAGOGASTRODUODENOSCOPY (EGD) WITH PROPOFOL;  Surgeon: Jonathon Bellows, MD;  Location: [facility] ENDOSCOPY;  Service: Gastroenterology;  Laterality: N/A;    Gynecologic History: Patient's last menstrual period was 05/02/2020.  Obstetric History: [id number]  Family History  Problem Relation Age of Onset  . Diabetes Mother   . ADD / ADHD Mother   . Learning disabilities Mother   . COPD Mother   . Diabetes Father   . Hyperlipidemia Father   . Mental illness Father        Depression/anxiety  . Mental illness Sister        Depression/Anxiety  . Arthritis Brother        Rheumatoid  . Mental illness Daughter        anxiety  . Thyroid disease Daughter   . ADD / ADHD Daughter   . Allergies Daughter   . Cancer Maternal Grandmother        Skin, Lung  .  Heart disease Maternal Grandfather   . Stroke Maternal Grandfather   . Heart attack Maternal Grandfather   . Arthritis Paternal Grandmother        Rheumatoid  . Dementia Paternal Grandmother   . Allergies Daughter     Social History   Socioeconomic History  . Marital status: Married    Spouse name: Not on file  . Number of children: Not on file  . Years of education: Not on file  . Highest education level: Not on file  Occupational History  . Not on file  Tobacco Use  . Smoking status: Never Smoker  . Smokeless tobacco: Never Used  Vaping Use  . Vaping Use: Never used  Substance and Sexual Activity  . Alcohol use: No  . Drug use: No  . Sexual  activity: Not Currently    Birth control/protection: None  Other Topics Concern  . Not on file  Social History Narrative  . Not on file   Social Determinants of Health   Financial Resource Strain: High Risk  . Difficulty of Paying Living Expenses: Hard  Food Insecurity: No Food Insecurity  . Worried About Charity fundraiser in the Last Year: Never true  . Ran Out of Food in the Last Year: Never true  Transportation Needs: No Transportation Needs  . Lack of Transportation (Medical): No  . Lack of Transportation (Non-Medical): No  Physical Activity: Sufficiently Active  . Days of Exercise per Week: 6 days  . Minutes of Exercise per Session: 30 min  Stress: No Stress Concern Present  . Feeling of Stress : Only a little  Social Connections: Not on file  Intimate Partner Violence: Not At Risk  . Fear of Current or Ex-Partner: No  . Emotionally Abused: No  . Physically Abused: No  . Sexually Abused: No   Allergies  Allergen Reactions  . Ibuprofen Diarrhea and Nausea Only    Other Reaction: GI Upset  . Metformin And Related Diarrhea  . Promethazine Rash   Prior to Admission medications   Medication Sig Start Date End Date Taking? Authorizing Provider  amphetamine-dextroamphetamine (ADDERALL) 30 MG tablet Take 1 tablet by mouth 2 (two) times daily. 03/21/18  Yes [provider]  empagliflozin (JARDIANCE) 25 MG TABS tablet Take 1 tablet (25 mg total) by mouth daily before breakfast. 03/03/20  Yes Johnson, Megan P, DO  levothyroxine (SYNTHROID) 75 MCG tablet Take 1 tablet (75 mcg total) by mouth daily. Synthroid brand name necessary 10/12/19  Yes Johnson, Megan P, DO  Multiple Vitamin (MULTIVITAMIN) tablet Take 1 tablet by mouth daily.   Yes [provider]  naproxen (NAPROSYN) 500 MG tablet Take 1 tablet (500 mg total) by mouth 2 (two) times daily with a meal. 10/06/19  Yes Johnson, Megan P, DO  ondansetron (ZOFRAN ODT) 8 MG disintegrating tablet Take 1 tablet (8 mg  total) by mouth every 8 (eight) hours as needed for nausea or vomiting. 10/06/19  Yes Johnson, Megan P, DO  oxyCODONE-acetaminophen (PERCOCET) 5-325 MG tablet Take 1 tablet by mouth every 6 (six) hours as needed for severe pain. Must last 30 days. 04/16/20 05/16/20 Yes Gillis Santa, MD  Vitamin D, Ergocalciferol, (DRISDOL) 1.25 MG (50000 UNIT) CAPS capsule Take 1 capsule (50,000 Units total) by mouth once a week. 10/06/19  Yes Johnson, Megan P, DO  albuterol (PROVENTIL HFA;VENTOLIN HFA) 108 (90 Base) MCG/ACT inhaler Inhale 1 puff into the lungs every 6 (six) hours as needed for wheezing or shortness of breath.  Patient  not taking: Reported on 05/11/2020    [provider]  albuterol (PROVENTIL) (2.5 MG/3ML) 0.083% nebulizer solution Frequency:Q4HPRN   Dosage:0.0     Instructions:  Note:Dose: 1  QM:V78 Patient not taking: Reported on 05/11/2020 07/22/15   Park Liter P, DO  oxyCODONE-acetaminophen (PERCOCET) 5-325 MG tablet Take 1 tablet by mouth every 6 (six) hours as needed for severe pain. Must last 30 days. 05/16/20 06/15/20  Gillis Santa, MD  oxyCODONE-acetaminophen (PERCOCET) 5-325 MG tablet Take 1 tablet by mouth every 6 (six) hours as needed for severe pain. Must last 30 days. 06/15/20 07/15/20  Gillis Santa, MD    Review of Systems  Constitutional: Negative.   HENT: Negative.   Eyes: Negative.   Respiratory: Negative.   Cardiovascular: Negative.   Gastrointestinal: Negative.   Genitourinary: Negative.   Musculoskeletal: Negative.   Skin: Negative.   Neurological: Negative.   Psychiatric/Behavioral: Negative.      Physical Exam BP 124/70   Ht 5\' 1"  (1.549 m)   Wt 179 lb (81.2 kg)   LMP 05/02/2020   BMI 33.82 kg/m  Patient's last menstrual period was 05/02/2020. Physical Exam Constitutional:      General: She is not in acute distress.    Appearance: Normal appearance.  Genitourinary:  Breasts:     Right: No swelling, bleeding, inverted nipple, mass, nipple discharge, skin  change, tenderness, axillary adenopathy or supraclavicular adenopathy.     Left: Mass present. No swelling, bleeding, inverted nipple, nipple discharge, skin change, tenderness, axillary adenopathy or supraclavicular adenopathy.    HENT:     Head: Normocephalic and atraumatic.  Eyes:     General: No scleral icterus.    Conjunctiva/sclera: Conjunctivae normal.  Chest:    Lymphadenopathy:     Upper Body:     Right upper body: No supraclavicular or axillary adenopathy.     Left upper body: No supraclavicular or axillary adenopathy.  Neurological:     General: No focal deficit present.     Mental Status: She is alert and oriented to person, place, and time.     Cranial Nerves: No cranial nerve deficit.  Psychiatric:        Mood and Affect: Mood normal.        Behavior: Behavior normal.        Judgment: Judgment normal.     Female chaperone present for pelvic and breast  portions of the physical exam  Assessment: 40 y.o. G79P2002 female here for  1. Menorrhagia with regular cycle   2. Dysmenorrhea      Plan: Problem List Items Addressed This Visit      Genitourinary   Dysmenorrhea   Relevant Medications   Levonorgestrel-Ethinyl Estradiol (AMETHIA) 0.15-0.03 &0.01 MG tablet    Other Visit Diagnoses    Menorrhagia with regular cycle    -  Primary   Relevant Medications   Levonorgestrel-Ethinyl Estradiol (AMETHIA) 0.15-0.03 &0.01 MG tablet     Ordered mammography, given pain and exam findings  For menorrhagia and dysmenorrhea will order extended cycle contraception.  Discussed benefits of this versus other methods. Discussed increased risk of VTE. She has no contraindications to hormonal contraception medicine.   A total of 38 minutes were spent face-to-face with the patient as well as preparation, review, communication, and documentation during this encounter.    Prentice Docker, MD 05/12/2020 5:39 PM

## 2020-05-12 MED ORDER — LEVONORGEST-ETH ESTRAD 91-DAY 0.15-0.03 &0.01 MG PO TABS
1.0000 | ORAL_TABLET | Freq: Every day | ORAL | 4 refills | Status: DC
Start: 2020-05-12 — End: 2020-07-29

## 2020-05-13 ENCOUNTER — Encounter: Payer: Self-pay | Admitting: Obstetrics and Gynecology

## 2020-05-14 ENCOUNTER — Other Ambulatory Visit: Payer: Self-pay | Admitting: Student in an Organized Health Care Education/Training Program

## 2020-05-14 DIAGNOSIS — G894 Chronic pain syndrome: Secondary | ICD-10-CM

## 2020-05-18 ENCOUNTER — Telehealth: Payer: Self-pay | Admitting: *Deleted

## 2020-05-18 NOTE — Chronic Care Management (AMB) (Signed)
  Care Management   Note  05/18/2020 Name: Sharon Rodriguez MRN: [id number] DOB: 03/06/1981  [name] is a 40 y.o. year old female who is a primary care patient of Valerie Roys, DO and is actively engaged with the care management team. I reached out to [name] by phone today to assist with re-scheduling a follow up visit with the RN Case Manager  Follow up plan: Unsuccessful telephone outreach attempt made. A HIPAA compliant phone message was left for the patient providing contact information and requesting a return call.  The care management team will reach out to the patient again over the next 7 days.  If patient returns call to provider office, please advise to call Mylo Lysle Morales at [facility]

## 2020-05-18 NOTE — Telephone Encounter (Signed)
Reschedule with RN CM [facility]

## 2020-05-24 ENCOUNTER — Ambulatory Visit
Admission: RE | Admit: 2020-05-24 | Discharge: 2020-05-24 | Disposition: A | Discharge status: Home / Self Care | Payer: Medicare HMO | Source: Ambulatory Visit | Attending: Obstetrics and Gynecology | Admitting: Obstetrics and Gynecology

## 2020-05-24 ENCOUNTER — Other Ambulatory Visit: Payer: Self-pay

## 2020-05-24 DIAGNOSIS — N6320 Unspecified lump in the left breast, unspecified quadrant: Secondary | ICD-10-CM | POA: Diagnosis not present

## 2020-05-24 DIAGNOSIS — N6325 Unspecified lump in the left breast, overlapping quadrants: Secondary | ICD-10-CM | POA: Diagnosis not present

## 2020-05-24 DIAGNOSIS — N644 Mastodynia: Secondary | ICD-10-CM | POA: Diagnosis not present

## 2020-05-24 DIAGNOSIS — R922 Inconclusive mammogram: Secondary | ICD-10-CM | POA: Diagnosis not present

## 2020-05-24 NOTE — Chronic Care Management (AMB) (Signed)
  Care Management   Note  05/24/2020 Name: TERIE LEAR MRN: [id number] DOB: 02-Jul-1980  LYNLEE STRATTON is a 40 y.o. year old female who is a primary care patient of Valerie Roys, DO and is actively engaged with the care management team. I reached out to [name] by phone today to assist with re-scheduling a follow up visit with the RN Case Manager  Follow up plan: Telephone appointment with care management team member scheduled for: 06/30/2020  [facility]

## 2020-06-16 ENCOUNTER — Ambulatory Visit: Payer: Medicare HMO | Admitting: Obstetrics and Gynecology

## 2020-06-22 ENCOUNTER — Other Ambulatory Visit: Payer: Self-pay | Admitting: Family Medicine

## 2020-06-29 ENCOUNTER — Ambulatory Visit: Payer: Medicare HMO | Admitting: Family Medicine

## 2020-06-30 ENCOUNTER — Ambulatory Visit (INDEPENDENT_AMBULATORY_CARE_PROVIDER_SITE_OTHER): Payer: Medicare HMO | Admitting: General Practice

## 2020-06-30 ENCOUNTER — Telehealth: Payer: Medicare HMO | Admitting: General Practice

## 2020-06-30 DIAGNOSIS — J42 Unspecified chronic bronchitis: Secondary | ICD-10-CM | POA: Diagnosis not present

## 2020-06-30 DIAGNOSIS — F339 Major depressive disorder, recurrent, unspecified: Secondary | ICD-10-CM

## 2020-06-30 DIAGNOSIS — F419 Anxiety disorder, unspecified: Secondary | ICD-10-CM

## 2020-06-30 DIAGNOSIS — R69 Illness, unspecified: Secondary | ICD-10-CM | POA: Diagnosis not present

## 2020-06-30 DIAGNOSIS — E1139 Type 2 diabetes mellitus with other diabetic ophthalmic complication: Secondary | ICD-10-CM

## 2020-06-30 NOTE — Patient Instructions (Signed)
Visit Information  PATIENT GOALS: Goals Addressed              This Visit's Progress   .  COMPLETED: DIET - INCREASE WATER INTAKE        Recommend drinking at least 6-8 glasses of water a day     .  RNCM: Monitor and Manage My Blood Sugar-Diabetes Type 2        Timeframe:  Short-Term Goal Priority:  High Start Date:                             Expected End Date:           10-09-2020            Follow Up Date 09-07-2020   - check blood sugar at prescribed times - check blood sugar before and after exercise - check blood sugar if I feel it is too high or too low - enter blood sugar readings and medication or insulin into daily log - take the blood sugar log to all doctor visits  -The patient does not have a working meter- will collaborate with the pcp and pharm D for assistance in getting a working meter   Why is this important?    Checking your blood sugar at home helps to keep it from getting very high or very low.   Writing the results in a diary or log helps the doctor know how to care for you.   Your blood sugar log should have the time, date and the results.   Also, write down the amount of insulin or other medicine that you take.   Other information, like what you ate, exercise done and how you were feeling, will also be helpful.     Notes: Per the patient the meter she received has never worked and she told the pharmacy. Will collaborate with pcp and pharm D for recommendations.     .  COMPLETED: RNCM: Pt- "I don't have a way to check my blood sugar" (pt-stated)        CARE PLAN ENTRY (see longtitudinal plan of care for additional care plan information)  Objective: Closing this goal and opening in new ELS Lab Results  Component Value Date   HGBA1C 9.4 (H) 04/04/2019 .   Lab Results  Component Value Date   CREATININE 0.72 04/04/2019   CREATININE 0.86 03/20/2013 .   Marland Kitchen No results found for: EGFR  Current Barriers:  Marland Kitchen Knowledge Deficits related to basic  Diabetes pathophysiology and self care/management . Knowledge Deficits related to medications used for management of diabetes . Does not have glucometer to monitor blood sugar . Limited Social Support . New labs in April of 2021 at [place] at the [facility] on [address] in [place] with no results available. Call made to [facility] by [facility] without getting any results to test . No glucose meter to check blood sugars . Unable to independently manage DM as evidence of last hemoglobin A1C in January 9.4. Had labwork in April but can not find the results in the system. Will collaborate with pcp.  Case Manager Clinical Goal(s):  Over the next 120 days, patient will demonstrate improved adherence to prescribed treatment plan for diabetes self care/management as evidenced by:  . daily monitoring and recording of CBG  . adherence to ADA/ carb modified diet . exercise 5 days/week . adherence to prescribed medication regimen  Interventions:  . Provided education to  patient about basic DM disease process . Reviewed medications with patient and discussed importance of medication adherence. 09-30-2019: The patient just recently refilled her Metformin through [facility] she said it is a different distributor than what she usually takes and it the medication is causing her to have headaches and severe tiredness again.  The patient stated: "I have no energy at all".  "I just want to sleep.". She said she has tried to tell them something is different but they say it is the same medication.  Will consult with the pcp and pharmacist to see what recommendations are.  . Discussed plans with patient for ongoing care management follow up and provided patient with direct contact information for care management team . Provided patient with written educational materials related to hypo and hyperglycemia and importance of correct treatment.  Will send information by myChart and the EMMI system on hypoglycemia/hyperglycemia  and other information on diabetes. 09-30-2019: The patient says she needs lost of education about how to effectively manage her diabetes. She has looked at videos but feels it needs to be one on one. Could not go to the dietician a few months ago because of diarrhea but wants to go now. Will discuss with pcp about new referral. She has an appointment 10-06-2019 to see pcp in the office.  . Reviewed scheduled/upcoming provider appointments including: 10-06-2019 with the pcp. . Advised patient, providing education and rationale, to check cbg daily and record, calling pcp for findings outside established parameters.  Per the patient she does not have a machine to check blood sugars daily. The patient is willing to check her blood sugars. Ask for a script to be sent to [facility] for obtaining a glucose meter. 09-30-2019: The patient still does not have a meter but knows she needs to check her blood sugars. Will ask for script to be sent to [facility] for script.  . Referral to the CCM team pharmacist for help with medication management and support for DM education.  . Assessed if the patient has labwork from April. Per the patient the patient states the MD office has to call the [facility] at [facility] to request the results. Will send an inbasket message to the [facility] clinical team to see if they can obtain results to have on hand when patient comes to the office on 10-06-2019.  Patient Self Care Activities:  . UNABLE to independently manage DM as evidence of elevated hemoglobin A1C . Checks blood sugars as prescribed and utilize hyper and hypoglycemia protocol as needed . Adheres to prescribed ADA/carb modified  Please see past updates related to this goal by clicking on the "Past Updates" button in the selected goal      .  COMPLETED: RNCM: Pt-"I feel tired all the time" (pt-stated)        CARE PLAN ENTRY (see longtitudinal plan of care for additional care plan information)  Current Barriers: Closing this  goal and opening in new ELS . Chronic Disease Management support, education, and care coordination needs related to Anxiety, Depression, Pulmonary Disease, and Hypothyroidism   Clinical Goal(s) related to Anxiety, Depression, Pulmonary Disease, and Hypothyroidism :  Over the next 120 days, patient will:  . Work with the care management team to address educational, disease management, and care coordination needs  . Begin or continue self health monitoring activities as directed today  ADA diet and use coping mechanisms for increased anxiety and depression.  . Call provider office for new or worsened signs and  symptoms Shortness of breath and New or worsened symptom related to chronic conditions . Call care management team with questions or concerns . Verbalize basic understanding of patient centered plan of care established today  Interventions related to Anxiety, Depression, Pulmonary Disease, and hypothyroidism :  . Evaluation of current treatment plans and patient's adherence to plan as established by provider . Assessed patient understanding of disease states . Assessed patient's education and care coordination needs . Provided disease specific education to patient. Educated the patient on talking to pcp at appointment about her activity level and being tired all the time. Also recommended she write down questions to ask the provider.  Nash Dimmer with appropriate clinical care team members regarding patient needs . Referral to LCSW for support and help with depression and anxiety. . Pharmacy referral for help with medication questions and concerns.   Patient Self Care Activities related to Anxiety, Depression, Pulmonary Disease, and Hypothyroidism :  . Patient is unable to independently self-manage chronic health conditions  Please see past updates related to this goal by clicking on the "Past Updates" button in the selected goal        Patient Care Plan: General Social Work (Adult)     Problem Identified: Anxiety Identification (Anxiety)     Goal: Anxiety Symptoms Identified   Start Date: 03/03/2020  Priority: Medium  Note:   Evidence-based guidance:   Assess for presence of additional co-occurring psychiatric comorbidity [e.g., substance use, other anxiety disorder (specific phobia, social anxiety disorder, panic disorder, agoraphobia, substance or medically-induced    anxiety disorder)].   Assess for presence of medical comorbidity (e.g., chronic pain, chronic illness), recent or recurrent trauma or abuse, family history of substance use disorder or mental illness.   Screen for anxiety using standardized, validated tool.   Move gradually from investigating somatic complaints to exploring social or psychologic distress.   Assess for signs and symptoms of anxiety in an atmosphere of hope and optimism.   Notes:    Task: Identify Anxiety Symptoms and Facilitate Treatment   Note:   Care Management Activities:    - participation in psychiatric services encouraged    Notes:    Patient Care Plan: RNCM: Chronic Bronchitis    Problem Identified: RNCM: Psychological Adjustment to Diagnosis : Chronic Bronchitis   Priority: Medium    Long-Range Goal: RNCM: Adjustment to Disease Achieved   Priority: Medium  Note:   Current Barriers:  Marland Kitchen Knowledge deficits related to basic understanding of chronic bronchitis disease process . Knowledge deficits related to basic chronic bronchitis  self care/management . Knowledge deficit related to basic understanding of how to use inhalers and how inhaled medications work . Knowledge deficit related to importance of energy conservation . Transportation barriers- states only has one car and her husband works. The patient states Medicaid will not help if there is a car in your name. Denies Care guide referral  . Limited Social Support . Unable to independently manage chronic bronchitis  . Lacks social connections . Does not  maintain contact with provider office . Does not contact provider office for questions/concerns  Case Manager Clinical Goal(s):  patient will report using inhalers as prescribed including rinsing mouth after use  patient will report utilizing pursed lip breathing for shortness of breath  patient will verbalize understanding of chronic bronchitis  action plan and when to seek appropriate levels of medical care  patient will engage in lite exercise as tolerated to build/regain stamina and strength and reduce shortness of breath  through activity tolerance  patient will verbalize basic understanding of chronic bronchitis  disease process and self care activities  Interventions:  . Collaboration with Valerie Roys, DO regarding development and update of comprehensive plan of care as evidenced by provider attestation and co-signature . Inter-disciplinary care team collaboration (see longitudinal plan of care)  Provided patient with basic written and verbal chronic bronchitis education on self care/management/and exacerbation prevention   Provided patient with chronic bronchitis action plan and reinforced importance of daily self assessment  Provided written and verbal instructions on pursed lip breathing and utilized returned demonstration as teach back  Provided instruction about proper use of medications used for management of chronic bronchitis including inhalers  Advised patient to self assesses chronic bronchitis  action plan zone and make appointment with provider if in the yellow zone for 48 hours without improvement.  Provided patient with education about the role of exercise in the management of chronic bronchitis   Advised patient to engage in light exercise as tolerated 3-5 days a week  Provided education about and advised patient to utilize infection prevention strategies to reduce risk of respiratory infection  Self-Care Activities:  Patient verbalizes understanding of plan  to manage chronic bronchitis effectively  Self administers medications as prescribed Attends all scheduled provider appointments Calls pharmacy for medication refills Attends church or other social activities Performs ADL's independently Performs IADL's independently Calls provider office for new concerns or questions Patient Goals: - do breathing exercises every day - do exercises in a comfortable position that makes breathing as easy as possible - develop a new routine to improve sleep - don't eat or exercise right before bedtime - eat healthy - get at least 7 to 8 hours of sleep at night - get outdoors every day (weather permitting) - keep room cool and dark - limit daytime naps - practice relaxation or meditation daily - use a fan or white noise in bedroom - develop a rescue plan - eliminate symptom triggers at home - follow rescue plan if symptoms flare-up - keep follow-up appointments - use an extra pillow to sleep - avoid second hand smoke - eliminate smoking in my home - identify and avoid work-related triggers - identify and remove indoor air pollutants - limit outdoor activity during cold weather - listen for public air quality announcements every day - depression screen reviewed - emotional support provided - problem-solving facilitated - relaxation techniques promoted - verbalization of feelings encouraged Follow Up Plan: Telephone follow up appointment with care management team member scheduled for: 09-07-2020 at 345 pm   Task: RNCM: Support Psychosocial Response to Chronic Bronchitis   Note:   Care Management Activities:    - depression screen reviewed - emotional support provided - problem-solving facilitated - relaxation techniques promoted - verbalization of feelings encouraged       Patient Care Plan: RNCM: Diabetes Type 2 (Adult)    Problem Identified: RNCM: Glycemic Management (Diabetes, Type 2)   Priority: Medium    Long-Range Goal: RNCM:  Glycemic Management Optimized   Priority: Medium  Note:   Objective:  Lab Results  Component Value Date   HGBA1C 6.5 04/16/2020 .   Lab Results  Component Value Date   CREATININE 0.88 04/16/2020   CREATININE 0.70 10/06/2019   CREATININE 0.72 04/04/2019 .   Marland Kitchen No results found for: EGFR Current Barriers:  Marland Kitchen Knowledge Deficits related to basic Diabetes pathophysiology and self care/management . Knowledge Deficits related to medications used for management of diabetes . Does  not use cbg meter  . Limited Social Support . Unable to independently check blood glucose readings. Patient states she does not have a working meter.  . Does not adhere to provider recommendations re:  Marland Kitchen Lacks social connections . Does not maintain contact with provider office . Does not contact provider office for questions/concerns Case Manager Clinical Goal(s):  . patient will demonstrate improved adherence to prescribed treatment plan for diabetes self care/management as evidenced by: daily monitoring and recording of CBG  adherence to ADA/ carb modified diet adherence to prescribed medication regimen contacting provider for new or worsened symptoms or questions Interventions:  . Collaboration with Valerie Roys, DO regarding development and update of comprehensive plan of care as evidenced by provider attestation and co-signature . Inter-disciplinary care team collaboration (see longitudinal plan of care) . Collaboration with pcp and pharm D. The patient does not have a working meter and she states she has told the pharmacy about this several times. Will see if can get the patient a script for a working meter. Also states that the Jardiance causs her to smell and taste the medications at all times, even when she urinates she can smell the medication.  . Provided education to patient about basic DM disease process . Reviewed medications with patient and discussed importance of medication adherence . Discussed  plans with patient for ongoing care management follow up and provided patient with direct contact information for care management team . Provided patient with written educational materials related to hypo and hyperglycemia and importance of correct treatment. The patient states she is not taking her blood sugars because sh does not have a working meter but she can tell when it is high or low. If it is low she gets weak and feels like she is going to pass out, if it is high she gets nauseated and has to go to bed. Extensive education on the need to check blood sugars on a consistent basis especially during episodes of highs or lows.  . Reviewed scheduled/upcoming provider appointments including: patient does not have an appointment for follow up with the pcp. Sent in basket message to the [facility] admin staff to reach out to the patient to schedule a follow up appointment  . Advised patient, providing education and rationale, to check cbg bid and record, calling pcp for findings outside established parameters.   . Referral made to pharmacy team for assistance with getting a blood glucose meter and compliance with diabetes management help . Review of patient status, including review of consultants reports, relevant laboratory and other test results, and medications completed. Self-Care Activities - UNABLE to independently check blood sugars as directed by provider, does not have a working meter. States she has told the pharmacy the meter does not work. Will collaborate with pcp and pharm D Attends all scheduled provider appointments Checks blood sugars as prescribed and utilize hyper and hypoglycemia protocol as needed Adheres to prescribed ADA/carb modified Patient Goals: - check blood sugar at prescribed times- currently does not have a working meter - check blood sugar before and after exercise - check blood sugar if I feel it is too high or too low - enter blood sugar readings and medication or insulin  into daily log - take the blood sugar log to all doctor visits - change to whole grain breads, cereal, pasta - drink 6 to 8 glasses of water each day - eat fish at least once per week - fill half of plate with vegetables -  limit fast food meals to no more than 1 per week - manage portion size - prepare main meal at home 3 to 5 days each week - read food labels for fat, fiber, carbohydrates and portion size - reduce red meat to 2 to 3 times a week - switch to low-fat or skim milk - switch to sugar-free drinks - schedule appointment with eye doctor - check feet daily for cuts, sores or redness - keep feet up while sitting - trim toenails straight across - wash and dry feet carefully every day - wear comfortable, cotton socks - wear comfortable, well-fitting shoes - barriers to adherence to treatment plan identified - blood glucose monitoring encouraged - resources required to improve adherence to care identified - self-awareness of signs/symptoms of hypo or hyperglycemia encouraged - use of blood glucose monitoring log promoted Follow Up Plan: Telephone follow up appointment with care management team member scheduled for: 09-07-2020 at 345 pm  Timeframe:  Short-Term Goal Priority:  High Start Date:                             Expected End Date:           10-09-2020            Follow Up Date 09-07-2020   - check blood sugar at prescribed times - check blood sugar before and after exercise - check blood sugar if I feel it is too high or too low - enter blood sugar readings and medication or insulin into daily log - take the blood sugar log to all doctor visits  -The patient does not have a working meter- will collaborate with the pcp and pharm D for assistance in getting a working meter   Why is this important?    Checking your blood sugar at home helps to keep it from getting very high or very low.   Writing the results in a diary or log helps the doctor know how to care for  you.   Your blood sugar log should have the time, date and the results.   Also, write down the amount of insulin or other medicine that you take.   Other information, like what you ate, exercise done and how you were feeling, will also be helpful.     Notes: Per the patient the meter she received has never worked and she told the pharmacy. Will collaborate with pcp and pharm D for recommendations.    Task: RNCM: Alleviate Barriers to Glycemic Management   Note:   Care Management Activities:    - barriers to adherence to treatment plan identified - blood glucose monitoring encouraged - resources required to improve adherence to care identified - self-awareness of signs/symptoms of hypo or hyperglycemia encouraged - use of blood glucose monitoring log promoted    Notes: Is not currently checking her blood sugars   Patient Care Plan: RNCM: Depression (Adult) and Anxiety    Problem Identified: RNCM: Depression Identification (Depression) and Anxiety   Priority: Medium    Long-Range Goal: RNCM: Depressive Symptoms Identified   Priority: Medium  Note:   Current Barriers:  . Chronic Disease Management support and education needs related to effective management of depression and anxiety  . Lacks caregiver support.  . Transportation barriers- declined care guide referral  . Unable to independently manage exacerbations with depression and anxiety  . Lacks social connections . Does not maintain contact with provider office . Does  not contact provider office for questions/concerns  Nurse Case Manager Clinical Goal(s):  . patient will verbalize understanding of plan for effective management of depression and anxiety  . patient will work with pcp, RNCM and LCSW to address needs related to changes in depression and anxiety  Interventions:  . 1:1 collaboration with Valerie Roys, DO regarding development and update of comprehensive plan of care as evidenced by provider attestation and  co-signature . Inter-disciplinary care team collaboration (see longitudinal plan of care) . Evaluation of current treatment plan related to depression and anxiety  and patient's adherence to plan as established by provider. . Advised patient to call the office for changes in mood, anxiety, or depression  . Provided education to patient re: making follow up appointment to see pcp, taking medicaitons as prescribed, and having a good social support system . Reviewed medications with patient and discussed compliance   Patient Goals/Self-Care Activities Over the next 120 days, patient will:  - Patient will self administer medications as prescribed Patient will attend all scheduled provider appointments Patient will call pharmacy for medication refills Patient will attend church or other social activities Patient will continue to perform ADL's independently Patient will continue to perform IADL's independently Patient will call provider office for new concerns or questions Patient will work with BSW to address care coordination needs and will continue to work with the clinical team to address health care and disease management related needs.   - anxiety screen reviewed - depression screen reviewed - medication list reviewed  Follow Up Plan: Telephone follow up appointment with care management team member scheduled for: 09-07-2020 at 345 pm       Task: RNCM: Identify Depressive Symptoms and Facilitate Treatment   Note:   Care Management Activities:    - anxiety screen reviewed - depression screen reviewed - medication list reviewed       Patient verbalizes understanding of instructions provided today and agrees to view in [place].   Telephone follow up appointment with care management team member scheduled for: 09-07-2020 at 65 pm  Noreene Larsson RN, MSN, [facility] Mobile: [phone]

## 2020-06-30 NOTE — Chronic Care Management (AMB) (Signed)
Chronic Care Management   CCM RN Visit Note  06/30/2020 Name: Sharon Rodriguez MRN: [id number] DOB: [birth year]  Subjective: Sharon Rodriguez is a 40 y.o. year old female who is a primary care patient of Valerie Roys, DO. The care management team was consulted for assistance with disease management and care coordination needs.    Engaged with patient by telephone for follow up visit in response to provider referral for case management and/or care coordination services.   Consent to Services:  The patient was given information about Chronic Care Management services, agreed to services, and gave verbal consent prior to initiation of services.  Please see initial visit note for detailed documentation.   Patient agreed to services and verbal consent obtained.   Assessment: Review of patient past medical history, allergies, medications, health status, including review of consultants reports, laboratory and other test data, was performed as part of comprehensive evaluation and provision of chronic care management services.   SDOH (Social Determinants of Health) assessments and interventions performed:    CCM Care Plan  Allergies  Allergen Reactions  . Ibuprofen Diarrhea and Nausea Only    Other Reaction: GI Upset  . Metformin And Related Diarrhea  . Promethazine Rash    Outpatient Encounter Medications as of 06/30/2020  Medication Sig  . albuterol (PROVENTIL HFA;VENTOLIN HFA) 108 (90 Base) MCG/ACT inhaler Inhale 1 puff into the lungs every 6 (six) hours as needed for wheezing or shortness of breath.  (Patient not taking: Reported on 05/11/2020)  . albuterol (PROVENTIL) (2.5 MG/3ML) 0.083% nebulizer solution Frequency:Q4HPRN   Dosage:0.0     Instructions:  Note:Dose: 1  NW:G95 (Patient not taking: Reported on 05/11/2020)  . amphetamine-dextroamphetamine (ADDERALL) 30 MG tablet Take 1 tablet by mouth 2 (two) times daily.  Marland Kitchen JARDIANCE 25 MG TABS tablet TAKE 1 TABLET BY MOUTH ONCE DAILY  BEFOREBREAKFAST  . Levonorgestrel-Ethinyl Estradiol (AMETHIA) 0.15-0.03 &0.01 MG tablet Take 1 tablet by mouth daily.  Marland Kitchen levothyroxine (SYNTHROID) 75 MCG tablet Take 1 tablet (75 mcg total) by mouth daily. Synthroid brand name necessary  . Multiple Vitamin (MULTIVITAMIN) tablet Take 1 tablet by mouth daily.  . naproxen (NAPROSYN) 500 MG tablet Take 1 tablet (500 mg total) by mouth 2 (two) times daily with a meal.  . ondansetron (ZOFRAN ODT) 8 MG disintegrating tablet Take 1 tablet (8 mg total) by mouth every 8 (eight) hours as needed for nausea or vomiting.  Marland Kitchen oxyCODONE-acetaminophen (PERCOCET) 5-325 MG tablet Take 1 tablet by mouth every 6 (six) hours as needed for severe pain. Must last 30 days.  . Vitamin D, Ergocalciferol, (DRISDOL) 1.25 MG (50000 UNIT) CAPS capsule Take 1 capsule (50,000 Units total) by mouth once a week.   No facility-administered encounter medications on file as of 06/30/2020.    Patient Active Problem List   Diagnosis Date Noted  . 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 Chronic diarrhea 10/06/2019  . Diabetes mellitus ([place]) 04/12/2019  . Gastroesophageal reflux disease 12/27/2017  . Chronic bilateral low back pain without sciatica 11/29/2017  . Chronic pain syndrome 11/29/2017  . Long term current use of opiate analgesic 11/29/2017  . Spondylosis without myelopathy or radiculopathy, lumbar region 09/24/2017  . Myofascial pain 09/24/2017  . Pelvic pain in female 05/10/2017  . Dysmenorrhea 05/10/2017  . Pubic bone pain 05/10/2017  . Controlled substance agreement signed 10/03/2016  . Vitamin D deficiency 07/06/2016  . Lower limb length difference 02/27/2015  . Hypothyroidism 01/27/2015  . Chronic pelvic pain in female 01/27/2015  .  Anxiety   . Rheumatoid arthritis ([place])   . Depression   . Fibromyalgia   . TMJ pain dysfunction syndrome   . Chronic bronchitis ([place])   . ADD (attention deficit disorder)   . Learning disability   . Acne 08/28/2011  . Benign  neoplasm of skin of trunk 08/28/2011  . 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    Conditions to be addressed/monitored:DMII, Anxiety, Depression and Pulmonary Disease  Care Plan : RNCM: Chronic Bronchitis  Updates made by Vanita Ingles since 06/30/2020 12:00 AM    Problem: RNCM: Psychological Adjustment to Diagnosis : Chronic Bronchitis   Priority: Medium    Long-Range Goal: RNCM: Adjustment to Disease Achieved   Priority: Medium  Note:   Current Barriers:  Marland Kitchen Knowledge deficits related to basic understanding of chronic bronchitis disease process . Knowledge deficits related to basic chronic bronchitis  self care/management . Knowledge deficit related to basic understanding of how to use inhalers and how inhaled medications work . Knowledge deficit related to importance of energy conservation . Transportation barriers- states only has one car and her husband works. The patient states Medicaid will not help if there is a car in your name. Denies Care guide referral  . Limited Social Support . Unable to independently manage chronic bronchitis  . Lacks social connections . Does not maintain contact with provider office . Does not contact provider office for questions/concerns  Case Manager Clinical Goal(s):  patient will report using inhalers as prescribed including rinsing mouth after use  patient will report utilizing pursed lip breathing for shortness of breath  patient will verbalize understanding of chronic bronchitis  action plan and when to seek appropriate levels of medical care  patient will engage in lite exercise as tolerated to build/regain stamina and strength and reduce shortness of breath through activity tolerance  patient will verbalize basic understanding of chronic bronchitis  disease process and self care activities  Interventions:  . Collaboration with Valerie Roys, DO regarding development and update of comprehensive plan of care as evidenced by  provider attestation and co-signature . Inter-disciplinary care team collaboration (see longitudinal plan of care)  Provided patient with basic written and verbal chronic bronchitis education on self care/management/and exacerbation prevention   Provided patient with chronic bronchitis action plan and reinforced importance of daily self assessment  Provided written and verbal instructions on pursed lip breathing and utilized returned demonstration as teach back  Provided instruction about proper use of medications used for management of chronic bronchitis including inhalers  Advised patient to self assesses chronic bronchitis  action plan zone and make appointment with provider if in the yellow zone for 48 hours without improvement.  Provided patient with education about the role of exercise in the management of chronic bronchitis   Advised patient to engage in light exercise as tolerated 3-5 days a week  Provided education about and advised patient to utilize infection prevention strategies to reduce risk of respiratory infection  Self-Care Activities:  Patient verbalizes understanding of plan to manage chronic bronchitis effectively  Self administers medications as prescribed Attends all scheduled provider appointments Calls pharmacy for medication refills Attends church or other social activities Performs ADL's independently Performs IADL's independently Calls provider office for new concerns or questions Patient Goals: - do breathing exercises every day - do exercises in a comfortable position that makes breathing as easy as possible - develop a new routine to improve sleep - don't eat or exercise right before bedtime - eat healthy -  get at least 7 to 8 hours of sleep at night - get outdoors every day (weather permitting) - keep room cool and dark - limit daytime naps - practice relaxation or meditation daily - use a fan or white noise in bedroom - develop a rescue plan -  eliminate symptom triggers at home - follow rescue plan if symptoms flare-up - keep follow-up appointments - use an extra pillow to sleep - avoid second hand smoke - eliminate smoking in my home - identify and avoid work-related triggers - identify and remove indoor air pollutants - limit outdoor activity during cold weather - listen for public air quality announcements every day - depression screen reviewed - emotional support provided - problem-solving facilitated - relaxation techniques promoted - verbalization of feelings encouraged Follow Up Plan: Telephone follow up appointment with care management team member scheduled for: 09-07-2020 at 345 pm   Task: RNCM: Support Psychosocial Response to Chronic Bronchitis   Note:   Care Management Activities:    - depression screen reviewed - emotional support provided - problem-solving facilitated - relaxation techniques promoted - verbalization of feelings encouraged       Care Plan : RNCM: Diabetes Type 2 (Adult)  Updates made by Vanita Ingles since 06/30/2020 12:00 AM    Problem: RNCM: Glycemic Management (Diabetes, Type 2)   Priority: Medium    Long-Range Goal: RNCM: Glycemic Management Optimized   Priority: Medium  Note:   Objective:  Lab Results  Component Value Date   HGBA1C 6.5 04/16/2020 .   Lab Results  Component Value Date   CREATININE 0.88 04/16/2020   CREATININE 0.70 10/06/2019   CREATININE 0.72 04/04/2019 .   Marland Kitchen No results found for: EGFR Current Barriers:  Marland Kitchen Knowledge Deficits related to basic Diabetes pathophysiology and self care/management . Knowledge Deficits related to medications used for management of diabetes . Does not use cbg meter  . Limited Social Support . Unable to independently check blood glucose readings. Patient states she does not have a working meter.  . Does not adhere to provider recommendations re:  Marland Kitchen Lacks social connections . Does not maintain contact with provider  office . Does not contact provider office for questions/concerns Case Manager Clinical Goal(s):  . patient will demonstrate improved adherence to prescribed treatment plan for diabetes self care/management as evidenced by: daily monitoring and recording of CBG  adherence to ADA/ carb modified diet adherence to prescribed medication regimen contacting provider for new or worsened symptoms or questions Interventions:  . Collaboration with Valerie Roys, DO regarding development and update of comprehensive plan of care as evidenced by provider attestation and co-signature . Inter-disciplinary care team collaboration (see longitudinal plan of care) . Collaboration with pcp and pharm D. The patient does not have a working meter and she states she has told the pharmacy about this several times. Will see if can get the patient a script for a working meter. Also states that the Jardiance causs her to smell and taste the medications at all times, even when she urinates she can smell the medication.  . Provided education to patient about basic DM disease process . Reviewed medications with patient and discussed importance of medication adherence . Discussed plans with patient for ongoing care management follow up and provided patient with direct contact information for care management team . Provided patient with written educational materials related to hypo and hyperglycemia and importance of correct treatment. The patient states she is not taking her blood sugars because sh  does not have a working meter but she can tell when it is high or low. If it is low she gets weak and feels like she is going to pass out, if it is high she gets nauseated and has to go to bed. Extensive education on the need to check blood sugars on a consistent basis especially during episodes of highs or lows.  . Reviewed scheduled/upcoming provider appointments including: patient does not have an appointment for follow up with the pcp.  Sent in basket message to the [facility] admin staff to reach out to the patient to schedule a follow up appointment  . Advised patient, providing education and rationale, to check cbg bid and record, calling pcp for findings outside established parameters.   . Referral made to pharmacy team for assistance with getting a blood glucose meter and compliance with diabetes management help . Review of patient status, including review of consultants reports, relevant laboratory and other test results, and medications completed. Self-Care Activities - UNABLE to independently check blood sugars as directed by provider, does not have a working meter. States she has told the pharmacy the meter does not work. Will collaborate with pcp and pharm D Attends all scheduled provider appointments Checks blood sugars as prescribed and utilize hyper and hypoglycemia protocol as needed Adheres to prescribed ADA/carb modified Patient Goals: - check blood sugar at prescribed times- currently does not have a working meter - check blood sugar before and after exercise - check blood sugar if I feel it is too high or too low - enter blood sugar readings and medication or insulin into daily log - take the blood sugar log to all doctor visits - change to whole grain breads, cereal, pasta - drink 6 to 8 glasses of water each day - eat fish at least once per week - fill half of plate with vegetables - limit fast food meals to no more than 1 per week - manage portion size - prepare main meal at home 3 to 5 days each week - read food labels for fat, fiber, carbohydrates and portion size - reduce red meat to 2 to 3 times a week - switch to low-fat or skim milk - switch to sugar-free drinks - schedule appointment with eye doctor - check feet daily for cuts, sores or redness - keep feet up while sitting - trim toenails straight across - wash and dry feet carefully every day - wear comfortable, cotton socks - wear comfortable,  well-fitting shoes - barriers to adherence to treatment plan identified - blood glucose monitoring encouraged - resources required to improve adherence to care identified - self-awareness of signs/symptoms of hypo or hyperglycemia encouraged - use of blood glucose monitoring log promoted Follow Up Plan: Telephone follow up appointment with care management team member scheduled for: 09-07-2020 at 345 pm  Timeframe:  Short-Term Goal Priority:  High Start Date:                             Expected End Date:           10-09-2020            Follow Up Date 09-07-2020   - check blood sugar at prescribed times - check blood sugar before and after exercise - check blood sugar if I feel it is too high or too low - enter blood sugar readings and medication or insulin into daily log - take the blood sugar  log to all doctor visits  -The patient does not have a working meter- will collaborate with the pcp and pharm D for assistance in getting a working meter   Why is this important?    Checking your blood sugar at home helps to keep it from getting very high or very low.   Writing the results in a diary or log helps the doctor know how to care for you.   Your blood sugar log should have the time, date and the results.   Also, write down the amount of insulin or other medicine that you take.   Other information, like what you ate, exercise done and how you were feeling, will also be helpful.     Notes: Per the patient the meter she received has never worked and she told the pharmacy. Will collaborate with pcp and pharm D for recommendations.    Task: RNCM: Alleviate Barriers to Glycemic Management   Note:   Care Management Activities:    - barriers to adherence to treatment plan identified - blood glucose monitoring encouraged - resources required to improve adherence to care identified - self-awareness of signs/symptoms of hypo or hyperglycemia encouraged - use of blood glucose monitoring  log promoted    Notes: Is not currently checking her blood sugars   Care Plan : RNCM: Depression (Adult) and Anxiety  Updates made by Vanita Ingles since 06/30/2020 12:00 AM    Problem: RNCM: Depression Identification (Depression) and Anxiety   Priority: Medium    Long-Range Goal: RNCM: Depressive Symptoms Identified   Priority: Medium  Note:   Current Barriers:  . Chronic Disease Management support and education needs related to effective management of depression and anxiety  . Lacks caregiver support.  . Transportation barriers- declined care guide referral  . Unable to independently manage exacerbations with depression and anxiety  . Lacks social connections . Does not maintain contact with provider office . Does not contact provider office for questions/concerns  Nurse Case Manager Clinical Goal(s):  . patient will verbalize understanding of plan for effective management of depression and anxiety  . patient will work with pcp, RNCM and LCSW to address needs related to changes in depression and anxiety  Interventions:  . 1:1 collaboration with Valerie Roys, DO regarding development and update of comprehensive plan of care as evidenced by provider attestation and co-signature . Inter-disciplinary care team collaboration (see longitudinal plan of care) . Evaluation of current treatment plan related to depression and anxiety  and patient's adherence to plan as established by provider. . Advised patient to call the office for changes in mood, anxiety, or depression  . Provided education to patient re: making follow up appointment to see pcp, taking medicaitons as prescribed, and having a good social support system . Reviewed medications with patient and discussed compliance   Patient Goals/Self-Care Activities Over the next 120 days, patient will:  - Patient will self administer medications as prescribed Patient will attend all scheduled provider appointments Patient will  call pharmacy for medication refills Patient will attend church or other social activities Patient will continue to perform ADL's independently Patient will continue to perform IADL's independently Patient will call provider office for new concerns or questions Patient will work with BSW to address care coordination needs and will continue to work with the clinical team to address health care and disease management related needs.   - anxiety screen reviewed - depression screen reviewed - medication list reviewed  Follow Up Plan: Telephone  follow up appointment with care management team member scheduled for: 09-07-2020 at 345 pm       Task: RNCM: Identify Depressive Symptoms and Facilitate Treatment   Note:   Care Management Activities:    - anxiety screen reviewed - depression screen reviewed - medication list reviewed       Plan:Telephone follow up appointment with care management team member scheduled for:  09-07-2020 at 55 pm  Noreene Larsson RN, MSN, [facility] Mobile: [phone]

## 2020-07-06 ENCOUNTER — Other Ambulatory Visit: Payer: Self-pay

## 2020-07-06 ENCOUNTER — Encounter: Payer: Self-pay | Admitting: Student in an Organized Health Care Education/Training Program

## 2020-07-06 ENCOUNTER — Ambulatory Visit
Payer: Medicare HMO | Attending: Student in an Organized Health Care Education/Training Program | Admitting: Student in an Organized Health Care Education/Training Program

## 2020-07-06 ENCOUNTER — Telehealth: Payer: Self-pay | Admitting: Family Medicine

## 2020-07-06 VITALS — BP 111/49 | HR 88 | Temp 97.3°F | Resp 16 | Ht 61.0 in | Wt 180.0 lb

## 2020-07-06 DIAGNOSIS — M7918 Myalgia, other site: Secondary | ICD-10-CM | POA: Diagnosis not present

## 2020-07-06 DIAGNOSIS — M47816 Spondylosis without myelopathy or radiculopathy, lumbar region: Secondary | ICD-10-CM | POA: Diagnosis not present

## 2020-07-06 DIAGNOSIS — G894 Chronic pain syndrome: Secondary | ICD-10-CM | POA: Insufficient documentation

## 2020-07-06 DIAGNOSIS — Z79891 Long term (current) use of opiate analgesic: Secondary | ICD-10-CM | POA: Insufficient documentation

## 2020-07-06 DIAGNOSIS — M797 Fibromyalgia: Secondary | ICD-10-CM | POA: Diagnosis not present

## 2020-07-06 DIAGNOSIS — M26629 Arthralgia of temporomandibular joint, unspecified side: Secondary | ICD-10-CM | POA: Diagnosis not present

## 2020-07-06 MED ORDER — OXYCODONE-ACETAMINOPHEN 5-325 MG PO TABS: 1.0000 | ORAL_TABLET | Freq: Four times a day (QID) | ORAL | 0 refills | Status: AC | PRN

## 2020-07-06 MED ORDER — OXYCODONE-ACETAMINOPHEN 5-325 MG PO TABS: 1.0000 | ORAL_TABLET | Freq: Four times a day (QID) | ORAL | 0 refills | Status: DC | PRN

## 2020-07-06 NOTE — Progress Notes (Signed)
Nursing Pain Medication Assessment:  Safety precautions to be maintained throughout the outpatient stay will include: orient to surroundings, keep bed in low position, maintain call bell within reach at all times, provide assistance with transfer out of bed and ambulation.  Medication Inspection Compliance: Pill count conducted under aseptic conditions, in front of the patient. Neither the pills nor the bottle was removed from the patient's sight at any time. Once count was completed pills were immediately returned to the patient in their original bottle.  Medication: Oxycodone/APAP Pill/Patch Count: 33 of 120 pills remain Pill/Patch Appearance: Markings consistent with prescribed medication Bottle Appearance: Standard pharmacy container. Clearly labeled. Filled Date: 4 / 5 / 2022 Last Medication intake:  Today

## 2020-07-06 NOTE — Telephone Encounter (Signed)
Patient declined rescheduling at this time.  She wants to speak with Dr Wynetta Emery to see if she needs to reschedule [facility].  Stated that she has so many appointments already scheduled with the office and feels it is not necessary to have another with [facility]

## 2020-07-06 NOTE — Progress Notes (Signed)
PROVIDER NOTE: Information contained herein reflects review and annotations entered in association with encounter. Interpretation of such information and data should be left to medically-trained personnel. Information provided to patient can be located elsewhere in the medical record under "Patient Instructions". Document created using STT-dictation technology, any transcriptional errors that may result from process are unintentional.    Patient: Sharon Rodriguez  Service Category: E/M  Provider: Gillis Santa, MD  DOB: 1980-06-12  DOS: 07/06/2020  Specialty: Interventional Pain Management  MRN: [id number]  Setting: Ambulatory outpatient  PCP: Valerie Roys, DO  Type: Established Patient    Referring Provider: Valerie Roys, DO  Location: Office  Delivery: Face-to-face     HPI  Ms. OCEANNA ARRUDA, a 40 y.o. year old female, is here today because of her Chronic pain syndrome [G89.4]. Ms. Wickstrom primary complain today is Back Pain (lower) Last encounter: My last encounter with her was on 05/14/2020. Pertinent problems: Ms. Bunyan has Hypothyroidism; Rheumatoid arthritis ([place]); Depression; Fibromyalgia; TMJ pain dysfunction syndrome; H/O juvenile rheumatoid arthritis; Spondylosis without myelopathy or radiculopathy, lumbar region; Chronic bilateral low back pain without sciatica; Chronic pain syndrome; and Long term current use of opiate analgesic on their pertinent problem list. Pain Assessment: Severity of Chronic pain is reported as a 8 /10. Location: Back Lower/ . Onset:  . Quality:  . Timing:  . Modifying factor(s): meds. Vitals:  height is 5' 1"  (1.549 m) and weight is 180 lb (81.6 kg). Her temporal temperature is 97.3 F (36.3 C) (abnormal). Her blood pressure is 111/49 (abnormal) and her pulse is 88. Her respiration is 16 and oxygen saturation is 98%.   Reason for encounter: medication management.    No change in medical history since last visit.  Patient's pain is at baseline.  Patient  continues multimodal pain regimen as prescribed.  States that it provides pain relief and improvement in functional status.   Pharmacotherapy Assessment   06/15/2020  04/15/2020   1  Oxycodone-Acetaminophen 5-325  120.00  30  Bi Lat  [id number]  786 (3732)  0/0  30.00 MME  Comm Ins  [place]      Analgesic: Percocet 5 mg 4 times daily as needed, quantity 120/month; MME equals 30    Monitoring:  PMP: PDMP reviewed during this encounter.       Pharmacotherapy: No side-effects or adverse reactions reported. Compliance: No problems identified. Effectiveness: Clinically acceptable.  [name], RN  07/06/2020 12:52 PM  Sign when Signing Visit Nursing Pain Medication Assessment:  Safety precautions to be maintained throughout the outpatient stay will include: orient to surroundings, keep bed in low position, maintain call bell within reach at all times, provide assistance with transfer out of bed and ambulation.  Medication Inspection Compliance: Pill count conducted under aseptic conditions, in front of the patient. Neither the pills nor the bottle was removed from the patient's sight at any time. Once count was completed pills were immediately returned to the patient in their original bottle.  Medication: Oxycodone/APAP Pill/Patch Count: 33 of 120 pills remain Pill/Patch Appearance: Markings consistent with prescribed medication Bottle Appearance: Standard pharmacy container. Clearly labeled. Filled Date: 4 / 5 / 2022 Last Medication intake:  Today    UDS:  Summary  Date Value Ref Range Status  10/15/2019 Note  Final    Comment:    ==================================================================== [facility] (MW) ==================================================================== Test  Result       Flag       Units  Drug Present and Declared for Prescription Verification   Oxycodone                      871          EXPECTED   ng/mg creat    Oxymorphone                    902          EXPECTED   ng/mg creat   Noroxycodone                   1166         EXPECTED   ng/mg creat   Noroxymorphone                 193          EXPECTED   ng/mg creat    Sources of oxycodone are scheduled prescription medications.    Oxymorphone, noroxycodone, and noroxymorphone are expected    metabolites of oxycodone. Oxymorphone is also available as a    scheduled prescription medication.  Drug Absent but Declared for Prescription Verification   Amphetamine                    Not Detected UNEXPECTED ng/mg creat ==================================================================== Test                      Result    Flag   Units      Ref Range   Creatinine              192              mg/dL      >=20 ==================================================================== Declared Medications:  The flagging and interpretation on this report are based on the  following declared medications.  Unexpected results may arise from  inaccuracies in the declared medications.   **Note: The testing scope of this panel includes these medications:   Amphetamine (Adderall)  Oxycodone (Percocet)   **Note: The testing scope of this panel does not include the  following reported medications:   Acetaminophen (Percocet)  Albuterol (Ventolin HFA)  Levothyroxine (Synthroid)  Metformin (Glucophage)  Naproxen (Naprosyn)  Ondansetron (Zofran)  Vitamin D2 (Drisdol) ==================================================================== For clinical consultation, please call [phone]. ====================================================================      ROS  Constitutional: Denies any fever or chills Gastrointestinal: No reported hemesis, hematochezia, vomiting, or acute GI distress Musculoskeletal: diffuse MSK pain Neurological: No reported episodes of acute onset apraxia, aphasia, dysarthria, agnosia, amnesia, paralysis, loss of coordination, or loss of  consciousness  Medication Review  Levonorgestrel-Ethinyl Estradiol, Vitamin D (Ergocalciferol), albuterol, amphetamine-dextroamphetamine, empagliflozin, levothyroxine, multivitamin, naproxen, ondansetron, and oxyCODONE-acetaminophen  History Review  Allergy: Ms. Eschete is allergic to ibuprofen, metformin and related, and promethazine. Drug: Ms. Tremain  reports no history of drug use. Alcohol:  reports no history of alcohol use. Tobacco:  reports that she has never smoked. She has never used smokeless tobacco. Social: Ms. Heide  reports that she has never smoked. She has never used smokeless tobacco. She reports that she does not drink alcohol and does not use drugs. Medical:  has a past medical history of ADD (attention deficit disorder), Anxiety, Asthma, Biliary dyskinesia (12/27/2017), Chronic bronchitis ([place]), Chronic fatigue, Chronic pain, Depression, Diabetes mellitus without complication ([place]), Fibromyalgia, GERD (gastroesophageal reflux disease), History of stomach ulcers (2019), Hypothyroidism (  01/27/2015), Learning disability, Osteoarthritis, Rheumatoid arthritis ([place]), Thyroid disease, and TMJ (dislocation of temporomandibular joint). Surgical: Ms. Uplinger  has a past surgical history that includes Cesarean section; Esophagogastroduodenoscopy (egd) with propofol (N/A, 12/07/2017); and Cholecystectomy (N/A, 01/14/2018). Family: family history includes ADD / ADHD in her daughter and mother; Allergies in her daughter and daughter; Arthritis in her brother and paternal grandmother; COPD in her mother; Cancer in her maternal grandmother; Dementia in her paternal grandmother; Diabetes in her father and mother; Heart attack in her maternal grandfather; Heart disease in her maternal grandfather; Hyperlipidemia in her father; Learning disabilities in her mother; Mental illness in her daughter, father, and sister; Stroke in her maternal grandfather; Thyroid disease in her daughter.  Laboratory Chemistry  Profile   Renal Lab Results  Component Value Date   BUN 11 04/16/2020   CREATININE 0.88 04/16/2020   BCR 13 04/16/2020   GFRAA 94 04/16/2020   GFRNONAA 82 04/16/2020     Hepatic Lab Results  Component Value Date   AST 52 (H) 04/16/2020   ALT 35 (H) 04/16/2020   ALBUMIN 4.4 04/16/2020   ALKPHOS 70 04/16/2020   LIPASE 43 04/10/2017     Electrolytes Lab Results  Component Value Date   NA 139 04/16/2020   K 4.5 04/16/2020   CL 102 04/16/2020   CALCIUM 9.1 04/16/2020     Bone Lab Results  Component Value Date   VD25OH 34.7 04/16/2020     Inflammation (CRP: Acute Phase) (ESR: Chronic Phase) No results found for: CRP, ESRSEDRATE, LATICACIDVEN     Note: Above Lab results reviewed.  Recent Imaging Review  US BREAST LTD UNI LEFT INC AXILLA CLINICAL DATA:  Patient describes palpable lump within the outer breast. Patient also describes diffuse bilateral breast pain. This is patient's first mammogram.  EXAM: DIGITAL DIAGNOSTIC BILATERAL MAMMOGRAM WITH TOMOSYNTHESIS AND CAD; ULTRASOUND LEFT BREAST LIMITED  TECHNIQUE: Bilateral digital diagnostic mammography and breast tomosynthesis was performed. The images were evaluated with computer-aided detection.; Targeted ultrasound examination of the left breast was performed  COMPARISON:  None.  ACR Breast Density Category b: There are scattered areas of fibroglandular density.  FINDINGS: Bilateral diagnostic mammogram: There are no dominant masses, suspicious calcifications or secondary signs of malignancy within either breast. Specifically, there is no mammographic abnormality within the outer LEFT breast corresponding to the area of clinical concern, with overlying skin marker in place.  Targeted ultrasound is performed, evaluating the upper-outer quadrant of the LEFT breast with particular attention to the 2 o'clock axis as directed by the patient, showing only normal fibroglandular tissues and fat lobules  throughout. No solid or cystic mass.  IMPRESSION: No evidence of malignancy within either breast.  RECOMMENDATION: 1.  Screening mammogram in one year.(Code:SM-B-01Y) 2. The patient was instructed to return sooner if the area that she feels becomes larger and/or firmer to palpation, or if a new palpable abnormality is identified in either breast.  I have discussed the findings and recommendations with the patient. If applicable, a reminder letter will be sent to the patient regarding the next appointment.  BI-RADS CATEGORY  1: Negative.  Electronically Signed   By: Franki Cabot M.D.   On: 05/24/2020 11:19 MM DIAG BREAST TOMO BILATERAL CLINICAL DATA:  Patient describes palpable lump within the outer breast. Patient also describes diffuse bilateral breast pain. This is patient's first mammogram.  EXAM: DIGITAL DIAGNOSTIC BILATERAL MAMMOGRAM WITH TOMOSYNTHESIS AND CAD; ULTRASOUND LEFT BREAST LIMITED  TECHNIQUE: Bilateral digital diagnostic mammography and breast tomosynthesis was performed.  The images were evaluated with computer-aided detection.; Targeted ultrasound examination of the left breast was performed  COMPARISON:  None.  ACR Breast Density Category b: There are scattered areas of fibroglandular density.  FINDINGS: Bilateral diagnostic mammogram: There are no dominant masses, suspicious calcifications or secondary signs of malignancy within either breast. Specifically, there is no mammographic abnormality within the outer LEFT breast corresponding to the area of clinical concern, with overlying skin marker in place.  Targeted ultrasound is performed, evaluating the upper-outer quadrant of the LEFT breast with particular attention to the 2 o'clock axis as directed by the patient, showing only normal fibroglandular tissues and fat lobules throughout. No solid or cystic mass.  IMPRESSION: No evidence of malignancy within either  breast.  RECOMMENDATION: 1.  Screening mammogram in one year.(Code:SM-B-01Y) 2. The patient was instructed to return sooner if the area that she feels becomes larger and/or firmer to palpation, or if a new palpable abnormality is identified in either breast.  I have discussed the findings and recommendations with the patient. If applicable, a reminder letter will be sent to the patient regarding the next appointment.  BI-RADS CATEGORY  1: Negative.  Electronically Signed   By: Franki Cabot M.D.   On: 05/24/2020 11:19 Note: Reviewed        Physical Exam  General appearance: Well nourished, well developed, and well hydrated. In no apparent acute distress Mental status: Alert, oriented x 3 (person, place, & time)       Respiratory: No evidence of acute respiratory distress Eyes: PERLA Vitals: BP (!) 111/49   Pulse 88   Temp (!) 97.3 F (36.3 C) (Temporal)   Resp 16   Ht 5' 1"  (1.549 m)   Wt 180 lb (81.6 kg)   LMP 06/22/2020 (Exact Date)   SpO2 98%   BMI 34.01 kg/m  BMI: Estimated body mass index is 34.01 kg/m as calculated from the following:   Height as of this encounter: 5' 1"  (1.549 m).   Weight as of this encounter: 180 lb (81.6 kg). Ideal: Ideal body weight: 47.8 kg (105 lb 6.1 oz) Adjusted ideal body weight: 61.3 kg (135 lb 3.6 oz)    Lumbar Spine Area Exam  Skin & Axial Inspection:No masses, redness, or swelling Alignment:Symmetrical Functional AJO:INOMVEHMC ROM Stability:No instability detected Muscle Tone/Strength:Functionally intact. No obvious neuro-muscular anomalies detected. Sensory (Neurological):Articular pain pattern Palpation:Complains of area being tender to palpationBilateral Fist Percussion Test Provocative Tests: Lumbar Hyperextension and rotation test:Positivebilaterally for facet joint pain.   Gait & Posture Assessment  Ambulation:Unassisted Gait:Relatively normal for age and body  habitus Posture:WNL  Lower Extremity Exam    Side:Right lower extremity  Side:Left lower extremity  Stability:No instability observed  Stability:No instability observed  Skin & Extremity Inspection:Skin color, temperature, and hair growth are WNL. No peripheral edema or cyanosis. No masses, redness, swelling, asymmetry, or associated skin lesions. No contractures.  Skin & Extremity Inspection:Skin color, temperature, and hair growth are WNL. No peripheral edema or cyanosis. No masses, redness, swelling, asymmetry, or associated skin lesions. No contractures.  Functional NOB:SJGGEZMOQHUT ROM   Functional MLY:YTKPTWSFKCLE ROM   Muscle Tone/Strength:Functionally intact. No obvious neuro-muscular anomalies detected.  Muscle Tone/Strength:Functionally intact. No obvious neuro-muscular anomalies detected.  Sensory (Neurological):Unimpaired  Sensory (Neurological):Unimpaired  Palpation:No palpable anomalies  Palpation:No palpable anomalies     Assessment   Status Diagnosis  Controlled Controlled Controlled 1. Chronic pain syndrome   2. Spondylosis without myelopathy or radiculopathy, lumbar region   3. Long term current use of opiate  analgesic   4. Fibromyalgia   5. TMJ pain dysfunction syndrome   6. Myofascial pain      Plan of Care  Ms. MYAH GUYNES has a current medication list which includes the following long-term medication(s): albuterol, albuterol, levonorgestrel-ethinyl estradiol, and levothyroxine.  Pharmacotherapy (Medications Ordered): Meds ordered this encounter  Medications  . oxyCODONE-acetaminophen (PERCOCET) 5-325 MG tablet    Sig: Take 1 tablet by mouth every 6 (six) hours as needed for severe pain. Must last 30 days.    Dispense:  120 tablet    Refill:  0    [place] STOP ACT - Not applicable. Fill one day early if pharmacy is closed on scheduled refill date.  Marland Kitchen oxyCODONE-acetaminophen (PERCOCET)  5-325 MG tablet    Sig: Take 1 tablet by mouth every 6 (six) hours as needed for severe pain. Must last 30 days.    Dispense:  120 tablet    Refill:  0    [place] STOP ACT - Not applicable. Fill one day early if pharmacy is closed on scheduled refill date.  Marland Kitchen oxyCODONE-acetaminophen (PERCOCET) 5-325 MG tablet    Sig: Take 1 tablet by mouth every 6 (six) hours as needed for severe pain. Must last 30 days.    Dispense:  120 tablet    Refill:  0    [place] STOP ACT - Not applicable. Fill one day early if pharmacy is closed on scheduled refill date.   Follow-up plan:   Return in about 3 months (around 10/05/2020) for Medication Management, in person.   Recent Visits Date Type Provider Dept  04/15/20 Office Visit Gillis Santa, MD [facility]  Showing recent visits within past 90 days and meeting all other requirements Today's Visits Date Type Provider Dept  07/06/20 Office Visit Gillis Santa, MD [facility]  Showing today's visits and meeting all other requirements Future Appointments No visits were found meeting these conditions. Showing future appointments within next 90 days and meeting all other requirements  I discussed the assessment and treatment plan with the patient. The patient was provided an opportunity to ask questions and all were answered. The patient agreed with the plan and demonstrated an understanding of the instructions.  Patient advised to call back or seek an in-person evaluation if the symptoms or condition worsens.  Duration of encounter: 30 minutes.  Note by: Gillis Santa, MD Date: 07/06/2020; Time: 1:03 PM

## 2020-07-08 ENCOUNTER — Ambulatory Visit: Payer: Medicare Other

## 2020-07-09 ENCOUNTER — Telehealth: Payer: Medicare HMO | Admitting: Family Medicine

## 2020-07-19 DIAGNOSIS — R69 Illness, unspecified: Secondary | ICD-10-CM | POA: Diagnosis not present

## 2020-07-19 DIAGNOSIS — F411 Generalized anxiety disorder: Secondary | ICD-10-CM | POA: Diagnosis not present

## 2020-07-29 ENCOUNTER — Ambulatory Visit (INDEPENDENT_AMBULATORY_CARE_PROVIDER_SITE_OTHER): Payer: Medicare HMO | Admitting: Family Medicine

## 2020-07-29 ENCOUNTER — Encounter: Payer: Self-pay | Admitting: Family Medicine

## 2020-07-29 ENCOUNTER — Other Ambulatory Visit: Payer: Self-pay

## 2020-07-29 VITALS — BP 108/62 | HR 102 | Temp 98.3°F | Ht 61.5 in | Wt 170.0 lb

## 2020-07-29 DIAGNOSIS — E039 Hypothyroidism, unspecified: Secondary | ICD-10-CM

## 2020-07-29 DIAGNOSIS — J42 Unspecified chronic bronchitis: Secondary | ICD-10-CM | POA: Diagnosis not present

## 2020-07-29 DIAGNOSIS — R5382 Chronic fatigue, unspecified: Secondary | ICD-10-CM | POA: Diagnosis not present

## 2020-07-29 DIAGNOSIS — R69 Illness, unspecified: Secondary | ICD-10-CM | POA: Diagnosis not present

## 2020-07-29 DIAGNOSIS — M069 Rheumatoid arthritis, unspecified: Secondary | ICD-10-CM

## 2020-07-29 DIAGNOSIS — F339 Major depressive disorder, recurrent, unspecified: Secondary | ICD-10-CM | POA: Diagnosis not present

## 2020-07-29 DIAGNOSIS — E1139 Type 2 diabetes mellitus with other diabetic ophthalmic complication: Secondary | ICD-10-CM

## 2020-07-29 DIAGNOSIS — E559 Vitamin D deficiency, unspecified: Secondary | ICD-10-CM

## 2020-07-29 LAB — URINALYSIS, ROUTINE W REFLEX MICROSCOPIC
Bilirubin, UA: NEGATIVE
Ketones, UA: NEGATIVE
Leukocytes,UA: NEGATIVE
Nitrite, UA: NEGATIVE
Protein,UA: NEGATIVE
RBC, UA: NEGATIVE
Specific Gravity, UA: 1.015 (ref 1.005–1.030)
Urobilinogen, Ur: 0.2 mg/dL (ref 0.2–1.0)
pH, UA: 5 (ref 5.0–7.5)

## 2020-07-29 LAB — MICROALBUMIN, URINE WAIVED
Creatinine, Urine Waived: 100 mg/dL (ref 10–300)
Microalb, Ur Waived: 10 mg/L (ref 0–19)
Microalb/Creat Ratio: <30 mg/g (ref <30)

## 2020-07-29 LAB — BAYER DCA HB A1C WAIVED: HB A1C (BAYER DCA - WAIVED): 6.2 % (ref <7.0)

## 2020-07-29 NOTE — Assessment & Plan Note (Signed)
Rechecking labs today. Await results. Treat as needed.  °

## 2020-07-29 NOTE — Assessment & Plan Note (Signed)
Not under good control. Has not discussed with her psychiatrist. Likely contributing to her fatigue. Will get her back in with her psychiatrist to see if we can get her mood under better control. Continue to monitor closely.

## 2020-07-29 NOTE — Assessment & Plan Note (Signed)
Continue to follow with rheumatology. Call with any concerns.  

## 2020-07-29 NOTE — Assessment & Plan Note (Signed)
Under good control on current regimen. Continue current regimen. Continue to monitor. Call with any concerns.   

## 2020-07-29 NOTE — Progress Notes (Signed)
BP 108/62   Pulse (!) 102   Temp 98.3 F (36.8 C)   Ht 5' 1.5" (1.562 m)   Wt 170 lb (77.1 kg)   SpO2 96%   BMI 31.60 kg/m    Subjective:    Patient ID: Sharon Rodriguez, female    DOB: 1980-12-04, 40 y.o.   MRN: [id number]  HPI: Sharon Rodriguez is a 40 y.o. female  Chief Complaint  Patient presents with  . Diabetes  . Depression  . Hypothyroidism   DIABETES Hypoglycemic episodes:yes Polydipsia/polyuria: yes Visual disturbance: yes Chest pain: no Paresthesias: no Glucose Monitoring: yes  Accucheck frequency: Daily Taking Insulin?: no  Long acting insulin:  Short acting insulin: Blood Pressure Monitoring: not checking Retinal Examination: Not up to Date Foot Exam: Up to Date Diabetic Education: Completed Pneumovax: Up to Date Influenza: Up to Date Aspirin: no  DEPRESSION Mood status: uncontrolled Satisfied with current treatment?: yes Symptom severity: moderate  Duration of current treatment : not on anything Depressed mood: yes Anxious mood: yes Anhedonia: no Significant weight loss or gain: no Insomnia: yes  Fatigue: yes Feelings of worthlessness or guilt: yes Impaired concentration/indecisiveness: yes Suicidal ideations: no Hopelessness: no Crying spells: no Depression screen [facility] 2/9 07/29/2020 01/15/2020 01/01/2020 10/15/2019 08/08/2019  Decreased Interest 2 0 3 0 0  Down, Depressed, Hopeless 2 0 3 0 3  PHQ - 2 Score 4 0 6 0 3  Altered sleeping 3 - 3 - 1  Tired, decreased energy 3 - 3 - 3  Change in appetite 0 - 3 - 0  Feeling bad or failure about yourself  1 - 3 - 3  Trouble concentrating 3 - 3 - 0  Moving slowly or fidgety/restless 3 - 3 - 3  Suicidal thoughts 0 - 0 - 0  PHQ-9 Score 17 - 24 - 13  Difficult doing work/chores Extremely dIfficult - Very difficult - Extremely dIfficult  Some recent data might be hidden   HYPOTHYROIDISM Thyroid control status:unsure Satisfied with current treatment? no Medication side effects: no Medication  compliance: good compliance Recent dose adjustment:no Fatigue: yes Cold intolerance: no Heat intolerance: no Weight gain: no Weight loss: no Constipation: no Diarrhea/loose stools: no Palpitations: yes Lower extremity edema: no Anxiety/depressed mood: yes   Relevant past medical, surgical, family and social history reviewed and updated as indicated. Interim medical history since our last visit reviewed. Allergies and medications reviewed and updated.  Review of Systems  Constitutional: Positive for fatigue. Negative for activity change, appetite change, chills, diaphoresis, fever and unexpected weight change.  Respiratory: Negative.   Cardiovascular: Negative.   Gastrointestinal: Negative.   Musculoskeletal: Negative.   Skin: Negative.   Neurological: Negative.   Psychiatric/Behavioral: Positive for decreased concentration and dysphoric mood. Negative for agitation, behavioral problems, confusion, hallucinations, self-injury, sleep disturbance and suicidal ideas. The patient is not nervous/anxious and is not hyperactive.     Per HPI unless specifically indicated above     Objective:    BP 108/62   Pulse (!) 102   Temp 98.3 F (36.8 C)   Ht 5' 1.5" (1.562 m)   Wt 170 lb (77.1 kg)   SpO2 96%   BMI 31.60 kg/m   Wt Readings from Last 3 Encounters:  07/29/20 170 lb (77.1 kg)  07/06/20 180 lb (81.6 kg)  05/11/20 179 lb (81.2 kg)    Physical Exam Vitals and nursing note reviewed.  Constitutional:      General: She is not in acute distress.  Appearance: Normal appearance. She is not ill-appearing, toxic-appearing or diaphoretic.  HENT:     Head: Normocephalic and atraumatic.     Right Ear: External ear normal.     Left Ear: External ear normal.     Nose: Nose normal.     Mouth/Throat:     Mouth: Mucous membranes are moist.     Pharynx: Oropharynx is clear.  Eyes:     General: No scleral icterus.       Right eye: No discharge.        Left eye: No discharge.      Extraocular Movements: Extraocular movements intact.     Conjunctiva/sclera: Conjunctivae normal.     Pupils: Pupils are equal, round, and reactive to light.  Cardiovascular:     Rate and Rhythm: Normal rate and regular rhythm.     Pulses: Normal pulses.     Heart sounds: Normal heart sounds. No murmur heard. No friction rub. No gallop.   Pulmonary:     Effort: Pulmonary effort is normal. No respiratory distress.     Breath sounds: Normal breath sounds. No stridor. No wheezing, rhonchi or rales.  Chest:     Chest wall: No tenderness.  Musculoskeletal:        General: Normal range of motion.     Cervical back: Normal range of motion and neck supple.  Skin:    General: Skin is warm and dry.     Capillary Refill: Capillary refill takes less than 2 seconds.     Coloration: Skin is not jaundiced or pale.     Findings: No bruising, erythema, lesion or rash.  Neurological:     General: No focal deficit present.     Mental Status: She is alert and oriented to person, place, and time. Mental status is at baseline.  Psychiatric:        Mood and Affect: Mood normal.        Behavior: Behavior normal.        Thought Content: Thought content normal.        Judgment: Judgment normal.     Results for orders placed or performed in visit on 07/29/20  Bayer DCA Hb A1c Waived  Result Value Ref Range   HB A1C (BAYER DCA - WAIVED) 6.2 <7.0 %  Microalbumin, Urine Waived  Result Value Ref Range   Microalb, Ur Waived 10 0 - 19 mg/L   Creatinine, Urine Waived 100 10 - 300 mg/dL   Microalb/Creat Ratio <30 <30 mg/g  Urinalysis, Routine w reflex microscopic  Result Value Ref Range   Specific Gravity, UA 1.015 1.005 - 1.030   pH, UA 5.0 5.0 - 7.5   Color, UA Yellow Yellow   Appearance Ur Clear Clear   Leukocytes,UA Negative Negative   Protein,UA Negative Negative/Trace   Glucose, UA 3+ (A) Negative   Ketones, UA Negative Negative   RBC, UA Negative Negative   Bilirubin, UA Negative Negative    Urobilinogen, Ur 0.2 0.2 - 1.0 mg/dL   Nitrite, UA Negative Negative      Assessment & Plan:   Problem List Items Addressed This Visit      Respiratory   Chronic bronchitis ([place])    Under good control on current regimen. Continue current regimen. Continue to monitor. Call with any concerns.           Endocrine   Hypothyroidism    Does not feel well. Feels like her thyroid is not well treated. Will check labs today.  Would like to change to armour thyroid rather than levothyroxine. Await labs and treat as needed.       Relevant Orders   CBC with Differential/Platelet   Thyroid Panel With TSH   Diabetes mellitus ([place]) - Primary    Doing well with A1c of 6.2- we will stop jardaince and recheck 3 months. Call with any concerns.       Relevant Orders   CBC with Differential/Platelet   Bayer DCA Hb A1c Waived (Completed)   Comprehensive metabolic panel   Lipid Panel w/o Chol/HDL Ratio   Microalbumin, Urine Waived (Completed)   Urinalysis, Routine w reflex microscopic (Completed)   Magnesium     Musculoskeletal and Integument   Rheumatoid arthritis ([facility])    Continue to follow with rheumatology. Call with any concerns.         Other   Depression    Not under good control. Has not discussed with her psychiatrist. Likely contributing to her fatigue. Will get her back in with her psychiatrist to see if we can get her mood under better control. Continue to monitor closely.       Relevant Orders   CBC with Differential/Platelet   Vitamin D deficiency    Rechecking labs today. Await results. Treat as needed.       Relevant Orders   CBC with Differential/Platelet   VITAMIN D 25 Hydroxy (Vit-D Deficiency, Fractures)    Other Visit Diagnoses    Chronic fatigue       Likely multi-factorial. Checking labs today. Significant concern for mood contributing. Will get her in with psych ASAP. Await results.    Relevant Orders   B12       Follow up plan: Return in about 3  months (around 10/29/2020).

## 2020-07-29 NOTE — Assessment & Plan Note (Signed)
Does not feel well. Feels like her thyroid is not well treated. Will check labs today. Would like to change to armour thyroid rather than levothyroxine. Await labs and treat as needed.

## 2020-07-29 NOTE — Assessment & Plan Note (Signed)
Doing well with A1c of 6.2- we will stop jardaince and recheck 3 months. Call with any concerns.

## 2020-07-30 ENCOUNTER — Other Ambulatory Visit: Payer: Self-pay | Admitting: Family Medicine

## 2020-07-30 ENCOUNTER — Encounter: Payer: Self-pay | Admitting: Family Medicine

## 2020-07-30 DIAGNOSIS — E039 Hypothyroidism, unspecified: Secondary | ICD-10-CM

## 2020-07-30 DIAGNOSIS — F900 Attention-deficit hyperactivity disorder, predominantly inattentive type: Secondary | ICD-10-CM | POA: Diagnosis not present

## 2020-07-30 DIAGNOSIS — R69 Illness, unspecified: Secondary | ICD-10-CM | POA: Diagnosis not present

## 2020-07-30 LAB — CBC WITH DIFFERENTIAL/PLATELET
Basophils Absolute: 0.1 10*3/uL (ref 0.0–0.2)
Basos: 1 %
EOS (ABSOLUTE): 0.1 10*3/uL (ref 0.0–0.4)
Eos: 1 %
Hematocrit: 43.1 % (ref 34.0–46.6)
Hemoglobin: 13.8 g/dL (ref 11.1–15.9)
Immature Grans (Abs): 0 10*3/uL (ref 0.0–0.1)
Immature Granulocytes: 0 %
Lymphocytes Absolute: 3 10*3/uL (ref 0.7–3.1)
Lymphs: 32 %
MCH: 26.1 pg — ABNORMAL LOW (ref 26.6–33.0)
MCHC: 32 g/dL (ref 31.5–35.7)
MCV: 82 fL (ref 79–97)
Monocytes Absolute: 0.5 10*3/uL (ref 0.1–0.9)
Monocytes: 5 %
Neutrophils Absolute: 5.7 10*3/uL (ref 1.4–7.0)
Neutrophils: 61 %
Platelets: 279 10*3/uL (ref 150–450)
RBC: 5.28 x10E6/uL (ref 3.77–5.28)
RDW: 14.5 % (ref 11.7–15.4)
WBC: 9.3 10*3/uL (ref 3.4–10.8)

## 2020-07-30 LAB — LIPID PANEL W/O CHOL/HDL RATIO
Cholesterol, Total: 187 mg/dL (ref 100–199)
HDL: 39 mg/dL — ABNORMAL LOW (ref >39)
LDL Chol Calc (NIH): 103 mg/dL — ABNORMAL HIGH (ref 0–99)
Triglycerides: 264 mg/dL — ABNORMAL HIGH (ref 0–149)
VLDL Cholesterol Cal: 45 mg/dL — ABNORMAL HIGH (ref 5–40)

## 2020-07-30 LAB — THYROID PANEL WITH TSH
Free Thyroxine Index: 2.3 (ref 1.2–4.9)
T3 Uptake Ratio: 24 % (ref 24–39)
T4, Total: 9.4 ug/dL (ref 4.5–12.0)
TSH: 2.61 u[IU]/mL (ref 0.450–4.500)

## 2020-07-30 LAB — VITAMIN D 25 HYDROXY (VIT D DEFICIENCY, FRACTURES): Vit D, 25-Hydroxy: 31.1 ng/mL (ref 30.0–100.0)

## 2020-07-30 LAB — COMPREHENSIVE METABOLIC PANEL
ALT: 17 IU/L (ref 0–32)
AST: 27 IU/L (ref 0–40)
Albumin/Globulin Ratio: 1.5 (ref 1.2–2.2)
Albumin: 4.4 g/dL (ref 3.8–4.8)
Alkaline Phosphatase: 83 IU/L (ref 44–121)
BUN/Creatinine Ratio: 14 (ref 9–23)
BUN: 13 mg/dL (ref 6–24)
Bilirubin Total: 0.2 mg/dL (ref 0.0–1.2)
CO2: 22 mmol/L (ref 20–29)
Calcium: 9.5 mg/dL (ref 8.7–10.2)
Chloride: 100 mmol/L (ref 96–106)
Creatinine, Ser: 0.95 mg/dL (ref 0.57–1.00)
Globulin, Total: 2.9 g/dL (ref 1.5–4.5)
Glucose: 180 mg/dL — ABNORMAL HIGH (ref 65–99)
Potassium: 4.3 mmol/L (ref 3.5–5.2)
Sodium: 139 mmol/L (ref 134–144)
Total Protein: 7.3 g/dL (ref 6.0–8.5)
eGFR: 77 mL/min/{1.73_m2} (ref >59)

## 2020-07-30 LAB — MAGNESIUM: Magnesium: 2.2 mg/dL (ref 1.6–2.3)

## 2020-07-30 LAB — VITAMIN B12: Vitamin B-12: 321 pg/mL (ref 232–1245)

## 2020-07-30 MED ORDER — THYROID 60 MG PO TABS: 60.0000 mg | ORAL_TABLET | Freq: Every day | ORAL | 1 refills | Status: DC

## 2020-08-02 ENCOUNTER — Telehealth: Payer: Self-pay

## 2020-08-02 NOTE — Telephone Encounter (Signed)
PA approved, patient notified.

## 2020-08-02 NOTE — Telephone Encounter (Signed)
PA for Armour Thyroid initiated and submitted via Cover My Meds. Key: Sharon Rodriguez

## 2020-08-03 NOTE — Telephone Encounter (Signed)
Sam from [facility] called in stated pt reached out to him regarding PA, and states that it was approved on clinic side. Sam  States on pharmacy side he had not received an update. Requesting to have a nurse follow up with more information

## 2020-08-04 NOTE — Telephone Encounter (Signed)
Sam from the Pharmacy called in stating that on the pharmacy side the patients PA has not been approved yet, requesting to speak with [place].

## 2020-08-04 NOTE — Telephone Encounter (Signed)
Sharon Rodriguez said she just got off the phone with sam pharm and medication will not go through. Please advise

## 2020-08-04 NOTE — Telephone Encounter (Signed)
Called and spoke to pharmacist, Let them know that the PA was approved for the Armour Thyroid.

## 2020-08-04 NOTE — Telephone Encounter (Signed)
Resubmitted PA for Armour Thyroid, approved through the end of the year.   Called and notified Sam. Prescription ran and is showing approved on their end now.

## 2020-08-14 ENCOUNTER — Other Ambulatory Visit: Payer: Self-pay | Admitting: Family Medicine

## 2020-08-30 ENCOUNTER — Other Ambulatory Visit: Payer: Self-pay | Admitting: Family Medicine

## 2020-08-30 NOTE — Telephone Encounter (Signed)
  Notes to clinic: Patient requesting a 90 day supply    Requested Prescriptions  Pending Prescriptions Disp Refills   ARMOUR THYROID 60 MG tablet [Pharmacy Med Name: ARMOUR THYROID 60 MG TAB] 30 tablet 1    Sig: TAKE 1 TABLET BY MOUTH ONCE DAILY ON AN EMPTY STOMACH. WAIT 30 MINUTES BEFORE TAKING OTHER MEDS.      Endocrinology:  Hypothyroid Agents Failed - 08/30/2020  9:24 AM      Failed - TSH needs to be rechecked within 3 months after an abnormal result. Refill until TSH is due.      Passed - TSH in normal range and within 360 days    TSH  Date Value Ref Range Status  07/29/2020 2.610 0.450 - 4.500 uIU/mL Final          Passed - Valid encounter within last 12 months    Recent Outpatient Visits           1 month ago Type 2 diabetes mellitus with other ophthalmic complication, without long-term current use of insulin ([place])   [place], Megan P, DO   5 months ago Type 2 diabetes mellitus with other ophthalmic complication, without long-term current use of insulin ([place])   [place], Megan P, DO   6 months ago Type 2 diabetes mellitus with other ophthalmic complication, without long-term current use of insulin ([place])   [place], Megan P, DO   8 months ago Pneumonia due to COVID-19 virus   [employer], Megan P, DO   10 months ago Hypothyroidism, unspecified type   [facility] [place], Barb Merino, DO       Future Appointments             In 2 months Wynetta Emery, Barb Merino, DO [employer]

## 2020-08-30 NOTE — Telephone Encounter (Signed)
Scheduled 8/19

## 2020-09-07 ENCOUNTER — Ambulatory Visit (INDEPENDENT_AMBULATORY_CARE_PROVIDER_SITE_OTHER): Payer: Medicare HMO | Admitting: General Practice

## 2020-09-07 ENCOUNTER — Telehealth: Payer: Medicaid Other | Admitting: General Practice

## 2020-09-07 DIAGNOSIS — R69 Illness, unspecified: Secondary | ICD-10-CM | POA: Diagnosis not present

## 2020-09-07 DIAGNOSIS — J42 Unspecified chronic bronchitis: Secondary | ICD-10-CM | POA: Diagnosis not present

## 2020-09-07 DIAGNOSIS — E1139 Type 2 diabetes mellitus with other diabetic ophthalmic complication: Secondary | ICD-10-CM | POA: Diagnosis not present

## 2020-09-07 DIAGNOSIS — F339 Major depressive disorder, recurrent, unspecified: Secondary | ICD-10-CM

## 2020-09-07 DIAGNOSIS — F419 Anxiety disorder, unspecified: Secondary | ICD-10-CM

## 2020-09-07 NOTE — Chronic Care Management (AMB) (Signed)
Chronic Care Management   CCM RN Visit Note  09/07/2020 Name: Sharon Rodriguez MRN: [id number] DOB: [birth year]  Subjective: Sharon Rodriguez is a 40 y.o. year old female who is a primary care patient of Valerie Roys, DO. The care management team was consulted for assistance with disease management and care coordination needs.    Engaged with patient by telephone for follow up visit in response to provider referral for case management and/or care coordination services.   Consent to Services:  The patient was given information about Chronic Care Management services, agreed to services, and gave verbal consent prior to initiation of services.  Please see initial visit note for detailed documentation.   Patient agreed to services and verbal consent obtained.   Assessment: Review of patient past medical history, allergies, medications, health status, including review of consultants reports, laboratory and other test data, was performed as part of comprehensive evaluation and provision of chronic care management services.   SDOH (Social Determinants of Health) assessments and interventions performed:    CCM Care Plan  Allergies  Allergen Reactions   Ibuprofen Diarrhea and Nausea Only    Other Reaction: GI Upset   Metformin And Related Diarrhea   Promethazine Rash    Outpatient Encounter Medications as of 09/07/2020  Medication Sig   albuterol (PROVENTIL HFA;VENTOLIN HFA) 108 (90 Base) MCG/ACT inhaler Inhale 1 puff into the lungs every 6 (six) hours as needed for wheezing or shortness of breath.   albuterol (PROVENTIL) (2.5 MG/3ML) 0.083% nebulizer solution Frequency:Q4HPRN   Dosage:0.0     Instructions:  Note:Dose: 1  Dx:J42 (Patient taking differently: Frequency:Q4HPRN   Dosage:0.0     Instructions:  Note:Dose: 1  Dx:J42)   amphetamine-dextroamphetamine (ADDERALL) 30 MG tablet Take 1 tablet by mouth 2 (two) times daily.   Multiple Vitamin (MULTIVITAMIN) tablet Take 1 tablet by mouth  daily.   naproxen (NAPROSYN) 500 MG tablet Take 1 tablet (500 mg total) by mouth 2 (two) times daily with a meal.   ondansetron (ZOFRAN ODT) 8 MG disintegrating tablet Take 1 tablet (8 mg total) by mouth every 8 (eight) hours as needed for nausea or vomiting.   oxyCODONE-acetaminophen (PERCOCET) 5-325 MG tablet Take 1 tablet by mouth every 6 (six) hours as needed for severe pain. Must last 30 days.   [START ON 09/13/2020] oxyCODONE-acetaminophen (PERCOCET) 5-325 MG tablet Take 1 tablet by mouth every 6 (six) hours as needed for severe pain. Must last 30 days.   thyroid (ARMOUR THYROID) 60 MG tablet Take 1 tablet (60 mg total) by mouth daily before breakfast.   No facility-administered encounter medications on file as of 09/07/2020.    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   Chronic diarrhea 10/06/2019   Diabetes mellitus ([place]) 04/12/2019   Gastroesophageal reflux disease 12/27/2017   Chronic bilateral low back pain without sciatica 11/29/2017   Chronic pain syndrome 11/29/2017   Long term current use of opiate analgesic 11/29/2017   Spondylosis without myelopathy or radiculopathy, lumbar region 09/24/2017   Myofascial pain 09/24/2017   Pelvic pain in female 05/10/2017   Dysmenorrhea 05/10/2017   Pubic bone pain 05/10/2017   Controlled substance agreement signed 10/03/2016   Vitamin D deficiency 07/06/2016   Lower limb length difference 02/27/2015   Hypothyroidism 01/27/2015   Chronic pelvic pain in female 01/27/2015   Anxiety    Rheumatoid arthritis ([place])    Depression    Fibromyalgia    TMJ pain dysfunction  syndrome    Chronic bronchitis (HCC)    ADD (attention deficit disorder)    Learning disability    Acne 08/28/2011   Benign neoplasm of skin of trunk 08/28/2011   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    Conditions to be addressed/monitored:DMII, Anxiety, Depression, and Pulmonary Disease  Care Plan : RNCM: Chronic  Bronchitis  Updates made by Vanita Ingles since 09/07/2020 12:00 AM     Problem: RNCM: Psychological Adjustment to Diagnosis : Chronic Bronchitis   Priority: Medium     Long-Range Goal: RNCM: Adjustment to Disease Achieved   Start Date: 06/30/2020  Expected End Date: 05/05/2021  This Visit's Progress: On track  Priority: Medium  Note:   Current Barriers:  Knowledge deficits related to basic understanding of chronic bronchitis disease process Knowledge deficits related to basic chronic bronchitis  self care/management Knowledge deficit related to basic understanding of how to use inhalers and how inhaled medications work Knowledge deficit related to importance of energy conservation Transportation barriers- states only has one car and her husband works. The patient states Medicaid will not help if there is a car in your name. Denies Care guide referral  Limited Social Support Unable to independently manage chronic bronchitis  Lacks social connections Does not maintain contact with provider office Does not contact provider office for questions/concerns  Case Manager Clinical Goal(s): patient will report using inhalers as prescribed including rinsing mouth after use patient will report utilizing pursed lip breathing for shortness of breath patient will verbalize understanding of chronic bronchitis  action plan and when to seek appropriate levels of medical care patient will engage in lite exercise as tolerated to build/regain stamina and strength and reduce shortness of breath through activity tolerance patient will verbalize basic understanding of chronic bronchitis  disease process and self care activities  Interventions:  Collaboration with Valerie Roys, DO regarding development and update of comprehensive plan of care as evidenced by provider attestation and co-signature Inter-disciplinary care team collaboration (see longitudinal plan of care) Provided patient with basic written  and verbal chronic bronchitis education on self care/management/and exacerbation prevention. 09-07-2020: The patient denies any issues with her breathing at this time. Stays tired but does not know if that is related to hypothyroidism or just in general. She feels like she will always have issues with being tired. Pacing activity and denies any acute distress at this time. Will continue to monitor.  Provided patient with chronic bronchitis action plan and reinforced importance of daily self assessment Provided written and verbal instructions on pursed lip breathing and utilized returned demonstration as teach back Provided instruction about proper use of medications used for management of chronic bronchitis including inhalers Advised patient to self assesses chronic bronchitis  action plan zone and make appointment with provider if in the yellow zone for 48 hours without improvement. Provided patient with education about the role of exercise in the management of chronic bronchitis  Advised patient to engage in light exercise as tolerated 3-5 days a week Provided education about and advised patient to utilize infection prevention strategies to reduce risk of respiratory infection. 09-07-2020: Review of sx and sx of infection and precautions to take to prevent exacerbation of conditions.  Self-Care Activities:  Patient verbalizes understanding of plan to manage chronic bronchitis effectively  Self administers medications as prescribed Attends all scheduled provider appointments Calls pharmacy for medication refills Attends church or other social activities Performs ADL's independently Performs IADL's independently Calls provider office for new concerns or questions Patient  Goals: - do breathing exercises every day - do exercises in a comfortable position that makes breathing as easy as possible - develop a new routine to improve sleep - don't eat or exercise right before bedtime - eat healthy -  get at least 7 to 8 hours of sleep at night - get outdoors every day (weather permitting) - keep room cool and dark - limit daytime naps - practice relaxation or meditation daily - use a fan or white noise in bedroom - develop a rescue plan - eliminate symptom triggers at home - follow rescue plan if symptoms flare-up - keep follow-up appointments - use an extra pillow to sleep - avoid second hand smoke - eliminate smoking in my home - identify and avoid work-related triggers - identify and remove indoor air pollutants - limit outdoor activity during cold weather - listen for public air quality announcements every day - depression screen reviewed - emotional support provided - problem-solving facilitated - relaxation techniques promoted - verbalization of feelings encouraged Follow Up Plan: Telephone follow up appointment with care management team member scheduled for: 11-09-2020 at 345 pm    Task: RNCM: Support Psychosocial Response to Chronic Bronchitis Completed 09/07/2020  Outcome: Positive  Note:   Care Management Activities:    - depression screen reviewed - emotional support provided - problem-solving facilitated - relaxation techniques promoted - verbalization of feelings encouraged        Care Plan : RNCM: Diabetes Type 2 (Adult)  Updates made by Vanita Ingles since 09/07/2020 12:00 AM  Completed 09/07/2020   Problem: RNCM: Glycemic Management (Diabetes, Type 2) Resolved 09/07/2020  Priority: Medium     Long-Range Goal: RNCM: Glycemic Management Optimized Completed 09/07/2020  Start Date: 06/30/2020  Expected End Date: 09/29/2020  This Visit's Progress: On track  Priority: Medium  Note:   Objective: Patient is off of all medications for DM and no longer has to check her blood sugars per the pcp. Praised for accomplishments.  Lab Results  Component Value Date   HGBA1C 6.2 07/29/2020    Lab Results  Component Value Date   HGBA1C 6.5 04/16/2020   Lab  Results  Component Value Date   CREATININE 0.88 04/16/2020   CREATININE 0.70 10/06/2019   CREATININE 0.72 04/04/2019   No results found for: EGFR Current Barriers:  Knowledge Deficits related to basic Diabetes pathophysiology and self care/management Knowledge Deficits related to medications used for management of diabetes Does not use cbg meter  Limited Social Support Unable to independently check blood glucose readings. Patient states she does not have a working meter.  Does not adhere to provider recommendations re:  Lacks social connections Does not maintain contact with provider office Does not contact provider office for questions/concerns Case Manager Clinical Goal(s):  patient will demonstrate improved adherence to prescribed treatment plan for diabetes self care/management as evidenced by: daily monitoring and recording of CBG  adherence to ADA/ carb modified diet adherence to prescribed medication regimen contacting provider for new or worsened symptoms or questions Interventions:  Collaboration with Valerie Roys, DO regarding development and update of comprehensive plan of care as evidenced by provider attestation and co-signature Inter-disciplinary care team collaboration (see longitudinal plan of care) Collaboration with pcp and pharm D. The patient does not have a working meter and she states she has told the pharmacy about this several times. Will see if can get the patient a script for a working meter. Also states that the Jardiance causs her to smell and  taste the medications at all times, even when she urinates she can smell the medication.  Provided education to patient about basic DM disease process Reviewed medications with patient and discussed importance of medication adherence Discussed plans with patient for ongoing care management follow up and provided patient with direct contact information for care management team Provided patient with written educational  materials related to hypo and hyperglycemia and importance of correct treatment. The patient states she is not taking her blood sugars because sh does not have a working meter but she can tell when it is high or low. If it is low she gets weak and feels like she is going to pass out, if it is high she gets nauseated and has to go to bed. Extensive education on the need to check blood sugars on a consistent basis especially during episodes of highs or lows.  Reviewed scheduled/upcoming provider appointments including: patient does not have an appointment for follow up with the pcp. Sent in basket message to the [facility] admin staff to reach out to the patient to schedule a follow up appointment  Advised patient, providing education and rationale, to check cbg bid and record, calling pcp for findings outside established parameters.   Referral made to pharmacy team for assistance with getting a blood glucose meter and compliance with diabetes management help Review of patient status, including review of consultants reports, relevant laboratory and other test results, and medications completed. Self-Care Activities - UNABLE to independently check blood sugars as directed by provider, does not have a working meter. States she has told the pharmacy the meter does not work. Will collaborate with pcp and pharm D Attends all scheduled provider appointments Checks blood sugars as prescribed and utilize hyper and hypoglycemia protocol as needed Adheres to prescribed ADA/carb modified Patient Goals: - check blood sugar at prescribed times- has a working meter now. Per Dr. Wynetta Emery she does not have to check blood sugars on a daily basis now.  - check blood sugar before and after exercise - check blood sugar if I feel it is too high or too low - enter blood sugar readings and medication or insulin into daily log - take the blood sugar log to all doctor visits - change to whole grain breads, cereal, pasta - drink 6 to  8 glasses of water each day - eat fish at least once per week - fill half of plate with vegetables - limit fast food meals to no more than 1 per week - manage portion size - prepare main meal at home 3 to 5 days each week - read food labels for fat, fiber, carbohydrates and portion size - reduce red meat to 2 to 3 times a week - switch to low-fat or skim milk - switch to sugar-free drinks - schedule appointment with eye doctor - check feet daily for cuts, sores or redness - keep feet up while sitting - trim toenails straight across - wash and dry feet carefully every day - wear comfortable, cotton socks - wear comfortable, well-fitting shoes - barriers to adherence to treatment plan identified - blood glucose monitoring encouraged - resources required to improve adherence to care identified - self-awareness of signs/symptoms of hypo or hyperglycemia encouraged - use of blood glucose monitoring log promoted Follow Up Plan: Telephone follow up appointment with care management team member scheduled for: 09-07-2020 at 345 pm       Task: RNCM: Alleviate Barriers to Glycemic Management Completed 09/07/2020  Outcome: Positive  Note:  Care Management Activities:    - barriers to adherence to treatment plan identified - blood glucose monitoring encouraged - resources required to improve adherence to care identified - self-awareness of signs/symptoms of hypo or hyperglycemia encouraged - use of blood glucose monitoring log promoted    Notes: Is not currently checking her blood sugars. 09-07-2020: patient no longer has to check her blood sugars or take medications     Care Plan : RNCM: Depression (Adult) and Anxiety  Updates made by Vanita Ingles since 09/07/2020 12:00 AM     Problem: RNCM: Depression Identification (Depression) and Anxiety   Priority: Medium     Long-Range Goal: RNCM: Depressive Symptoms Identified   Start Date: 06/30/2020  Expected End Date: 05/05/2021  This  Visit's Progress: On track  Priority: Medium  Note:   Current Barriers:  Chronic Disease Management support and education needs related to effective management of depression and anxiety  Lacks caregiver support.  Transportation barriers- declined care guide referral  Unable to independently manage exacerbations with depression and anxiety  Lacks social connections Does not maintain contact with provider office Does not contact provider office for questions/concerns  Nurse Case Manager Clinical Goal(s):  patient will verbalize understanding of plan for effective management of depression and anxiety  patient will work with pcp, RNCM and LCSW to address needs related to changes in depression and anxiety  Interventions:  1:1 collaboration with Valerie Roys, DO regarding development and update of comprehensive plan of care as evidenced by provider attestation and co-signature Inter-disciplinary care team collaboration (see longitudinal plan of care) Evaluation of current treatment plan related to depression and anxiety  and patient's adherence to plan as established by provider. 09-07-2020: The patient had an appointment with Dr. Collie Siad and the patient states she is feeling better. She states her depression and anxiety are better than when she was in the office last. She denies any acute distress today. Talked about support from the CCM team. The patient verbalized understanding of support available.  Advised patient to call the office for changes in mood, anxiety, or depression  Provided education to patient re: making follow up appointment to see pcp, taking medicaitons as prescribed, and having a good social support system Reviewed medications with patient and discussed compliance. 09-07-2020: The patient states she is compliant with her medications and treatment regimen.   Patient Goals/Self-Care Activities Over the next 120 days, patient will:  - Patient will self administer medications as  prescribed Patient will attend all scheduled provider appointments Patient will call pharmacy for medication refills Patient will attend church or other social activities Patient will continue to perform ADL's independently Patient will continue to perform IADL's independently Patient will call provider office for new concerns or questions Patient will work with BSW to address care coordination needs and will continue to work with the clinical team to address health care and disease management related needs.   - anxiety screen reviewed - depression screen reviewed - medication list reviewed  Follow Up Plan: Telephone follow up appointment with care management team member scheduled for: 11-09-2020 at 345 pm        Task: RNCM: Identify Depressive Symptoms and Facilitate Treatment Completed 09/07/2020  Outcome: Positive  Note:   Care Management Activities:    - anxiety screen reviewed - depression screen reviewed - medication list reviewed       Plan:Telephone follow up appointment with care management team member scheduled for:  11-09-2020 at 345 pm  [place],  MSN, [facility] Mobile: [phone]

## 2020-09-07 NOTE — Patient Instructions (Signed)
Visit Information  PATIENT GOALS:  Goals Addressed             This Visit's Progress    COMPLETED: RNCM: Monitor and Manage My Blood Sugar-Diabetes Type 2       Timeframe:  Short-Term Goal Priority:  High Start Date:                             Expected End Date:           10-09-2020            Follow Up Date 09-07-2020   - check blood sugar at prescribed times - check blood sugar before and after exercise - check blood sugar if I feel it is too high or too low - enter blood sugar readings and medication or insulin into daily log - take the blood sugar log to all doctor visits  -The patient does not have a working meter- will collaborate with the pcp and pharm D for assistance in getting a working meter   Why is this important?   Checking your blood sugar at home helps to keep it from getting very high or very low.  Writing the results in a diary or log helps the doctor know how to care for you.  Your blood sugar log should have the time, date and the results.  Also, write down the amount of insulin or other medicine that you take.  Other information, like what you ate, exercise done and how you were feeling, will also be helpful.     Notes: Per the patient the meter she received has never worked and she told the pharmacy. Will collaborate with pcp and pharm D for recommendations. 09-07-2020: The patient is not having to take medications for her DM now and Dr. Wynetta Emery advised her that she no longer has to check her blood sugars. Last A1C was 6.2 on 07-29-2020         Patient verbalizes understanding of instructions provided today and agrees to view in [place].   Telephone follow up appointment with care management team member scheduled for:11-09-2020 at 18 pm  Noreene Larsson RN, MSN, [facility] Mobile: [phone]

## 2020-09-08 ENCOUNTER — Other Ambulatory Visit: Payer: Self-pay | Admitting: Family Medicine

## 2020-09-09 ENCOUNTER — Encounter: Payer: Self-pay | Admitting: Family Medicine

## 2020-09-09 DIAGNOSIS — E119 Type 2 diabetes mellitus without complications: Secondary | ICD-10-CM | POA: Diagnosis not present

## 2020-09-09 DIAGNOSIS — Z1389 Encounter for screening for other disorder: Secondary | ICD-10-CM | POA: Diagnosis not present

## 2020-09-09 DIAGNOSIS — E538 Deficiency of other specified B group vitamins: Secondary | ICD-10-CM | POA: Diagnosis not present

## 2020-09-09 DIAGNOSIS — E039 Hypothyroidism, unspecified: Secondary | ICD-10-CM | POA: Diagnosis not present

## 2020-09-09 DIAGNOSIS — R5383 Other fatigue: Secondary | ICD-10-CM | POA: Diagnosis not present

## 2020-09-09 DIAGNOSIS — E559 Vitamin D deficiency, unspecified: Secondary | ICD-10-CM | POA: Diagnosis not present

## 2020-09-09 DIAGNOSIS — G8929 Other chronic pain: Secondary | ICD-10-CM | POA: Diagnosis not present

## 2020-09-09 MED ORDER — THYROID 60 MG PO TABS: 60.0000 mg | ORAL_TABLET | Freq: Every day | ORAL | 1 refills | Status: DC

## 2020-09-09 NOTE — Telephone Encounter (Signed)
Pt is scheduled 8/19

## 2020-09-09 NOTE — Telephone Encounter (Signed)
   Notes to clinic:  Patient would like a 90 day supply    Requested Prescriptions  Pending Prescriptions Disp Refills   ARMOUR THYROID 60 MG tablet [Pharmacy Med Name: ARMOUR THYROID 60 MG TAB] 30 tablet 1    Sig: TAKE 1 TABLET BY MOUTH ONCE DAILY ON AN EMPTY STOMACH. WAIT 30 MINUTES BEFORE TAKING OTHER MEDS.      Endocrinology:  Hypothyroid Agents Failed - 09/08/2020  6:03 PM      Failed - TSH needs to be rechecked within 3 months after an abnormal result. Refill until TSH is due.      Passed - TSH in normal range and within 360 days    TSH  Date Value Ref Range Status  07/29/2020 2.610 0.450 - 4.500 uIU/mL Final          Passed - Valid encounter within last 12 months    Recent Outpatient Visits           1 month ago Type 2 diabetes mellitus with other ophthalmic complication, without long-term current use of insulin ([place])   [place], Megan P, DO   5 months ago Type 2 diabetes mellitus with other ophthalmic complication, without long-term current use of insulin ([place])   [place], Megan P, DO   6 months ago Type 2 diabetes mellitus with other ophthalmic complication, without long-term current use of insulin ([place])   [place], Megan P, DO   8 months ago Pneumonia due to COVID-19 virus   [employer], Megan P, DO   11 months ago Hypothyroidism, unspecified type   [facility] [place], [place], DO       Future Appointments             In 1 month Johnson, Barb Merino, DO [employer], [facility]

## 2020-09-09 NOTE — Telephone Encounter (Signed)
New Rx sent to her pharmacy 

## 2020-09-14 ENCOUNTER — Other Ambulatory Visit: Payer: Medicaid Other

## 2020-09-24 ENCOUNTER — Other Ambulatory Visit: Payer: Medicare HMO

## 2020-09-24 ENCOUNTER — Other Ambulatory Visit: Payer: Self-pay

## 2020-09-24 DIAGNOSIS — E039 Hypothyroidism, unspecified: Secondary | ICD-10-CM

## 2020-09-25 LAB — TSH: TSH: 4.24 u[IU]/mL (ref 0.450–4.500)

## 2020-09-27 ENCOUNTER — Telehealth: Payer: Self-pay | Admitting: Family Medicine

## 2020-09-27 ENCOUNTER — Other Ambulatory Visit: Payer: Self-pay | Admitting: Family Medicine

## 2020-09-27 MED ORDER — THYROID 15 MG PO TABS: 15.0000 mg | ORAL_TABLET | Freq: Every day | ORAL | 1 refills | Status: DC

## 2020-09-27 MED ORDER — THYROID 60 MG PO TABS: 60.0000 mg | ORAL_TABLET | Freq: Every day | ORAL | 1 refills | Status: DC

## 2020-09-27 MED ORDER — THYROID 65 MG PO TABS: 65.0000 mg | ORAL_TABLET | Freq: Every day | ORAL | 1 refills | Status: DC

## 2020-09-27 NOTE — Telephone Encounter (Signed)
Mandy calling from [facility] in [place] regarding thyroid (ARMOUR) 65 MG tablet [[id number]]  this MG does not exist. The MG come in 15,30,60,90MG . But does not come in 65Mg  Please advise [phone]

## 2020-09-27 NOTE — Telephone Encounter (Signed)
Left a message for patient informing her of Dr.Johnson's recommendations. Advised patient to give our office a call back if she has any question or concerns to arise.

## 2020-09-27 NOTE — Telephone Encounter (Signed)
I already sent in a refill on the 60. 90 days not appropriate until we find out if this dose is going to be the right one.

## 2020-09-27 NOTE — Telephone Encounter (Signed)
Spoke patient and she states she is currently taking the 60 mg dose and is requesting a new Rx for a 90-day supply and asking if she you can refill her 60 mg so she will have it to take along with the 15 mg Rx. Please advise?

## 2020-09-27 NOTE — Telephone Encounter (Signed)
This is not high priority.   90 is likely to be too much. I'm going to send her in the 60 and the 15 to be taken together.

## 2020-09-27 NOTE — Telephone Encounter (Signed)
Please advise 

## 2020-10-12 ENCOUNTER — Ambulatory Visit
Payer: Medicare HMO | Attending: Student in an Organized Health Care Education/Training Program | Admitting: Student in an Organized Health Care Education/Training Program

## 2020-10-12 ENCOUNTER — Other Ambulatory Visit: Payer: Self-pay

## 2020-10-12 ENCOUNTER — Encounter: Payer: Self-pay | Admitting: Student in an Organized Health Care Education/Training Program

## 2020-10-12 VITALS — BP 110/60 | HR 90 | Temp 97.3°F | Resp 14 | Ht 61.0 in | Wt 180.0 lb

## 2020-10-12 DIAGNOSIS — G894 Chronic pain syndrome: Secondary | ICD-10-CM | POA: Diagnosis not present

## 2020-10-12 DIAGNOSIS — M26629 Arthralgia of temporomandibular joint, unspecified side: Secondary | ICD-10-CM | POA: Diagnosis not present

## 2020-10-12 DIAGNOSIS — M797 Fibromyalgia: Secondary | ICD-10-CM | POA: Diagnosis not present

## 2020-10-12 DIAGNOSIS — R5382 Chronic fatigue, unspecified: Secondary | ICD-10-CM | POA: Insufficient documentation

## 2020-10-12 DIAGNOSIS — M7918 Myalgia, other site: Secondary | ICD-10-CM | POA: Insufficient documentation

## 2020-10-12 DIAGNOSIS — M47816 Spondylosis without myelopathy or radiculopathy, lumbar region: Secondary | ICD-10-CM | POA: Diagnosis not present

## 2020-10-12 DIAGNOSIS — Z79891 Long term (current) use of opiate analgesic: Secondary | ICD-10-CM | POA: Insufficient documentation

## 2020-10-12 MED ORDER — OXYCODONE-ACETAMINOPHEN 5-325 MG PO TABS: 1.0000 | ORAL_TABLET | Freq: Four times a day (QID) | ORAL | 0 refills | Status: AC | PRN

## 2020-10-12 MED ORDER — OXYCODONE-ACETAMINOPHEN 5-325 MG PO TABS: 1.0000 | ORAL_TABLET | Freq: Four times a day (QID) | ORAL | 0 refills | Status: DC | PRN

## 2020-10-12 MED ORDER — METHOCARBAMOL 500 MG PO TABS: 500.0000 mg | ORAL_TABLET | Freq: Two times a day (BID) | ORAL | 5 refills | Status: DC | PRN

## 2020-10-12 NOTE — Progress Notes (Signed)
PROVIDER NOTE: Information contained herein reflects review and annotations entered in association with encounter. Interpretation of such information and data should be left to medically-trained personnel. Information provided to patient can be located elsewhere in the medical record under "Patient Instructions". Document created using STT-dictation technology, any transcriptional errors that may result from process are unintentional.    Patient: Sharon Rodriguez  Service Category: E/M  Provider: Gillis Santa, MD  DOB: 12-30-1980  DOS: 10/12/2020  Specialty: Interventional Pain Management  MRN: [id number]  Setting: Ambulatory outpatient  PCP: Valerie Roys, DO  Type: Established Patient    Referring Provider: Valerie Roys, DO  Location: Office  Delivery: Face-to-face     HPI  [name], a 40 y.o. year old female, is here today because of her Chronic pain syndrome [G89.4]. [name] primary complain today is Back Pain (lower) and Pelvic Pain Last encounter: My last encounter with her was on 07/06/2020. Pertinent problems: [name] has Hypothyroidism; Rheumatoid arthritis ([place]); Depression; Fibromyalgia; TMJ pain dysfunction syndrome; H/O juvenile rheumatoid arthritis; Spondylosis without myelopathy or radiculopathy, lumbar region; Chronic bilateral low back pain without sciatica; Chronic pain syndrome; and Long term current use of opiate analgesic on their pertinent problem list. Pain Assessment: Severity of Chronic pain is reported as a 8 /10. Location: Back Lower/ . Onset: More than a month ago. Quality: Aching, Throbbing, Sharp. Timing: Intermittent. Modifying factor(s): ice, medications, lying down. Vitals:  height is 5' 1" (1.549 m) and weight is 180 lb (81.6 kg). Her temporal temperature is 97.3 F (36.3 C) (abnormal). Her blood pressure is 110/60 and her pulse is 90. Her respiration is 14 and oxygen saturation is 100%.   Reason for encounter: medication management.    No  change in medical history since last visit.  Patient's pain is at baseline.  Patient continues multimodal pain regimen as prescribed.  States that it provides pain relief and improvement in functional status. Having increased muscle spasms in her pelvic region.  Requesting a muscle relaxer.  Upon chart review, had severe sedation with Flexeril so do not recommend that.  Trial of Robaxin as below. Renewed annual urine toxicology screen for medication compliance monitoring.  Pharmacotherapy Assessment   09/12/2020  07/06/2020   1  Oxycodone-Acetaminophen 5-325  120.00  30  Bi Lat  [id number]  786 ([id number])  0/0  30.00 MME  Comm Ins  [place]       Analgesic: Percocet 5 mg 4 times daily as needed, quantity 120/month; MME equals 30    Monitoring: [place] PMP: PDMP reviewed during this encounter.       Pharmacotherapy: No side-effects or adverse reactions reported. Compliance: No problems identified. Effectiveness: Clinically acceptable.  Landis Martins, RN  10/12/2020 10:29 AM  Sign when Signing Visit Nursing Pain Medication Assessment:  Safety precautions to be maintained throughout the outpatient stay will include: orient to surroundings, keep bed in low position, maintain call bell within reach at all times, provide assistance with transfer out of bed and ambulation.  Medication Inspection Compliance: Pill count conducted under aseptic conditions, in front of the patient. Neither the pills nor the bottle was removed from the patient's sight at any time. Once count was completed pills were immediately returned to the patient in their original bottle.  Medication: Oxycodone/APAP Pill/Patch Count:  4 of 120 pills remain Pill/Patch Appearance: Markings consistent with prescribed medication Bottle Appearance: Standard pharmacy container. Clearly labeled. Filled Date: 07 / 03 / 2022 Last Medication intake:  Today  UDS:  Summary  Date Value Ref Range Status  10/15/2019 Note  Final    Comment:     ==================================================================== [employer] Select 13 (MW) ==================================================================== Test                             Result       Flag       Units  Drug Present and Declared for Prescription Verification   Oxycodone                      871          EXPECTED   ng/mg creat   Oxymorphone                    902          EXPECTED   ng/mg creat   Noroxycodone                   1166         EXPECTED   ng/mg creat   Noroxymorphone                 193          EXPECTED   ng/mg creat    Sources of oxycodone are scheduled prescription medications.    Oxymorphone, noroxycodone, and noroxymorphone are expected    metabolites of oxycodone. Oxymorphone is also available as a    scheduled prescription medication.  Drug Absent but Declared for Prescription Verification   Amphetamine                    Not Detected UNEXPECTED ng/mg creat ==================================================================== Test                      Result    Flag   Units      Ref Range   Creatinine              192              mg/dL      >=20 ==================================================================== Declared Medications:  The flagging and interpretation on this report are based on the  following declared medications.  Unexpected results may arise from  inaccuracies in the declared medications.   **Note: The testing scope of this panel includes these medications:   Amphetamine (Adderall)  Oxycodone (Percocet)   **Note: The testing scope of this panel does not include the  following reported medications:   Acetaminophen (Percocet)  Albuterol (Ventolin HFA)  Levothyroxine (Synthroid)  Metformin (Glucophage)  Naproxen (Naprosyn)  Ondansetron (Zofran)  Vitamin D2 (Drisdol) ==================================================================== For clinical consultation, please call [phone]. ====================================================================      ROS  Constitutional: Denies any fever or chills Gastrointestinal: No reported hemesis, hematochezia, vomiting, or acute GI distress Musculoskeletal:  diffuse MSK pain Neurological: No reported episodes of acute onset apraxia, aphasia, dysarthria, agnosia, amnesia, paralysis, loss of coordination, or loss of consciousness  Medication Review  albuterol, amphetamine-dextroamphetamine, methocarbamol, multivitamin, naproxen, ondansetron, oxyCODONE-acetaminophen, and thyroid  History Review  Allergy: Ms. Gortney is allergic to ibuprofen, metformin and related, and promethazine. Drug: Ms. Duggin  reports no history of drug use. Alcohol:  reports no history of alcohol use. Tobacco:  reports that she has never smoked. She has never used smokeless tobacco. Social: Ms. Janota  reports that she has never smoked. She has never used smokeless tobacco. She reports  that she does not drink alcohol and does not use drugs. Medical:  has a past medical history of ADD (attention deficit disorder), Anxiety, Asthma, Biliary dyskinesia (12/27/2017), Chronic bronchitis ([place]), Chronic fatigue, Chronic pain, Depression, Diabetes mellitus without complication ([place]), Fibromyalgia, GERD (gastroesophageal reflux disease), History of stomach ulcers (2019), Hypothyroidism (01/27/2015), Learning disability, Osteoarthritis, Rheumatoid arthritis ([place]), Thyroid disease, and TMJ (dislocation of temporomandibular joint). Surgical: Ms. Sampedro  has a past surgical history that includes Cesarean section; Esophagogastroduodenoscopy (egd) with propofol (N/A, 12/07/2017); and Cholecystectomy (N/A, 01/14/2018). Family: family history includes ADD / ADHD in her daughter and mother; Allergies in her daughter and daughter; Arthritis in her brother and paternal grandmother; COPD in her mother; Cancer in her maternal grandmother; Dementia in her paternal grandmother;  Diabetes in her father and mother; Heart attack in her maternal grandfather; Heart disease in her maternal grandfather; Hyperlipidemia in her father; Learning disabilities in her mother; Mental illness in her daughter, father, and sister; Stroke in her maternal grandfather; Thyroid disease in her daughter.  Laboratory Chemistry Profile   Renal Lab Results  Component Value Date   BUN 13 07/29/2020   CREATININE 0.95 07/29/2020   BCR 14 07/29/2020   GFRAA 94 04/16/2020   GFRNONAA 82 04/16/2020     Hepatic Lab Results  Component Value Date   AST 27 07/29/2020   ALT 17 07/29/2020   ALBUMIN 4.4 07/29/2020   ALKPHOS 83 07/29/2020   LIPASE 43 04/10/2017     Electrolytes Lab Results  Component Value Date   NA 139 07/29/2020   K 4.3 07/29/2020   CL 100 07/29/2020   CALCIUM 9.5 07/29/2020   MG 2.2 07/29/2020     Bone Lab Results  Component Value Date   VD25OH 31.1 07/29/2020     Inflammation (CRP: Acute Phase) (ESR: Chronic Phase) No results found for: CRP, ESRSEDRATE, LATICACIDVEN     Note: Above Lab results reviewed.  Recent Imaging Review  US BREAST LTD UNI LEFT INC AXILLA CLINICAL DATA:  Patient describes palpable lump within the outer breast. Patient also describes diffuse bilateral breast pain. This is patient's first mammogram.  EXAM: DIGITAL DIAGNOSTIC BILATERAL MAMMOGRAM WITH TOMOSYNTHESIS AND CAD; ULTRASOUND LEFT BREAST LIMITED  TECHNIQUE: Bilateral digital diagnostic mammography and breast tomosynthesis was performed. The images were evaluated with computer-aided detection.; Targeted ultrasound examination of the left breast was performed  COMPARISON:  None.  ACR Breast Density Category b: There are scattered areas of fibroglandular density.  FINDINGS: Bilateral diagnostic mammogram: There are no dominant masses, suspicious calcifications or secondary signs of malignancy within either breast. Specifically, there is no mammographic abnormality within  the outer LEFT breast corresponding to the area of clinical concern, with overlying skin marker in place.  Targeted ultrasound is performed, evaluating the upper-outer quadrant of the LEFT breast with particular attention to the 2 o'clock axis as directed by the patient, showing only normal fibroglandular tissues and fat lobules throughout. No solid or cystic mass.  IMPRESSION: No evidence of malignancy within either breast.  RECOMMENDATION: 1.  Screening mammogram in one year.(Code:SM-B-01Y) 2. The patient was instructed to return sooner if the area that she feels becomes larger and/or firmer to palpation, or if a new palpable abnormality is identified in either breast.  I have discussed the findings and recommendations with the patient. If applicable, a reminder letter will be sent to the patient regarding the next appointment.  BI-RADS CATEGORY  1: Negative.  Electronically Signed   By: Franki Cabot M.D.   On:  05/24/2020 11:19 MM DIAG BREAST TOMO BILATERAL CLINICAL DATA:  Patient describes palpable lump within the outer breast. Patient also describes diffuse bilateral breast pain. This is patient's first mammogram.  EXAM: DIGITAL DIAGNOSTIC BILATERAL MAMMOGRAM WITH TOMOSYNTHESIS AND CAD; ULTRASOUND LEFT BREAST LIMITED  TECHNIQUE: Bilateral digital diagnostic mammography and breast tomosynthesis was performed. The images were evaluated with computer-aided detection.; Targeted ultrasound examination of the left breast was performed  COMPARISON:  None.  ACR Breast Density Category b: There are scattered areas of fibroglandular density.  FINDINGS: Bilateral diagnostic mammogram: There are no dominant masses, suspicious calcifications or secondary signs of malignancy within either breast. Specifically, there is no mammographic abnormality within the outer LEFT breast corresponding to the area of clinical concern, with overlying skin marker in place.  Targeted  ultrasound is performed, evaluating the upper-outer quadrant of the LEFT breast with particular attention to the 2 o'clock axis as directed by the patient, showing only normal fibroglandular tissues and fat lobules throughout. No solid or cystic mass.  IMPRESSION: No evidence of malignancy within either breast.  RECOMMENDATION: 1.  Screening mammogram in one year.(Code:SM-B-01Y) 2. The patient was instructed to return sooner if the area that she feels becomes larger and/or firmer to palpation, or if a new palpable abnormality is identified in either breast.  I have discussed the findings and recommendations with the patient. If applicable, a reminder letter will be sent to the patient regarding the next appointment.  BI-RADS CATEGORY  1: Negative.  Electronically Signed   By: Franki Cabot M.D.   On: 05/24/2020 11:19 Note: Reviewed        Physical Exam  General appearance: Well nourished, well developed, and well hydrated. In no apparent acute distress Mental status: Alert, oriented x 3 (person, place, & time)       Respiratory: No evidence of acute respiratory distress Eyes: PERLA Vitals: BP 110/60   Pulse 90   Temp (!) 97.3 F (36.3 C) (Temporal)   Resp 14   Ht 5' 1" (1.549 m)   Wt 180 lb (81.6 kg)   SpO2 100%   BMI 34.01 kg/m  BMI: Estimated body mass index is 34.01 kg/m as calculated from the following:   Height as of this encounter: 5' 1" (1.549 m).   Weight as of this encounter: 180 lb (81.6 kg). Ideal: Ideal body weight: 47.8 kg (105 lb 6.1 oz) Adjusted ideal body weight: 61.3 kg (135 lb 3.6 oz)    Lumbar Spine Area Exam  Skin & Axial Inspection: No masses, redness, or swelling Alignment: Symmetrical Functional ROM: Decreased ROM       Stability: No instability detected Muscle Tone/Strength: Functionally intact. No obvious neuro-muscular anomalies detected. Sensory (Neurological): Articular pain pattern Palpation: Complains of area being tender to  palpation Bilateral Fist Percussion Test Provocative Tests: Lumbar Hyperextension and rotation test: Positive bilaterally for facet joint pain.                  Gait & Posture Assessment  Ambulation: Unassisted Gait: Relatively normal for age and body habitus Posture: WNL    Lower Extremity Exam      Side: Right lower extremity   Side: Left lower extremity  Stability: No instability observed           Stability: No instability observed          Skin & Extremity Inspection: Skin color, temperature, and hair growth are WNL. No peripheral edema or cyanosis. No masses, redness, swelling, asymmetry, or associated skin  lesions. No contractures.   Skin & Extremity Inspection: Skin color, temperature, and hair growth are WNL. No peripheral edema or cyanosis. No masses, redness, swelling, asymmetry, or associated skin lesions. No contractures.  Functional ROM: Unrestricted ROM                   Functional ROM: Unrestricted ROM                  Muscle Tone/Strength: Functionally intact. No obvious neuro-muscular anomalies detected.   Muscle Tone/Strength: Functionally intact. No obvious neuro-muscular anomalies detected.  Sensory (Neurological): Unimpaired   Sensory (Neurological): Unimpaired  Palpation: No palpable anomalies   Palpation: No palpable anomalies      Assessment   Status Diagnosis  Controlled Controlled Controlled 1. Chronic pain syndrome   2. Spondylosis without myelopathy or radiculopathy, lumbar region   3. Long term current use of opiate analgesic   4. Fibromyalgia   5. TMJ pain dysfunction syndrome   6. Myofascial pain   7. Chronic fatigue       Plan of Care  Ms. MELLISA ARSHAD has a current medication list which includes the following long-term medication(s): albuterol, albuterol, thyroid, and thyroid.  Pharmacotherapy (Medications Ordered): Meds ordered this encounter  Medications   oxyCODONE-acetaminophen (PERCOCET) 5-325 MG tablet    Sig: Take 1 tablet by  mouth every 6 (six) hours as needed for severe pain. Must last 30 days.    Dispense:  120 tablet    Refill:  0    [place] STOP ACT - Not applicable. Fill one day early if pharmacy is closed on scheduled refill date.   oxyCODONE-acetaminophen (PERCOCET) 5-325 MG tablet    Sig: Take 1 tablet by mouth every 6 (six) hours as needed for severe pain. Must last 30 days.    Dispense:  120 tablet    Refill:  0    [place] STOP ACT - Not applicable. Fill one day early if pharmacy is closed on scheduled refill date.   oxyCODONE-acetaminophen (PERCOCET) 5-325 MG tablet    Sig: Take 1 tablet by mouth every 6 (six) hours as needed for severe pain. Must last 30 days.    Dispense:  120 tablet    Refill:  0    [place] STOP ACT - Not applicable. Fill one day early if pharmacy is closed on scheduled refill date.   methocarbamol (ROBAXIN) 500 MG tablet    Sig: Take 1 tablet (500 mg total) by mouth 2 (two) times daily as needed for muscle spasms.    Dispense:  60 tablet    Refill:  5    Do not place this medication, or any other prescription from our practice, on "Automatic Refill". Patient may have prescription filled one day early if pharmacy is closed on scheduled refill date.    Follow-up plan:   Return in about 3 months (around 01/06/2021) for Medication Management, in person.   Recent Visits No visits were found meeting these conditions. Showing recent visits within past 90 days and meeting all other requirements Today's Visits Date Type Provider Dept  10/12/20 Office Visit Gillis Santa, MD [facility]  Showing today's visits and meeting all other requirements Future Appointments No visits were found meeting these conditions. Showing future appointments within next 90 days and meeting all other requirements I discussed the assessment and treatment plan with the patient. The patient was provided an opportunity to ask questions and all were answered. The patient agreed with the plan and demonstrated  an  understanding of the instructions.  Patient advised to call back or seek an in-person evaluation if the symptoms or condition worsens.  Duration of encounter: 30 minutes.  Note by: Gillis Santa, MD Date: 10/12/2020; Time: 10:45 AM

## 2020-10-12 NOTE — Progress Notes (Signed)
Nursing Pain Medication Assessment:  Safety precautions to be maintained throughout the outpatient stay will include: orient to surroundings, keep bed in low position, maintain call bell within reach at all times, provide assistance with transfer out of bed and ambulation.  Medication Inspection Compliance: Pill count conducted under aseptic conditions, in front of the patient. Neither the pills nor the bottle was removed from the patient's sight at any time. Once count was completed pills were immediately returned to the patient in their original bottle.  Medication: Oxycodone/APAP Pill/Patch Count:  4 of 120 pills remain Pill/Patch Appearance: Markings consistent with prescribed medication Bottle Appearance: Standard pharmacy container. Clearly labeled. Filled Date: 07 / 03 / 2022 Last Medication intake:  Today

## 2020-10-15 LAB — TOXASSURE SELECT 13 (MW), URINE

## 2020-10-29 ENCOUNTER — Ambulatory Visit (INDEPENDENT_AMBULATORY_CARE_PROVIDER_SITE_OTHER): Payer: Medicare HMO | Admitting: Family Medicine

## 2020-10-29 ENCOUNTER — Encounter: Payer: Self-pay | Admitting: Family Medicine

## 2020-10-29 ENCOUNTER — Other Ambulatory Visit: Payer: Self-pay

## 2020-10-29 VITALS — BP 98/62 | Temp 98.9°F | Ht 60.98 in | Wt 182.1 lb

## 2020-10-29 DIAGNOSIS — E039 Hypothyroidism, unspecified: Secondary | ICD-10-CM | POA: Diagnosis not present

## 2020-10-29 DIAGNOSIS — E1139 Type 2 diabetes mellitus with other diabetic ophthalmic complication: Secondary | ICD-10-CM

## 2020-10-29 LAB — BAYER DCA HB A1C WAIVED: HB A1C (BAYER DCA - WAIVED): 6.4 % (ref <7.0)

## 2020-10-29 MED ORDER — ONDANSETRON 8 MG PO TBDP: 8.0000 mg | ORAL_TABLET | Freq: Three times a day (TID) | ORAL | 2 refills | Status: DC | PRN

## 2020-10-29 MED ORDER — NAPROXEN 500 MG PO TABS: 500.0000 mg | ORAL_TABLET | Freq: Two times a day (BID) | ORAL | 2 refills | Status: DC

## 2020-10-29 NOTE — Progress Notes (Signed)
BP 98/62   Temp 98.9 F (37.2 C) (Oral)   Ht 5' 0.98" (1.549 m)   Wt 182 lb 1.6 oz (82.6 kg)   SpO2 97%   BMI 34.43 kg/m    Subjective:    Patient ID: Sharon Rodriguez, female    DOB: 1980/08/18, 40 y.o.   MRN: [id number]  HPI: Sharon Rodriguez is a 40 y.o. female  Chief Complaint  Patient presents with   Diabetes   Hypothyroidism   HYPOTHYROIDISM Thyroid control status:better Satisfied with current treatment? yes Medication side effects: no Medication compliance: excellent compliance Etiology of hypothyroidism:  Recent dose adjustment:yes Fatigue: yes Cold intolerance: no Heat intolerance: no Weight gain: no Weight loss: no Constipation: no Diarrhea/loose stools: no Palpitations: no Lower extremity edema: no Anxiety/depressed mood: no  DIABETES Hypoglycemic episodes:no Polydipsia/polyuria: no Visual disturbance: no Chest pain: no Paresthesias: no Glucose Monitoring: no  Accucheck frequency: Not Checking Taking Insulin?: no Blood Pressure Monitoring: not checking Retinal Examination: Up to Date Foot Exam: Up to Date Diabetic Education: Completed Pneumovax: Up to Date Influenza: Up to Date Aspirin: no   Relevant past medical, surgical, family and social history reviewed and updated as indicated. Interim medical history since our last visit reviewed. Allergies and medications reviewed and updated.  Review of Systems  Constitutional: Negative.   Respiratory: Negative.    Cardiovascular: Negative.   Gastrointestinal: Negative.   Musculoskeletal: Negative.   Neurological: Negative.   Psychiatric/Behavioral: Negative.     Per HPI unless specifically indicated above     Objective:    BP 98/62   Temp 98.9 F (37.2 C) (Oral)   Ht 5' 0.98" (1.549 m)   Wt 182 lb 1.6 oz (82.6 kg)   SpO2 97%   BMI 34.43 kg/m   Wt Readings from Last 3 Encounters:  10/29/20 182 lb 1.6 oz (82.6 kg)  10/12/20 180 lb (81.6 kg)  07/29/20 170 lb (77.1 kg)     Physical Exam Vitals and nursing note reviewed.  Constitutional:      General: She is not in acute distress.    Appearance: Normal appearance. She is not ill-appearing, toxic-appearing or diaphoretic.  HENT:     Head: Normocephalic and atraumatic.     Right Ear: External ear normal.     Left Ear: External ear normal.     Nose: Nose normal.     Mouth/Throat:     Mouth: Mucous membranes are moist.     Pharynx: Oropharynx is clear.  Eyes:     General: No scleral icterus.       Right eye: No discharge.        Left eye: No discharge.     Extraocular Movements: Extraocular movements intact.     Conjunctiva/sclera: Conjunctivae normal.     Pupils: Pupils are equal, round, and reactive to light.  Cardiovascular:     Rate and Rhythm: Normal rate and regular rhythm.     Pulses: Normal pulses.     Heart sounds: Normal heart sounds. No murmur heard.   No friction rub. No gallop.  Pulmonary:     Effort: Pulmonary effort is normal. No respiratory distress.     Breath sounds: Normal breath sounds. No stridor. No wheezing, rhonchi or rales.  Chest:     Chest wall: No tenderness.  Musculoskeletal:        General: Normal range of motion.     Cervical back: Normal range of motion and neck supple.  Skin:    General:  Skin is warm and dry.     Capillary Refill: Capillary refill takes less than 2 seconds.     Coloration: Skin is not jaundiced or pale.     Findings: No bruising, erythema, lesion or rash.  Neurological:     General: No focal deficit present.     Mental Status: She is alert and oriented to person, place, and time. Mental status is at baseline.  Psychiatric:        Mood and Affect: Mood normal.        Behavior: Behavior normal.        Thought Content: Thought content normal.        Judgment: Judgment normal.    Results for orders placed or performed in visit on 10/12/20  Tox[employer]SURE Select 13 (MW), Urine  Result Value Ref Range   Summary Note       Assessment & Plan:    Problem List Items Addressed This Visit       Endocrine   Hypothyroidism    Rechecking labs today. Await results. Treat as needed.       Relevant Orders   TSH   Diabetes mellitus ([place]) - Primary    Doing well with A1c of 6.4- continue current regimen. Call with any concerns.       Relevant Orders   [facility] DCA Hb A1c Waived     Follow up plan: Return ASAP 40 min for OMT eval, rgular follow up 6 months physical.

## 2020-10-29 NOTE — Assessment & Plan Note (Signed)
Rechecking labs today. Await results. Treat as needed.  °

## 2020-10-29 NOTE — Assessment & Plan Note (Signed)
Doing well with A1c of 6.4- continue current regimen. Call with any concerns.

## 2020-10-30 ENCOUNTER — Other Ambulatory Visit: Payer: Self-pay | Admitting: Family Medicine

## 2020-10-30 LAB — TSH: TSH: 1.62 u[IU]/mL (ref 0.450–4.500)

## 2020-10-30 MED ORDER — THYROID 60 MG PO TABS: 60.0000 mg | ORAL_TABLET | Freq: Every day | ORAL | 3 refills | Status: DC

## 2020-10-30 MED ORDER — THYROID 15 MG PO TABS: 15.0000 mg | ORAL_TABLET | Freq: Every day | ORAL | 3 refills | Status: DC

## 2020-11-09 ENCOUNTER — Telehealth: Payer: Medicaid Other

## 2020-11-14 ENCOUNTER — Emergency Department: Payer: Medicare HMO

## 2020-11-14 ENCOUNTER — Other Ambulatory Visit: Payer: Self-pay

## 2020-11-14 ENCOUNTER — Emergency Department
Admission: EM | Admit: 2020-11-14 | Discharge: 2020-11-14 | Disposition: A | Discharge status: Home / Self Care | Payer: Medicare HMO | Attending: Student in an Organized Health Care Education/Training Program | Admitting: Student in an Organized Health Care Education/Training Program

## 2020-11-14 DIAGNOSIS — M545 Low back pain, unspecified: Secondary | ICD-10-CM | POA: Insufficient documentation

## 2020-11-14 DIAGNOSIS — M79643 Pain in unspecified hand: Secondary | ICD-10-CM | POA: Diagnosis not present

## 2020-11-14 DIAGNOSIS — Z743 Need for continuous supervision: Secondary | ICD-10-CM | POA: Diagnosis not present

## 2020-11-14 DIAGNOSIS — J45909 Unspecified asthma, uncomplicated: Secondary | ICD-10-CM | POA: Diagnosis not present

## 2020-11-14 DIAGNOSIS — R0789 Other chest pain: Secondary | ICD-10-CM | POA: Diagnosis not present

## 2020-11-14 DIAGNOSIS — M79671 Pain in right foot: Secondary | ICD-10-CM | POA: Insufficient documentation

## 2020-11-14 DIAGNOSIS — R200 Anesthesia of skin: Secondary | ICD-10-CM | POA: Insufficient documentation

## 2020-11-14 DIAGNOSIS — M79641 Pain in right hand: Secondary | ICD-10-CM | POA: Insufficient documentation

## 2020-11-14 DIAGNOSIS — R079 Chest pain, unspecified: Secondary | ICD-10-CM | POA: Diagnosis not present

## 2020-11-14 DIAGNOSIS — S99921A Unspecified injury of right foot, initial encounter: Secondary | ICD-10-CM | POA: Diagnosis not present

## 2020-11-14 DIAGNOSIS — E119 Type 2 diabetes mellitus without complications: Secondary | ICD-10-CM | POA: Insufficient documentation

## 2020-11-14 DIAGNOSIS — M542 Cervicalgia: Secondary | ICD-10-CM | POA: Diagnosis not present

## 2020-11-14 DIAGNOSIS — M79673 Pain in unspecified foot: Secondary | ICD-10-CM | POA: Diagnosis not present

## 2020-11-14 DIAGNOSIS — R52 Pain, unspecified: Secondary | ICD-10-CM | POA: Diagnosis not present

## 2020-11-14 DIAGNOSIS — E039 Hypothyroidism, unspecified: Secondary | ICD-10-CM | POA: Diagnosis not present

## 2020-11-14 DIAGNOSIS — M25571 Pain in right ankle and joints of right foot: Secondary | ICD-10-CM | POA: Diagnosis not present

## 2020-11-14 DIAGNOSIS — Y92410 Unspecified street and highway as the place of occurrence of the external cause: Secondary | ICD-10-CM | POA: Insufficient documentation

## 2020-11-14 DIAGNOSIS — Z8616 Personal history of COVID-19: Secondary | ICD-10-CM | POA: Diagnosis not present

## 2020-11-14 DIAGNOSIS — Z79899 Other long term (current) drug therapy: Secondary | ICD-10-CM | POA: Insufficient documentation

## 2020-11-14 DIAGNOSIS — S6991XA Unspecified injury of right wrist, hand and finger(s), initial encounter: Secondary | ICD-10-CM | POA: Diagnosis not present

## 2020-11-14 LAB — POC URINE PREG, ED: Preg Test, Ur: NEGATIVE

## 2020-11-14 MED ORDER — ACETAMINOPHEN 325 MG PO TABS
650.0000 mg | ORAL_TABLET | Freq: Once | ORAL | Status: DC
  Filled 2020-11-14: qty 2

## 2020-11-14 NOTE — Discharge Instructions (Addendum)
Please follow-up with orthopedics as needed.
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
## 2020-11-14 NOTE — ED Notes (Signed)
This RN requested pt provide a urine sample for a pregnancy test before having imaging per CT/ Xray tech. Pt states "I don't have to go, there is no way I can be pregnant anyways". Will notify CT/Xray tech.

## 2020-11-14 NOTE — ED Notes (Signed)
Pt refused tylenol and states, "that's not going to do anything for me so I dont want it, I just took my percocet instead". Attending provider aware.

## 2020-11-18 ENCOUNTER — Encounter: Payer: Self-pay | Admitting: Family Medicine

## 2020-11-18 ENCOUNTER — Other Ambulatory Visit: Payer: Self-pay

## 2020-11-18 ENCOUNTER — Ambulatory Visit (INDEPENDENT_AMBULATORY_CARE_PROVIDER_SITE_OTHER): Payer: Medicare HMO | Admitting: Family Medicine

## 2020-11-18 VITALS — BP 127/78 | HR 89 | Temp 98.3°F | Ht 60.98 in | Wt 182.4 lb

## 2020-11-18 DIAGNOSIS — S134XXA Sprain of ligaments of cervical spine, initial encounter: Secondary | ICD-10-CM | POA: Diagnosis not present

## 2020-11-18 DIAGNOSIS — M542 Cervicalgia: Secondary | ICD-10-CM

## 2020-11-18 DIAGNOSIS — T300 Burn of unspecified body region, unspecified degree: Secondary | ICD-10-CM

## 2020-11-18 MED ORDER — SILVER SULFADIAZINE 1 % EX CREA: 1.0000 "application " | TOPICAL_CREAM | Freq: Every day | CUTANEOUS | 0 refills | Status: DC

## 2020-11-18 MED ORDER — EPINEPHRINE 0.3 MG/0.3ML IJ SOAJ: 0.3000 mg | INTRAMUSCULAR | 12 refills | Status: DC | PRN
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
## 2020-11-23 DIAGNOSIS — M25571 Pain in right ankle and joints of right foot: Secondary | ICD-10-CM | POA: Diagnosis not present

## 2020-11-25 DIAGNOSIS — Z79899 Other long term (current) drug therapy: Secondary | ICD-10-CM | POA: Diagnosis not present

## 2020-11-25 DIAGNOSIS — F411 Generalized anxiety disorder: Secondary | ICD-10-CM | POA: Diagnosis not present

## 2020-11-25 DIAGNOSIS — F900 Attention-deficit hyperactivity disorder, predominantly inattentive type: Secondary | ICD-10-CM | POA: Diagnosis not present

## 2020-11-25 DIAGNOSIS — F331 Major depressive disorder, recurrent, moderate: Secondary | ICD-10-CM | POA: Diagnosis not present

## 2020-11-25 DIAGNOSIS — R69 Illness, unspecified: Secondary | ICD-10-CM | POA: Diagnosis not present

## 2020-11-29 DIAGNOSIS — S20219A Contusion of unspecified front wall of thorax, initial encounter: Secondary | ICD-10-CM | POA: Diagnosis not present

## 2020-11-29 DIAGNOSIS — E039 Hypothyroidism, unspecified: Secondary | ICD-10-CM | POA: Diagnosis not present

## 2020-11-29 DIAGNOSIS — Z0000 Encounter for general adult medical examination without abnormal findings: Secondary | ICD-10-CM | POA: Diagnosis not present

## 2020-11-29 DIAGNOSIS — S40021A Contusion of right upper arm, initial encounter: Secondary | ICD-10-CM | POA: Diagnosis not present

## 2020-11-29 DIAGNOSIS — E119 Type 2 diabetes mellitus without complications: Secondary | ICD-10-CM | POA: Diagnosis not present

## 2020-12-06 ENCOUNTER — Other Ambulatory Visit: Payer: Self-pay

## 2020-12-06 ENCOUNTER — Ambulatory Visit (INDEPENDENT_AMBULATORY_CARE_PROVIDER_SITE_OTHER): Payer: Medicare HMO | Admitting: Family Medicine

## 2020-12-06 VITALS — Ht 60.0 in | Wt 182.0 lb

## 2020-12-06 DIAGNOSIS — M9902 Segmental and somatic dysfunction of thoracic region: Secondary | ICD-10-CM | POA: Diagnosis not present

## 2020-12-06 DIAGNOSIS — M9903 Segmental and somatic dysfunction of lumbar region: Secondary | ICD-10-CM

## 2020-12-06 DIAGNOSIS — M9904 Segmental and somatic dysfunction of sacral region: Secondary | ICD-10-CM | POA: Diagnosis not present

## 2020-12-06 DIAGNOSIS — M9907 Segmental and somatic dysfunction of upper extremity: Secondary | ICD-10-CM

## 2020-12-06 DIAGNOSIS — M9909 Segmental and somatic dysfunction of abdomen and other regions: Secondary | ICD-10-CM

## 2020-12-06 DIAGNOSIS — M9900 Segmental and somatic dysfunction of head region: Secondary | ICD-10-CM | POA: Diagnosis not present

## 2020-12-06 DIAGNOSIS — S134XXA Sprain of ligaments of cervical spine, initial encounter: Secondary | ICD-10-CM

## 2020-12-06 DIAGNOSIS — M9901 Segmental and somatic dysfunction of cervical region: Secondary | ICD-10-CM | POA: Diagnosis not present

## 2020-12-06 DIAGNOSIS — M9908 Segmental and somatic dysfunction of rib cage: Secondary | ICD-10-CM

## 2020-12-06 DIAGNOSIS — M9905 Segmental and somatic dysfunction of pelvic region: Secondary | ICD-10-CM

## 2020-12-06 DIAGNOSIS — M542 Cervicalgia: Secondary | ICD-10-CM
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
## 2020-12-10 ENCOUNTER — Encounter: Payer: Self-pay | Admitting: Family Medicine

## 2020-12-10 DIAGNOSIS — S46011A Strain of muscle(s) and tendon(s) of the rotator cuff of right shoulder, initial encounter: Secondary | ICD-10-CM | POA: Diagnosis not present

## 2020-12-24 ENCOUNTER — Other Ambulatory Visit: Payer: Self-pay

## 2020-12-24 ENCOUNTER — Ambulatory Visit (INDEPENDENT_AMBULATORY_CARE_PROVIDER_SITE_OTHER): Payer: Medicare HMO | Admitting: Family Medicine

## 2020-12-24 VITALS — BP 104/74 | HR 106 | Ht 60.0 in | Wt 180.0 lb

## 2020-12-24 DIAGNOSIS — M9902 Segmental and somatic dysfunction of thoracic region: Secondary | ICD-10-CM

## 2020-12-24 DIAGNOSIS — M542 Cervicalgia: Secondary | ICD-10-CM | POA: Diagnosis not present

## 2020-12-24 DIAGNOSIS — M9903 Segmental and somatic dysfunction of lumbar region: Secondary | ICD-10-CM

## 2020-12-24 DIAGNOSIS — M9908 Segmental and somatic dysfunction of rib cage: Secondary | ICD-10-CM | POA: Diagnosis not present

## 2020-12-24 DIAGNOSIS — M9905 Segmental and somatic dysfunction of pelvic region: Secondary | ICD-10-CM | POA: Diagnosis not present

## 2020-12-24 DIAGNOSIS — M9900 Segmental and somatic dysfunction of head region: Secondary | ICD-10-CM | POA: Diagnosis not present

## 2020-12-24 DIAGNOSIS — M9901 Segmental and somatic dysfunction of cervical region: Secondary | ICD-10-CM | POA: Diagnosis not present

## 2020-12-24 DIAGNOSIS — M9904 Segmental and somatic dysfunction of sacral region: Secondary | ICD-10-CM | POA: Diagnosis not present
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
## 2020-12-27 DIAGNOSIS — M79671 Pain in right foot: Secondary | ICD-10-CM | POA: Diagnosis not present

## 2020-12-27 DIAGNOSIS — M25571 Pain in right ankle and joints of right foot: Secondary | ICD-10-CM | POA: Diagnosis not present

## 2020-12-28 ENCOUNTER — Encounter: Payer: Self-pay | Admitting: Family Medicine

## 2020-12-30 ENCOUNTER — Ambulatory Visit
Payer: Medicare HMO | Attending: Student in an Organized Health Care Education/Training Program | Admitting: Student in an Organized Health Care Education/Training Program

## 2020-12-30 ENCOUNTER — Encounter: Payer: Self-pay | Admitting: Student in an Organized Health Care Education/Training Program

## 2020-12-30 ENCOUNTER — Other Ambulatory Visit: Payer: Self-pay

## 2020-12-30 VITALS — BP 110/71 | HR 102 | Temp 97.2°F | Resp 16 | Ht 61.0 in | Wt 180.0 lb

## 2020-12-30 DIAGNOSIS — M7918 Myalgia, other site: Secondary | ICD-10-CM

## 2020-12-30 DIAGNOSIS — G894 Chronic pain syndrome: Secondary | ICD-10-CM | POA: Diagnosis not present

## 2020-12-30 DIAGNOSIS — M47816 Spondylosis without myelopathy or radiculopathy, lumbar region: Secondary | ICD-10-CM | POA: Diagnosis not present

## 2020-12-30 DIAGNOSIS — M26629 Arthralgia of temporomandibular joint, unspecified side: Secondary | ICD-10-CM

## 2020-12-30 DIAGNOSIS — M797 Fibromyalgia: Secondary | ICD-10-CM

## 2020-12-30 DIAGNOSIS — Z79891 Long term (current) use of opiate analgesic: Secondary | ICD-10-CM | POA: Diagnosis not present

## 2020-12-30 MED ORDER — METHOCARBAMOL 500 MG PO TABS: 500.0000 mg | ORAL_TABLET | Freq: Two times a day (BID) | ORAL | 5 refills | Status: DC | PRN

## 2020-12-30 MED ORDER — OXYCODONE-ACETAMINOPHEN 5-325 MG PO TABS: 1.0000 | ORAL_TABLET | Freq: Four times a day (QID) | ORAL | 0 refills | Status: AC | PRN

## 2020-12-30 MED ORDER — OXYCODONE-ACETAMINOPHEN 5-325 MG PO TABS: 1.0000 | ORAL_TABLET | Freq: Four times a day (QID) | ORAL | 0 refills | Status: DC | PRN

## 2020-12-30 MED ORDER — DULOXETINE HCL 20 MG PO CPEP: ORAL_CAPSULE | ORAL | 0 refills | Status: DC

## 2020-12-30 NOTE — Progress Notes (Signed)
Safety precautions to be maintained throughout the outpatient stay will include: orient to surroundings, keep bed in low position, maintain call bell within reach at all times, provide assistance with transfer out of bed and ambulation.  Nursing Pain Medication Assessment:  Safety precautions to be maintained throughout the outpatient stay will include: orient to surroundings, keep bed in low position, maintain call bell within reach at all times, provide assistance with transfer out of bed and ambulation.  Medication Inspection Compliance: Pill count conducted under aseptic conditions, in front of the patient. Neither the pills nor the bottle was removed from the patient's sight at any time. Once count was completed pills were immediately returned to the patient in their original bottle.  Medication: Hydrocodone/APAP Pill/Patch Count:  45 of 120 pills remain Pill/Patch Appearance: Markings consistent with prescribed medication Bottle Appearance: Standard pharmacy container. Clearly labeled. Filled Date: 92 / 1 / 2022 Last Medication intake:  Today Safety precautions to be maintained throughout the outpatient stay will include: orient to surroundings, keep bed in low position, maintain call bell within reach at all times, provide assistance with transfer out of bed and ambulation.
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
## 2021-01-03 NOTE — Telephone Encounter (Signed)
Patient decided on OCP's

## 2021-01-04 DIAGNOSIS — S46011A Strain of muscle(s) and tendon(s) of the rotator cuff of right shoulder, initial encounter: Secondary | ICD-10-CM | POA: Diagnosis not present

## 2021-01-06 ENCOUNTER — Encounter: Payer: Medicare HMO | Admitting: Student in an Organized Health Care Education/Training Program

## 2021-01-06 ENCOUNTER — Encounter: Payer: Self-pay | Admitting: Family Medicine

## 2021-01-07 ENCOUNTER — Encounter: Payer: Self-pay | Admitting: Family Medicine

## 2021-01-07 DIAGNOSIS — M7531 Calcific tendinitis of right shoulder: Secondary | ICD-10-CM | POA: Diagnosis not present

## 2021-01-07 NOTE — Telephone Encounter (Signed)
Provided information to patient

## 2021-01-12 DIAGNOSIS — M25611 Stiffness of right shoulder, not elsewhere classified: Secondary | ICD-10-CM | POA: Diagnosis not present

## 2021-01-12 DIAGNOSIS — M25511 Pain in right shoulder: Secondary | ICD-10-CM | POA: Diagnosis not present

## 2021-01-20 ENCOUNTER — Ambulatory Visit (INDEPENDENT_AMBULATORY_CARE_PROVIDER_SITE_OTHER): Payer: Medicare HMO | Admitting: Family Medicine

## 2021-01-20 DIAGNOSIS — Z0000 Encounter for general adult medical examination without abnormal findings: Secondary | ICD-10-CM | POA: Diagnosis not present

## 2021-01-20 NOTE — Progress Notes (Signed)
Subjective:   Sharon Rodriguez is a 40 y.o. female who presents for Medicare Annual (Subsequent) preventive examination.  I connected with  Sharon Rodriguez on 01/20/21 by a telephone enabled telemedicine application and verified that I am speaking with the correct person using two identifiers.   I discussed the limitations of evaluation and management by telemedicine. The patient expressed understanding and agreed to proceed.    Review of Systems     Cardiac Risk Factors include: diabetes mellitus;advanced age (>15men, >71 women);obesity (BMI >30kg/m2);Other (see comment) (back pain)     Objective:    Today's Vitals   01/20/21 1305  PainSc: 5    There is no height or weight on file to calculate BMI.  Advanced Directives 01/20/2021 11/14/2020 10/12/2020 01/14/2018 01/01/2018 01/01/2018 11/29/2017  Does Patient Have a Medical Advance Directive? No No No No No No No  Would patient like information on creating a medical advance directive? No - Patient declined - - No - Patient declined - No - Patient declined Yes (MAU/Ambulatory/Procedural Areas - Information given)    Current Medications (verified) Outpatient Encounter Medications as of 01/20/2021  Medication Sig   albuterol (PROVENTIL HFA;VENTOLIN HFA) 108 (90 Base) MCG/ACT inhaler Inhale 1 puff into the lungs every 6 (six) hours as needed for wheezing or shortness of breath.   amphetamine-dextroamphetamine (ADDERALL) 30 MG tablet Take 1 tablet by mouth 2 (two) times daily.   DULoxetine (CYMBALTA) 20 MG capsule Take 1 capsule (20 mg total) by mouth daily for 30 days, THEN 2 capsules (40 mg total) daily.   EPINEPHrine (EPIPEN 2-PAK) 0.3 mg/0.3 mL IJ SOAJ injection Inject 0.3 mg into the muscle as needed for anaphylaxis (Pateint is a Doctor, general practice.).   methocarbamol (ROBAXIN) 500 MG tablet Take 1 tablet (500 mg total) by mouth 2 (two) times daily as needed for muscle spasms.   Multiple Vitamin (MULTIVITAMIN) tablet Take 1 tablet by mouth  daily.   naproxen (NAPROSYN) 500 MG tablet Take 1 tablet (500 mg total) by mouth 2 (two) times daily with a meal.   ondansetron (ZOFRAN ODT) 8 MG disintegrating tablet Take 1 tablet (8 mg total) by mouth every 8 (eight) hours as needed for nausea or vomiting.   oxyCODONE-acetaminophen (PERCOCET) 5-325 MG tablet Take 1 tablet by mouth every 6 (six) hours as needed for severe pain. Must last 30 days.   [START ON 02/09/2021] oxyCODONE-acetaminophen (PERCOCET) 5-325 MG tablet Take 1 tablet by mouth every 6 (six) hours as needed for severe pain. Must last 30 days.   [START ON 03/11/2021] oxyCODONE-acetaminophen (PERCOCET) 5-325 MG tablet Take 1 tablet by mouth every 6 (six) hours as needed for severe pain. Must last 30 days.   thyroid (ARMOUR THYROID) 15 MG tablet Take 1 tablet (15 mg total) by mouth daily. To be taken with 60 mg for total of 75mg .   thyroid (ARMOUR THYROID) 60 MG tablet Take 1 tablet (60 mg total) by mouth daily before breakfast. To be taken with 15 mg for total of 75mg .   No facility-administered encounter medications on file as of 01/20/2021.    Allergies (verified) Ibuprofen, Metformin and related, and Promethazine   History: Past Medical History:  Diagnosis Date   ADD (attention deficit disorder)    Anxiety    Asthma    WELL CONTROLLED   Biliary dyskinesia 12/27/2017   Chronic bronchitis (HCC)    Chronic fatigue    Chronic pain    Depression    Diabetes mellitus without complication ([place])  Fibromyalgia    GERD (gastroesophageal reflux disease)    History of stomach ulcers 2019   Hypothyroidism 01/27/2015   Learning disability    Osteoarthritis    Rheumatoid arthritis ([place])    Thyroid disease    TMJ (dislocation of temporomandibular joint)    Past Surgical History:  Procedure Laterality Date   CESAREAN SECTION     x 2   CHOLECYSTECTOMY N/A 01/14/2018   Procedure: LAPAROSCOPIC CHOLECYSTECTOMY;  Surgeon: Vickie Epley, MD;  Location: [facility] ORS;  Service:  General;  Laterality: N/A;   ESOPHAGOGASTRODUODENOSCOPY (EGD) WITH PROPOFOL N/A 12/07/2017   Procedure: ESOPHAGOGASTRODUODENOSCOPY (EGD) WITH PROPOFOL;  Surgeon: Jonathon Bellows, MD;  Location[facility] END[facility];  Service: Gastroenterology;  Laterality: N/A;   Family History  Problem Relation Age of Onset   Diabetes Mother    ADD / ADHD Mother    Learning disabilities Mother    COPD Mother    Diabetes Father    Hyperlipidemia Father    Mental illness Father        Depression/anxiety   Mental illness Sister        Depression/Anxiety   Arthritis Brother        Rheumatoid   Mental illness Daughter        anxiety   Thyroid disease Daughter    ADD / ADHD Daughter    Allergies Daughter    Cancer Maternal Grandmother        Skin, Lung   Heart disease Maternal Grandfather    Stroke Maternal Grandfather    Heart attack Maternal Grandfather    Arthritis Paternal Grandmother        Rheumatoid   Dementia Paternal Grandmother    Allergies Daughter    Social History   Socioeconomic History   Marital status: Married    Spouse name: Not on file   Number of children: Not on file   Years of education: Not on file   Highest education level: Not on file  Occupational History   Not on file  Tobacco Use   Smoking status: Never   Smokeless tobacco: Never  Vaping Use   Vaping Use: Never used  Substance and Sexual Activity   Alcohol use: No   Drug use: No   Sexual activity: Not Currently    Birth control/protection: None  Other Topics Concern   Not on file  Social History Narrative   Not on file   Social Determinants of Health   Financial Resource Strain: Low Risk    Difficulty of Paying Living Expenses: Not hard at all  Food Insecurity: No Food Insecurity   Worried About Charity fundraiser in the Last Year: Never true   [place] in the Last Year: Never true  Transportation Needs: No Transportation Needs   Lack of Transportation (Medical): No   Lack of Transportation  (Non-Medical): No  Physical Activity: Insufficiently Active   Days of Exercise per Week: 2 days   Minutes of Exercise per Session: 20 min  Stress: Stress Concern Present   Feeling of Stress : To some extent  Social Connections: Unknown   Frequency of Communication with Friends and Family: Twice a week   Frequency of Social Gatherings with Friends and Family: Once a week   Attends Religious Services: More than 4 times per year   Active Member of [employer] or Organizations: No   Attends Archivist Meetings: Never   Marital Status: Not on file    Tobacco Counseling Counseling given:  Not Answered   Clinical Intake:  Pre-visit preparation completed: Yes  Pain : 0-10 Pain Score: 5  Pain Type: Chronic pain Pain Location: Back Pain Descriptors / Indicators: Burning, Aching, Constant Pain Onset: More than a month ago Pain Frequency: Constant Pain Relieving Factors: oxycodone,rest, heat ice Effect of Pain on Daily Activities: yes  Pain Relieving Factors: oxycodone,rest, heat ice  Nutritional Status: BMI > 30  Obese Nutritional Risks: None Diabetes: Yes CBG done?: No Did pt. bring in CBG monitor from home?: No  How often do you need to have someone help you when you read instructions, pamphlets, or other written materials from your doctor or pharmacy?: 1 - Never  Diabetic?  Yes  Nutrition Risk Assessment:  Has the patient had any N/V/D within the last 2 months?  No  Does the patient have any non-healing wounds?  No  Has the patient had any unintentional weight loss or weight gain?  No   Diabetes:  Is the patient diabetic?  Yes  If diabetic, was a CBG obtained today?  No  Did the patient bring in their glucometer from home?  No  How often do you monitor your CBG's? Does not.   Financial Strains and Diabetes Management:  Are you having any financial strains with the device, your supplies or your medication? No .  Does the patient want to be seen by Chronic Care  Management for management of their diabetes?  No  Would the patient like to be referred to a Nutritionist or for Diabetic Management?  No   Diabetic Exams:  Diabetic Eye Exam: . Overdue for diabetic eye exam. Pt has been advised about the importance in completing this exam.   Diabetic Foot Exam:. Pt has been advised about the importance in completing this exam.  Interpreter Needed?: No  Information entered by :: Leroy Kennedy LPN   Activities of Daily Living In your present state of health, do you have any difficulty performing the following activities: 01/20/2021  Hearing? N  Vision? N  Difficulty concentrating or making decisions? N  Walking or climbing stairs? Y  Dressing or bathing? N  Doing errands, shopping? N  Preparing Food and eating ? N  Using the Toilet? N  In the past six months, have you accidently leaked urine? N  Do you have problems with loss of bowel control? Y  Managing your Medications? N  Managing your Finances? N  Housekeeping or managing your Housekeeping? Y  Some recent data might be hidden    Patient Care Team: Valerie Roys, DO as PCP - General (Family Medicine) Lennox Laity, CRNP as Nurse Practitioner (Rheumatology) Myer Haff, MD as Referring Physician (Psychiatry) Vanita Ingles, RN as Registered Nurse ([place]) Greg Cutter, LCSW as [place] Management (Licensed Clinical Social Worker)  Indicate any recent [place] you may have received from other than [facility] providers in the past year (date may be approximate).     Assessment:   This is a routine wellness examination for Sharon Rodriguez.  Hearing/Vision screen Hearing Screening - Comments:: No trouble hearing  Vision Screening - Comments:: Not up to date  Dietary issues and exercise activities discussed: Current Exercise Habits: Home exercise routine, Type of exercise: stretching, Time (Minutes): 20, Frequency (Times/Week): 3, Weekly Exercise  (Minutes/Week): 60, Intensity: Mild   Goals Addressed   None    Depression Screen PHQ 2/9 Scores 01/20/2021 10/29/2020 10/12/2020 07/29/2020 01/15/2020 01/01/2020 10/15/2019  PHQ - 2 Score 5 0 0 4 0  6 0  PHQ- 9 Score 23 5 - 17 - 24 -    Fall Risk Fall Risk  01/20/2021 12/30/2020 11/18/2020 10/29/2020 10/12/2020  Falls in the past year? 0 0 0 0 0  Number falls in past yr: 0 - 0 0 -  Injury with Fall? 0 - 0 0 -  Risk Factor Category  - - - - -  Risk for fall due to : - - No Fall Risks No Fall Risks -  Risk for fall due to: Comment - - - - -  Follow up Falls evaluation completed;Education provided;Falls prevention discussed - Falls evaluation completed Falls evaluation completed -    FALL RISK PREVENTION PERTAINING TO THE HOME:  Any stairs in or around the home? No  If so, are there any without handrails? No  Home free of loose throw rugs in walkways, pet beds, electrical cords, etc? Yes  Adequate lighting in your home to reduce risk of falls? Yes   ASSISTIVE DEVICES UTILIZED TO PREVENT FALLS:  Life alert? No  Use of a cane, walker or w/c? No  Grab bars in the bathroom? No  Shower chair or bench in shower? Yes  Elevated toilet seat or a handicapped toilet? Yes   TIMED UP AND GO:  Was the test performed? No .    Cognitive Function:  Normal cognitive status assessed by direct observation by this Nurse Health Advisor. No abnormalities found.       6CIT Screen 06/25/2017  What Year? 0 points  What month? 0 points  What time? 0 points  Count back from 20 0 points  Months in reverse 0 points  Repeat phrase 2 points  Total Score 2    Immunizations Immunization History  Administered Date(s) Administered   Influenza,inj,Quad PF,6+ Mos 01/05/2016, 12/05/2016, 01/24/2018, 01/01/2020   Influenza-Unspecified 01/05/2016   Pneumococcal Polysaccharide-23 01/01/2020   Tdap 01/05/2016    TDAP status: Up to date  Flu Vaccine status: Due, Education has been provided regarding the  importance of this vaccine. Advised may receive this vaccine at local pharmacy or Health Dept. Aware to provide a copy of the vaccination record if obtained from local pharmacy or Health Dept. Verbalized acceptance and understanding.  Pneumococcal vaccine status: Up to date  Covid-19 vaccine status: Information provided on how to obtain vaccines.   Qualifies for Shingles Vaccine? No   Zostavax completed No   Shingrix Completed?: No.    Education has been provided regarding the importance of this vaccine. Patient has been advised to call insurance company to determine out of pocket expense if they have not yet received this vaccine. Advised may also receive vaccine at local pharmacy or Health Dept. Verbalized acceptance and understanding.  Screening Tests Health Maintenance  Topic Date Due   COVID-19 Vaccine (1) Never done   OPHTHALMOLOGY EXAM  Never done   FOOT EXAM  10/05/2020   Pneumococcal Vaccine 59-63 Years old (2 - PCV) 12/31/2020   INFLUENZA VACCINE  06/10/2021 (Originally 10/11/2020)   HEMOGLOBIN A1C  05/01/2021   URINE MICROALBUMIN  07/29/2021   PAP SMEAR-Modifier  03/28/2022   TETANUS/TDAP  01/04/2026   Hepatitis C Screening  Completed   HIV Screening  Addressed   HPV VACCINES  Aged Out    Health Maintenance  Health Maintenance Due  Topic Date Due   COVID-19 Vaccine (1) Never done   OPHTHALMOLOGY EXAM  Never done   FOOT EXAM  10/05/2020   Pneumococcal Vaccine 24-11 Years old (2 - PCV) 12/31/2020  Colonoscopy  not indicated  Mammogram status: Completed  . Repeat every year    Lung Cancer Screening: (Low Dose CT Chest recommended if Age 20-80 years, 30 pack-year currently smoking OR have quit w/in 15years.) does not qualify.   Lung Cancer Screening Referral:   Additional Screening:  Hepatitis C Screening: does not qualify;   Vision Screening: Recommended annual ophthalmology exams for early detection of glaucoma and other disorders of the eye. Is the  patient up to date with their annual eye exam?  No  Who is the provider or what is the name of the office in which the patient attends annual eye exams?  If pt is not established with a provider, would they like to be referred to a provider to establish care? No .   Dental Screening: Recommended annual dental exams for proper oral hygiene  Community Resource Referral / Chronic Care Management: CRR required this visit?  No   CCM required this visit?  No      Plan:     I have personally reviewed and noted the following in the patient's chart:   Medical and social history Use of alcohol, tobacco or illicit drugs  Current medications and supplements including opioid prescriptions.  Functional ability and status Nutritional status Physical activity Advanced directives List of other physicians Hospitalizations, surgeries, and ER visits in previous 12 months Vitals Screenings to include cognitive, depression, and falls Referrals and appointments  In addition, I have reviewed and discussed with patient certain preventive protocols, quality metrics, and best practice recommendations. A written personalized care plan for preventive services as well as general preventive health recommendations were provided to patient.     Leroy Kennedy, LPN   20/80/2233   Nurse Notes:

## 2021-01-20 NOTE — Patient Instructions (Signed)
[name] , Thank you for taking time to come for your Medicare Wellness Visit. I appreciate your ongoing commitment to your health goals. Please review the following plan we discussed and let me know if I can assist you in the future.   Screening recommendations/referrals:  Mammogram: up to date  Recommended yearly ophthalmology/optometry visit for glaucoma screening and checkup Recommended yearly dental visit for hygiene and checkup  Vaccinations: Influenza vaccine: Education provided Pneumococcal vaccine: Education provided Tdap vaccine: up to date     Advanced directives: Education provided  Conditions/risks identified:   Next appointment: [facility]2021 [facility]  Female Preventive care refers to lifestyle choices and visits with your health care provider that can promote health and wellness. What does preventive care include? A yearly physical exam. This is also called an annual well check. Dental exams once or twice a year. Routine eye exams. Ask your health care provider how often you should have your eyes checked. Personal lifestyle choices, including: Daily care of your teeth and gums. Regular physical activity. Eating a healthy diet. Avoiding tobacco and drug use. Limiting alcohol use. Practicing safe sex. Taking low-dose aspirin every day. Taking vitamin and mineral supplements as recommended by your health care provider. What happens during an annual well check? The services and screenings done by your health care provider during your annual well check will depend on your age, overall health, lifestyle risk factors, and family history of disease. Counseling  Your health care provider may ask you questions about your: Alcohol use. Tobacco use. Drug use. Emotional well-being. Home and relationship well-being. Sexual activity. Eating habits. History of falls. Memory and ability to understand (cognition). Work and work Statistician. Reproductive  health. Screening  You may have the following tests or measurements: Height, weight, and BMI. Blood pressure. Lipid and cholesterol levels. These may be checked every 5 years, or more frequently if you are over 10 years old. Skin check. Lung cancer screening. You may have this screening every year starting at age 3 if you have a 30-pack-year history of smoking and currently smoke or have quit within the past 15 years. Fecal occult blood test (FOBT) of the stool. You may have this test every year starting at age 10. Flexible sigmoidoscopy or colonoscopy. You may have a sigmoidoscopy every 5 years or a colonoscopy every 10 years starting at age 26. Hepatitis C blood test. Hepatitis B blood test. Sexually transmitted disease (STD) testing. Diabetes screening. This is done by checking your blood sugar (glucose) after you have not eaten for a while (fasting). You may have this done every 1-3 years. Bone density scan. This is done to screen for osteoporosis. You may have this done starting at age 18. Mammogram. This may be done every 1-2 years. Talk to your health care provider about how often you should have regular mammograms. Talk with your health care provider about your test results, treatment options, and if necessary, the need for more tests. Vaccines  Your health care provider may recommend certain vaccines, such as: Influenza vaccine. This is recommended every year. Tetanus, diphtheria, and acellular pertussis (Tdap, Td) vaccine. You may need a Td booster every 10 years. Zoster vaccine. You may need this after age 49. Pneumococcal 13-valent conjugate (PCV13) vaccine. One dose is recommended after age 74. Pneumococcal polysaccharide (PPSV23) vaccine. One dose is recommended after age 47. Talk to your health care provider about which screenings and vaccines you need and how often you need them. This information is not  intended to replace advice given to you by your health care provider.  Make sure you discuss any questions you have with your health care provider. Document Released: 03/26/2015 Document Revised: 11/17/2015 Document Reviewed: 12/29/2014 Elsevier Interactive Patient Education  2017 [place] Prevention in the Home Falls can cause injuries. They can happen to people of all ages. There are many things you can do to make your home safe and to help prevent falls. What can I do on the outside of my home? Regularly fix the edges of walkways and driveways and fix any cracks. Remove anything that might make you trip as you walk through a door, such as a raised step or threshold. Trim any bushes or trees on the path to your home. Use bright outdoor lighting. Clear any walking paths of anything that might make someone trip, such as rocks or tools. Regularly check to see if handrails are loose or broken. Make sure that both sides of any steps have handrails. Any raised decks and porches should have guardrails on the edges. Have any leaves, snow, or ice cleared regularly. Use sand or salt on walking paths during winter. Clean up any spills in your garage right away. This includes oil or grease spills. What can I do in the bathroom? Use night lights. Install grab bars by the toilet and in the tub and shower. Do not use towel bars as grab bars. Use non-skid mats or decals in the tub or shower. If you need to sit down in the shower, use a plastic, non-slip stool. Keep the floor dry. Clean up any water that spills on the floor as soon as it happens. Remove soap buildup in the tub or shower regularly. Attach bath mats securely with double-sided non-slip rug tape. Do not have throw rugs and other things on the floor that can make you trip. What can I do in the bedroom? Use night lights. Make sure that you have a light by your bed that is easy to reach. Do not use any sheets or blankets that are too big for your bed. They should not hang down onto the floor. Have a  firm chair that has side arms. You can use this for support while you get dressed. Do not have throw rugs and other things on the floor that can make you trip. What can I do in the kitchen? Clean up any spills right away. Avoid walking on wet floors. Keep items that you use a lot in easy-to-reach places. If you need to reach something above you, use a strong step stool that has a grab bar. Keep electrical cords out of the way. Do not use floor polish or wax that makes floors slippery. If you must use wax, use non-skid floor wax. Do not have throw rugs and other things on the floor that can make you trip. What can I do with my stairs? Do not leave any items on the stairs. Make sure that there are handrails on both sides of the stairs and use them. Fix handrails that are broken or loose. Make sure that handrails are as long as the stairways. Check any carpeting to make sure that it is firmly attached to the stairs. Fix any carpet that is loose or worn. Avoid having throw rugs at the top or bottom of the stairs. If you do have throw rugs, attach them to the floor with carpet tape. Make sure that you have a light switch at the top of the stairs  and the bottom of the stairs. If you do not have them, ask someone to add them for you. What else can I do to help prevent falls? Wear shoes that: Do not have high heels. Have rubber bottoms. Are comfortable and fit you well. Are closed at the toe. Do not wear sandals. If you use a stepladder: Make sure that it is fully opened. Do not climb a closed stepladder. Make sure that both sides of the stepladder are locked into place. Ask someone to hold it for you, if possible. Clearly mark and make sure that you can see: Any grab bars or handrails. First and last steps. Where the edge of each step is. Use tools that help you move around (mobility aids) if they are needed. These include: Canes. Walkers. Scooters. Crutches. Turn on the lights when you  go into a dark area. Replace any light bulbs as soon as they burn out. Set up your furniture so you have a clear path. Avoid moving your furniture around. If any of your floors are uneven, fix them. If there are any pets around you, be aware of where they are. Review your medicines with your doctor. Some medicines can make you feel dizzy. This can increase your chance of falling. Ask your doctor what other things that you can do to help prevent falls. This information is not intended to replace advice given to you by your health care provider. Make sure you discuss any questions you have with your health care provider. Document Released: 12/24/2008 Document Revised: 08/05/2015 Document Reviewed: 04/03/2014 Elsevier Interactive Patient Education  2017 [employer].

## 2021-01-24 ENCOUNTER — Encounter: Payer: Self-pay | Admitting: Family Medicine

## 2021-01-24 ENCOUNTER — Other Ambulatory Visit: Payer: Self-pay

## 2021-01-24 ENCOUNTER — Ambulatory Visit (INDEPENDENT_AMBULATORY_CARE_PROVIDER_SITE_OTHER): Payer: Medicare HMO | Admitting: Family Medicine

## 2021-01-24 VITALS — BP 103/67 | HR 103 | Wt 183.8 lb

## 2021-01-24 DIAGNOSIS — M9901 Segmental and somatic dysfunction of cervical region: Secondary | ICD-10-CM

## 2021-01-24 DIAGNOSIS — M542 Cervicalgia: Secondary | ICD-10-CM

## 2021-01-24 DIAGNOSIS — E1139 Type 2 diabetes mellitus with other diabetic ophthalmic complication: Secondary | ICD-10-CM

## 2021-01-24 DIAGNOSIS — E039 Hypothyroidism, unspecified: Secondary | ICD-10-CM

## 2021-01-24 DIAGNOSIS — M9909 Segmental and somatic dysfunction of abdomen and other regions: Secondary | ICD-10-CM | POA: Diagnosis not present

## 2021-01-24 DIAGNOSIS — M9900 Segmental and somatic dysfunction of head region: Secondary | ICD-10-CM

## 2021-01-24 DIAGNOSIS — M9902 Segmental and somatic dysfunction of thoracic region: Secondary | ICD-10-CM

## 2021-01-24 DIAGNOSIS — M9908 Segmental and somatic dysfunction of rib cage: Secondary | ICD-10-CM | POA: Diagnosis not present

## 2021-01-24 LAB — BAYER DCA HB A1C WAIVED: HB A1C (BAYER DCA - WAIVED): 7.1 % — ABNORMAL HIGH (ref 4.8–5.6)

## 2021-01-24 MED ORDER — OZEMPIC (0.25 OR 0.5 MG/DOSE) 2 MG/1.5ML ~~LOC~~ SOPN: PEN_INJECTOR | SUBCUTANEOUS | 1 refills | Status: DC

## 2021-01-24 MED ORDER — PEN NEEDLES 32G X 5 MM MISC: 1.0000 | 3 refills | Status: AC

## 2021-01-24 NOTE — Assessment & Plan Note (Signed)
Would like to recheck her labs today. Ordered. Call with any concerns.

## 2021-01-24 NOTE — Progress Notes (Signed)
I connected with  Sharon Rodriguez on 01-20-2021 by a telephone enabled telemedicine application and verified that I am speaking with the correct person using two identifiers.   I discussed the limitations of evaluation and management by telemedicine. The patient expressed understanding and agreed to proceed.  Patient location: home  Provider location: tele-health   not in office

## 2021-01-24 NOTE — Assessment & Plan Note (Addendum)
Up slightly with A1c of 7.1. Will start ozempic- significant GI upset with metformin and jardiance. Recheck 3 months. Call with any concerns.
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
## 2021-01-25 LAB — TSH: TSH: 2.34 u[IU]/mL (ref 0.450–4.500)

## 2021-02-08 DIAGNOSIS — M25511 Pain in right shoulder: Secondary | ICD-10-CM | POA: Diagnosis not present

## 2021-02-08 DIAGNOSIS — M25611 Stiffness of right shoulder, not elsewhere classified: Secondary | ICD-10-CM | POA: Diagnosis not present

## 2021-02-14 ENCOUNTER — Telehealth: Payer: Self-pay

## 2021-02-14 DIAGNOSIS — M25511 Pain in right shoulder: Secondary | ICD-10-CM | POA: Diagnosis not present

## 2021-02-14 DIAGNOSIS — M25611 Stiffness of right shoulder, not elsewhere classified: Secondary | ICD-10-CM | POA: Diagnosis not present

## 2021-02-14 NOTE — Telephone Encounter (Signed)
LVM for patient to return call about her diabetic eye exam.

## 2021-02-15 DIAGNOSIS — S46011A Strain of muscle(s) and tendon(s) of the rotator cuff of right shoulder, initial encounter: Secondary | ICD-10-CM | POA: Diagnosis not present

## 2021-02-15 DIAGNOSIS — M2550 Pain in unspecified joint: Secondary | ICD-10-CM | POA: Diagnosis not present

## 2021-02-17 ENCOUNTER — Other Ambulatory Visit: Payer: Self-pay | Admitting: Family Medicine

## 2021-02-17 DIAGNOSIS — R69 Illness, unspecified: Secondary | ICD-10-CM | POA: Diagnosis not present

## 2021-02-17 DIAGNOSIS — F900 Attention-deficit hyperactivity disorder, predominantly inattentive type: Secondary | ICD-10-CM | POA: Diagnosis not present

## 2021-02-17 NOTE — Telephone Encounter (Signed)
Requested medication (s) are due for refill today: NO  Requested medication (s) are on the active medication list: yes  Last refill:  01/24/21  Future visit scheduled: 04/26/21  Notes to clinic:  If using correct dose then pt should not be requesting refill yet, please assess.  Requested Prescriptions  Pending Prescriptions Disp Refills   OZEMPIC, 0.25 OR 0.5 MG/DOSE, 2 MG/1.5ML SOPN [Pharmacy Med Name: OZEMPIC (0.25 OR 0.5 MG/DOSE) 2 MG/] 1.5 mL     Sig: INJECT 0.25MLS (0.25MG  TOTAL) SUBCUTANEOUSLY ONCE A WEEK FOR 4 WEEKS THEN 0.5 MG ONCE A WEEK     Endocrinology:  Diabetes - GLP-1 Receptor Agonists Passed - 02/17/2021  1:19 PM      Passed - HBA1C is between 0 and 7.9 and within 180 days    Hemoglobin A1C  Date Value Ref Range Status  07/08/2019 7.6  Final   HB A1C (BAYER DCA - WAIVED)  Date Value Ref Range Status  01/24/2021 7.1 (H) 4.8 - 5.6 % Final    Comment:             Prediabetes: 5.7 - 6.4          Diabetes: >6.4          Glycemic control for adults with diabetes: <7.0           Passed - Valid encounter within last 6 months    Recent Outpatient Visits           3 weeks ago Type 2 diabetes mellitus with other ophthalmic complication, without long-term current use of insulin ([place])   [place], Megan P, DO   4 weeks ago 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, DO   1 month ago Neck pain   [place], DO   2 months ago Neck pain   [place], DO   3 months ago Neck pain   [facility] [place], [place], DO

## 2021-02-18 DIAGNOSIS — M545 Low back pain, unspecified: Secondary | ICD-10-CM | POA: Diagnosis not present

## 2021-03-09 ENCOUNTER — Encounter: Payer: Self-pay | Admitting: Family Medicine

## 2021-03-22 DIAGNOSIS — M545 Low back pain, unspecified: Secondary | ICD-10-CM | POA: Diagnosis not present

## 2021-03-23 ENCOUNTER — Other Ambulatory Visit: Payer: Self-pay | Admitting: Family Medicine

## 2021-03-23 ENCOUNTER — Encounter: Payer: Self-pay | Admitting: Family Medicine

## 2021-03-23 DIAGNOSIS — G8929 Other chronic pain: Secondary | ICD-10-CM

## 2021-03-23 DIAGNOSIS — R102 Pelvic and perineal pain: Secondary | ICD-10-CM

## 2021-03-23 DIAGNOSIS — M19042 Primary osteoarthritis, left hand: Secondary | ICD-10-CM | POA: Diagnosis not present

## 2021-03-23 DIAGNOSIS — M19041 Primary osteoarthritis, right hand: Secondary | ICD-10-CM | POA: Diagnosis not present

## 2021-03-23 DIAGNOSIS — M2550 Pain in unspecified joint: Secondary | ICD-10-CM | POA: Diagnosis not present

## 2021-03-23 DIAGNOSIS — Z8739 Personal history of other diseases of the musculoskeletal system and connective tissue: Secondary | ICD-10-CM | POA: Diagnosis not present

## 2021-03-23 DIAGNOSIS — M85872 Other specified disorders of bone density and structure, left ankle and foot: Secondary | ICD-10-CM | POA: Diagnosis not present

## 2021-03-23 DIAGNOSIS — M797 Fibromyalgia: Secondary | ICD-10-CM | POA: Diagnosis not present

## 2021-03-23 NOTE — Telephone Encounter (Signed)
Requested Prescriptions  Pending Prescriptions Disp Refills   OZEMPIC, 0.25 OR 0.5 MG/DOSE, 2 MG/1.5ML SOPN [Pharmacy Med Name: OZEMPIC (0.25 OR 0.5 MG/DOSE) 2 MG/] 1.5 mL 0    Sig: INJECT 0.25MLS (0.25MG  TOTAL) SUBCUTANEOUSLY ONCE A WEEK FOR 4 WEEKS THEN 0.5 MG ONCE A WEEK     Endocrinology:  Diabetes - GLP-1 Receptor Agonists Passed - 03/23/2021 10:31 AM      Passed - HBA1C is between 0 and 7.9 and within 180 days    Hemoglobin A1C  Date Value Ref Range Status  07/08/2019 7.6  Final   HB A1C (BAYER DCA - WAIVED)  Date Value Ref Range Status  01/24/2021 7.1 (H) 4.8 - 5.6 % Final    Comment:             Prediabetes: 5.7 - 6.4          Diabetes: >6.4          Glycemic control for adults with diabetes: <7.0          Passed - Valid encounter within last 6 months    Recent Outpatient Visits          1 month ago Type 2 diabetes mellitus with other ophthalmic complication, without long-term current use of insulin ([place])   [place], Megan P, DO   2 months ago 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

## 2021-04-05 ENCOUNTER — Other Ambulatory Visit: Payer: Self-pay

## 2021-04-05 ENCOUNTER — Ambulatory Visit
Payer: Medicare HMO | Attending: Student in an Organized Health Care Education/Training Program | Admitting: Student in an Organized Health Care Education/Training Program

## 2021-04-05 ENCOUNTER — Encounter: Payer: Self-pay | Admitting: Student in an Organized Health Care Education/Training Program

## 2021-04-05 DIAGNOSIS — Z79891 Long term (current) use of opiate analgesic: Secondary | ICD-10-CM | POA: Insufficient documentation

## 2021-04-05 DIAGNOSIS — G894 Chronic pain syndrome: Secondary | ICD-10-CM | POA: Insufficient documentation

## 2021-04-05 DIAGNOSIS — M47816 Spondylosis without myelopathy or radiculopathy, lumbar region: Secondary | ICD-10-CM | POA: Diagnosis not present

## 2021-04-05 DIAGNOSIS — M7918 Myalgia, other site: Secondary | ICD-10-CM | POA: Insufficient documentation

## 2021-04-05 DIAGNOSIS — M26629 Arthralgia of temporomandibular joint, unspecified side: Secondary | ICD-10-CM | POA: Diagnosis not present

## 2021-04-05 DIAGNOSIS — R5382 Chronic fatigue, unspecified: Secondary | ICD-10-CM | POA: Insufficient documentation

## 2021-04-05 DIAGNOSIS — M797 Fibromyalgia: Secondary | ICD-10-CM | POA: Insufficient documentation

## 2021-04-05 MED ORDER — OXYCODONE-ACETAMINOPHEN 5-325 MG PO TABS: 1.0000 | ORAL_TABLET | Freq: Four times a day (QID) | ORAL | 0 refills | Status: DC | PRN

## 2021-04-05 MED ORDER — OXYCODONE-ACETAMINOPHEN 5-325 MG PO TABS: 1.0000 | ORAL_TABLET | Freq: Four times a day (QID) | ORAL | 0 refills | Status: AC | PRN
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
## 2021-04-05 NOTE — Progress Notes (Signed)
Nursing Pain Medication Assessment:  Safety precautions to be maintained throughout the outpatient stay will include: orient to surroundings, keep bed in low position, maintain call bell within reach at all times, provide assistance with transfer out of bed and ambulation.  Medication Inspection Compliance: Pill count conducted under aseptic conditions, in front of the patient. Neither the pills nor the bottle was removed from the patient's sight at any time. Once count was completed pills were immediately returned to the patient in their original bottle.  Medication: Oxycodone/APAP Pill/Patch Count:  22 of 120 pills remain Pill/Patch Appearance: Markings consistent with prescribed medication Bottle Appearance: Standard pharmacy container. Clearly labeled. Filled Date: 108 / 30 / 2022 Last Medication intake:  Today

## 2021-04-06 ENCOUNTER — Telehealth: Payer: Self-pay | Admitting: Student in an Organized Health Care Education/Training Program

## 2021-04-06 NOTE — Telephone Encounter (Signed)
Patient called to find out from Dr Holley Raring (he asked to call ) if it is ok for her to start Methotrexate 10mg  once weekly? Please advise patient

## 2021-04-06 NOTE — Telephone Encounter (Signed)
Patient notified

## 2021-04-06 NOTE — Telephone Encounter (Signed)
Dr Holley Raring, do you have any issues with her taking Methotrexate weekly?

## 2021-04-08 DIAGNOSIS — M25551 Pain in right hip: Secondary | ICD-10-CM | POA: Diagnosis not present

## 2021-04-08 DIAGNOSIS — M25552 Pain in left hip: Secondary | ICD-10-CM | POA: Diagnosis not present

## 2021-04-14 ENCOUNTER — Other Ambulatory Visit: Payer: Self-pay | Admitting: Rheumatology

## 2021-04-14 DIAGNOSIS — Z8739 Personal history of other diseases of the musculoskeletal system and connective tissue: Secondary | ICD-10-CM

## 2021-04-14 DIAGNOSIS — M2550 Pain in unspecified joint: Secondary | ICD-10-CM

## 2021-04-14 DIAGNOSIS — M064 Inflammatory polyarthropathy: Secondary | ICD-10-CM

## 2021-04-26 ENCOUNTER — Other Ambulatory Visit: Payer: Self-pay

## 2021-04-26 ENCOUNTER — Ambulatory Visit (INDEPENDENT_AMBULATORY_CARE_PROVIDER_SITE_OTHER): Payer: Medicare HMO | Admitting: Family Medicine

## 2021-04-26 ENCOUNTER — Encounter: Payer: Self-pay | Admitting: Family Medicine

## 2021-04-26 VITALS — BP 96/66 | HR 101 | Wt 176.6 lb

## 2021-04-26 DIAGNOSIS — M9900 Segmental and somatic dysfunction of head region: Secondary | ICD-10-CM | POA: Diagnosis not present

## 2021-04-26 DIAGNOSIS — E1139 Type 2 diabetes mellitus with other diabetic ophthalmic complication: Secondary | ICD-10-CM

## 2021-04-26 DIAGNOSIS — M069 Rheumatoid arthritis, unspecified: Secondary | ICD-10-CM | POA: Diagnosis not present

## 2021-04-26 DIAGNOSIS — M9901 Segmental and somatic dysfunction of cervical region: Secondary | ICD-10-CM

## 2021-04-26 DIAGNOSIS — M9908 Segmental and somatic dysfunction of rib cage: Secondary | ICD-10-CM | POA: Diagnosis not present

## 2021-04-26 DIAGNOSIS — M9905 Segmental and somatic dysfunction of pelvic region: Secondary | ICD-10-CM | POA: Diagnosis not present

## 2021-04-26 DIAGNOSIS — M9902 Segmental and somatic dysfunction of thoracic region: Secondary | ICD-10-CM

## 2021-04-26 DIAGNOSIS — M9903 Segmental and somatic dysfunction of lumbar region: Secondary | ICD-10-CM | POA: Diagnosis not present

## 2021-04-26 DIAGNOSIS — M546 Pain in thoracic spine: Secondary | ICD-10-CM

## 2021-04-26 DIAGNOSIS — G8929 Other chronic pain: Secondary | ICD-10-CM

## 2021-04-26 DIAGNOSIS — M9904 Segmental and somatic dysfunction of sacral region: Secondary | ICD-10-CM

## 2021-04-26 NOTE — Assessment & Plan Note (Signed)
Did not start her metotrexate. Encouraged her to follow up with rheumatology. Call with any concerns. Continue to monitor.

## 2021-04-26 NOTE — Progress Notes (Signed)
BP 96/66    Pulse (!) 101    Wt 176 lb 9.6 oz (80.1 kg)    SpO2 99%    BMI 33.37 kg/m    Subjective:    Patient ID: Sharon Rodriguez, female    DOB: 02/22/1981, 41 y.o.   MRN: [id number]  HPI: Sharon Rodriguez is a 41 y.o. female  Chief Complaint  Patient presents with   Diabetes   body pain   DIABETES Hypoglycemic episodes:no Polydipsia/polyuria: no Visual disturbance: no Chest pain: no Paresthesias: no Glucose Monitoring: yes  Accucheck frequency: Not Checking Taking Insulin?: no Blood Pressure Monitoring: not checking Retinal Examination: Not up to Date Foot Exam: Up to Date Diabetic Education: Completed Pneumovax: Up to Date Influenza: Up to Date Aspirin: no  Has been having pain in her back and her arm. Her rheumatologist wants to start her on methotrexate, but she is anxious about it and has not started it. She has been using tumeric and herbal teas. She has not told her rheumatologist that she has not taken her methotrexate. She notes that her pain is aching and sore. It's located in her back and arm. Better with steroids. Worse with certain movements and activities. Pain will radiate into her arm. She is otherwise feeling well with no other concerns or complaints at this time.   Relevant past medical, surgical, family and social history reviewed and updated as indicated. Interim medical history since our last visit reviewed. Allergies and medications reviewed and updated.  Review of Systems  Constitutional: Negative.   Respiratory: Negative.    Cardiovascular: Negative.   Gastrointestinal: Negative.   Musculoskeletal:  Positive for arthralgias, myalgias, neck pain and neck stiffness. Negative for back pain, gait problem and joint swelling.  Skin: Negative.   Neurological: Negative.   Psychiatric/Behavioral: Negative.     Per HPI unless specifically indicated above     Objective:    BP 96/66    Pulse (!) 101    Wt 176 lb 9.6 oz (80.1 kg)    SpO2 99%    BMI  33.37 kg/m   Wt Readings from Last 3 Encounters:  04/26/21 176 lb 9.6 oz (80.1 kg)  01/24/21 183 lb 12.8 oz (83.4 kg)  12/30/20 180 lb (81.6 kg)    Physical Exam Vitals and nursing note reviewed.  Constitutional:      General: She is not in acute distress.    Appearance: Normal appearance. She is not ill-appearing, toxic-appearing or diaphoretic.  HENT:     Head: Normocephalic and atraumatic.     Right Ear: External ear normal.     Left Ear: External ear normal.     Nose: Nose normal.     Mouth/Throat:     Mouth: Mucous membranes are moist.     Pharynx: Oropharynx is clear.  Eyes:     General: No scleral icterus.       Right eye: No discharge.        Left eye: No discharge.     Extraocular Movements: Extraocular movements intact.     Conjunctiva/sclera: Conjunctivae normal.     Pupils: Pupils are equal, round, and reactive to light.  Cardiovascular:     Rate and Rhythm: Normal rate and regular rhythm.     Pulses: Normal pulses.     Heart sounds: Normal heart sounds. No murmur heard.   No friction rub. No gallop.  Pulmonary:     Effort: Pulmonary effort is normal. No respiratory distress.  Breath sounds: Normal breath sounds. No stridor. No wheezing, rhonchi or rales.  Chest:     Chest wall: No tenderness.  Musculoskeletal:        General: Normal range of motion.     Cervical back: Normal range of motion and neck supple.  Skin:    General: Skin is warm and dry.     Capillary Refill: Capillary refill takes less than 2 seconds.     Coloration: Skin is not jaundiced or pale.     Findings: No bruising, erythema, lesion or rash.  Neurological:     General: No focal deficit present.     Mental Status: She is alert and oriented to person, place, and time. Mental status is at baseline.  Psychiatric:        Mood and Affect: Mood normal.        Behavior: Behavior normal.        Thought Content: Thought content normal.        Judgment: Judgment normal.  Musculoskeletal:   Exam found Decreased ROM, Tissue texture changes, Tenderness to palpation, and Asymmetry of patient's  head, neck, thorax, ribs, lumbar, pelvis, and sacrum Osteopathic Structural Exam:   Head: hypertonic suboccipital pain, OAESRR  Neck: C3ERR, C4ERL, hypertonic cervical paraspinal muscles  Thorax: T3-5SLRR, Hypertonic trapezius and rhomboid on the R  Ribs: Ribs 4-6 locked up on the R  Lumbar: QL hypertonic on the R  Pelvis: Anterior R innominate  Sacrum: R on R torsion  Results for orders placed or performed in visit on 04/26/21  Hgb A1c w/o eAG  Result Value Ref Range   Hgb A1c MFr Bld 6.7 (H) 4.8 - 5.6 %      Assessment & Plan:   Problem List Items Addressed This Visit       Endocrine   Diabetes mellitus ([place]) - Primary    Doing well with A1c of 7.1. Will increase her ozempic to 1mg  and recheck 3 months. Call with any concerns.       Relevant Medications   Semaglutide, 1 MG/DOSE, 4 MG/3ML SOPN   Other Relevant Orders   Hgb A1c w/o eAG (Completed)     Musculoskeletal and Integument   Rheumatoid arthritis ([place])    Did not start her metotrexate. Encouraged her to follow up with rheumatology. Call with any concerns. Continue to monitor.       Relevant Medications   methotrexate 2.5 MG tablet   Other Visit Diagnoses     Chronic bilateral thoracic back pain       She does have somatic dysfunction that is contributing to her symptoms. Treated today with good results as below. Call with any concerns.    Head region somatic dysfunction       Cervical segment dysfunction       Thoracic segment dysfunction       Rib cage region somatic dysfunction       Somatic dysfunction of lumbar region       Somatic dysfunction of pelvis region       Somatic dysfunction of sacral region         After verbal consent was obtained, patient was treated today with osteopathic manipulative medicine to the regions of the head, neck, thorax, ribs, lumbar, pelvis, and sacrum using the techniques  of cranial, myofascial release, counterstrain, muscle energy, and soft tissue. Areas of compensation relating to her primary pain source also treated. Patient tolerated the procedure well with good objective and good subjective improvement in  symptoms. She left the room in good condition. She was advised to stay well hydrated and that she may have some soreness following the procedure. If not improving or worsening, he will call and come in. She will return for reevaluation  on a PRN basis.    Follow up plan: Return 3 months for 6 month follow up. PRN for OMM.

## 2021-04-27 LAB — HGB A1C W/O EAG: Hgb A1c MFr Bld: 6.7 % — ABNORMAL HIGH (ref 4.8–5.6)

## 2021-04-28 ENCOUNTER — Encounter: Payer: Self-pay | Admitting: Family Medicine

## 2021-04-28 MED ORDER — SEMAGLUTIDE (1 MG/DOSE) 4 MG/3ML ~~LOC~~ SOPN: 1.0000 mg | PEN_INJECTOR | SUBCUTANEOUS | 1 refills | Status: DC

## 2021-04-28 NOTE — Assessment & Plan Note (Signed)
Doing well with A1c of 7.1. Will increase her ozempic to 1mg  and recheck 3 months. Call with any concerns.

## 2021-05-18 DIAGNOSIS — F411 Generalized anxiety disorder: Secondary | ICD-10-CM | POA: Diagnosis not present

## 2021-05-18 DIAGNOSIS — F900 Attention-deficit hyperactivity disorder, predominantly inattentive type: Secondary | ICD-10-CM | POA: Diagnosis not present

## 2021-05-18 DIAGNOSIS — R69 Illness, unspecified: Secondary | ICD-10-CM | POA: Diagnosis not present

## 2021-06-30 ENCOUNTER — Encounter: Payer: Medicare HMO | Admitting: Student in an Organized Health Care Education/Training Program

## 2021-07-05 ENCOUNTER — Ambulatory Visit
Payer: Medicare HMO | Attending: Student in an Organized Health Care Education/Training Program | Admitting: Student in an Organized Health Care Education/Training Program

## 2021-07-05 ENCOUNTER — Encounter: Payer: Self-pay | Admitting: Student in an Organized Health Care Education/Training Program

## 2021-07-05 VITALS — BP 105/29 | HR 108 | Temp 97.3°F | Ht 61.0 in | Wt 176.0 lb

## 2021-07-05 DIAGNOSIS — G894 Chronic pain syndrome: Secondary | ICD-10-CM

## 2021-07-05 DIAGNOSIS — Z79891 Long term (current) use of opiate analgesic: Secondary | ICD-10-CM | POA: Diagnosis not present

## 2021-07-05 DIAGNOSIS — M47816 Spondylosis without myelopathy or radiculopathy, lumbar region: Secondary | ICD-10-CM | POA: Insufficient documentation

## 2021-07-05 DIAGNOSIS — M7912 Myalgia of auxiliary muscles, head and neck: Secondary | ICD-10-CM

## 2021-07-05 DIAGNOSIS — M7918 Myalgia, other site: Secondary | ICD-10-CM | POA: Insufficient documentation

## 2021-07-05 DIAGNOSIS — R5382 Chronic fatigue, unspecified: Secondary | ICD-10-CM | POA: Diagnosis not present

## 2021-07-05 DIAGNOSIS — M26609 Unspecified temporomandibular joint disorder, unspecified side: Secondary | ICD-10-CM | POA: Diagnosis not present

## 2021-07-05 DIAGNOSIS — M26629 Arthralgia of temporomandibular joint, unspecified side: Secondary | ICD-10-CM | POA: Diagnosis not present

## 2021-07-05 DIAGNOSIS — R5383 Other fatigue: Secondary | ICD-10-CM

## 2021-07-05 MED ORDER — OXYCODONE-ACETAMINOPHEN 5-325 MG PO TABS: 1.0000 | ORAL_TABLET | Freq: Four times a day (QID) | ORAL | 0 refills | Status: DC | PRN

## 2021-07-05 MED ORDER — OXYCODONE-ACETAMINOPHEN 5-325 MG PO TABS: 1.0000 | ORAL_TABLET | Freq: Four times a day (QID) | ORAL | 0 refills | Status: AC | PRN

## 2021-07-05 NOTE — Progress Notes (Signed)
Nursing Pain Medication Assessment:  ?Safety precautions to be maintained throughout the outpatient stay will include: orient to surroundings, keep bed in low position, maintain call bell within reach at all times, provide assistance with transfer out of bed and ambulation.  ?Medication Inspection Compliance: Pill count conducted under aseptic conditions, in front of the patient. Neither the pills nor the bottle was removed from the patient's sight at any time. Once count was completed pills were immediately returned to the patient in their original bottle. ? ?Medication: Oxycodone/APAP ?Pill/Patch Count:  16 of 120 pills remain ?Pill/Patch Appearance: Markings consistent with prescribed medication ?Bottle Appearance: Standard pharmacy container. Clearly labeled. ?Filled Date: 3 / 75 / 2023 ?Last Medication intake:  TodaySafety precautions to be maintained throughout the outpatient stay will include: orient to surroundings, keep bed in low position, maintain call bell within reach at all times, provide assistance with transfer out of bed and ambulation.  ?
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
## 2021-07-09 LAB — TOXASSURE SELECT 13 (MW), URINE

## 2021-07-19 ENCOUNTER — Other Ambulatory Visit: Payer: Self-pay | Admitting: Family Medicine

## 2021-07-20 NOTE — Telephone Encounter (Signed)
Requested Prescriptions  ?Pending Prescriptions Disp Refills  ?? OZEMPIC, 1 MG/DOSE, 4 MG/3ML SOPN [Pharmacy Med Name: OZEMPIC (1 MG/DOSE) 4 MG/3ML SUBQ S] 9 mL 1  ?  Sig: INJECT '1MG'$  SUBCUTANEOUSLY ONCE WEEK  ?  ? Endocrinology:  Diabetes - GLP-1 Receptor Agonists - semaglutide Failed - 07/19/2021  3:34 PM  ?  ?  Failed - HBA1C in normal range and within 180 days  ?  Hemoglobin A1C  ?Date Value Ref Range Status  ?07/08/2019 7.6  Final  ? ?HB A1C (BAYER DCA - WAIVED)  ?Date Value Ref Range Status  ?01/24/2021 7.1 (H) 4.8 - 5.6 % Final  ?  Comment:  ?           Prediabetes: 5.7 - 6.4 ?         Diabetes: >6.4 ?         Glycemic control for adults with diabetes: <7.0 ?  ? ?Hgb A1c MFr Bld  ?Date Value Ref Range Status  ?04/26/2021 6.7 (H) 4.8 - 5.6 % Final  ?  Comment:  ?           Prediabetes: 5.7 - 6.4 ?         Diabetes: >6.4 ?         Glycemic control for adults with diabetes: <7.0 ?  ?   ?  ?  Passed - Cr in normal range and within 360 days  ?  Creatinine  ?Date Value Ref Range Status  ?03/20/2013 0.86 0.60 - 1.30 mg/dL Final  ? ?Creatinine, Ser  ?Date Value Ref Range Status  ?07/29/2020 0.95 0.57 - 1.00 mg/dL Final  ?   ?  ?  Passed - Valid encounter within last 6 months  ?  Recent Outpatient Visits   ?      ? 2 months ago Type 2 diabetes mellitus with other ophthalmic complication, without long-term current use of insulin ([place])  ? [place], [place] P, DO  ? 5 months ago Type 2 diabetes mellitus with other ophthalmic complication, without long-term current use of insulin ([place])  ? [place], Megan P, DO  ? 6 months ago Encounter for [employer] annual wellness exam  ? [place], DO  ? 6 months ago Neck pain  ? [place] P, DO  ? 7 months ago Neck pain  ? [place], [place] P, DO  ?  ?  ?Future Appointments   ?        ? In 1 week Wynetta Emery, Barb Merino, DO [place]  ?  ? ?  ?  ?   ? ?

## 2021-07-25 ENCOUNTER — Ambulatory Visit: Payer: Medicare HMO | Admitting: Family Medicine

## 2021-07-27 ENCOUNTER — Encounter: Payer: Self-pay | Admitting: Family Medicine

## 2021-07-27 ENCOUNTER — Ambulatory Visit (INDEPENDENT_AMBULATORY_CARE_PROVIDER_SITE_OTHER): Payer: Medicare HMO | Admitting: Family Medicine

## 2021-07-27 VITALS — BP 115/75 | HR 112 | Wt 164.0 lb

## 2021-07-27 DIAGNOSIS — R202 Paresthesia of skin: Secondary | ICD-10-CM

## 2021-07-27 DIAGNOSIS — E559 Vitamin D deficiency, unspecified: Secondary | ICD-10-CM

## 2021-07-27 DIAGNOSIS — K219 Gastro-esophageal reflux disease without esophagitis: Secondary | ICD-10-CM

## 2021-07-27 DIAGNOSIS — J42 Unspecified chronic bronchitis: Secondary | ICD-10-CM

## 2021-07-27 DIAGNOSIS — E1139 Type 2 diabetes mellitus with other diabetic ophthalmic complication: Secondary | ICD-10-CM

## 2021-07-27 DIAGNOSIS — F339 Major depressive disorder, recurrent, unspecified: Secondary | ICD-10-CM

## 2021-07-27 DIAGNOSIS — E039 Hypothyroidism, unspecified: Secondary | ICD-10-CM

## 2021-07-27 DIAGNOSIS — R69 Illness, unspecified: Secondary | ICD-10-CM | POA: Diagnosis not present

## 2021-07-27 LAB — MICROALBUMIN, URINE WAIVED
Creatinine, Urine Waived: 300 mg/dL (ref 10–300)
Microalb, Ur Waived: 80 mg/L — ABNORMAL HIGH (ref 0–19)

## 2021-07-27 LAB — BAYER DCA HB A1C WAIVED: HB A1C (BAYER DCA - WAIVED): 5.7 % — ABNORMAL HIGH (ref 4.8–5.6)

## 2021-07-27 MED ORDER — ALBUTEROL SULFATE HFA 108 (90 BASE) MCG/ACT IN AERS: 1.0000 | INHALATION_SPRAY | Freq: Four times a day (QID) | RESPIRATORY_TRACT | 6 refills | Status: DC | PRN

## 2021-07-27 NOTE — Assessment & Plan Note (Signed)
Rechecking labs today. Await results. Treat as needed.  °

## 2021-07-27 NOTE — Assessment & Plan Note (Addendum)
Doing well with a1c of 5.7. Continue current regimen. Declines ACE and Statin. Continue to monitor. Call with any concerns.  ?

## 2021-07-27 NOTE — Assessment & Plan Note (Signed)
Doing well with just albuterol. Continue to monitor.  ?

## 2021-07-27 NOTE — Progress Notes (Signed)
? ?BP 115/75   Pulse (!) 112   Wt 164 lb (74.4 kg)   SpO2 99%   BMI 30.99 kg/m?   ? ?Subjective:  ? ? Patient ID: Sharon Rodriguez, female    DOB: November 15, 1980, 41 y.o.   MRN: [id number] ? ?HPI: ?Sharon Rodriguez is a 41 y.o. female ? ?Chief Complaint  ?Patient presents with  ? Diabetes  ?  Patient has not had eye exam this year   ? Hypothyroidism  ? ?DIABETES ?Hypoglycemic episodes:no ?Polydipsia/polyuria: no ?Visual disturbance: no ?Chest pain: no ?Paresthesias: yes ?Glucose Monitoring: no ? Accucheck frequency: Not Checking ?Taking Insulin?: no ?Blood Pressure Monitoring: not checking ?Retinal Examination: Up to Date ?Foot Exam: Up to Date ?Diabetic Education: Completed ?Pneumovax: Up to Date ?Influenza: Not up to Date ?Aspirin: no ? ?HYPOTHYROIDISM ?Thyroid control status:worse ?Satisfied with current treatment? no ?Medication side effects: no ?Medication compliance: good compliance ?Recent dose adjustment:no ?Fatigue: yes ?Cold intolerance: no ?Heat intolerance: no ?Weight gain: no ?Weight loss: no ?Constipation: no ?Diarrhea/loose stools: no ?Palpitations: no ?Lower extremity edema: no ?Anxiety/depressed mood: no ? ?NUMBNESS ?Duration: few weeks ?Onset: sudden ?Location: anterior R shin ?Bilateral: no ?Symmetric: no ?Decreased sensation: yes  ?Weakness: no ?Pain: no ?Quality:  numb and tingling ?Severity: moderate  ?Frequency: intermittent ?Trauma: no ?Recent illness: no ?Diabetes: yes ?Thyroid disease: yes  ?HIV: no  ?Alcoholism: no  ?Spinal cord injury: no ?Status: worse ? ?Relevant past medical, surgical, family and social history reviewed and updated as indicated. Interim medical history since our last visit reviewed. ?Allergies and medications reviewed and updated. ? ?Review of Systems  ?Constitutional: Negative.   ?Respiratory: Negative.    ?Cardiovascular: Negative.   ?Gastrointestinal: Negative.   ?Musculoskeletal:  Positive for arthralgias, back pain and myalgias. Negative for gait problem, joint  swelling, neck pain and neck stiffness.  ?Skin: Negative.   ?Neurological: Negative.   ?Psychiatric/Behavioral: Negative.    ? ?Per HPI unless specifically indicated above ? ?   ?Objective:  ?  ?BP 115/75   Pulse (!) 112   Wt 164 lb (74.4 kg)   SpO2 99%   BMI 30.99 kg/m?   ?Wt Readings from Last 3 Encounters:  ?07/27/21 164 lb (74.4 kg)  ?07/05/21 176 lb (79.8 kg)  ?04/26/21 176 lb 9.6 oz (80.1 kg)  ?  ?Physical Exam ?Vitals and nursing note reviewed.  ?Constitutional:   ?   General: She is not in acute distress. ?   Appearance: Normal appearance. She is not ill-appearing, toxic-appearing or diaphoretic.  ?HENT:  ?   Head: Normocephalic and atraumatic.  ?   Right Ear: External ear normal.  ?   Left Ear: External ear normal.  ?   Nose: Nose normal.  ?   Mouth/Throat:  ?   Mouth: Mucous membranes are moist.  ?   Pharynx: Oropharynx is clear.  ?Eyes:  ?   General: No scleral icterus.    ?   Right eye: No discharge.     ?   Left eye: No discharge.  ?   Extraocular Movements: Extraocular movements intact.  ?   Conjunctiva/sclera: Conjunctivae normal.  ?   Pupils: Pupils are equal, round, and reactive to light.  ?Cardiovascular:  ?   Rate and Rhythm: Normal rate and regular rhythm.  ?   Pulses: Normal pulses.  ?   Heart sounds: Normal heart sounds. No murmur heard. ?  No friction rub. No gallop.  ?Pulmonary:  ?   Effort: Pulmonary effort is normal. No  respiratory distress.  ?   Breath sounds: Normal breath sounds. No stridor. No wheezing, rhonchi or rales.  ?Chest:  ?   Chest wall: No tenderness.  ?Musculoskeletal:     ?   General: Normal range of motion.  ?   Cervical back: Normal range of motion and neck supple.  ?Skin: ?   General: Skin is warm and dry.  ?   Capillary Refill: Capillary refill takes less than 2 seconds.  ?   Coloration: Skin is not jaundiced or pale.  ?   Findings: No bruising, erythema, lesion or rash.  ?Neurological:  ?   General: No focal deficit present.  ?   Mental Status: She is alert and  oriented to person, place, and time. Mental status is at baseline.  ?Psychiatric:     ?   Mood and Affect: Mood normal.     ?   Behavior: Behavior normal.     ?   Thought Content: Thought content normal.     ?   Judgment: Judgment normal.  ? ? ?Results for orders placed or performed in visit on 07/27/21  ?Bayer DCA Hb A1c Waived  ?Result Value Ref Range  ? HB A1C (BAYER DCA - WAIVED) 5.7 (H) 4.8 - 5.6 %  ? ?   ?Assessment & Plan:  ? ?Problem List Items Addressed This Visit   ? ?  ? Digestive  ? Gastroesophageal reflux disease  ?  Under good control off medicine. Continue to monitor. Call with any concerns.  ? ?  ?  ? Relevant Orders  ? Comprehensive metabolic panel  ? CBC with Differential/Platelet  ?  ? Endocrine  ? Hypothyroidism - Primary  ?  Rechecking labs today. Await results. Treat as needed.  ? ?  ?  ? Relevant Orders  ? Comprehensive metabolic panel  ? CBC with Differential/Platelet  ? TSH  ? Diabetes mellitus ([place])  ?  Doing well with a1c of 5.7. Continue current regimen. Declines ACE and Statin. Continue to monitor. Call with any concerns.  ? ?  ?  ? Relevant Orders  ? Comprehensive metabolic panel  ? CBC with Differential/Platelet  ? Bayer DCA Hb A1c Waived (Completed)  ? Lipid Panel w/o Chol/HDL Ratio  ? Microalbumin, Urine Waived  ?  ? Other  ? Depression  ?  Following with psychiatry. Call with any concerns.  ? ?  ?  ? Vitamin D deficiency  ?  Rechecking labs today. Await results. Treat as needed.  ? ?  ?  ? Relevant Orders  ? Comprehensive metabolic panel  ? CBC with Differential/Platelet  ? VITAMIN D 25 Hydroxy (Vit-D Deficiency, Fractures)  ? ?Other Visit Diagnoses   ? ? Paresthesias      ? Concern for radiculopathy from her back. Will check labs to look for issue with her thyroid, if normal, consider back imaging.   ? ?  ?  ? ?Follow up plan: ?Return 77month for physical, as able for OMM eval (420m). ? ? ? ? ? ?

## 2021-07-27 NOTE — Assessment & Plan Note (Addendum)
Under good control off medicine. Continue to monitor. Call with any concerns.  ?

## 2021-07-27 NOTE — Assessment & Plan Note (Signed)
Following with psychiatry. Call with any concerns.  ?

## 2021-07-28 ENCOUNTER — Ambulatory Visit (INDEPENDENT_AMBULATORY_CARE_PROVIDER_SITE_OTHER): Payer: Medicare HMO | Admitting: Family Medicine

## 2021-07-28 ENCOUNTER — Other Ambulatory Visit: Payer: Self-pay | Admitting: Family Medicine

## 2021-07-28 ENCOUNTER — Encounter: Payer: Self-pay | Admitting: Family Medicine

## 2021-07-28 VITALS — BP 98/56 | HR 78 | Temp 98.1°F | Wt 165.0 lb

## 2021-07-28 DIAGNOSIS — M9901 Segmental and somatic dysfunction of cervical region: Secondary | ICD-10-CM | POA: Diagnosis not present

## 2021-07-28 DIAGNOSIS — M9908 Segmental and somatic dysfunction of rib cage: Secondary | ICD-10-CM

## 2021-07-28 DIAGNOSIS — M9909 Segmental and somatic dysfunction of abdomen and other regions: Secondary | ICD-10-CM | POA: Diagnosis not present

## 2021-07-28 DIAGNOSIS — M9903 Segmental and somatic dysfunction of lumbar region: Secondary | ICD-10-CM | POA: Diagnosis not present

## 2021-07-28 DIAGNOSIS — M9904 Segmental and somatic dysfunction of sacral region: Secondary | ICD-10-CM

## 2021-07-28 DIAGNOSIS — M9905 Segmental and somatic dysfunction of pelvic region: Secondary | ICD-10-CM | POA: Diagnosis not present

## 2021-07-28 DIAGNOSIS — M9902 Segmental and somatic dysfunction of thoracic region: Secondary | ICD-10-CM

## 2021-07-28 DIAGNOSIS — M9900 Segmental and somatic dysfunction of head region: Secondary | ICD-10-CM | POA: Diagnosis not present

## 2021-07-28 DIAGNOSIS — M546 Pain in thoracic spine: Secondary | ICD-10-CM

## 2021-07-28 DIAGNOSIS — G8929 Other chronic pain: Secondary | ICD-10-CM

## 2021-07-28 DIAGNOSIS — E039 Hypothyroidism, unspecified: Secondary | ICD-10-CM

## 2021-07-28 LAB — CBC WITH DIFFERENTIAL/PLATELET
Basophils Absolute: 0 10*3/uL (ref 0.0–0.2)
Basos: 0 %
EOS (ABSOLUTE): 0.2 10*3/uL (ref 0.0–0.4)
Eos: 2 %
Hematocrit: 40.7 % (ref 34.0–46.6)
Hemoglobin: 13.4 g/dL (ref 11.1–15.9)
Immature Grans (Abs): 0 10*3/uL (ref 0.0–0.1)
Immature Granulocytes: 0 %
Lymphocytes Absolute: 3.1 10*3/uL (ref 0.7–3.1)
Lymphs: 30 %
MCH: 27.5 pg (ref 26.6–33.0)
MCHC: 32.9 g/dL (ref 31.5–35.7)
MCV: 83 fL (ref 79–97)
Monocytes Absolute: 0.4 10*3/uL (ref 0.1–0.9)
Monocytes: 4 %
Neutrophils Absolute: 6.5 10*3/uL (ref 1.4–7.0)
Neutrophils: 64 %
Platelets: 288 10*3/uL (ref 150–450)
RBC: 4.88 x10E6/uL (ref 3.77–5.28)
RDW: 13.2 % (ref 11.7–15.4)
WBC: 10.3 10*3/uL (ref 3.4–10.8)

## 2021-07-28 LAB — COMPREHENSIVE METABOLIC PANEL
ALT: 16 IU/L (ref 0–32)
AST: 27 IU/L (ref 0–40)
Albumin/Globulin Ratio: 1.5 (ref 1.2–2.2)
Albumin: 4.3 g/dL (ref 3.8–4.8)
Alkaline Phosphatase: 75 IU/L (ref 44–121)
BUN/Creatinine Ratio: 14 (ref 9–23)
BUN: 11 mg/dL (ref 6–24)
Bilirubin Total: 0.3 mg/dL (ref 0.0–1.2)
CO2: 20 mmol/L (ref 20–29)
Calcium: 9.2 mg/dL (ref 8.7–10.2)
Chloride: 102 mmol/L (ref 96–106)
Creatinine, Ser: 0.77 mg/dL (ref 0.57–1.00)
Globulin, Total: 2.8 g/dL (ref 1.5–4.5)
Glucose: 106 mg/dL — ABNORMAL HIGH (ref 70–99)
Potassium: 4 mmol/L (ref 3.5–5.2)
Sodium: 138 mmol/L (ref 134–144)
Total Protein: 7.1 g/dL (ref 6.0–8.5)
eGFR: 99 mL/min/{1.73_m2} (ref >59)

## 2021-07-28 LAB — VITAMIN D 25 HYDROXY (VIT D DEFICIENCY, FRACTURES): Vit D, 25-Hydroxy: 39.8 ng/mL (ref 30.0–100.0)

## 2021-07-28 LAB — TSH: TSH: 0.208 u[IU]/mL — ABNORMAL LOW (ref 0.450–4.500)

## 2021-07-28 LAB — LIPID PANEL W/O CHOL/HDL RATIO
Cholesterol, Total: 129 mg/dL (ref 100–199)
HDL: 32 mg/dL — ABNORMAL LOW (ref >39)
LDL Chol Calc (NIH): 78 mg/dL (ref 0–99)
Triglycerides: 98 mg/dL (ref 0–149)
VLDL Cholesterol Cal: 19 mg/dL (ref 5–40)

## 2021-07-28 MED ORDER — THYROID 65 MG PO TABS: 65.0000 mg | ORAL_TABLET | Freq: Every day | ORAL | 2 refills | Status: DC

## 2021-07-28 NOTE — Progress Notes (Signed)
BP (!) 98/56   Pulse 78   Temp 98.1 F (36.7 C)   Wt 165 lb (74.8 kg)   SpO2 97%   BMI 31.18 kg/m    Subjective:    Patient ID: Sharon Rodriguez, female    DOB: [birth year], 41 y.o.   MRN: [id number]  HPI: Sharon Rodriguez is a 41 y.o. female  Chief Complaint  Patient presents with   Back Pain    Patient is here for OMM    Sharon Rodriguez presents today for evaluation and possible treatment with OMT for back pain. Her pain is chronic but much worse in the past couple of weeks. Her pain is in her mid back and low back on the R side. She has some numbness and tingling in her anterior shin on the R side. She notes that here pain is aching and sore. Her medicine makes it better and gardening makes it worse. Pain does not radiate. She is otherwise feeling well. No other concerns or complaints at this time.   Relevant past medical, surgical, family and social history reviewed and updated as indicated. Interim medical history since our last visit reviewed. Allergies and medications reviewed and updated.  Review of Systems  Constitutional: Negative.   Respiratory: Negative.    Cardiovascular: Negative.   Musculoskeletal:  Positive for back pain and myalgias. Negative for arthralgias, gait problem, joint swelling, neck pain and neck stiffness.  Neurological: Negative.   Psychiatric/Behavioral: Negative.     Per HPI unless specifically indicated above     Objective:    BP (!) 98/56   Pulse 78   Temp 98.1 F (36.7 C)   Wt 165 lb (74.8 kg)   SpO2 97%   BMI 31.18 kg/m   Wt Readings from Last 3 Encounters:  07/28/21 165 lb (74.8 kg)  07/27/21 164 lb (74.4 kg)  07/05/21 176 lb (79.8 kg)    Physical Exam Vitals and nursing note reviewed.  Constitutional:      General: She is not in acute distress.    Appearance: Normal appearance. She is not ill-appearing.  HENT:     Head: Normocephalic and atraumatic.     Right Ear: External ear normal.     Left Ear: External ear normal.      Nose: Nose normal.     Mouth/Throat:     Mouth: Mucous membranes are moist.     Pharynx: Oropharynx is clear.  Eyes:     Extraocular Movements: Extraocular movements intact.     Conjunctiva/sclera: Conjunctivae normal.     Pupils: Pupils are equal, round, and reactive to light.  Neck:     Vascular: No carotid bruit.  Cardiovascular:     Rate and Rhythm: Normal rate.     Pulses: Normal pulses.  Pulmonary:     Effort: Pulmonary effort is normal. No respiratory distress.  Abdominal:     General: Abdomen is flat. There is no distension.     Palpations: Abdomen is soft. There is no mass.     Tenderness: There is no abdominal tenderness. There is no right CVA tenderness, left CVA tenderness, guarding or rebound.     Hernia: No hernia is present.  Musculoskeletal:     Cervical back: No muscular tenderness.  Lymphadenopathy:     Cervical: No cervical adenopathy.  Skin:    General: Skin is warm and dry.     Capillary Refill: Capillary refill takes less than 2 seconds.     Coloration: Skin is not  jaundiced or pale.     Findings: No bruising, erythema, lesion or rash.  Neurological:     General: No focal deficit present.     Mental Status: She is alert. Mental status is at baseline.  Psychiatric:        Mood and Affect: Mood normal.        Behavior: Behavior normal.        Thought Content: Thought content normal.        Judgment: Judgment normal.   Musculoskeletal:  Exam found Decreased ROM, Tissue texture changes, Tenderness to palpation, and Asymmetry of patient's  head, neck, thorax, ribs, lumbar, pelvis, sacrum, and abdomen Osteopathic Structural Exam:   Head: OAESSR, hypertonic suboccipital muscles  Neck: C3ESRR, C4ESRL, traps hypertonic R>L, SCM hypertonic on the R  Thorax: T3-5SLRR  Ribs: Ribs 5-9 locked up on the L, Rib 6 locked up on the R  Lumbar: QL hypertonic on the R, psoas hypertonic on the R, L3-5SRRL  Pelvis: Anterior R innominate  Sacrum: R on R torsion  Abdomen:  diaphragm hypertonic R>L  Results for orders placed or performed in visit on 07/27/21  Comprehensive metabolic panel  Result Value Ref Range   Glucose 106 (H) 70 - 99 mg/dL   BUN 11 6 - 24 mg/dL   Creatinine, Ser 0.77 0.57 - 1.00 mg/dL   eGFR 99 >59 mL/min/1.73   BUN/Creatinine Ratio 14 9 - 23   Sodium 138 134 - 144 mmol/L   Potassium 4.0 3.5 - 5.2 mmol/L   Chloride 102 96 - 106 mmol/L   CO2 20 20 - 29 mmol/L   Calcium 9.2 8.7 - 10.2 mg/dL   Total Protein 7.1 6.0 - 8.5 g/dL   Albumin 4.3 3.8 - 4.8 g/dL   Globulin, Total 2.8 1.5 - 4.5 g/dL   Albumin/Globulin Ratio 1.5 1.2 - 2.2   Bilirubin Total 0.3 0.0 - 1.2 mg/dL   Alkaline Phosphatase 75 44 - 121 IU/L   AST 27 0 - 40 IU/L   ALT 16 0 - 32 IU/L  CBC with Differential/Platelet  Result Value Ref Range   WBC 10.3 3.4 - 10.8 x10E3/uL   RBC 4.88 3.77 - 5.28 x10E6/uL   Hemoglobin 13.4 11.1 - 15.9 g/dL   Hematocrit 40.7 34.0 - 46.6 %   MCV 83 79 - 97 fL   MCH 27.5 26.6 - 33.0 pg   MCHC 32.9 31.5 - 35.7 g/dL   RDW 13.2 11.7 - 15.4 %   Platelets 288 150 - 450 x10E3/uL   Neutrophils 64 Not Estab. %   Lymphs 30 Not Estab. %   Monocytes 4 Not Estab. %   Eos 2 Not Estab. %   Basos 0 Not Estab. %   Neutrophils Absolute 6.5 1.4 - 7.0 x10E3/uL   Lymphocytes Absolute 3.1 0.7 - 3.1 x10E3/uL   Monocytes Absolute 0.4 0.1 - 0.9 x10E3/uL   EOS (ABSOLUTE) 0.2 0.0 - 0.4 x10E3/uL   Basophils Absolute 0.0 0.0 - 0.2 x10E3/uL   Immature Granulocytes 0 Not Estab. %   Immature Grans (Abs) 0.0 0.0 - 0.1 x10E3/uL  Bayer DCA Hb A1c Waived  Result Value Ref Range   HB A1C (BAYER DCA - WAIVED) 5.7 (H) 4.8 - 5.6 %  Lipid Panel w/o Chol/HDL Ratio  Result Value Ref Range   Cholesterol, Total 129 100 - 199 mg/dL   Triglycerides 98 0 - 149 mg/dL   HDL 32 (L) >39 mg/dL   VLDL Cholesterol Cal 19 5 - 40  mg/dL   LDL Chol Calc (NIH) 78 0 - 99 mg/dL  TSH  Result Value Ref Range   TSH 0.208 (L) 0.450 - 4.500 uIU/mL  VITAMIN D 25 Hydroxy (Vit-D  Deficiency, Fractures)  Result Value Ref Range   Vit D, 25-Hydroxy 39.8 30.0 - 100.0 ng/mL  Microalbumin, Urine Waived  Result Value Ref Range   Microalb, Ur Waived 80 (H) 0 - 19 mg/L   Creatinine, Urine Waived 300 10 - 300 mg/dL   Microalb/Creat Ratio 30-300 (H) <30 mg/g      Assessment & Plan:   Problem List Items Addressed This Visit   None Visit Diagnoses     Chronic bilateral thoracic back pain    -  Primary   In exacerbation due to gardening. Treated today with good results as below. Call with any concerns. Continue to monitor.    Head region somatic dysfunction       Cervical segment dysfunction       Thoracic segment dysfunction       Rib cage region somatic dysfunction       Somatic dysfunction of lumbar region       Somatic dysfunction of sacral region       Somatic dysfunction of pelvis region       Segmental dysfunction of abdomen         After verbal consent was obtained, patient was treated today with osteopathic manipulative medicine to the regions of the head, neck, thorax, ribs, lumbar, pelvis, sacrum, and abdomen using the techniques of cranial, myofascial release, counterstrain, muscle energy, HVLA, and soft tissue. Areas of compensation relating to her primary pain source also treated. Patient tolerated the procedure well with good objective and good subjective improvement in symptoms. She left the room in good condition. She was advised to stay well hydrated and that she may have some soreness following the procedure. If not improving or worsening, she will call and come in. She will return for reevaluation  on a PRN basis.    Follow up plan: Return if symptoms worsen or fail to improve.

## 2021-07-29 ENCOUNTER — Telehealth: Payer: Self-pay | Admitting: *Deleted

## 2021-07-29 DIAGNOSIS — M79604 Pain in right leg: Secondary | ICD-10-CM | POA: Diagnosis not present

## 2021-07-29 DIAGNOSIS — M25551 Pain in right hip: Secondary | ICD-10-CM | POA: Diagnosis not present

## 2021-07-29 DIAGNOSIS — Z79890 Hormone replacement therapy (postmenopausal): Secondary | ICD-10-CM | POA: Diagnosis not present

## 2021-07-29 DIAGNOSIS — M79661 Pain in right lower leg: Secondary | ICD-10-CM | POA: Diagnosis not present

## 2021-07-29 DIAGNOSIS — Z7984 Long term (current) use of oral hypoglycemic drugs: Secondary | ICD-10-CM | POA: Diagnosis not present

## 2021-07-29 DIAGNOSIS — E039 Hypothyroidism, unspecified: Secondary | ICD-10-CM | POA: Diagnosis not present

## 2021-07-29 DIAGNOSIS — M1990 Unspecified osteoarthritis, unspecified site: Secondary | ICD-10-CM | POA: Diagnosis not present

## 2021-07-29 DIAGNOSIS — J45909 Unspecified asthma, uncomplicated: Secondary | ICD-10-CM | POA: Diagnosis not present

## 2021-07-29 DIAGNOSIS — R69 Illness, unspecified: Secondary | ICD-10-CM | POA: Diagnosis not present

## 2021-07-29 DIAGNOSIS — Z79899 Other long term (current) drug therapy: Secondary | ICD-10-CM | POA: Diagnosis not present

## 2021-07-29 DIAGNOSIS — Z818 Family history of other mental and behavioral disorders: Secondary | ICD-10-CM | POA: Diagnosis not present

## 2021-07-29 DIAGNOSIS — R102 Pelvic and perineal pain: Secondary | ICD-10-CM | POA: Diagnosis not present

## 2021-07-29 DIAGNOSIS — G8929 Other chronic pain: Secondary | ICD-10-CM | POA: Diagnosis not present

## 2021-07-29 DIAGNOSIS — E119 Type 2 diabetes mellitus without complications: Secondary | ICD-10-CM | POA: Diagnosis not present

## 2021-07-29 DIAGNOSIS — M797 Fibromyalgia: Secondary | ICD-10-CM | POA: Diagnosis not present

## 2021-07-29 DIAGNOSIS — Z8249 Family history of ischemic heart disease and other diseases of the circulatory system: Secondary | ICD-10-CM | POA: Diagnosis not present

## 2021-07-29 NOTE — Telephone Encounter (Signed)
Patient reports she was notified by pharmacy that 65 mcg is not available:thyroid (ARMOUR) 65 MG tablet   Patient states she is presently using a 2 pill combination now- 60 and 15- she wants to know if provider wants to do another combination of 60 and 5 if available- please let her know

## 2021-08-01 ENCOUNTER — Telehealth: Payer: Self-pay | Admitting: Family Medicine

## 2021-08-01 NOTE — Telephone Encounter (Signed)
Copied from [place] [id number]. Topic: General - Other >> Aug 01, 2021 11:26 AM Yvette Rack wrote: Reason for CRM: Mandie with [facility] reports that they will need a new Rx because the Rx for thyroid (ARMOUR) only comes in 15 mg, 30 mg, 60 mg, or 90 mg.

## 2021-08-02 ENCOUNTER — Encounter: Payer: Self-pay | Admitting: Family Medicine

## 2021-08-02 ENCOUNTER — Telehealth: Payer: Self-pay | Admitting: *Deleted

## 2021-08-02 ENCOUNTER — Other Ambulatory Visit: Payer: Self-pay | Admitting: Family Medicine

## 2021-08-02 MED ORDER — THYROID 60 MG PO TABS: 60.0000 mg | ORAL_TABLET | Freq: Every day | ORAL | 2 refills | Status: DC

## 2021-08-02 MED ORDER — THYROID 15 MG PO TABS: 7.5000 mg | ORAL_TABLET | Freq: Every day | ORAL | 2 refills | Status: DC

## 2021-08-02 NOTE — Telephone Encounter (Signed)
Transition Care Management Unsuccessful Follow-up Telephone Call  Date of discharge and from where:  [facility]  Attempts:  1st Attempt  Reason for unsuccessful TCM follow-up call:  Left voice message

## 2021-08-03 MED ORDER — ARMOUR THYROID 15 MG PO TABS: 7.5000 mg | ORAL_TABLET | Freq: Every day | ORAL | 2 refills | Status: DC

## 2021-08-03 MED ORDER — ARMOUR THYROID 60 MG PO TABS: 60.0000 mg | ORAL_TABLET | Freq: Every day | ORAL | 2 refills | Status: DC

## 2021-08-03 NOTE — Telephone Encounter (Signed)
Transition Care Management Follow-up Telephone Call Date of discharge and from where: [facility] 07-30-2021 How have you been since you were released from the hospital? Feeling sore Any questions or concerns? No  Items Reviewed: Did the pt receive and understand the discharge instructions provided? Yes  Medications obtained and verified? Yes  Other? No  Any new allergies since your discharge? No  Dietary orders reviewed? No Do you have support at home? Yes   Home Care and Equipment/Supplies: Were home health services ordered? not applicable If so, what is the name of the agency?   Has the agency set up a time to come to the patient's home? not applicable Were any new equipment or medical supplies ordered?  No What is the name of the medical supply agency?  Were you able to get the supplies/equipment? not applicable Do you have any questions related to the use of the equipment or supplies? No  Functional Questionnaire: (I = Independent and D = Dependent) ADLs: I  Bathing/Dressing- I  Meal Prep- I  Eating- I  Maintaining continence- I  Transferring/Ambulation- I  Managing Meds- I  Follow up appointments reviewed:  PCP Hospital f/u appt confirmed?  Patient will call to schedule Specialist Hospital f/u appt confirmed? No  Scheduled to see  . Are transportation arrangements needed? No  If their condition worsens, is the pt aware to call PCP or go to the Emergency Dept.? Yes Was the patient provided with contact information for the PCP's office or ED? Yes Was to pt encouraged to call back with questions or concerns? Yes

## 2021-08-29 ENCOUNTER — Encounter: Payer: Self-pay | Admitting: Family Medicine

## 2021-08-29 ENCOUNTER — Ambulatory Visit (INDEPENDENT_AMBULATORY_CARE_PROVIDER_SITE_OTHER): Payer: Medicare HMO | Admitting: Family Medicine

## 2021-08-29 VITALS — BP 100/62 | HR 92 | Temp 98.4°F | Wt 162.2 lb

## 2021-08-29 DIAGNOSIS — M9900 Segmental and somatic dysfunction of head region: Secondary | ICD-10-CM

## 2021-08-29 DIAGNOSIS — M9903 Segmental and somatic dysfunction of lumbar region: Secondary | ICD-10-CM | POA: Diagnosis not present

## 2021-08-29 DIAGNOSIS — M9904 Segmental and somatic dysfunction of sacral region: Secondary | ICD-10-CM | POA: Diagnosis not present

## 2021-08-29 DIAGNOSIS — M9908 Segmental and somatic dysfunction of rib cage: Secondary | ICD-10-CM

## 2021-08-29 DIAGNOSIS — M9909 Segmental and somatic dysfunction of abdomen and other regions: Secondary | ICD-10-CM | POA: Diagnosis not present

## 2021-08-29 DIAGNOSIS — M9901 Segmental and somatic dysfunction of cervical region: Secondary | ICD-10-CM

## 2021-08-29 DIAGNOSIS — G8929 Other chronic pain: Secondary | ICD-10-CM | POA: Diagnosis not present

## 2021-08-29 DIAGNOSIS — M546 Pain in thoracic spine: Secondary | ICD-10-CM

## 2021-08-29 DIAGNOSIS — M9902 Segmental and somatic dysfunction of thoracic region: Secondary | ICD-10-CM | POA: Diagnosis not present

## 2021-08-29 DIAGNOSIS — M9905 Segmental and somatic dysfunction of pelvic region: Secondary | ICD-10-CM

## 2021-08-29 NOTE — Progress Notes (Signed)
BP 100/62   Pulse 92   Temp 98.4 F (36.9 C) (Oral)   Wt 162 lb 3.2 oz (73.6 kg)   LMP 08/25/2021 (Within Days)   SpO2 97%   BMI 30.65 kg/m    Subjective:    Patient ID: Sharon Rodriguez, female    DOB: 09/26/1980, 41 y.o.   MRN: [id number]  HPI: Sharon Rodriguez is a 41 y.o. female  Chief Complaint  Patient presents with   Back Pain    Pt here for OMM   Sharon Rodriguez presents today for evaluation and possible treatment with OMT. She notes that things are about the same. She notes that she is having pain in her lower back and butt on the R side. It is not going anywhere. Having some pain in her L knee as well. Her pain is chronic and about the same. She notes that she did well following her last appointment. Better with OMT worse with movement and touch. She is otherwise doing well with no other concerns or complaints at this time.    Relevant past medical, surgical, family and social history reviewed and updated as indicated. Interim medical history since our last visit reviewed. Allergies and medications reviewed and updated.  Review of Systems  Constitutional: Negative.   Respiratory: Negative.    Cardiovascular: Negative.   Musculoskeletal:  Positive for arthralgias, back pain and myalgias. Negative for gait problem, joint swelling, neck pain and neck stiffness.  Skin: Negative.   Neurological: Negative.   Psychiatric/Behavioral: Negative.      Per HPI unless specifically indicated above     Objective:    BP 100/62   Pulse 92   Temp 98.4 F (36.9 C) (Oral)   Wt 162 lb 3.2 oz (73.6 kg)   LMP 08/25/2021 (Within Days)   SpO2 97%   BMI 30.65 kg/m   Wt Readings from Last 3 Encounters:  08/29/21 162 lb 3.2 oz (73.6 kg)  07/28/21 165 lb (74.8 kg)  07/27/21 164 lb (74.4 kg)    Physical Exam Vitals and nursing note reviewed.  Constitutional:      General: She is not in acute distress.    Appearance: Normal appearance. She is not ill-appearing.  HENT:     Head:  Normocephalic and atraumatic.     Right Ear: External ear normal.     Left Ear: External ear normal.     Nose: Nose normal.     Mouth/Throat:     Mouth: Mucous membranes are moist.     Pharynx: Oropharynx is clear.  Eyes:     Extraocular Movements: Extraocular movements intact.     Conjunctiva/sclera: Conjunctivae normal.     Pupils: Pupils are equal, round, and reactive to light.  Neck:     Vascular: No carotid bruit.  Cardiovascular:     Rate and Rhythm: Normal rate.     Pulses: Normal pulses.  Pulmonary:     Effort: Pulmonary effort is normal. No respiratory distress.  Abdominal:     General: Abdomen is flat. There is no distension.     Palpations: Abdomen is soft. There is no mass.     Tenderness: There is no abdominal tenderness. There is no right CVA tenderness, left CVA tenderness, guarding or rebound.     Hernia: No hernia is present.  Musculoskeletal:     Cervical back: No muscular tenderness.  Lymphadenopathy:     Cervical: No cervical adenopathy.  Skin:    General: Skin is warm and dry.  Capillary Refill: Capillary refill takes less than 2 seconds.     Coloration: Skin is not jaundiced or pale.     Findings: No bruising, erythema, lesion or rash.  Neurological:     General: No focal deficit present.     Mental Status: She is alert. Mental status is at baseline.  Psychiatric:        Mood and Affect: Mood normal.        Behavior: Behavior normal.        Thought Content: Thought content normal.        Judgment: Judgment normal.    Musculoskeletal:  Exam found Decreased ROM, Tissue texture changes, Tenderness to palpation, and Asymmetry of patient's  head, neck, thorax, ribs, lumbar, pelvis, sacrum, and abdomen Osteopathic Structural Exam:   Head: OAESSR, hypertonic suboccipital muscles, good movement at the SBS  Neck: traps hypertonic bilaterally L>R  Thorax: T1-3SRRL, T5 ESRR  Ribs: Ribs 4-8 locked up on the L  Lumbar: QL hypertonic bilaterally R>L,  L3-5SLRR  Pelvis: Posterior R innominate, pubic symphysis restricted  Sacrum: R on R torsion, SO joint restricted on the R  Abdomen: diaphragm hypertonic R >L  Results for orders placed or performed in visit on 07/27/21  Comprehensive metabolic panel  Result Value Ref Range   Glucose 106 (H) 70 - 99 mg/dL   BUN 11 6 - 24 mg/dL   Creatinine, Ser 0.77 0.57 - 1.00 mg/dL   eGFR 99 >59 mL/min/1.73   BUN/Creatinine Ratio 14 9 - 23   Sodium 138 134 - 144 mmol/L   Potassium 4.0 3.5 - 5.2 mmol/L   Chloride 102 96 - 106 mmol/L   CO2 20 20 - 29 mmol/L   Calcium 9.2 8.7 - 10.2 mg/dL   Total Protein 7.1 6.0 - 8.5 g/dL   Albumin 4.3 3.8 - 4.8 g/dL   Globulin, Total 2.8 1.5 - 4.5 g/dL   Albumin/Globulin Ratio 1.5 1.2 - 2.2   Bilirubin Total 0.3 0.0 - 1.2 mg/dL   Alkaline Phosphatase 75 44 - 121 IU/L   AST 27 0 - 40 IU/L   ALT 16 0 - 32 IU/L  CBC with Differential/Platelet  Result Value Ref Range   WBC 10.3 3.4 - 10.8 x10E3/uL   RBC 4.88 3.77 - 5.28 x10E6/uL   Hemoglobin 13.4 11.1 - 15.9 g/dL   Hematocrit 40.7 34.0 - 46.6 %   MCV 83 79 - 97 fL   MCH 27.5 26.6 - 33.0 pg   MCHC 32.9 31.5 - 35.7 g/dL   RDW 13.2 11.7 - 15.4 %   Platelets 288 150 - 450 x10E3/uL   Neutrophils 64 Not Estab. %   Lymphs 30 Not Estab. %   Monocytes 4 Not Estab. %   Eos 2 Not Estab. %   Basos 0 Not Estab. %   Neutrophils Absolute 6.5 1.4 - 7.0 x10E3/uL   Lymphocytes Absolute 3.1 0.7 - 3.1 x10E3/uL   Monocytes Absolute 0.4 0.1 - 0.9 x10E3/uL   EOS (ABSOLUTE) 0.2 0.0 - 0.4 x10E3/uL   Basophils Absolute 0.0 0.0 - 0.2 x10E3/uL   Immature Granulocytes 0 Not Estab. %   Immature Grans (Abs) 0.0 0.0 - 0.1 x10E3/uL  Bayer DCA Hb A1c Waived  Result Value Ref Range   HB A1C (BAYER DCA - WAIVED) 5.7 (H) 4.8 - 5.6 %  Lipid Panel w/o Chol/HDL Ratio  Result Value Ref Range   Cholesterol, Total 129 100 - 199 mg/dL   Triglycerides 98 0 - 149 mg/dL  HDL 32 (L) >39 mg/dL   VLDL Cholesterol Cal 19 5 - 40 mg/dL   LDL Chol  Calc (NIH) 78 0 - 99 mg/dL  TSH  Result Value Ref Range   TSH 0.208 (L) 0.450 - 4.500 uIU/mL  VITAMIN D 25 Hydroxy (Vit-D Deficiency, Fractures)  Result Value Ref Range   Vit D, 25-Hydroxy 39.8 30.0 - 100.0 ng/mL  Microalbumin, Urine Waived  Result Value Ref Range   Microalb, Ur Waived 80 (H) 0 - 19 mg/L   Creatinine, Urine Waived 300 10 - 300 mg/dL   Microalb/Creat Ratio 30-300 (H) <30 mg/g      Assessment & Plan:   Problem List Items Addressed This Visit   None Visit Diagnoses     Chronic bilateral thoracic back pain    -  Primary   Chronic. She does have somatic dysfunction. Treated today with good results as below. Offered PT, she will hold. Call with any concerns. Continue to monitor.    Head region somatic dysfunction       Cervical segment dysfunction       Thoracic segment dysfunction       Rib cage region somatic dysfunction       Somatic dysfunction of lumbar region       Somatic dysfunction of sacral region       Somatic dysfunction of pelvis region       Segmental dysfunction of abdomen         After verbal consent was obtained, patient was treated today with osteopathic manipulative medicine to the regions of the head, neck, thorax, ribs, lumbar, pelvis, sacrum, and abdomen using the techniques of cranial, myofascial release, counterstrain, muscle energy, HVLA, and soft tissue. Areas of compensation relating to her primary pain source also treated. Patient tolerated the procedure well with good objective and good subjective improvement in symptoms. She left the room in good condition. She was advised to stay well hydrated and that she may have some soreness following the procedure. If not improving or worsening, she will call and come in. She will return for reevaluation  on a PRN basis.    Follow up plan: Return if symptoms worsen or fail to improve.

## 2021-09-08 ENCOUNTER — Other Ambulatory Visit: Payer: Medicare HMO

## 2021-09-08 DIAGNOSIS — E039 Hypothyroidism, unspecified: Secondary | ICD-10-CM

## 2021-09-09 ENCOUNTER — Other Ambulatory Visit: Payer: Self-pay | Admitting: Family Medicine

## 2021-09-09 DIAGNOSIS — E039 Hypothyroidism, unspecified: Secondary | ICD-10-CM

## 2021-09-09 LAB — TSH: TSH: 0.288 u[IU]/mL — ABNORMAL LOW (ref 0.450–4.500)

## 2021-09-09 MED ORDER — ARMOUR THYROID 60 MG PO TABS: 60.0000 mg | ORAL_TABLET | Freq: Every day | ORAL | 1 refills | Status: DC

## 2021-10-04 ENCOUNTER — Ambulatory Visit
Payer: Medicare HMO | Attending: Student in an Organized Health Care Education/Training Program | Admitting: Student in an Organized Health Care Education/Training Program

## 2021-10-04 ENCOUNTER — Encounter: Payer: Self-pay | Admitting: Student in an Organized Health Care Education/Training Program

## 2021-10-04 VITALS — BP 106/37 | HR 99 | Temp 97.4°F | Resp 16 | Ht 61.0 in | Wt 159.9 lb

## 2021-10-04 DIAGNOSIS — R5382 Chronic fatigue, unspecified: Secondary | ICD-10-CM | POA: Diagnosis not present

## 2021-10-04 DIAGNOSIS — M7918 Myalgia, other site: Secondary | ICD-10-CM | POA: Diagnosis not present

## 2021-10-04 DIAGNOSIS — Z79891 Long term (current) use of opiate analgesic: Secondary | ICD-10-CM | POA: Diagnosis not present

## 2021-10-04 DIAGNOSIS — G894 Chronic pain syndrome: Secondary | ICD-10-CM

## 2021-10-04 DIAGNOSIS — M47816 Spondylosis without myelopathy or radiculopathy, lumbar region: Secondary | ICD-10-CM

## 2021-10-04 DIAGNOSIS — M26629 Arthralgia of temporomandibular joint, unspecified side: Secondary | ICD-10-CM | POA: Diagnosis not present

## 2021-10-04 MED ORDER — OXYCODONE-ACETAMINOPHEN 5-325 MG PO TABS: 1.0000 | ORAL_TABLET | Freq: Four times a day (QID) | ORAL | 0 refills | Status: AC | PRN

## 2021-10-04 MED ORDER — OXYCODONE-ACETAMINOPHEN 5-325 MG PO TABS: 1.0000 | ORAL_TABLET | Freq: Four times a day (QID) | ORAL | 0 refills | Status: DC | PRN
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
## 2021-10-04 NOTE — Patient Instructions (Signed)
Med refill prior to 01/05/22

## 2021-10-04 NOTE — Progress Notes (Signed)
Nursing Pain Medication Assessment:  Safety precautions to be maintained throughout the outpatient stay will include: orient to surroundings, keep bed in low position, maintain call bell within reach at all times, provide assistance with transfer out of bed and ambulation.  Medication Inspection Compliance: Pill count conducted under aseptic conditions, in front of the patient. Neither the pills nor the bottle was removed from the patient's sight at any time. Once count was completed pills were immediately returned to the patient in their original bottle.  Medication: Oxycodone/APAP Pill/Patch Count:  1 120 Pill/Patch Appearance: Markings consistent with prescribed medication Bottle Appearance: Standard pharmacy container. Clearly labeled. Filled Date: 06 / 28 / 2023 Last Medication intake:  Today

## 2021-10-25 DIAGNOSIS — F900 Attention-deficit hyperactivity disorder, predominantly inattentive type: Secondary | ICD-10-CM | POA: Diagnosis not present

## 2021-10-25 DIAGNOSIS — F331 Major depressive disorder, recurrent, moderate: Secondary | ICD-10-CM | POA: Diagnosis not present

## 2021-10-25 DIAGNOSIS — R69 Illness, unspecified: Secondary | ICD-10-CM | POA: Diagnosis not present

## 2021-10-27 ENCOUNTER — Other Ambulatory Visit: Payer: Medicare HMO

## 2021-10-27 DIAGNOSIS — E039 Hypothyroidism, unspecified: Secondary | ICD-10-CM

## 2021-10-28 ENCOUNTER — Other Ambulatory Visit: Payer: Self-pay | Admitting: Family Medicine

## 2021-10-28 DIAGNOSIS — E039 Hypothyroidism, unspecified: Secondary | ICD-10-CM

## 2021-10-28 LAB — TSH: TSH: 0.515 u[IU]/mL (ref 0.450–4.500)

## 2021-10-28 MED ORDER — THYROID 15 MG PO TABS
45.0000 mg | ORAL_TABLET | Freq: Every day | ORAL | 1 refills | Status: DC
Start: 2021-10-28 — End: 2021-12-19

## 2021-11-03 ENCOUNTER — Other Ambulatory Visit: Payer: Self-pay | Admitting: Family Medicine

## 2021-11-03 NOTE — Telephone Encounter (Signed)
D/C 07/29/20. Requested Prescriptions  Refused Prescriptions Disp Refills  . Vitamin D, Ergocalciferol, (DRISDOL) 1.25 MG (50000 UNIT) CAPS capsule [Pharmacy Med Name: VITAMIN D (ERGOCALCIFEROL) 1.25 MG] 12 capsule 1    Sig: TAKE 1 CAPSULE BY MOUTH ONCE A WEEK     Endocrinology:  Vitamins - Vitamin D Supplementation 2 Failed - 11/03/2021  9:16 AM      Failed - Manual Review: Route requests for 50,000 IU strength to the provider      Passed - Ca in normal range and within 360 days    Calcium  Date Value Ref Range Status  07/27/2021 9.2 8.7 - 10.2 mg/dL Final   Calcium, Total  Date Value Ref Range Status  03/20/2013 8.9 8.5 - 10.1 mg/dL Final         Passed - Vitamin D in normal range and within 360 days    Vit D, 25-Hydroxy  Date Value Ref Range Status  07/27/2021 39.8 30.0 - 100.0 ng/mL Final    Comment:    Vitamin D deficiency has been defined by the Institute of Medicine and an Endocrine Society practice guideline as a level of serum 25-OH vitamin D less than 20 ng/mL (1,2). The Endocrine Society went on to further define vitamin D insufficiency as a level between 21 and 29 ng/mL (2). 1. IOM (Institute of Medicine). 2010. Dietary reference    intakes for calcium and D. Needles: The    [employer]. 2. Holick MF, Binkley [place], Bischoff-Ferrari HA, et al.    Evaluation, treatment, and prevention of vitamin D    deficiency: an Endocrine Society clinical practice    guideline. JCEM. 2011 Jul; 96(7):1911-30.          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, [place], DO      Future Appointments            In 2 months Wynetta Emery, Barb Merino, DO [facility]

## 2021-11-04 ENCOUNTER — Ambulatory Visit (INDEPENDENT_AMBULATORY_CARE_PROVIDER_SITE_OTHER): Payer: Medicare HMO | Admitting: Family Medicine

## 2021-11-04 ENCOUNTER — Encounter: Payer: Self-pay | Admitting: Family Medicine

## 2021-11-04 VITALS — BP 89/63 | HR 92 | Temp 98.4°F | Wt 154.1 lb

## 2021-11-04 DIAGNOSIS — M9905 Segmental and somatic dysfunction of pelvic region: Secondary | ICD-10-CM

## 2021-11-04 DIAGNOSIS — M9907 Segmental and somatic dysfunction of upper extremity: Secondary | ICD-10-CM

## 2021-11-04 DIAGNOSIS — M9909 Segmental and somatic dysfunction of abdomen and other regions: Secondary | ICD-10-CM

## 2021-11-04 DIAGNOSIS — M9903 Segmental and somatic dysfunction of lumbar region: Secondary | ICD-10-CM | POA: Diagnosis not present

## 2021-11-04 DIAGNOSIS — M9908 Segmental and somatic dysfunction of rib cage: Secondary | ICD-10-CM

## 2021-11-04 DIAGNOSIS — M546 Pain in thoracic spine: Secondary | ICD-10-CM

## 2021-11-04 DIAGNOSIS — M9900 Segmental and somatic dysfunction of head region: Secondary | ICD-10-CM | POA: Diagnosis not present

## 2021-11-04 DIAGNOSIS — G8929 Other chronic pain: Secondary | ICD-10-CM | POA: Diagnosis not present

## 2021-11-04 DIAGNOSIS — M9902 Segmental and somatic dysfunction of thoracic region: Secondary | ICD-10-CM | POA: Diagnosis not present

## 2021-11-04 DIAGNOSIS — M9901 Segmental and somatic dysfunction of cervical region: Secondary | ICD-10-CM | POA: Diagnosis not present

## 2021-11-04 DIAGNOSIS — M9904 Segmental and somatic dysfunction of sacral region: Secondary | ICD-10-CM | POA: Diagnosis not present

## 2021-11-04 NOTE — Progress Notes (Signed)
BP (!) 89/63   Pulse 92   Temp 98.4 F (36.9 C)   Wt 154 lb 1.6 oz (69.9 kg)   SpO2 100%   BMI 29.12 kg/m    Subjective:    Patient ID: Sharon Rodriguez, female    DOB: March 10, 1981, 41 y.o.   MRN: [id number]  HPI: KIMBERLYNN LUMBRA is a 42 y.o. female  Chief Complaint  Patient presents with   Back Pain   Devone presents today for evaluation and possible treatment with OMT for back pain. She notes that her shoulder, head and low back have been hurting since yesterday. Pain is more aching in nature. Nothing makes it better or worse. She lifted a case of water and that seems like it started the pain. No radiation. She is otherwise feeling well and has no other signs or symptoms at this time. No other concerns at this time.   Relevant past medical, surgical, family and social history reviewed and updated as indicated. Interim medical history since our last visit reviewed. Allergies and medications reviewed and updated.  Review of Systems  Constitutional: Negative.   Respiratory: Negative.    Cardiovascular: Negative.   Gastrointestinal: Negative.   Genitourinary: Negative.   Musculoskeletal:  Positive for arthralgias, back pain, myalgias, neck pain and neck stiffness. Negative for gait problem and joint swelling.  Skin: Negative.   Neurological:  Positive for headaches. Negative for dizziness, tremors, seizures, syncope, facial asymmetry, speech difficulty, weakness, light-headedness and numbness.  Psychiatric/Behavioral: Negative.      Per HPI unless specifically indicated above     Objective:    BP (!) 89/63   Pulse 92   Temp 98.4 F (36.9 C)   Wt 154 lb 1.6 oz (69.9 kg)   SpO2 100%   BMI 29.12 kg/m   Wt Readings from Last 3 Encounters:  11/04/21 154 lb 1.6 oz (69.9 kg)  10/04/21 159 lb 14.4 oz (72.5 kg)  08/29/21 162 lb 3.2 oz (73.6 kg)    Physical Exam Vitals and nursing note reviewed.  Constitutional:      General: She is not in acute distress.     Appearance: Normal appearance. She is obese. She is not ill-appearing.  HENT:     Head: Normocephalic and atraumatic.     Right Ear: External ear normal.     Left Ear: External ear normal.     Nose: Nose normal.     Mouth/Throat:     Mouth: Mucous membranes are moist.     Pharynx: Oropharynx is clear.  Eyes:     Extraocular Movements: Extraocular movements intact.     Conjunctiva/sclera: Conjunctivae normal.     Pupils: Pupils are equal, round, and reactive to light.  Neck:     Vascular: No carotid bruit.  Cardiovascular:     Rate and Rhythm: Normal rate.     Pulses: Normal pulses.  Pulmonary:     Effort: Pulmonary effort is normal. No respiratory distress.  Abdominal:     General: Abdomen is flat. There is no distension.     Palpations: Abdomen is soft. There is no mass.     Tenderness: There is no abdominal tenderness. There is no right CVA tenderness, left CVA tenderness, guarding or rebound.     Hernia: No hernia is present.  Musculoskeletal:     Cervical back: No muscular tenderness.  Lymphadenopathy:     Cervical: No cervical adenopathy.  Skin:    General: Skin is warm and dry.  Capillary Refill: Capillary refill takes less than 2 seconds.     Coloration: Skin is not jaundiced or pale.     Findings: No bruising, erythema, lesion or rash.  Neurological:     General: No focal deficit present.     Mental Status: She is alert. Mental status is at baseline.  Psychiatric:        Mood and Affect: Mood normal.        Behavior: Behavior normal.        Thought Content: Thought content normal.        Judgment: Judgment normal.    Musculoskeletal:  Exam found Decreased ROM, Tissue texture changes, Tenderness to palpation, and Asymmetry of patient's  head, neck, thorax, ribs, lumbar, pelvis, sacrum, upper extremity, and abdomen Osteopathic Structural Exam:   Head: OAESSR, hypertonic suboccipital muscles.   Neck: C 4ESRR, hypertonic paraspinals   Thorax: T4-7SRLL  Ribs:  Ribs 5-6 locked up on the R  Lumbar: L2-4SRRL, R iliopsoas hypertonic on the R, QL hypertonic on the R  Pelvis: anterior r innominate  Sacrum: R on R torsion  Upper Extremity: clavicle restricted on the R, pec hypertonic on the R, scapula restricted  Abdomen: diaphragm hypertonic on the R  Results for orders placed or performed in visit on 10/27/21  TSH  Result Value Ref Range   TSH 0.515 0.450 - 4.500 uIU/mL      Assessment & Plan:   Problem List Items Addressed This Visit   None Visit Diagnoses     Chronic bilateral thoracic back pain    -  Primary   She does have somatic dysfunction that is contributing to her symptoms. Treated today with good results as below. Call with any concerns.    Head region somatic dysfunction       Cervical segment dysfunction       Thoracic segment dysfunction       Rib cage region somatic dysfunction       Somatic dysfunction of lumbar region       Somatic dysfunction of sacral region       Somatic dysfunction of pelvis region       Segmental dysfunction of abdomen       Somatic dysfunction of upper extremities          After verbal consent was obtained, patient was treated today with osteopathic manipulative medicine to the regions of the head, neck, thorax, ribs, lumbar, pelvis, sacrum, abdomen, and upper extremity using the techniques of cranial, myofascial release, counterstrain, muscle energy, HVLA, and soft tissue. Areas of compensation relating to her primary pain source also treated. Patient tolerated the procedure well with good objective and good subjective improvement in symptoms. She left the room in good condition. She was advised to stay well hydrated and that she may have some soreness following the procedure. If not improving or worsening, she will call and come in. Home exercise program of stretches for SCM discussed and demonstrated today. Patient will do these stretches BID to before the point of pain, and will return for reevaluation   on a PRN basis.   Follow up plan: Return if symptoms worsen or fail to improve.

## 2021-11-07 DIAGNOSIS — E119 Type 2 diabetes mellitus without complications: Secondary | ICD-10-CM | POA: Diagnosis not present

## 2021-11-07 DIAGNOSIS — H5203 Hypermetropia, bilateral: Secondary | ICD-10-CM | POA: Diagnosis not present

## 2021-11-07 LAB — HM DIABETES EYE EXAM

## 2021-11-30 ENCOUNTER — Encounter: Payer: Self-pay | Admitting: Family Medicine

## 2021-12-05 NOTE — Telephone Encounter (Signed)
Called patient and scheduled for physical on 12/16/21

## 2021-12-16 ENCOUNTER — Encounter: Payer: Self-pay | Admitting: Family Medicine

## 2021-12-16 ENCOUNTER — Ambulatory Visit (INDEPENDENT_AMBULATORY_CARE_PROVIDER_SITE_OTHER): Payer: Medicare HMO | Admitting: Family Medicine

## 2021-12-16 VITALS — BP 88/59 | HR 96 | Ht 61.5 in | Wt 150.0 lb

## 2021-12-16 DIAGNOSIS — Z0000 Encounter for general adult medical examination without abnormal findings: Secondary | ICD-10-CM | POA: Diagnosis not present

## 2021-12-16 DIAGNOSIS — R69 Illness, unspecified: Secondary | ICD-10-CM | POA: Diagnosis not present

## 2021-12-16 DIAGNOSIS — F339 Major depressive disorder, recurrent, unspecified: Secondary | ICD-10-CM

## 2021-12-16 DIAGNOSIS — E1139 Type 2 diabetes mellitus with other diabetic ophthalmic complication: Secondary | ICD-10-CM

## 2021-12-16 DIAGNOSIS — E559 Vitamin D deficiency, unspecified: Secondary | ICD-10-CM | POA: Diagnosis not present

## 2021-12-16 DIAGNOSIS — E039 Hypothyroidism, unspecified: Secondary | ICD-10-CM

## 2021-12-16 LAB — URINALYSIS, ROUTINE W REFLEX MICROSCOPIC
Bilirubin, UA: NEGATIVE
Glucose, UA: NEGATIVE
Leukocytes,UA: NEGATIVE
Nitrite, UA: NEGATIVE
Protein,UA: NEGATIVE
RBC, UA: NEGATIVE
Specific Gravity, UA: >1.03 — ABNORMAL HIGH (ref 1.005–1.030)
Urobilinogen, Ur: 0.2 mg/dL (ref 0.2–1.0)
pH, UA: 6 (ref 5.0–7.5)

## 2021-12-16 LAB — MICROALBUMIN, URINE WAIVED
Creatinine, Urine Waived: 300 mg/dL (ref 10–300)
Microalb, Ur Waived: 30 mg/L — ABNORMAL HIGH (ref 0–19)
Microalb/Creat Ratio: <30 mg/g (ref <30)

## 2021-12-16 LAB — BAYER DCA HB A1C WAIVED: HB A1C (BAYER DCA - WAIVED): 5.3 % (ref 4.8–5.6)

## 2021-12-16 MED ORDER — ALBUTEROL SULFATE HFA 108 (90 BASE) MCG/ACT IN AERS: 1.0000 | INHALATION_SPRAY | Freq: Four times a day (QID) | RESPIRATORY_TRACT | 6 refills | Status: DC | PRN

## 2021-12-16 MED ORDER — OZEMPIC (1 MG/DOSE) 4 MG/3ML ~~LOC~~ SOPN: PEN_INJECTOR | SUBCUTANEOUS | 1 refills | Status: DC

## 2021-12-16 NOTE — Progress Notes (Signed)
BP (!) 88/59   Pulse 96   Ht 5' 1.5" (1.562 m)   Wt 150 lb (68 kg)   SpO2 97%   BMI 27.88 kg/m    Subjective:    Patient ID: Sharon Rodriguez, female    DOB: Feb 03, 1981, 41 y.o.   MRN: [id number]  HPI: Sharon Rodriguez is a 41 y.o. female presenting on 12/16/2021 for comprehensive medical examination. Current medical complaints include:  DIABETES Hypoglycemic episodes:no Polydipsia/polyuria: no Visual disturbance: no Chest pain: no Paresthesias: no Glucose Monitoring: no  Accucheck frequency: Not Checking Taking Insulin?: no Blood Pressure Monitoring: not checking Retinal Examination: Up to Date Foot Exam: Up to Date Diabetic Education: Completed Pneumovax: Up to Date Influenza:  Declined Aspirin: no  HYPOTHYROIDISM Thyroid control status:stable Satisfied with current treatment? yes Medication side effects: no Medication compliance: excellent compliance Recent dose adjustment:yes Fatigue: no Cold intolerance: no Heat intolerance: no Weight gain: no Weight loss: no Constipation: no Diarrhea/loose stools: no Palpitations: no Lower extremity edema: no Anxiety/depressed mood: no  She currently lives with: husband and kids Menopausal Symptoms: no  Depression Screen done today and results listed below:     12/16/2021    2:55 PM 07/27/2021    9:10 AM 04/26/2021    1:07 PM 01/20/2021    1:39 PM 10/29/2020    1:10 PM  Depression screen PHQ 2/9  Decreased Interest 1 2 2 3  0  Down, Depressed, Hopeless 0 2 1 2  0  PHQ - 2 Score 1 4 3 5  0  Altered sleeping 1 1 1 3 1   Tired, decreased energy 3 3 3 3 3   Change in appetite 0 1 1 3  0  Feeling bad or failure about yourself  0 1 1 3  0  Trouble concentrating 0 1 2 3 1   Moving slowly or fidgety/restless 2 1 0 3 0  Suicidal thoughts 0 0 0 0 0  PHQ-9 Score 7 12 11 23 5   Difficult doing work/chores Somewhat difficult   Very difficult Not difficult at all    Past Medical History:  Past Medical History:  Diagnosis Date    ADD (attention deficit disorder)    Anxiety    Asthma    WELL CONTROLLED   Biliary dyskinesia 12/27/2017   Chronic bronchitis (HCC)    Chronic fatigue    Chronic pain    Depression    Diabetes mellitus without complication (HCC)    Fibromyalgia    GERD (gastroesophageal reflux disease)    History of stomach ulcers 2019   Hypothyroidism 01/27/2015   Learning disability    Osteoarthritis    Rheumatoid arthritis (HCC)    Thyroid disease    TMJ (dislocation of temporomandibular joint)     Surgical History:  Past Surgical History:  Procedure Laterality Date   CESAREAN SECTION     x 2   CHOLECYSTECTOMY N/A 01/14/2018   Procedure: LAPAROSCOPIC CHOLECYSTECTOMY;  Surgeon: Vickie Epley, MD;  Location: [facility] ORS;  Service: General;  Laterality: N/A;   ESOPHAGOGASTRODUODENOSCOPY (EGD) WITH PROPOFOL N/A 12/07/2017   Procedure: ESOPHAGOGASTRODUODENOSCOPY (EGD) WITH PROPOFOL;  Surgeon: Jonathon Bellows, MD;  Location: [facility] ENDOSCOPY;  Service: Gastroenterology;  Laterality: N/A;    Medications:  Current Outpatient Medications on File Prior to Visit  Medication Sig   amphetamine-dextroamphetamine (ADDERALL) 30 MG tablet Take 1 tablet by mouth 2 (two) times daily.   EPINEPHrine (EPIPEN 2-PAK) 0.3 mg/0.3 mL IJ SOAJ injection Inject 0.3 mg into the muscle as needed for anaphylaxis (Pateint is  a Doctor, general practice.).   Insulin Pen Needle (PEN NEEDLES) 32G X 5 MM MISC 1 each by Does not apply route once a week.   oxyCODONE-acetaminophen (PERCOCET) 5-325 MG tablet Take 1 tablet by mouth every 6 (six) hours as needed for severe pain. Must last 30 days.   No current facility-administered medications on file prior to visit.    Allergies:  Allergies  Allergen Reactions   Ibuprofen Diarrhea and Nausea Only    Other Reaction: GI Upset   Metformin And Related Diarrhea   Promethazine Rash    Social History:  Social History   Socioeconomic History   Marital status: Married    Spouse name: Not on  file   Number of children: Not on file   Years of education: Not on file   Highest education level: Not on file  Occupational History   Not on file  Tobacco Use   Smoking status: Never   Smokeless tobacco: Never  Vaping Use   Vaping Use: Never used  Substance and Sexual Activity   Alcohol use: No   Drug use: No   Sexual activity: Not Currently    Birth control/protection: None  Other Topics Concern   Not on file  Social History Narrative   Not on file   Social Determinants of Health   Financial Resource Strain: Low Risk  (01/20/2021)   Overall Financial Resource Strain (CARDIA)    Difficulty of Paying Living Expenses: Not hard at all  Food Insecurity: No Food Insecurity (01/20/2021)   Hunger Vital Sign    Worried About Running Out of Food in the Last Year: Never true    [place] in the Last Year: Never true  Transportation Needs: No Transportation Needs (01/20/2021)   PRAPARE - Hydrologist (Medical): No    Lack of Transportation (Non-Medical): No  Physical Activity: Insufficiently Active (01/20/2021)   Exercise Vital Sign    Days of Exercise per Week: 2 days    Minutes of Exercise per Session: 20 min  Stress: Stress Concern Present (01/20/2021)   [place]    Feeling of Stress : To some extent  Social Connections: Unknown (01/20/2021)   Social Connection and Isolation Panel [NHANES]    Frequency of Communication with Friends and Family: Twice a week    Frequency of Social Gatherings with Friends and Family: Once a week    Attends Religious Services: More than 4 times per year    Active Member of [employer] or Organizations: No    Attends Archivist Meetings: Never    Marital Status: Not on file  Intimate Partner Violence: Not At Risk (08/08/2019)   Humiliation, Afraid, Rape, and Kick questionnaire    Fear of Current or Ex-Partner: No    Emotionally Abused: No     Physically Abused: No    Sexually Abused: No   Social History   Tobacco Use  Smoking Status Never  Smokeless Tobacco Never   Social History   Substance and Sexual Activity  Alcohol Use No    Family History:  Family History  Problem Relation Age of Onset   Diabetes Mother    ADD / ADHD Mother    Learning disabilities Mother    COPD Mother    Diabetes Father    Hyperlipidemia Father    Mental illness Father        Depression/anxiety   Mental illness Sister  Depression/Anxiety   Arthritis Brother        Rheumatoid   Mental illness Daughter        anxiety   Thyroid disease Daughter    ADD / ADHD Daughter    Allergies Daughter    Cancer Maternal Grandmother        Skin, Lung   Heart disease Maternal Grandfather    Stroke Maternal Grandfather    Heart attack Maternal Grandfather    Arthritis Paternal Grandmother        Rheumatoid   Dementia Paternal Grandmother    Allergies Daughter     Past medical history, surgical history, medications, allergies, family history and social history reviewed with patient today and changes made to appropriate areas of the chart.   Review of Systems  Constitutional: Negative.   HENT: Negative.    Eyes: Negative.   Respiratory: Negative.    Cardiovascular: Negative.   Gastrointestinal:  Positive for diarrhea and nausea. Negative for abdominal pain, blood in stool, constipation, heartburn, melena and vomiting.  Genitourinary: Negative.   Musculoskeletal:  Positive for joint pain. Negative for back pain, falls, myalgias and neck pain.  Skin: Negative.   Neurological: Negative.   Endo/Heme/Allergies: Negative.   Psychiatric/Behavioral: Negative.     All other ROS negative except what is listed above and in the HPI.      Objective:    BP (!) 88/59   Pulse 96   Ht 5' 1.5" (1.562 m)   Wt 150 lb (68 kg)   SpO2 97%   BMI 27.88 kg/m   Wt Readings from Last 3 Encounters:  12/16/21 150 lb (68 kg)  11/04/21 154 lb 1.6  oz (69.9 kg)  10/04/21 159 lb 14.4 oz (72.5 kg)    Physical Exam Vitals and nursing note reviewed.  Constitutional:      General: She is not in acute distress.    Appearance: Normal appearance. She is not ill-appearing, toxic-appearing or diaphoretic.  HENT:     Head: Normocephalic and atraumatic.     Right Ear: Tympanic membrane, ear canal and external ear normal. There is no impacted cerumen.     Left Ear: Tympanic membrane, ear canal and external ear normal. There is no impacted cerumen.     Nose: Nose normal. No congestion or rhinorrhea.     Mouth/Throat:     Mouth: Mucous membranes are moist.     Pharynx: Oropharynx is clear. No oropharyngeal exudate or posterior oropharyngeal erythema.  Eyes:     General: No scleral icterus.       Right eye: No discharge.        Left eye: No discharge.     Extraocular Movements: Extraocular movements intact.     Conjunctiva/sclera: Conjunctivae normal.     Pupils: Pupils are equal, round, and reactive to light.  Neck:     Vascular: No carotid bruit.  Cardiovascular:     Rate and Rhythm: Normal rate and regular rhythm.     Pulses: Normal pulses.     Heart sounds: No murmur heard.    No friction rub. No gallop.  Pulmonary:     Effort: Pulmonary effort is normal. No respiratory distress.     Breath sounds: Normal breath sounds. No stridor. No wheezing, rhonchi or rales.  Chest:     Chest wall: No tenderness.  Abdominal:     General: Abdomen is flat. Bowel sounds are normal. There is no distension.     Palpations: Abdomen is soft. There is  no mass.     Tenderness: There is no abdominal tenderness. There is no right CVA tenderness, left CVA tenderness, guarding or rebound.     Hernia: No hernia is present.  Genitourinary:    Comments: Breast and pelvic exams deferred with shared decision making Musculoskeletal:        General: No swelling, tenderness, deformity or signs of injury.     Cervical back: Normal range of motion and neck supple.  No rigidity. No muscular tenderness.     Right lower leg: No edema.     Left lower leg: No edema.  Lymphadenopathy:     Cervical: No cervical adenopathy.  Skin:    General: Skin is warm and dry.     Capillary Refill: Capillary refill takes less than 2 seconds.     Coloration: Skin is not jaundiced or pale.     Findings: No bruising, erythema, lesion or rash.  Neurological:     General: No focal deficit present.     Mental Status: She is alert and oriented to person, place, and time. Mental status is at baseline.     Cranial Nerves: No cranial nerve deficit.     Sensory: No sensory deficit.     Motor: No weakness.     Coordination: Coordination normal.     Gait: Gait normal.     Deep Tendon Reflexes: Reflexes normal.  Psychiatric:        Mood and Affect: Mood normal.        Behavior: Behavior normal.        Thought Content: Thought content normal.        Judgment: Judgment normal.     Results for orders placed or performed in visit on 12/16/21  CBC with Differential/Platelet  Result Value Ref Range   WBC 8.7 3.4 - 10.8 x10E3/uL   RBC 5.17 3.77 - 5.28 x10E6/uL   Hemoglobin 14.3 11.1 - 15.9 g/dL   Hematocrit 43.4 34.0 - 46.6 %   MCV 84 79 - 97 fL   MCH 27.7 26.6 - 33.0 pg   MCHC 32.9 31.5 - 35.7 g/dL   RDW 13.5 11.7 - 15.4 %   Platelets 274 150 - 450 x10E3/uL   Neutrophils 57 Not Estab. %   Lymphs 36 Not Estab. %   Monocytes 5 Not Estab. %   Eos 1 Not Estab. %   Basos 1 Not Estab. %   Neutrophils Absolute 5.0 1.4 - 7.0 x10E3/uL   Lymphocytes Absolute 3.1 0.7 - 3.1 x10E3/uL   Monocytes Absolute 0.5 0.1 - 0.9 x10E3/uL   EOS (ABSOLUTE) 0.1 0.0 - 0.4 x10E3/uL   Basophils Absolute 0.1 0.0 - 0.2 x10E3/uL   Immature Granulocytes 0 Not Estab. %   Immature Grans (Abs) 0.0 0.0 - 0.1 x10E3/uL  Comprehensive metabolic panel  Result Value Ref Range   Glucose 80 70 - 99 mg/dL   BUN 10 6 - 24 mg/dL   Creatinine, Ser 0.84 0.57 - 1.00 mg/dL   eGFR 88 >59 mL/min/1.73    BUN/Creatinine Ratio 12 9 - 23   Sodium 138 134 - 144 mmol/L   Potassium 4.0 3.5 - 5.2 mmol/L   Chloride 100 96 - 106 mmol/L   CO2 25 20 - 29 mmol/L   Calcium 9.5 8.7 - 10.2 mg/dL   Total Protein 7.6 6.0 - 8.5 g/dL   Albumin 4.6 3.9 - 4.9 g/dL   Globulin, Total 3.0 1.5 - 4.5 g/dL   Albumin/Globulin Ratio 1.5 1.2 - 2.2  Bilirubin Total 0.4 0.0 - 1.2 mg/dL   Alkaline Phosphatase 72 44 - 121 IU/L   AST 28 0 - 40 IU/L   ALT 14 0 - 32 IU/L  Lipid Panel w/o Chol/HDL Ratio  Result Value Ref Range   Cholesterol, Total 148 100 - 199 mg/dL   Triglycerides 157 (H) 0 - 149 mg/dL   HDL 36 (L) >39 mg/dL   VLDL Cholesterol Cal 27 5 - 40 mg/dL   LDL Chol Calc (NIH) 85 0 - 99 mg/dL  Urinalysis, Routine w reflex microscopic  Result Value Ref Range   Specific Gravity, UA >1.030 (H) 1.005 - 1.030   pH, UA 6.0 5.0 - 7.5   Color, UA Yellow Yellow   Appearance Ur Clear Clear   Leukocytes,UA Negative Negative   Protein,UA Negative Negative/Trace   Glucose, UA Negative Negative   Ketones, UA Trace (A) Negative   RBC, UA Negative Negative   Bilirubin, UA Negative Negative   Urobilinogen, Ur 0.2 0.2 - 1.0 mg/dL   Nitrite, UA Negative Negative  TSH  Result Value Ref Range   TSH 3.020 0.450 - 4.500 uIU/mL  Bayer DCA Hb A1c Waived  Result Value Ref Range   HB A1C (BAYER DCA - WAIVED) 5.3 4.8 - 5.6 %  Microalbumin, Urine Waived  Result Value Ref Range   Microalb, Ur Waived 30 (H) 0 - 19 mg/L   Creatinine, Urine Waived 300 10 - 300 mg/dL   Microalb/Creat Ratio <30 <30 mg/g  VITAMIN D 25 Hydroxy (Vit-D Deficiency, Fractures)  Result Value Ref Range   Vit D, 25-Hydroxy 27.6 (L) 30.0 - 100.0 ng/mL  T3, free  Result Value Ref Range   T3, Free 2.8 2.0 - 4.4 pg/mL      Assessment & Plan:   Problem List Items Addressed This Visit       Endocrine   Hypothyroidism    Rechecking labs today. Await results. Treat as needed.       Relevant Orders   CBC with Differential/Platelet (Completed)    TSH (Completed)   T3, free (Completed)   Diabetes mellitus ([place])    Doing well with A1c of 5.3. Continue current regimen. Continue to monitor. Refills given today.      Relevant Medications   Semaglutide, 1 MG/DOSE, (OZEMPIC, 1 MG/DOSE,) 4 MG/3ML SOPN   Other Relevant Orders   CBC with Differential/Platelet (Completed)   Comprehensive metabolic panel (Completed)   Lipid Panel w/o Chol/HDL Ratio (Completed)   Urinalysis, Routine w reflex microscopic (Completed)   Bayer DCA Hb A1c Waived (Completed)   Microalbumin, Urine Waived (Completed)     Other   Depression    Continue to follow with psychiatry. Call with any concerns.       Relevant Orders   CBC with Differential/Platelet (Completed)   Vitamin D deficiency    Rechecking labs today. Await results. Treat as needed.       Relevant Orders   CBC with Differential/Platelet (Completed)   VITAMIN D 25 Hydroxy (Vit-D Deficiency, Fractures) (Completed)   Other Visit Diagnoses     Routine general medical examination at a health care facility    -  Primary   Vaccines up to date/declined. Screening labs checked today. Pap and mammo up to date. Continue diet and exercise. Call with any concerns.         Follow up plan: Return in about 6 months (around 06/17/2022).   LABORATORY TESTING:  - Pap smear: up to date  IMMUNIZATIONS:   -  Tdap: Tetanus vaccination status reviewed: last tetanus booster within 10 years. - Influenza: Refused - Pneumovax: Up to date - Prevnar: Not applicable - COVID: Refused - HPV: Not applicable - Shingrix vaccine: Not applicable  SCREENING: -Mammogram: Up to date   PATIENT COUNSELING:   Advised to take 1 mg of folate supplement per day if capable of pregnancy.   Sexuality: Discussed sexually transmitted diseases, partner selection, use of condoms, avoidance of unintended pregnancy  and contraceptive alternatives.   Advised to avoid cigarette smoking.  I discussed with the patient that most  people either abstain from alcohol or drink within safe limits (<=14/week and <=4 drinks/occasion for males, <=7/weeks and <= 3 drinks/occasion for females) and that the risk for alcohol disorders and other health effects rises proportionally with the number of drinks per week and how often a drinker exceeds daily limits.  Discussed cessation/primary prevention of drug use and availability of treatment for abuse.   Diet: Encouraged to adjust caloric intake to maintain  or achieve ideal body weight, to reduce intake of dietary saturated fat and total fat, to limit sodium intake by avoiding high sodium foods and not adding table salt, and to maintain adequate dietary potassium and calcium preferably from fresh fruits, vegetables, and low-fat dairy products.    stressed the importance of regular exercise  Injury prevention: Discussed safety belts, safety helmets, smoke detector, smoking near bedding or upholstery.   Dental health: Discussed importance of regular tooth brushing, flossing, and dental visits.    NEXT PREVENTATIVE PHYSICAL DUE IN 1 YEAR. Return in about 6 months (around 06/17/2022).

## 2021-12-17 LAB — CBC WITH DIFFERENTIAL/PLATELET
Basophils Absolute: 0.1 10*3/uL (ref 0.0–0.2)
Basos: 1 %
EOS (ABSOLUTE): 0.1 10*3/uL (ref 0.0–0.4)
Eos: 1 %
Hematocrit: 43.4 % (ref 34.0–46.6)
Hemoglobin: 14.3 g/dL (ref 11.1–15.9)
Immature Grans (Abs): 0 10*3/uL (ref 0.0–0.1)
Immature Granulocytes: 0 %
Lymphocytes Absolute: 3.1 10*3/uL (ref 0.7–3.1)
Lymphs: 36 %
MCH: 27.7 pg (ref 26.6–33.0)
MCHC: 32.9 g/dL (ref 31.5–35.7)
MCV: 84 fL (ref 79–97)
Monocytes Absolute: 0.5 10*3/uL (ref 0.1–0.9)
Monocytes: 5 %
Neutrophils Absolute: 5 10*3/uL (ref 1.4–7.0)
Neutrophils: 57 %
Platelets: 274 10*3/uL (ref 150–450)
RBC: 5.17 x10E6/uL (ref 3.77–5.28)
RDW: 13.5 % (ref 11.7–15.4)
WBC: 8.7 10*3/uL (ref 3.4–10.8)

## 2021-12-17 LAB — COMPREHENSIVE METABOLIC PANEL
ALT: 14 IU/L (ref 0–32)
AST: 28 IU/L (ref 0–40)
Albumin/Globulin Ratio: 1.5 (ref 1.2–2.2)
Albumin: 4.6 g/dL (ref 3.9–4.9)
Alkaline Phosphatase: 72 IU/L (ref 44–121)
BUN/Creatinine Ratio: 12 (ref 9–23)
BUN: 10 mg/dL (ref 6–24)
Bilirubin Total: 0.4 mg/dL (ref 0.0–1.2)
CO2: 25 mmol/L (ref 20–29)
Calcium: 9.5 mg/dL (ref 8.7–10.2)
Chloride: 100 mmol/L (ref 96–106)
Creatinine, Ser: 0.84 mg/dL (ref 0.57–1.00)
Globulin, Total: 3 g/dL (ref 1.5–4.5)
Glucose: 80 mg/dL (ref 70–99)
Potassium: 4 mmol/L (ref 3.5–5.2)
Sodium: 138 mmol/L (ref 134–144)
Total Protein: 7.6 g/dL (ref 6.0–8.5)
eGFR: 88 mL/min/{1.73_m2} (ref >59)

## 2021-12-17 LAB — LIPID PANEL W/O CHOL/HDL RATIO
Cholesterol, Total: 148 mg/dL (ref 100–199)
HDL: 36 mg/dL — ABNORMAL LOW (ref >39)
LDL Chol Calc (NIH): 85 mg/dL (ref 0–99)
Triglycerides: 157 mg/dL — ABNORMAL HIGH (ref 0–149)
VLDL Cholesterol Cal: 27 mg/dL (ref 5–40)

## 2021-12-17 LAB — VITAMIN D 25 HYDROXY (VIT D DEFICIENCY, FRACTURES): Vit D, 25-Hydroxy: 27.6 ng/mL — ABNORMAL LOW (ref 30.0–100.0)

## 2021-12-17 LAB — T3, FREE: T3, Free: 2.8 pg/mL (ref 2.0–4.4)

## 2021-12-17 LAB — TSH: TSH: 3.02 u[IU]/mL (ref 0.450–4.500)

## 2021-12-19 ENCOUNTER — Other Ambulatory Visit: Payer: Self-pay | Admitting: Family Medicine

## 2021-12-19 MED ORDER — THYROID 15 MG PO TABS: 45.0000 mg | ORAL_TABLET | Freq: Every day | ORAL | 3 refills | Status: DC

## 2021-12-20 DIAGNOSIS — Z0100 Encounter for examination of eyes and vision without abnormal findings: Secondary | ICD-10-CM | POA: Diagnosis not present

## 2021-12-22 ENCOUNTER — Encounter: Payer: Self-pay | Admitting: Family Medicine

## 2021-12-22 NOTE — Assessment & Plan Note (Signed)
Rechecking labs today. Await results. Treat as needed.  °

## 2021-12-22 NOTE — Assessment & Plan Note (Signed)
Doing well with A1c of 5.3. Continue current regimen. Continue to monitor. Refills given today.

## 2021-12-22 NOTE — Assessment & Plan Note (Signed)
Continue to follow with psychiatry. Call with any concerns.  ?

## 2022-01-03 ENCOUNTER — Ambulatory Visit
Payer: Medicare HMO | Attending: Student in an Organized Health Care Education/Training Program | Admitting: Student in an Organized Health Care Education/Training Program

## 2022-01-03 ENCOUNTER — Encounter: Payer: Self-pay | Admitting: Student in an Organized Health Care Education/Training Program

## 2022-01-03 VITALS — BP 97/43 | HR 103 | Temp 97.2°F | Ht 61.0 in | Wt 157.0 lb

## 2022-01-03 DIAGNOSIS — M7918 Myalgia, other site: Secondary | ICD-10-CM

## 2022-01-03 DIAGNOSIS — M26629 Arthralgia of temporomandibular joint, unspecified side: Secondary | ICD-10-CM | POA: Diagnosis not present

## 2022-01-03 DIAGNOSIS — R5382 Chronic fatigue, unspecified: Secondary | ICD-10-CM | POA: Diagnosis not present

## 2022-01-03 DIAGNOSIS — Z79891 Long term (current) use of opiate analgesic: Secondary | ICD-10-CM | POA: Diagnosis not present

## 2022-01-03 DIAGNOSIS — G894 Chronic pain syndrome: Secondary | ICD-10-CM

## 2022-01-03 DIAGNOSIS — M47816 Spondylosis without myelopathy or radiculopathy, lumbar region: Secondary | ICD-10-CM

## 2022-01-03 MED ORDER — OXYCODONE-ACETAMINOPHEN 5-325 MG PO TABS: 1.0000 | ORAL_TABLET | Freq: Four times a day (QID) | ORAL | 0 refills | Status: AC | PRN

## 2022-01-03 MED ORDER — OXYCODONE-ACETAMINOPHEN 5-325 MG PO TABS: 1.0000 | ORAL_TABLET | Freq: Four times a day (QID) | ORAL | 0 refills | Status: DC | PRN
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
## 2022-01-03 NOTE — Progress Notes (Signed)
Nursing Pain Medication Assessment:  Safety precautions to be maintained throughout the outpatient stay will include: orient to surroundings, keep bed in low position, maintain call bell within reach at all times, provide assistance with transfer out of bed and ambulation.  Medication Inspection Compliance: Pill count conducted under aseptic conditions, in front of the patient. Neither the pills nor the bottle was removed from the patient's sight at any time. Once count was completed pills were immediately returned to the patient in their original bottle.  Medication: Hydrocodone/APAP Pill/Patch Count:  7 of 120 pills remain Pill/Patch Appearance: Markings consistent with prescribed medication Bottle Appearance: Standard pharmacy container. Clearly labeled. Filled Date: 30 / 26 / 2023 Last Medication intake:  TodaySafety precautions to be maintained throughout the outpatient stay will include: orient to surroundings, keep bed in low position, maintain call bell within reach at all times, provide assistance with transfer out of bed and ambulation.

## 2022-01-18 ENCOUNTER — Telehealth: Payer: Self-pay | Admitting: Family Medicine

## 2022-01-18 NOTE — Telephone Encounter (Signed)
Left message for patient to call back and schedule the Medicare Annual Wellness Visit (AWV) virtually or by telephone.  Last AWV 01/20/21  Please schedule at anytime with [facility]Nurse Health Advisor.    Any questions, please call me at [phone]

## 2022-01-23 ENCOUNTER — Encounter: Payer: Self-pay | Admitting: Physician Assistant

## 2022-01-23 ENCOUNTER — Encounter: Payer: Self-pay | Admitting: Family Medicine

## 2022-01-23 ENCOUNTER — Ambulatory Visit (INDEPENDENT_AMBULATORY_CARE_PROVIDER_SITE_OTHER): Payer: Medicare HMO | Admitting: Physician Assistant

## 2022-01-23 VITALS — BP 89/56 | HR 92 | Temp 98.8°F | Wt 153.0 lb

## 2022-01-23 DIAGNOSIS — J069 Acute upper respiratory infection, unspecified: Secondary | ICD-10-CM

## 2022-01-23 NOTE — Patient Instructions (Addendum)
Based on your described symptoms and the duration of symptoms it is likely that you have a viral upper respiratory infection (often called a "cold")  Symptoms can last for 3-10 days with lingering cough and intermittent symptoms lasting weeks after that.  The goal of treatment at this time is to reduce your symptoms and discomfort   You can use over the counter medications such as Dayquil/Nyquil, AlkaSeltzer formulations, etc to provide further relief of symptoms according to the manufacturer's instructions  If preferred you can use Coricidin to manage your symptoms rather than those medications mentioned above.   You can use throat sprays to help with the throat pain as well  If your symptoms do not improve or become worse in the next 5-7 days please make an apt at the office so we can see you  Go to the ER if you begin to have more serious symptoms such as shortness of breath, trouble breathing, loss of consciousness, swelling around the eyes, high fever, severe lasting headaches, vision changes or neck pain/stiffness.

## 2022-01-23 NOTE — Progress Notes (Signed)
Acute Office Visit   Patient: Sharon Rodriguez   DOB: Sep 03, 1980   41 y.o. Female  MRN: [id number] Visit Date: 01/23/2022  Today's healthcare provider: Dani Gobble Muath Hallam, PA-C  Introduced myself to the patient as a Journalist, newspaper and provided education on APPs in clinical practice.    Chief Complaint  Patient presents with   URI    Patient states she had a ST last Thursday and has felt like her voice is gone. Patient states she has a cough, and congestion. Patient took home COVID test today and it was negative.    Subjective    URI  Associated symptoms include congestion, coughing, headaches, rhinorrhea, sneezing and a sore throat. Pertinent negatives include no diarrhea, ear pain, nausea, sinus pain, vomiting or wheezing.   HPI     URI    Additional comments: Patient states she had a ST last Thursday and has felt like her voice is gone. Patient states she has a cough, and congestion. Patient took home COVID test today and it was negative.       Last edited by Louanna Raw, [place] on 01/23/2022  1:21 PM.        URI -type symptoms Sore throat, cough, nasal congestion, hoarse voice, sneezing, watery eyes   Onset: sudden  Duration: started Thursday- Friday with sore throat and coughing  Fever: no  Sore throat: yes  Nausea:no  Vomiting: no Diarrhea: no Chest pain: with coughing  SOB: no Productive cough: no  Headaches: mild  Dizziness: no Ear pain: no  Recent sick contacts: her daughter came home school with something similar  Alleviating: hot tea and honey seems to provide mild relief  Aggravating: nothing  Intervention: She has been taking herbal teas, no OTC or rx medications  COVID testing at home: completed today  Result: negative   Reports sleep is impacted by coughing   Medications: Outpatient Medications Prior to Visit  Medication Sig   albuterol (VENTOLIN HFA) 108 (90 Base) MCG/ACT inhaler Inhale 1 puff into the lungs every 6 (six) hours as needed for  wheezing or shortness of breath.   amphetamine-dextroamphetamine (ADDERALL) 30 MG tablet Take 1 tablet by mouth 2 (two) times daily.   EPINEPHrine (EPIPEN 2-PAK) 0.3 mg/0.3 mL IJ SOAJ injection Inject 0.3 mg into the muscle as needed for anaphylaxis (Pateint is a Doctor, general practice.).   Insulin Pen Needle (PEN NEEDLES) 32G X 5 MM MISC 1 each by Does not apply route once a week.   oxyCODONE-acetaminophen (PERCOCET) 5-325 MG tablet Take 1 tablet by mouth every 6 (six) hours as needed for severe pain. Must last 30 days.   [START ON 02/03/2022] oxyCODONE-acetaminophen (PERCOCET) 5-325 MG tablet Take 1 tablet by mouth every 6 (six) hours as needed for severe pain. Must last 30 days.   [START ON 03/05/2022] oxyCODONE-acetaminophen (PERCOCET) 5-325 MG tablet Take 1 tablet by mouth every 6 (six) hours as needed for severe pain. Must last 30 days.   Semaglutide, 1 MG/DOSE, (OZEMPIC, 1 MG/DOSE,) 4 MG/3ML SOPN INJECT '1MG'$  SUBCUTANEOUSLY ONCE WEEK   thyroid (ARMOUR THYROID) 15 MG tablet Take 3 tablets (45 mg total) by mouth daily.   No facility-administered medications prior to visit.    Review of Systems  Constitutional:  Negative for chills, fatigue and fever.  HENT:  Positive for congestion, postnasal drip, rhinorrhea, sneezing, sore throat and voice change. Negative for ear pain, sinus pressure and sinus pain.   Respiratory:  Positive for cough.  Negative for shortness of breath and wheezing.   Gastrointestinal:  Negative for constipation, diarrhea, nausea and vomiting.  Musculoskeletal:  Negative for myalgias.  Neurological:  Positive for headaches. Negative for dizziness and light-headedness.       Objective    BP (!) 89/56   Pulse 92   Temp 98.8 F (37.1 C)   Wt 153 lb (69.4 kg)   LMP 12/11/2021 (Approximate)   SpO2 98%   BMI 28.91 kg/m    Physical Exam Vitals reviewed.  Constitutional:      General: She is awake.     Appearance: Normal appearance. She is well-developed and well-groomed.   HENT:     Head: Normocephalic and atraumatic.     Right Ear: Tympanic membrane, ear canal and external ear normal.     Left Ear: Tympanic membrane, ear canal and external ear normal.     Mouth/Throat:     Pharynx: Oropharynx is clear. Uvula midline. Posterior oropharyngeal erythema present. No pharyngeal swelling, oropharyngeal exudate or uvula swelling.     Tonsils: No tonsillar exudate or tonsillar abscesses.     Comments: Voice is hoarse  Pulmonary:     Effort: Pulmonary effort is normal.     Breath sounds: Normal breath sounds. No decreased air movement. No decreased breath sounds, wheezing, rhonchi or rales.  Lymphadenopathy:     Head:     Right side of head: No submental, submandibular or preauricular adenopathy.     Left side of head: No submental, submandibular or preauricular adenopathy.     Cervical:     Right cervical: No superficial or posterior cervical adenopathy.    Left cervical: No superficial or posterior cervical adenopathy.  Neurological:     Mental Status: She is alert.  Psychiatric:        Attention and Perception: Attention and perception normal.        Mood and Affect: Mood and affect normal.        Behavior: Behavior normal. Behavior is cooperative.       No results found for any visits on 01/23/22.  Assessment & Plan      No follow-ups on file.       Problem List Items Addressed This Visit   None Visit Diagnoses     URI with cough and congestion    -  Primary Acute, new concern Visit with patient indicates symptoms comprised of congestion, sore throat, cough, hoarse voice since Friday congruent with acute URI that is likely viral in nature  Patient has tested negative for COVID at home   Due to nature and duration of symptoms recommended treatment regimen is symptomatic relief and follow up if needed Discussed with patient the various potential viral and bacterial etiologies of current illness and appropriate course of treatment Discussed OTC  medication options for multisymptom relief such as Dayquil/Nyquil, Theraflu, AlkaSeltzer, etc. Recommend she continue to use warm tea with honey, and can use lidocaine throat spray PRN for throat discomfort. Offered script for Tessalon pearls to assist with coughing but patient declined Discussed return precautions if symptoms are not improving or worsen over next 5-7 days.          No follow-ups on file.   I, Riggs Dineen E Tysin Salada, PA-C, have reviewed all documentation for this visit. The documentation on 01/23/22 for the exam, diagnosis, procedures, and orders are all accurate and complete.   Talitha Givens, MHS, PA-C [facility]

## 2022-01-27 ENCOUNTER — Ambulatory Visit: Payer: Medicare HMO | Admitting: Family Medicine

## 2022-02-16 DIAGNOSIS — F331 Major depressive disorder, recurrent, moderate: Secondary | ICD-10-CM | POA: Diagnosis not present

## 2022-02-16 DIAGNOSIS — F900 Attention-deficit hyperactivity disorder, predominantly inattentive type: Secondary | ICD-10-CM | POA: Diagnosis not present

## 2022-02-16 DIAGNOSIS — R69 Illness, unspecified: Secondary | ICD-10-CM | POA: Diagnosis not present

## 2022-02-23 ENCOUNTER — Encounter: Payer: Self-pay | Admitting: Family Medicine

## 2022-02-23 ENCOUNTER — Telehealth: Payer: Medicare HMO | Admitting: Family

## 2022-02-23 ENCOUNTER — Ambulatory Visit: Payer: Self-pay

## 2022-02-23 DIAGNOSIS — R6889 Other general symptoms and signs: Secondary | ICD-10-CM

## 2022-02-23 DIAGNOSIS — Z20828 Contact with and (suspected) exposure to other viral communicable diseases: Secondary | ICD-10-CM | POA: Diagnosis not present

## 2022-02-23 MED ORDER — OSELTAMIVIR PHOSPHATE 75 MG PO CAPS: 75.0000 mg | ORAL_CAPSULE | Freq: Two times a day (BID) | ORAL | 0 refills | Status: DC

## 2022-02-23 MED ORDER — BENZONATATE 100 MG PO CAPS: 100.0000 mg | ORAL_CAPSULE | Freq: Three times a day (TID) | ORAL | 0 refills | Status: DC | PRN

## 2022-02-23 NOTE — Progress Notes (Signed)
Virtual Visit Consent   [name], you are scheduled for a virtual visit with a [facility] provider today. Just as with appointments in the office, your consent must be obtained to participate. Your consent will be active for this visit and any virtual visit you may have with one of our providers in the next 365 days. If you have a MyChart account, a copy of this consent can be sent to you electronically.  As this is a virtual visit, video technology does not allow for your provider to perform a traditional examination. This may limit your provider's ability to fully assess your condition. If your provider identifies any concerns that need to be evaluated in person or the need to arrange testing (such as labs, EKG, etc.), we will make arrangements to do so. Although advances in technology are sophisticated, we cannot ensure that it will always work on either your end or our end. If the connection with a video visit is poor, the visit may have to be switched to a telephone visit. With either a video or telephone visit, we are not always able to ensure that we have a secure connection.  By engaging in this virtual visit, you consent to the provision of healthcare and authorize for your insurance to be billed (if applicable) for the services provided during this visit. Depending on your insurance coverage, you may receive a charge related to this service.  I need to obtain your verbal consent now. Are you willing to proceed with your visit today? Sharon Rodriguez has provided verbal consent on 02/23/2022 for a virtual visit (video or telephone). [name], FNP  Date: 02/23/2022 6:10 PM  Virtual Visit via Video Note   I, [name], connected with  Sharon Rodriguez  ([id number], 28-May-1980) on 02/23/22 at  6:15 PM EST by a video-enabled telemedicine application and verified that I am speaking with the correct person using two identifiers.  Location: Patient: Virtual Visit Location  Patient: Home Provider: Virtual Visit Location Provider: Home Office   I discussed the limitations of evaluation and management by telemedicine and the availability of in person appointments. The patient expressed understanding and agreed to proceed.    History of Present Illness: Sharon Rodriguez is a 41 y.o. who identifies as a female who was assigned female at birth, and is being seen today for flu like symptoms that started today. Her husband was seen today and diagnosed with Flu.   HPI: Influenza This is a new problem. The current episode started today. The problem has been unchanged. Associated symptoms include congestion, coughing, fatigue, headaches, myalgias and nausea. Pertinent negatives include no fever. She has tried acetaminophen for the symptoms. The treatment provided mild relief.    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/24/2017   Myofascial pain 09/24/2017   Pelvic pain in female 05/10/2017   Dysmenorrhea 05/10/2017   Pubic bone pain 05/10/2017   Controlled substance agreement signed 10/03/2016   Vitamin D deficiency 07/06/2016   Lower limb length difference 02/27/2015   Hypothyroidism 01/27/2015   Chronic pelvic pain in female 01/27/2015   Anxiety    Rheumatoid  arthritis ([place])    Depression    Fibromyalgia    TMJ pain dysfunction syndrome    Chronic bronchitis ([facility])    ADD (attention deficit disorder)    Learning disability    Acne 08/28/2011   Benign neoplasm of skin of trunk 08/28/2011   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    Allergies:  Allergies  Allergen  Reactions   Ibuprofen Diarrhea and Nausea Only    Other Reaction: GI Upset   Metformin And Related Diarrhea   Promethazine Rash   Medications:  Current Outpatient Medications:    albuterol (VENTOLIN HFA) 108 (90 Base) MCG/ACT inhaler, Inhale 1 puff into the lungs every 6 (six) hours as needed for wheezing or shortness of breath., Disp: 18 g, Rfl: 6   amphetamine-dextroamphetamine (ADDERALL) 30 MG tablet, Take 1 tablet by mouth 2 (two) times daily., Disp: , Rfl:    EPINEPHrine (EPIPEN 2-PAK) 0.3 mg/0.3 mL IJ SOAJ injection, Inject 0.3 mg into the muscle as needed for anaphylaxis (Pateint is a Doctor, general practice.)., Disp: 1 each, Rfl: 12   Insulin Pen Needle (PEN NEEDLES) 32G X 5 MM MISC, 1 each by Does not apply route once a week., Disp: 100 each, Rfl: 3   oxyCODONE-acetaminophen (PERCOCET) 5-325 MG tablet, Take 1 tablet by mouth every 6 (six) hours as needed for severe pain. Must last 30 days., Disp: 120 tablet, Rfl: 0   [START ON 03/05/2022] oxyCODONE-acetaminophen (PERCOCET) 5-325 MG tablet, Take 1 tablet by mouth every 6 (six) hours as needed for severe pain. Must last 30 days., Disp: 120 tablet, Rfl: 0   Semaglutide, 1 MG/DOSE, (OZEMPIC, 1 MG/DOSE,) 4 MG/3ML SOPN, INJECT '1MG'$  SUBCUTANEOUSLY ONCE WEEK, Disp: 9 mL, Rfl: 1   thyroid (ARMOUR THYROID) 15 MG tablet, Take 3 tablets (45 mg total) by mouth daily., Disp: 270 tablet, Rfl: 3  Observations/Objective: Patient is well-developed, well-nourished in no acute distress.  Resting comfortably  at home.  Head is normocephalic, atraumatic.  No labored breathing.  Speech is clear and coherent with logical content.  Patient is alert and oriented at baseline.  Nasal congestion  Assessment and Plan: 1. Flu-like symptoms  2. Exposure to influenza  Rest Force fluids Tylenol as needed  Droplet precautions  Follow up if symptoms worsen or do not improve   Follow Up Instructions: I discussed the assessment and treatment plan with the patient. The  patient was provided an opportunity to ask questions and all were answered. The patient agreed with the plan and demonstrated an understanding of the instructions.  A copy of instructions were sent to the patient via MyChart unless otherwise noted below.     The patient was advised to call back or seek an in-person evaluation if the symptoms worsen or if the condition fails to improve as anticipated.  Time:  I spent 5 minutes with the patient via telehealth technology discussing the above problems/concerns.    [name], FNP

## 2022-02-23 NOTE — Patient Instructions (Signed)

## 2022-02-23 NOTE — Telephone Encounter (Signed)
  Chief Complaint: Flu exposure - Flu s/s Symptoms: HA, BA, cough Frequency: today Pertinent Negatives: Patient denies  Disposition: '[]'$ ED /'[]'$ Urgent Care (no appt availability in office) / XAppointment(In office/virtual)/ '[]'$  [facility]/ '[]'$ Home Care/ '[]'$ Refused Recommended Disposition /'[]'$ [facility]/ '[]'$  Follow-up with PCP Additional Notes:  Pt called for tamiflu for herself and daughters. Husband tested + for Flu today. Made VV appt.    Reason for Disposition  [1] Patient is NOT HIGH RISK AND [2] strongly requests antiviral medicine AND [3] flu symptoms present < 48 hours  Answer Assessment - Initial Assessment Questions 1. WORST SYMPTOM: "What is your worst symptom?" (e.g., cough, runny nose, muscle aches, headache, sore throat, fever)      HA, BA cough 2. ONSET: "When did your flu symptoms start?"      today 3. COUGH: "How bad is the cough?"        4. RESPIRATORY DISTRESS: "Describe your breathing."       5. FEVER: "Do you have a fever?" If Yes, ask: "What is your temperature, how was it measured, and when did it start?"      6. EXPOSURE: "Were you exposed to someone with influenza?"       Husband tested + for Flu today 7. FLU VACCINE: "Did you get a flu shot this year?"      8. HIGH RISK DISEASE: "Do you have any chronic medical problems?" (e.g., heart or lung disease, asthma, weak immune system, or other HIGH RISK conditions)      9. PREGNANCY: "Is there any chance you are pregnant?" "When was your last menstrual period?"      10. OTHER SYMPTOMS: "Do you have any other symptoms?"  (e.g., runny nose, muscle aches, headache, sore throat)  Protocols used: Influenza - [facility]

## 2022-02-24 NOTE — Telephone Encounter (Signed)
I called the patient this morning and she said they saw someone virtually and got what they needed

## 2022-02-24 NOTE — Telephone Encounter (Signed)
Pt was seen virtually

## 2022-02-24 NOTE — Telephone Encounter (Signed)
OK to put virtual on- double book all 3, can do at 1:20

## 2022-03-02 ENCOUNTER — Other Ambulatory Visit (HOSPITAL_COMMUNITY): Payer: Self-pay

## 2022-03-30 ENCOUNTER — Ambulatory Visit
Payer: Medicare HMO | Attending: Student in an Organized Health Care Education/Training Program | Admitting: Student in an Organized Health Care Education/Training Program

## 2022-03-30 ENCOUNTER — Encounter: Payer: Self-pay | Admitting: Student in an Organized Health Care Education/Training Program

## 2022-03-30 VITALS — BP 105/53 | HR 80 | Temp 97.2°F | Resp 17 | Ht 61.0 in | Wt 155.0 lb

## 2022-03-30 DIAGNOSIS — R102 Pelvic and perineal pain unspecified side: Secondary | ICD-10-CM

## 2022-03-30 DIAGNOSIS — M797 Fibromyalgia: Secondary | ICD-10-CM

## 2022-03-30 DIAGNOSIS — M26629 Arthralgia of temporomandibular joint, unspecified side: Secondary | ICD-10-CM

## 2022-03-30 DIAGNOSIS — G894 Chronic pain syndrome: Secondary | ICD-10-CM | POA: Diagnosis not present

## 2022-03-30 DIAGNOSIS — M47816 Spondylosis without myelopathy or radiculopathy, lumbar region: Secondary | ICD-10-CM

## 2022-03-30 DIAGNOSIS — R5382 Chronic fatigue, unspecified: Secondary | ICD-10-CM | POA: Diagnosis not present

## 2022-03-30 MED ORDER — OXYCODONE-ACETAMINOPHEN 5-325 MG PO TABS: 1.0000 | ORAL_TABLET | Freq: Four times a day (QID) | ORAL | 0 refills | Status: DC | PRN

## 2022-03-30 MED ORDER — OXYCODONE-ACETAMINOPHEN 5-325 MG PO TABS: 1.0000 | ORAL_TABLET | Freq: Four times a day (QID) | ORAL | 0 refills | Status: AC | PRN

## 2022-03-30 NOTE — Progress Notes (Signed)
Nursing Pain Medication Assessment:  Safety precautions to be maintained throughout the outpatient stay will include: orient to surroundings, keep bed in low position, maintain call bell within reach at all times, provide assistance with transfer out of bed and ambulation.  Medication Inspection Compliance: Pill count conducted under aseptic conditions, in front of the patient. Neither the pills nor the bottle was removed from the patient's sight at any time. Once count was completed pills were immediately returned to the patient in their original bottle.  Medication: Oxycodone/APAP Pill/Patch Count:  19 of 120 pills remain Pill/Patch Appearance: Markings consistent with prescribed medication Bottle Appearance: Standard pharmacy container. Clearly labeled. Filled Date: 24 / 23 / 2023 Last Medication intake:  Today
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
## 2022-03-30 NOTE — Patient Instructions (Signed)
You have been referred for physical therapy

## 2022-04-10 ENCOUNTER — Other Ambulatory Visit: Payer: Self-pay | Admitting: Rheumatology

## 2022-04-10 DIAGNOSIS — Z8739 Personal history of other diseases of the musculoskeletal system and connective tissue: Secondary | ICD-10-CM

## 2022-04-10 DIAGNOSIS — M064 Inflammatory polyarthropathy: Secondary | ICD-10-CM

## 2022-04-10 DIAGNOSIS — M2550 Pain in unspecified joint: Secondary | ICD-10-CM

## 2022-04-10 DIAGNOSIS — M1380 Other specified arthritis, unspecified site: Secondary | ICD-10-CM

## 2022-04-20 ENCOUNTER — Encounter: Payer: Self-pay | Admitting: Family Medicine

## 2022-04-20 ENCOUNTER — Telehealth (INDEPENDENT_AMBULATORY_CARE_PROVIDER_SITE_OTHER): Payer: 59 | Admitting: Family Medicine

## 2022-04-20 DIAGNOSIS — R6889 Other general symptoms and signs: Secondary | ICD-10-CM | POA: Diagnosis not present

## 2022-04-20 MED ORDER — PREDNISONE 50 MG PO TABS: 50.0000 mg | ORAL_TABLET | Freq: Every day | ORAL | 0 refills | Status: DC

## 2022-04-20 MED ORDER — ONDANSETRON HCL 4 MG PO TABS: 4.0000 mg | ORAL_TABLET | Freq: Three times a day (TID) | ORAL | 0 refills | Status: DC | PRN

## 2022-04-20 MED ORDER — BENZONATATE 100 MG PO CAPS: 100.0000 mg | ORAL_CAPSULE | Freq: Three times a day (TID) | ORAL | 0 refills | Status: DC | PRN

## 2022-04-20 NOTE — Telephone Encounter (Signed)
Pt scheduled  

## 2022-04-20 NOTE — Progress Notes (Signed)
LMP 03/23/2022 (Exact Date)    Subjective:    Patient ID: Sharon Rodriguez, female    DOB: Dec 18, 1980, 42 y.o.   MRN: [id number]  HPI: Sharon Rodriguez is a 42 y.o. female  Chief Complaint  Patient presents with   Sore Throat    Patient says she became symptomatic yesterday. Patient says she has took an at-home COVID test and it was negative. Patient says she is having similar symptoms to when she tested positive for Flu in December.    Fever    Patient says she had a fever of 100.4 this morning, but has since slept that off since.    Headache   Cough   Generalized Body Aches   UPPER RESPIRATORY TRACT INFECTION Duration: yesterday Worst symptom: nausea, sore throat, headache Fever: yes Cough: yes Shortness of breath: no Wheezing: no Chest pain: no Chest tightness: yes Chest congestion: no Nasal congestion: yes Runny nose: yes Post nasal drip: yes Sneezing: yes Sore throat: yes Swollen glands: no Sinus pressure: yes Headache: yes Face pain: no Toothache: no Ear pain: no  Ear pressure: no  Eyes red/itching:no Eye drainage/crusting: no  Vomiting: no Rash: no Fatigue: yes Sick contacts: yes Strep contacts: no  Context: worse Recurrent sinusitis: no Relief with OTC cold/cough medications: no  Treatments attempted: ibuprofen, essential oils    Relevant past medical, surgical, family and social history reviewed and updated as indicated. Interim medical history since our last visit reviewed. Allergies and medications reviewed and updated.  Review of Systems  Constitutional:  Positive for chills, diaphoresis, fatigue and fever. Negative for activity change, appetite change and unexpected weight change.  HENT:  Positive for congestion, postnasal drip, rhinorrhea, sinus pressure and sore throat. Negative for dental problem, drooling, ear discharge, ear pain, facial swelling, hearing loss, mouth sores, nosebleeds, sinus pain, sneezing, tinnitus, trouble swallowing and  voice change.   Respiratory:  Positive for cough, chest tightness, shortness of breath and wheezing. Negative for apnea, choking and stridor.   Cardiovascular: Negative.   Gastrointestinal: Negative.   Musculoskeletal:  Positive for myalgias. Negative for arthralgias, back pain, gait problem, joint swelling, neck pain and neck stiffness.  Skin: Negative.   Psychiatric/Behavioral: Negative.      Per HPI unless specifically indicated above     Objective:    LMP 03/23/2022 (Exact Date)   Wt Readings from Last 3 Encounters:  03/30/22 155 lb (70.3 kg)  01/23/22 153 lb (69.4 kg)  01/03/22 157 lb (71.2 kg)    Physical Exam Vitals and nursing note reviewed.  Constitutional:      General: She is not in acute distress.    Appearance: Normal appearance. She is well-developed. She is not ill-appearing, toxic-appearing or diaphoretic.  HENT:     Head: Normocephalic and atraumatic.     Right Ear: External ear normal.     Left Ear: External ear normal.     Nose: Nose normal.     Mouth/Throat:     Mouth: Mucous membranes are moist.     Pharynx: Oropharynx is clear.  Eyes:     General: No scleral icterus.       Right eye: No discharge.        Left eye: No discharge.     Conjunctiva/sclera: Conjunctivae normal.     Pupils: Pupils are equal, round, and reactive to light.  Pulmonary:     Effort: Pulmonary effort is normal. No respiratory distress.     Comments: Speaking in full sentences Musculoskeletal:  General: Normal range of motion.     Cervical back: Normal range of motion.  Skin:    Coloration: Skin is not jaundiced or pale.     Findings: No bruising, erythema, lesion or rash.  Neurological:     Mental Status: She is alert and oriented to person, place, and time. Mental status is at baseline.  Psychiatric:        Mood and Affect: Mood normal.        Behavior: Behavior normal.        Thought Content: Thought content normal.        Judgment: Judgment normal.      Results for orders placed or performed in visit on 12/19/21  HM DIABETES EYE EXAM  Result Value Ref Range   HM Diabetic Eye Exam No Retinopathy No Retinopathy      Assessment & Plan:   Problem List Items Addressed This Visit   None Visit Diagnoses     Flu-like symptoms    -  Primary   Will treat with zofran and tessalon perles and prednisone. Will swab tomorrow. Await results.   Relevant Medications   benzonatate (TESSALON PERLES) 100 MG capsule   Other Relevant Orders   Respiratory Panel w/ SARS-CoV2        Follow up plan: Return if symptoms worsen or fail to improve.   This visit was completed via video visit through MyChart due to the restrictions of the COVID-19 pandemic. All issues as above were discussed and addressed. Physical exam was done as above through visual confirmation on video through MyChart. If it was felt that the patient should be evaluated in the office, they were directed there. The patient verbally consented to this visit. Location of the patient: home Location of the provider: work Those involved with this call:  Provider: Park Liter, DO CMA: Irena Reichmann, [place] Desk/Registration: [employer]  Time spent on call:  15 minutes with patient face to face via video conference. More than 50% of this time was spent in counseling and coordination of care. 23 minutes total spent in review of patient's record and preparation of their chart.

## 2022-04-21 ENCOUNTER — Other Ambulatory Visit: Payer: Self-pay

## 2022-04-21 ENCOUNTER — Encounter: Payer: Self-pay | Admitting: Family Medicine

## 2022-04-21 DIAGNOSIS — E039 Hypothyroidism, unspecified: Secondary | ICD-10-CM

## 2022-04-21 DIAGNOSIS — E1139 Type 2 diabetes mellitus with other diabetic ophthalmic complication: Secondary | ICD-10-CM

## 2022-04-21 MED ORDER — ONETOUCH ULTRASOFT LANCETS MISC: 1.0000 | Freq: Every day | 3 refills | Status: AC

## 2022-04-21 MED ORDER — ONETOUCH ULTRA 2 W/DEVICE KIT: 1.0000 | PACK | Freq: Every day | 0 refills | Status: AC

## 2022-04-21 MED ORDER — ONETOUCH ULTRA VI STRP: 1.0000 | ORAL_STRIP | Freq: Every day | 3 refills | Status: AC

## 2022-04-22 LAB — RESPIRATORY PANEL W/ SARS-COV2

## 2022-04-24 ENCOUNTER — Encounter: Payer: Self-pay | Admitting: Family Medicine

## 2022-04-24 NOTE — Telephone Encounter (Signed)
If Sharon Rodriguez can't find out what's happening, please offer patient opportunity to be reswabbed

## 2022-05-25 ENCOUNTER — Telehealth: Payer: Self-pay | Admitting: Family Medicine

## 2022-05-25 NOTE — Telephone Encounter (Signed)
Contacted Sharon Rodriguez to schedule their annual wellness visit. Appointment made for 06/20/2022.  Sherol Dade; Care Guide Ambulatory Clinical [facility] Direct Dial: [phone]

## 2022-06-19 ENCOUNTER — Ambulatory Visit: Payer: 59 | Admitting: Family Medicine

## 2022-06-20 ENCOUNTER — Ambulatory Visit (INDEPENDENT_AMBULATORY_CARE_PROVIDER_SITE_OTHER): Payer: 59

## 2022-06-20 VITALS — Ht 61.0 in | Wt 155.0 lb

## 2022-06-20 DIAGNOSIS — Z0000 Encounter for general adult medical examination without abnormal findings: Secondary | ICD-10-CM

## 2022-06-20 DIAGNOSIS — Z1231 Encounter for screening mammogram for malignant neoplasm of breast: Secondary | ICD-10-CM

## 2022-06-20 NOTE — Progress Notes (Signed)
I connected with  Sharon Rodriguez on 06/20/22 by a audio enabled telemedicine application and verified that I am speaking with the correct person using two identifiers.  Patient Location: Home  Provider Location: Office/Clinic  I discussed the limitations of evaluation and management by telemedicine. The patient expressed understanding and agreed to proceed.  Subjective:   Sharon Rodriguez is a 42 y.o. female who presents for Medicare Annual (Subsequent) preventive examination.  Review of Systems     Cardiac Risk Factors include: advanced age (>12men, >58 women);diabetes mellitus     Objective:    There were no vitals filed for this visit. There is no height or weight on file to calculate BMI.     06/20/2022    1:33 PM 03/30/2022   10:54 AM 01/03/2022   10:26 AM 01/20/2021    1:10 PM 11/14/2020    3:31 PM 10/12/2020   10:32 AM 01/14/2018    8:28 AM  Advanced Directives  Does Patient Have a Medical Advance Directive? No No No No No No No  Would patient like information on creating a medical advance directive? No - Patient declined Yes (MAU/Ambulatory/Procedural Areas - Information given) No - Patient declined No - Patient declined   No - Patient declined    Current Medications (verified) Outpatient Encounter Medications as of 06/20/2022  Medication Sig   albuterol (VENTOLIN HFA) 108 (90 Base) MCG/ACT inhaler Inhale 1 puff into the lungs every 6 (six) hours as needed for wheezing or shortness of breath.   amphetamine-dextroamphetamine (ADDERALL) 30 MG tablet Take 1 tablet by mouth 2 (two) times daily.   Blood Glucose Monitoring Suppl (ONE TOUCH ULTRA 2) w/Device KIT 1 each by Does not apply route daily.   EPINEPHrine (EPIPEN 2-PAK) 0.3 mg/0.3 mL IJ SOAJ injection Inject 0.3 mg into the muscle as needed for anaphylaxis (Pateint is a Systems developer.).   glucose blood (ONETOUCH ULTRA) test strip 1 each by Other route daily. Use as instructed   Insulin Pen Needle (PEN NEEDLES) 32G X 5 MM  MISC 1 each by Does not apply route once a week.   Lancets (ONETOUCH ULTRASOFT) lancets 1 each by Other route daily. Use as instructed   ondansetron (ZOFRAN) 4 MG tablet Take 1 tablet (4 mg total) by mouth every 8 (eight) hours as needed for nausea or vomiting.   oxyCODONE-acetaminophen (PERCOCET) 5-325 MG tablet Take 1 tablet by mouth every 6 (six) hours as needed for severe pain. Must last 30 days.   Semaglutide, 1 MG/DOSE, (OZEMPIC, 1 MG/DOSE,) 4 MG/3ML SOPN INJECT 1MG  SUBCUTANEOUSLY ONCE WEEK   thyroid (ARMOUR THYROID) 15 MG tablet Take 3 tablets (45 mg total) by mouth daily.   benzonatate (TESSALON PERLES) 100 MG capsule Take 1 capsule (100 mg total) by mouth 3 (three) times daily as needed. (Patient not taking: Reported on 06/20/2022)   predniSONE (DELTASONE) 50 MG tablet Take 1 tablet (50 mg total) by mouth daily with breakfast. (Patient not taking: Reported on 06/20/2022)   No facility-administered encounter medications on file as of 06/20/2022.    Allergies (verified) Ibuprofen, Metformin and related, and Promethazine   History: Past Medical History:  Diagnosis Date   ADD (attention deficit disorder)    Anxiety    Asthma    WELL CONTROLLED   Biliary dyskinesia 12/27/2017   Chronic bronchitis    Chronic fatigue    Chronic pain    Depression    Diabetes mellitus without complication    Fibromyalgia    GERD (gastroesophageal  reflux disease)    History of stomach ulcers 2019   Hypothyroidism 01/27/2015   Learning disability    Osteoarthritis    Rheumatoid arthritis    Thyroid disease    TMJ (dislocation of temporomandibular joint)    Past Surgical History:  Procedure Laterality Date   CESAREAN SECTION     x 2   CHOLECYSTECTOMY N/A 01/14/2018   Procedure: LAPAROSCOPIC CHOLECYSTECTOMY;  Surgeon: Ancil Linsey, MD;  Location: [facility] ORS;  Service: General;  Laterality: N/A;   ESOPHAGOGASTRODUODENOSCOPY (EGD) WITH PROPOFOL N/A 12/07/2017   Procedure: ESOPHAGOGASTRODUODENOSCOPY  (EGD) WITH PROPOFOL;  Surgeon: Wyline Mood, MD;  Location: [facility] ENDOSCOPY;  Service: Gastroenterology;  Laterality: N/A;   Family History  Problem Relation Age of Onset   Diabetes Mother    ADD / ADHD Mother    Learning disabilities Mother    COPD Mother    Diabetes Father    Hyperlipidemia Father    Mental illness Father        Depression/anxiety   Mental illness Sister        Depression/Anxiety   Arthritis Brother        Rheumatoid   Mental illness Daughter        anxiety   Thyroid disease Daughter    ADD / ADHD Daughter    Allergies Daughter    Cancer Maternal Grandmother        Skin, Lung   Heart disease Maternal Grandfather    Stroke Maternal Grandfather    Heart attack Maternal Grandfather    Arthritis Paternal Grandmother        Rheumatoid   Dementia Paternal Grandmother    Allergies Daughter    Social History   Socioeconomic History   Marital status: Married    Spouse name: Not on file   Number of children: Not on file   Years of education: Not on file   Highest education level: Not on file  Occupational History   Not on file  Tobacco Use   Smoking status: Never   Smokeless tobacco: Never  Vaping Use   Vaping Use: Never used  Substance and Sexual Activity   Alcohol use: No   Drug use: No   Sexual activity: Not Currently    Birth control/protection: None  Other Topics Concern   Not on file  Social History Narrative   Not on file   Social Determinants of Health   Financial Resource Strain: Low Risk  (06/20/2022)   Overall Financial Resource Strain (CARDIA)    Difficulty of Paying Living Expenses: Not very hard  Food Insecurity: No Food Insecurity (06/20/2022)   Hunger Vital Sign    Worried About Running Out of Food in the Last Year: Never true    Ran Out of Food in the Last Year: Never true  Transportation Needs: No Transportation Needs (06/20/2022)   PRAPARE - Administrator, Civil Service (Medical): No    Lack of Transportation  (Non-Medical): No  Physical Activity: Insufficiently Active (06/20/2022)   Exercise Vital Sign    Days of Exercise per Week: 2 days    Minutes of Exercise per Session: 20 min  Stress: No Stress Concern Present (06/20/2022)   [employer] of Occupational Health - Occupational Stress Questionnaire    Feeling of Stress : Only a little  Social Connections: Socially Integrated (06/20/2022)   Social Connection and Isolation Panel [NHANES]    Frequency of Communication with Friends and Family: More than three times a week  Frequency of Social Gatherings with Friends and Family: Never    Attends Religious Services: More than 4 times per year    Active Member of Clubs or Organizations: Yes    Attends Engineer, structuralClub or Organization Meetings: More than 4 times per year    Marital Status: Married    Tobacco Counseling Counseling given: Not Answered   Clinical Intake:  Pre-visit preparation completed: Yes  Pain : No/denies pain     Nutritional Risks: None Diabetes: Yes CBG done?: No Did pt. bring in CBG monitor from home?: No  How often do you need to have someone help you when you read instructions, pamphlets, or other written materials from your doctor or pharmacy?: 1 - Never  Diabetic?yes Nutrition Risk Assessment:  Has the patient had any N/V/D within the last 2 months?  Yes  Does the patient have any non-healing wounds?  No  Has the patient had any unintentional weight loss or weight gain?  No   Diabetes:  Is the patient diabetic?  Yes  If diabetic, was a CBG obtained today?  No  Did the patient bring in their glucometer from home?  No  How often do you monitor your CBG's? occasionally.   Financial Strains and Diabetes Management:  Are you having any financial strains with the device, your supplies or your medication? No .  Does the patient want to be seen by Chronic Care Management for management of their diabetes?  No  Would the patient like to be referred to a Nutritionist or  for Diabetic Management?  No   Diabetic Exams:  Diabetic Eye Exam: Completed 11/07/21.  Pt has been advised about the importance in completing this exam.  Diabetic Foot Exam: Completed 07/27/21. Pt has been advised about the importance in completing this exam.   Interpreter Needed?: No  Information entered by :: Kennedy BuckerLorrie Haruki Arnold, LPN   Activities of Daily Living    06/20/2022    1:35 PM  In your present state of health, do you have any difficulty performing the following activities:  Hearing? 0  Vision? 0  Difficulty concentrating or making decisions? 0  Walking or climbing stairs? 1  Dressing or bathing? 0  Doing errands, shopping? 0  Preparing Food and eating ? N  Using the Toilet? N  In the past six months, have you accidently leaked urine? N  Do you have problems with loss of bowel control? N  Managing your Medications? N  Managing your Finances? N  Housekeeping or managing your Housekeeping? N    Patient Care Team: Dorcas CarrowJohnson, Megan P, DO as PCP - General (Family Medicine) Hulda HumphreyBaran, Elizabeth, CRNP as Nurse Practitioner (Rheumatology) Lynett FishSu, Hansen, MD as Referring Physician (Psychiatry) Gustavus BryantJoyce, Brooke L, LCSW as [facility] Care Management (Licensed Clinical Social Worker)  Indicate any recent Medical Services you may have received from other than [facility] providers in the past year (date may be approximate).     Assessment:   This is a routine wellness examination for MariposaJennifer.  Hearing/Vision screen Hearing Screening - Comments:: No aids [facility] - [facility]:: [facility]  Dietary issues and exercise activities discussed: Current Exercise Habits: Home exercise routine, Type of exercise: walking, Time (Minutes): 20, Frequency (Times/Week): 2, Weekly Exercise (Minutes/Week): 40, Intensity: Mild   Goals Addressed             This Visit's Progress    DIET - EAT MORE FRUITS AND VEGETABLES         Depression Screen  06/20/2022     1:31 PM 01/03/2022   10:25 AM 12/16/2021    2:55 PM 07/27/2021    9:10 AM 04/26/2021    1:07 PM 01/20/2021    1:39 PM 10/29/2020    1:10 PM  PHQ 2/9 Scores  PHQ - 2 Score 0 0 0  PHQ- 9 Score 0  Fall Risk    06/20/2022    1:34 PM 03/30/2022   10:54 AM 01/03/2022   10:25 AM 10/04/2021   10:39 AM 07/05/2021   10:30 AM  Fall Risk   Falls in the past year? 0 0 0 0 0  Number falls in past yr: 0      Injury with Fall? 0      Risk for fall due to : No Fall Risks      Follow up Falls prevention discussed;Falls evaluation completed        FALL RISK PREVENTION PERTAINING TO THE HOME:  Any stairs in or around the home? Yes  If so, are there any without handrails? No  Home free of loose throw rugs in walkways, pet beds, electrical cords, etc? Yes  Adequate lighting in your home to reduce risk of falls? Yes   ASSISTIVE DEVICES UTILIZED TO PREVENT FALLS:  Life alert? No  Use of a cane, walker or w/c? No  Grab bars in the bathroom? No  Shower chair or bench in shower? Yes  Elevated toilet seat or a handicapped toilet? Yes    Cognitive Function:        06/20/2022    1:42 PM 06/25/2017    1:38 PM  6CIT Screen  What Year? 0 points 0 points  What month? 0 points 0 points  What time? 0 points 0 points  Count back from 20 0 points 0 points  Months in reverse 0 points 0 points  Repeat phrase 0 points 2 points  Total Score 0 points 2 points    Immunizations Immunization History  Administered Date(s) Administered   Influenza,inj,Quad PF,6+ Mos 01/05/2016, 12/05/2016, 01/24/2018, 01/01/2020   Influenza-Unspecified 01/05/2016   Pneumococcal Polysaccharide-23 01/01/2020   Tdap 01/05/2016    TDAP status: Up to date  Flu Vaccine status: Declined, Education has been provided regarding the importance of this vaccine but patient still declined. Advised may receive this vaccine at local pharmacy or Health Dept. Aware to provide a copy of the vaccination record if  obtained from local pharmacy or Health Dept. Verbalized acceptance and understanding.  Pneumococcal vaccine status: Up to date  Covid-19 vaccine status: Declined, Education has been provided regarding the importance of this vaccine but patient still declined. Advised may receive this vaccine at local pharmacy or Health Dept.or vaccine clinic. Aware to provide a copy of the vaccination record if obtained from local pharmacy or Health Dept. Verbalized acceptance and understanding.  Qualifies for Shingles Vaccine? No   Zostavax completed No   Shingrix Completed?: No.    Education has been provided regarding the importance of this vaccine. Patient has been advised to call insurance company to determine out of pocket expense if they have not yet received this vaccine. Advised may also receive vaccine at local pharmacy or Health Dept. Verbalized acceptance and understanding.  Screening Tests Health Maintenance  Topic Date Due   COVID-19 Vaccine (1) Never done   PAP SMEAR-Modifier  03/28/2022   HEMOGLOBIN A1C  06/17/2022   FOOT EXAM  07/28/2022   INFLUENZA VACCINE  10/12/2022   OPHTHALMOLOGY EXAM  11/08/2022   Diabetic kidney evaluation - eGFR measurement  12/17/2022   Diabetic kidney evaluation - Urine ACR  12/17/2022   Medicare Annual Wellness (AWV)  06/20/2023   DTaP/Tdap/Td (2 - Td or Tdap) 01/04/2026   Hepatitis C Screening  Completed   HIV Screening  Addressed   HPV VACCINES  Aged Out    Health Maintenance  Health Maintenance Due  Topic Date Due   COVID-19 Vaccine (1) Never done   PAP SMEAR-Modifier  03/28/2022   HEMOGLOBIN A1C  06/17/2022      Mammogram status: Ordered 06/20/22. Pt provided with contact info and advised to call to schedule appt.   Lung Cancer Screening: (Low Dose CT Chest recommended if Age 39-80 years, 30 pack-year currently smoking OR have quit w/in 15years.) does not qualify.    Additional Screening:  Hepatitis C Screening: does qualify; Completed  04/16/20  Vision Screening: Recommended annual ophthalmology exams for early detection of glaucoma and other disorders of the eye. Is the patient up to date with their annual eye exam?  Yes  Who is the provider or what is the name of the office in which the patient attends annual eye exams? [facility] If pt is not established with a provider, would they like to be referred to a provider to establish care? No .   Dental Screening: Recommended annual dental exams for proper oral hygiene  Community Resource Referral / Chronic Care Management: CRR required this visit?  No   CCM required this visit?  No      Plan:     I have personally reviewed and noted the following in the patient's chart:   Medical and social history Use of alcohol, tobacco or illicit drugs  Current medications and supplements including opioid prescriptions. Patient is not currently taking opioid prescriptions. Functional ability and status Nutritional status Physical activity Advanced directives List of other physicians Hospitalizations, surgeries, and ER visits in previous 12 months Vitals Screenings to include cognitive, depression, and falls Referrals and appointments  In addition, I have reviewed and discussed with patient certain preventive protocols, quality metrics, and best practice recommendations. A written personalized care plan for preventive services as well as general preventive health recommendations were provided to patient.     Hal Hope, LPN   05/13/9922   Nurse Notes: none

## 2022-06-27 ENCOUNTER — Ambulatory Visit
Payer: 59 | Attending: Student in an Organized Health Care Education/Training Program | Admitting: Student in an Organized Health Care Education/Training Program

## 2022-06-27 ENCOUNTER — Encounter: Payer: Self-pay | Admitting: Student in an Organized Health Care Education/Training Program

## 2022-06-27 VITALS — BP 108/42 | HR 88 | Temp 98.2°F | Resp 16 | Ht 61.0 in | Wt 155.0 lb

## 2022-06-27 DIAGNOSIS — M26629 Arthralgia of temporomandibular joint, unspecified side: Secondary | ICD-10-CM | POA: Insufficient documentation

## 2022-06-27 DIAGNOSIS — M47816 Spondylosis without myelopathy or radiculopathy, lumbar region: Secondary | ICD-10-CM | POA: Diagnosis not present

## 2022-06-27 DIAGNOSIS — M797 Fibromyalgia: Secondary | ICD-10-CM | POA: Insufficient documentation

## 2022-06-27 DIAGNOSIS — R5382 Chronic fatigue, unspecified: Secondary | ICD-10-CM | POA: Insufficient documentation

## 2022-06-27 DIAGNOSIS — G894 Chronic pain syndrome: Secondary | ICD-10-CM | POA: Diagnosis not present

## 2022-06-27 MED ORDER — OXYCODONE-ACETAMINOPHEN 5-325 MG PO TABS: 1.0000 | ORAL_TABLET | Freq: Four times a day (QID) | ORAL | 0 refills | Status: AC | PRN

## 2022-06-27 MED ORDER — OXYCODONE-ACETAMINOPHEN 5-325 MG PO TABS: 1.0000 | ORAL_TABLET | Freq: Four times a day (QID) | ORAL | 0 refills | Status: DC | PRN

## 2022-06-27 NOTE — Progress Notes (Signed)
Nursing Pain Medication Assessment:  Safety precautions to be maintained throughout the outpatient stay will include: orient to surroundings, keep bed in low position, maintain call bell within reach at all times, provide assistance with transfer out of bed and ambulation.  Medication Inspection Compliance: Pill count conducted under aseptic conditions, in front of the patient. Neither the pills nor the bottle was removed from the patient's sight at any time. Once count was completed pills were immediately returned to the patient in their original bottle.  Medication: Oxycodone/APAP Pill/Patch Count:  19 of 120 pills remain Pill/Patch Appearance: Markings consistent with prescribed medication Bottle Appearance: Standard pharmacy container. Clearly labeled. Filled Date: 03 / 22 / 2024 Last Medication intake:  Today
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
## 2022-06-30 ENCOUNTER — Ambulatory Visit: Payer: 59 | Attending: Family Medicine

## 2022-06-30 ENCOUNTER — Encounter: Payer: Self-pay | Admitting: Family Medicine

## 2022-06-30 ENCOUNTER — Ambulatory Visit (INDEPENDENT_AMBULATORY_CARE_PROVIDER_SITE_OTHER): Payer: 59 | Admitting: Family Medicine

## 2022-06-30 VITALS — BP 123/66 | HR 103 | Temp 98.4°F | Ht 61.0 in | Wt 156.0 lb

## 2022-06-30 DIAGNOSIS — F988 Other specified behavioral and emotional disorders with onset usually occurring in childhood and adolescence: Secondary | ICD-10-CM

## 2022-06-30 DIAGNOSIS — E559 Vitamin D deficiency, unspecified: Secondary | ICD-10-CM

## 2022-06-30 DIAGNOSIS — M069 Rheumatoid arthritis, unspecified: Secondary | ICD-10-CM

## 2022-06-30 DIAGNOSIS — R002 Palpitations: Secondary | ICD-10-CM | POA: Diagnosis not present

## 2022-06-30 DIAGNOSIS — E1139 Type 2 diabetes mellitus with other diabetic ophthalmic complication: Secondary | ICD-10-CM | POA: Diagnosis not present

## 2022-06-30 DIAGNOSIS — J42 Unspecified chronic bronchitis: Secondary | ICD-10-CM

## 2022-06-30 DIAGNOSIS — F339 Major depressive disorder, recurrent, unspecified: Secondary | ICD-10-CM

## 2022-06-30 DIAGNOSIS — F419 Anxiety disorder, unspecified: Secondary | ICD-10-CM

## 2022-06-30 DIAGNOSIS — E039 Hypothyroidism, unspecified: Secondary | ICD-10-CM | POA: Diagnosis not present

## 2022-06-30 LAB — BAYER DCA HB A1C WAIVED: HB A1C (BAYER DCA - WAIVED): 5.5 % (ref 4.8–5.6)

## 2022-06-30 MED ORDER — OZEMPIC (1 MG/DOSE) 4 MG/3ML ~~LOC~~ SOPN: PEN_INJECTOR | SUBCUTANEOUS | 1 refills | Status: DC

## 2022-06-30 MED ORDER — ONDANSETRON HCL 4 MG PO TABS: 4.0000 mg | ORAL_TABLET | Freq: Three times a day (TID) | ORAL | 0 refills | Status: DC | PRN

## 2022-06-30 MED ORDER — ALBUTEROL SULFATE HFA 108 (90 BASE) MCG/ACT IN AERS: 1.0000 | INHALATION_SPRAY | Freq: Four times a day (QID) | RESPIRATORY_TRACT | 6 refills | Status: DC | PRN

## 2022-06-30 NOTE — Progress Notes (Signed)
Interpreted by me today. NSR at 80. No ST segment changes.

## 2022-06-30 NOTE — Assessment & Plan Note (Signed)
Doing great with A1c of 5.5. Continue current regimen. Call with any concerns.

## 2022-06-30 NOTE — Progress Notes (Signed)
BP 123/66   Pulse (!) 103   Temp 98.4 F (36.9 C)   Ht 5\' 1"  (1.549 m)   Wt 156 lb (70.8 kg)   SpO2 97%   BMI 29.48 kg/m    Subjective:    Patient ID: Sharon Rodriguez, female    DOB: 08-25-1980, 42 y.o.   MRN: [id number]  HPI: Sharon Rodriguez is a 42 y.o. female  Chief Complaint  Patient presents with   Diabetes    New rx for Ozempic, Ondansetron (tablets) not dissolvable   Hypothyroidism   DIABETES Hypoglycemic episodes:no Polydipsia/polyuria: no Visual disturbance: no Chest pain: no Paresthesias: no Glucose Monitoring: yes  Accucheck frequency:  occasionally  Post prandial: 100 Taking Insulin?: no Blood Pressure Monitoring: not checking Retinal Examination: Up to Date Foot Exam: Up to Date Diabetic Education: Completed Pneumovax: Up to Date Influenza: Up to Date Aspirin: no  HYPOTHYROIDISM Thyroid control status: unsure Satisfied with current treatment? no Medication side effects: yes Medication compliance: excellent compliance Recent dose adjustment:no Fatigue: yes Cold intolerance: no Heat intolerance: no Weight gain: no Weight loss: no Constipation: no Diarrhea/loose stools: no Palpitations: yes Lower extremity edema: no Anxiety/depressed mood: yes  PALPITATIONS Duration: 3 weeks Symptom description: fluttering Duration of episode: hours Frequency:  about 3 weeks Activity when event occurred:  when she was sick , then it seemed to get better  Related to exertion: no Dyspnea: no Chest pain: no Syncope: no Anxiety/stress: yes Nausea/vomiting: yes Diaphoresis: no Coronary artery disease: no Congestive heart failure: no Arrhythmia:no Thyroid disease: yes Caffeine intake: 5-6 week Status:  better Treatments attempted:none   Relevant past medical, surgical, family and social history reviewed and updated as indicated. Interim medical history since our last visit reviewed. Allergies and medications reviewed and updated.  Review of  Systems  Constitutional: Negative.   Respiratory: Negative.    Cardiovascular:  Positive for palpitations. Negative for chest pain and leg swelling.  Gastrointestinal: Negative.   Musculoskeletal: Negative.   Neurological: Negative.   Psychiatric/Behavioral: Negative.      Per HPI unless specifically indicated above     Objective:    BP 123/66   Pulse (!) 103   Temp 98.4 F (36.9 C)   Ht 5\' 1"  (1.549 m)   Wt 156 lb (70.8 kg)   SpO2 97%   BMI 29.48 kg/m   Wt Readings from Last 3 Encounters:  06/30/22 156 lb (70.8 kg)  06/27/22 155 lb (70.3 kg)  06/20/22 155 lb (70.3 kg)    Physical Exam Vitals and nursing note reviewed.  Constitutional:      General: She is not in acute distress.    Appearance: Normal appearance. She is normal weight. She is not ill-appearing, toxic-appearing or diaphoretic.  HENT:     Head: Normocephalic and atraumatic.     Right Ear: External ear normal.     Left Ear: External ear normal.     Nose: Nose normal.     Mouth/Throat:     Mouth: Mucous membranes are moist.     Pharynx: Oropharynx is clear.  Eyes:     General: No scleral icterus.       Right eye: No discharge.        Left eye: No discharge.     Extraocular Movements: Extraocular movements intact.     Conjunctiva/sclera: Conjunctivae normal.     Pupils: Pupils are equal, round, and reactive to light.  Cardiovascular:     Rate and Rhythm: Normal rate and regular  rhythm.     Pulses: Normal pulses.     Heart sounds: Normal heart sounds. No murmur heard.    No friction rub. No gallop.  Pulmonary:     Effort: Pulmonary effort is normal. No respiratory distress.     Breath sounds: Normal breath sounds. No stridor. No wheezing, rhonchi or rales.  Chest:     Chest wall: No tenderness.  Musculoskeletal:        General: Normal range of motion.     Cervical back: Normal range of motion and neck supple.  Skin:    General: Skin is warm and dry.     Capillary Refill: Capillary refill takes  less than 2 seconds.     Coloration: Skin is not jaundiced or pale.     Findings: No bruising, erythema, lesion or rash.  Neurological:     General: No focal deficit present.     Mental Status: She is alert and oriented to person, place, and time. Mental status is at baseline.  Psychiatric:        Mood and Affect: Mood normal.        Behavior: Behavior normal.        Thought Content: Thought content normal.        Judgment: Judgment normal.     Results for orders placed or performed in visit on 04/20/22  Respiratory Panel w/ SARS-CoV2  Result Value Ref Range   Adenovirus CANCELED    Coronavirus HKU1 CANCELED    Coronavirus NL63 CANCELED    Coronavirus 229E CANCELED    Coronavirus OC43 CANCELED    SARS-CoV-2 CANCELED    Human Metapneumovirus CANCELED    Human Rhinovirus/Enterovirus CANCELED    Influenza A CANCELED    Influenza A/H1 CANCELED    Influenza A/H1-2009 CANCELED    Influenza A/H3 CANCELED    Influenza B CANCELED    Parainfluenza 1 CANCELED    Parainfluenza 2 CANCELED    Parainfluenza 3 CANCELED    Parainfluenza 4 CANCELED    Respiratory Syncytial Virus CANCELED    Bordetella parapertussis CANCELED    Bordetella pertussis CANCELED    Chlamydophila pneumoniae CANCELED    Mycoplasma pneumoniae CANCELED       Assessment & Plan:   Problem List Items Addressed This Visit       Respiratory   Chronic bronchitis    Under good control on current regimen. Continue current regimen. Continue to monitor. Call with any concerns. Refills given. Labs drawn today.         Endocrine   Hypothyroidism    Rechecking labs today. Await results. Treat as needed.       Relevant Orders   CBC with Differential/Platelet   Comprehensive metabolic panel   TSH   Diabetes mellitus - Primary    Doing great with A1c of 5.5. Continue current regimen. Call with any concerns.       Relevant Medications   Semaglutide, 1 MG/DOSE, (OZEMPIC, 1 MG/DOSE,) 4 MG/3ML SOPN   Other  Relevant Orders   Bayer DCA Hb A1c Waived   CBC with Differential/Platelet   Comprehensive metabolic panel   Lipid Panel w/o Chol/HDL Ratio     Musculoskeletal and Integument   Rheumatoid arthritis    Continue to follow with rheumatology. Call with any concerns.       Relevant Medications   ibuprofen (ADVIL) 800 MG tablet     Other   Anxiety    Continue to follow with psychiatry. Call with any concerns. Continue  to monitor.       Depression    Continue to follow with psychiatry. Call with any concerns. Continue to monitor.       Relevant Orders   CBC with Differential/Platelet   Comprehensive metabolic panel   ADD (attention deficit disorder)    Continue to follow with psychiatry. Call with any concerns. Continue to monitor.       Vitamin D deficiency    Rechecking labs today. Await results. Treat as needed.       Relevant Orders   CBC with Differential/Platelet   Comprehensive metabolic panel   VITAMIN D 25 Hydroxy (Vit-D Deficiency, Fractures)   Other Visit Diagnoses     Palpitations       EKG normal. WIll check labs and get set up with zio. Await results. Treat as needed.   Relevant Orders   EKG 12-Lead (Completed)   LONG TERM MONITOR (3-14 DAYS)        Follow up plan: Return in about 6 weeks (around 08/11/2022) for follow up zio.

## 2022-06-30 NOTE — Assessment & Plan Note (Signed)
Under good control on current regimen. Continue current regimen. Continue to monitor. Call with any concerns. Refills given. Labs drawn today.   

## 2022-06-30 NOTE — Assessment & Plan Note (Signed)
Continue to follow with rheumatology. Call with any concerns.  

## 2022-06-30 NOTE — Assessment & Plan Note (Signed)
Continue to follow with psychiatry. Call with any concerns. Continue to monitor.  

## 2022-06-30 NOTE — Assessment & Plan Note (Signed)
Rechecking labs today. Await results. Treat as needed.  °

## 2022-07-01 LAB — COMPREHENSIVE METABOLIC PANEL
ALT: 12 IU/L (ref 0–32)
AST: 26 IU/L (ref 0–40)
Albumin/Globulin Ratio: 1.5 (ref 1.2–2.2)
Albumin: 4.2 g/dL (ref 3.9–4.9)
Alkaline Phosphatase: 69 IU/L (ref 44–121)
BUN/Creatinine Ratio: 15 (ref 9–23)
BUN: 11 mg/dL (ref 6–24)
Bilirubin Total: 0.4 mg/dL (ref 0.0–1.2)
CO2: 21 mmol/L (ref 20–29)
Calcium: 9.1 mg/dL (ref 8.7–10.2)
Chloride: 101 mmol/L (ref 96–106)
Creatinine, Ser: 0.74 mg/dL (ref 0.57–1.00)
Globulin, Total: 2.8 g/dL (ref 1.5–4.5)
Glucose: 113 mg/dL — ABNORMAL HIGH (ref 70–99)
Potassium: 4.3 mmol/L (ref 3.5–5.2)
Sodium: 136 mmol/L (ref 134–144)
Total Protein: 7 g/dL (ref 6.0–8.5)
eGFR: 103 mL/min/{1.73_m2} (ref >59)

## 2022-07-01 LAB — LIPID PANEL W/O CHOL/HDL RATIO
Cholesterol, Total: 172 mg/dL (ref 100–199)
HDL: 44 mg/dL (ref >39)
LDL Chol Calc (NIH): 107 mg/dL — ABNORMAL HIGH (ref 0–99)
Triglycerides: 115 mg/dL (ref 0–149)
VLDL Cholesterol Cal: 21 mg/dL (ref 5–40)

## 2022-07-01 LAB — CBC WITH DIFFERENTIAL/PLATELET
Basophils Absolute: 0 10*3/uL (ref 0.0–0.2)
Basos: 1 %
EOS (ABSOLUTE): 0.1 10*3/uL (ref 0.0–0.4)
Eos: 1 %
Hematocrit: 41.2 % (ref 34.0–46.6)
Hemoglobin: 13.7 g/dL (ref 11.1–15.9)
Immature Grans (Abs): 0 10*3/uL (ref 0.0–0.1)
Immature Granulocytes: 0 %
Lymphocytes Absolute: 2.7 10*3/uL (ref 0.7–3.1)
Lymphs: 33 %
MCH: 28.7 pg (ref 26.6–33.0)
MCHC: 33.3 g/dL (ref 31.5–35.7)
MCV: 86 fL (ref 79–97)
Monocytes Absolute: 0.4 10*3/uL (ref 0.1–0.9)
Monocytes: 5 %
Neutrophils Absolute: 4.9 10*3/uL (ref 1.4–7.0)
Neutrophils: 60 %
Platelets: 246 10*3/uL (ref 150–450)
RBC: 4.78 x10E6/uL (ref 3.77–5.28)
RDW: 13.1 % (ref 11.7–15.4)
WBC: 8.2 10*3/uL (ref 3.4–10.8)

## 2022-07-01 LAB — TOXASSURE SELECT 13 (MW), URINE

## 2022-07-01 LAB — TSH: TSH: 3.02 u[IU]/mL (ref 0.450–4.500)

## 2022-07-01 LAB — VITAMIN D 25 HYDROXY (VIT D DEFICIENCY, FRACTURES): Vit D, 25-Hydroxy: 19.8 ng/mL — ABNORMAL LOW (ref 30.0–100.0)

## 2022-07-02 ENCOUNTER — Encounter: Payer: Self-pay | Admitting: Family Medicine

## 2022-07-03 ENCOUNTER — Other Ambulatory Visit: Payer: Self-pay | Admitting: Family Medicine

## 2022-07-03 MED ORDER — THYROID 15 MG PO TABS: 45.0000 mg | ORAL_TABLET | Freq: Every day | ORAL | 3 refills | Status: DC

## 2022-07-03 MED ORDER — VITAMIN D (ERGOCALCIFEROL) 1.25 MG (50000 UNIT) PO CAPS: 50000.0000 [IU] | ORAL_CAPSULE | ORAL | 1 refills | Status: AC

## 2022-07-06 ENCOUNTER — Telehealth: Payer: Self-pay

## 2022-07-06 NOTE — Telephone Encounter (Signed)
Please let her know that her insurance will not cover her armour thyroid. She can pay out of pocket or I can change her to synthroid. Her choice.

## 2022-07-06 NOTE — Telephone Encounter (Signed)
Patient was notified via MyChart of Dr Henriette Combs recommendations. Advised patient to reach out to our office if she has any questions.

## 2022-07-06 NOTE — Telephone Encounter (Signed)
Prior Authorization was initiated via CoverMyMeds for Armour Thyroid 15 MG. Patient was denied via insurance.  KEY: [id number]

## 2022-07-08 DIAGNOSIS — R002 Palpitations: Secondary | ICD-10-CM

## 2022-07-14 ENCOUNTER — Ambulatory Visit (INDEPENDENT_AMBULATORY_CARE_PROVIDER_SITE_OTHER): Payer: 59 | Admitting: Family Medicine

## 2022-07-14 ENCOUNTER — Encounter: Payer: Self-pay | Admitting: Family Medicine

## 2022-07-14 VITALS — BP 94/65 | HR 81 | Temp 98.8°F | Wt 158.0 lb

## 2022-07-14 DIAGNOSIS — M9905 Segmental and somatic dysfunction of pelvic region: Secondary | ICD-10-CM

## 2022-07-14 DIAGNOSIS — G8929 Other chronic pain: Secondary | ICD-10-CM

## 2022-07-14 DIAGNOSIS — M9909 Segmental and somatic dysfunction of abdomen and other regions: Secondary | ICD-10-CM | POA: Diagnosis not present

## 2022-07-14 DIAGNOSIS — M9902 Segmental and somatic dysfunction of thoracic region: Secondary | ICD-10-CM

## 2022-07-14 DIAGNOSIS — M9908 Segmental and somatic dysfunction of rib cage: Secondary | ICD-10-CM | POA: Diagnosis not present

## 2022-07-14 DIAGNOSIS — M545 Low back pain, unspecified: Secondary | ICD-10-CM | POA: Diagnosis not present

## 2022-07-14 DIAGNOSIS — M9904 Segmental and somatic dysfunction of sacral region: Secondary | ICD-10-CM | POA: Diagnosis not present

## 2022-07-14 DIAGNOSIS — M9903 Segmental and somatic dysfunction of lumbar region: Secondary | ICD-10-CM

## 2022-07-14 NOTE — Assessment & Plan Note (Signed)
Myofascial in nature. She does have somatic dysfunction that is contributing to her symptoms. Treated today with good results as below. Call with any concerns. Continue to monitor.

## 2022-07-14 NOTE — Progress Notes (Signed)
BP 94/65   Pulse 81   Temp 98.8 F (37.1 C)   Wt 158 lb (71.7 kg)   SpO2 97%   BMI 29.85 kg/m    Subjective:    Patient ID: Sharon Rodriguez, female    DOB: 04/22/1980, 42 y.o.   MRN: [id number]  HPI: Sharon Rodriguez is a 42 y.o. female  Chief Complaint  Patient presents with   Back Pain   Sharon Rodriguez presents today for evaluation and possible treatment with OMT for back pain. She notes that it has been a while since she was seen and that she has generally done pretty well. She notes that she has been having pain in her R lower back pain for a couple of weeks. No radiation. Activity makes it worse rest makes it better. She has otherwise been feeling well. No other concerns or complaints at this time.   Relevant past medical, surgical, family and social history reviewed and updated as indicated. Interim medical history since our last visit reviewed. Allergies and medications reviewed and updated.  Review of Systems  Constitutional: Negative.   Respiratory: Negative.    Cardiovascular: Negative.   Gastrointestinal: Negative.   Musculoskeletal:  Positive for arthralgias, back pain and myalgias. Negative for gait problem, joint swelling, neck pain and neck stiffness.  Skin: Negative.   Neurological: Negative.   Psychiatric/Behavioral: Negative.      Per HPI unless specifically indicated above     Objective:    BP 94/65   Pulse 81   Temp 98.8 F (37.1 C)   Wt 158 lb (71.7 kg)   SpO2 97%   BMI 29.85 kg/m   Wt Readings from Last 3 Encounters:  07/14/22 158 lb (71.7 kg)  06/30/22 156 lb (70.8 kg)  06/27/22 155 lb (70.3 kg)    Physical Exam Vitals and nursing note reviewed.  Constitutional:      General: She is not in acute distress.    Appearance: Normal appearance. She is not ill-appearing.  HENT:     Head: Normocephalic and atraumatic.     Right Ear: External ear normal.     Left Ear: External ear normal.     Nose: Nose normal.     Mouth/Throat:     Mouth:  Mucous membranes are moist.     Pharynx: Oropharynx is clear.  Eyes:     Extraocular Movements: Extraocular movements intact.     Conjunctiva/sclera: Conjunctivae normal.     Pupils: Pupils are equal, round, and reactive to light.  Neck:     Vascular: No carotid bruit.  Cardiovascular:     Rate and Rhythm: Normal rate.     Pulses: Normal pulses.  Pulmonary:     Effort: Pulmonary effort is normal. No respiratory distress.  Abdominal:     General: Abdomen is flat. There is no distension.     Palpations: Abdomen is soft. There is no mass.     Tenderness: There is no abdominal tenderness. There is no right CVA tenderness, left CVA tenderness, guarding or rebound.     Hernia: No hernia is present.  Musculoskeletal:     Cervical back: No muscular tenderness.  Lymphadenopathy:     Cervical: No cervical adenopathy.  Skin:    General: Skin is warm and dry.     Capillary Refill: Capillary refill takes less than 2 seconds.     Coloration: Skin is not jaundiced or pale.     Findings: No bruising, erythema, lesion or rash.  Neurological:  General: No focal deficit present.     Mental Status: She is alert. Mental status is at baseline.  Psychiatric:        Mood and Affect: Mood normal.        Behavior: Behavior normal.        Thought Content: Thought content normal.        Judgment: Judgment normal.   Musculoskeletal:  Exam found Decreased ROM, Tissue texture changes, Tenderness to palpation, and Asymmetry of patient's  thorax, ribs, lumbar, pelvis, sacrum, and abdomen Osteopathic Structural Exam:   Thorax: T4-5SLRR  Ribs: Ribs 3-6 locked up on the L  Lumbar: QL and psoas hypertonic on the R  Pelvis: Posterior R innominate  Sacrum: R on R torsion  Abdomen: diaphragm hypertonic bilaterally L>R   Results for orders placed or performed in visit on 06/30/22  Bayer DCA Hb A1c Waived  Result Value Ref Range   HB A1C (BAYER DCA - WAIVED) 5.5 4.8 - 5.6 %  CBC with Differential/Platelet   Result Value Ref Range   WBC 8.2 3.4 - 10.8 x10E3/uL   RBC 4.78 3.77 - 5.28 x10E6/uL   Hemoglobin 13.7 11.1 - 15.9 g/dL   Hematocrit 16.1 09.6 - 46.6 %   MCV 86 79 - 97 fL   MCH 28.7 26.6 - 33.0 pg   MCHC 33.3 31.5 - 35.7 g/dL   RDW 04.5 40.9 - 81.1 %   Platelets 246 150 - 450 x10E3/uL   Neutrophils 60 Not Estab. %   Lymphs 33 Not Estab. %   Monocytes 5 Not Estab. %   Eos 1 Not Estab. %   Basos 1 Not Estab. %   Neutrophils Absolute 4.9 1.4 - 7.0 x10E3/uL   Lymphocytes Absolute 2.7 0.7 - 3.1 x10E3/uL   Monocytes Absolute 0.4 0.1 - 0.9 x10E3/uL   EOS (ABSOLUTE) 0.1 0.0 - 0.4 x10E3/uL   Basophils Absolute 0.0 0.0 - 0.2 x10E3/uL   Immature Granulocytes 0 Not Estab. %   Immature Grans (Abs) 0.0 0.0 - 0.1 x10E3/uL  Comprehensive metabolic panel  Result Value Ref Range   Glucose 113 (H) 70 - 99 mg/dL   BUN 11 6 - 24 mg/dL   Creatinine, Ser 9.14 0.57 - 1.00 mg/dL   eGFR 782 >95 AO/ZHY/8.65   BUN/Creatinine Ratio 15 9 - 23   Sodium 136 134 - 144 mmol/L   Potassium 4.3 3.5 - 5.2 mmol/L   Chloride 101 96 - 106 mmol/L   CO2 21 20 - 29 mmol/L   Calcium 9.1 8.7 - 10.2 mg/dL   Total Protein 7.0 6.0 - 8.5 g/dL   Albumin 4.2 3.9 - 4.9 g/dL   Globulin, Total 2.8 1.5 - 4.5 g/dL   Albumin/Globulin Ratio 1.5 1.2 - 2.2   Bilirubin Total 0.4 0.0 - 1.2 mg/dL   Alkaline Phosphatase 69 44 - 121 IU/L   AST 26 0 - 40 IU/L   ALT 12 0 - 32 IU/L  Lipid Panel w/o Chol/HDL Ratio  Result Value Ref Range   Cholesterol, Total 172 100 - 199 mg/dL   Triglycerides 784 0 - 149 mg/dL   HDL 44 >69 mg/dL   VLDL Cholesterol Cal 21 5 - 40 mg/dL   LDL Chol Calc (NIH) 629 (H) 0 - 99 mg/dL  TSH  Result Value Ref Range   TSH 3.020 0.450 - 4.500 uIU/mL  VITAMIN D 25 Hydroxy (Vit-D Deficiency, Fractures)  Result Value Ref Range   Vit D, 25-Hydroxy 19.8 (L) 30.0 - 100.0  ng/mL      Assessment & Plan:   Problem List Items Addressed This Visit       Other   Chronic bilateral low back pain without sciatica  - Primary (Chronic)    Myofascial in nature. She does have somatic dysfunction that is contributing to her symptoms. Treated today with good results as below. Call with any concerns. Continue to monitor.       Other Visit Diagnoses     Somatic dysfunction of lumbar region       Thoracic segment dysfunction       Segmental dysfunction of rib cage       Segmental dysfunction of abdomen       Somatic dysfunction of sacral region       Somatic dysfunction of pelvis region            Follow up plan: Return if symptoms worsen or fail to improve.

## 2022-07-20 ENCOUNTER — Other Ambulatory Visit: Payer: Self-pay | Admitting: Family Medicine

## 2022-07-20 ENCOUNTER — Encounter: Payer: Self-pay | Admitting: Family Medicine

## 2022-07-20 NOTE — Telephone Encounter (Incomplete)
Requested medication (s) are due for refill today: routing for review  Requested medication (s) are on the active medication list: yes  Last refill:  07/03/22  Future visit scheduled: yes  Notes to clinic:  Unable to refill per protocol, Rx request for thyroid needs PA, can't delegate Vitamin D      Requested Prescriptions  Pending Prescriptions Disp Refills   thyroid (ARMOUR THYROID) 15 MG tablet 270 tablet 3    Sig: Take 3 tablets (45 mg total) by mouth daily.     Endocrinology:  Hypothyroid Agents Passed - 07/20/2022 11:50 AM      Passed - TSH in normal range and within 360 days    TSH  Date Value Ref Range Status  06/30/2022 3.020 0.450 - 4.500 uIU/mL Final         Passed - Valid encounter within last 12 months    Recent Outpatient Visits           6 days ago Chronic bilateral low back pain without sciatica   [place] [facility] [place], Megan P, DO   2 weeks ago Type 2 diabetes mellitus with other ophthalmic complication, without long-term current use of insulin (HCC)   [place] [facility] [place], Megan P, DO   3 months ago Flu-like symptoms   [place] [facility] [place], Megan P, DO   5 months ago URI with cough and congestion   [place] [facility] Mec[place], Oswaldo Conroy, PA-C   7 months ago Routine general medical examination at a health care facility   [facility] [place], [place] P, DO       Future Appointments             In 1 week Laural Benes, Oralia Rud, DO [place] [facility]             Vitamin D, Ergocalciferol, (DRISDOL) 1.25 MG (50000 UNIT) CAPS capsule 12 capsule 1    Sig: Take 1 capsule (50,000 Units total) by mouth every 7 (seven) days.     Endocrinology:  Vitamins - Vitamin D Supplementation 2 Failed - 07/20/2022 11:50 AM      Failed - Manual Review: Route requests for 50,000 IU strength to the provider      Failed - Vitamin D in normal range  and within 360 days    Vit D, 25-Hydroxy  Date Value Ref Range Status  06/30/2022 19.8 (L) 30.0 - 100.0 ng/mL Final    Comment:    Vitamin D deficiency has been defined by the Institute of Medicine and an Endocrine Society practice guideline as a level of serum 25-OH vitamin D less than 20 ng/mL (1,2). The Endocrine Society went on to further define vitamin D insufficiency as a level between 21 and 29 ng/mL (2). 1. IOM (Institute of Medicine). 2010. Dietary reference    intakes for calcium and D. [place]: The    [employer]. 2. Holick MF, Binkley Hollister, Bischoff-Ferrari HA, et al.    Evaluation, treatment, and prevention of vitamin D    deficiency: an Endocrine Society clinical practice    guideline. JCEM. 2011 Jul; 96(7):1911-30.          Passed - Ca in normal range and within 360 days    Calcium  Date Value Ref Range Status  06/30/2022 9.1 8.7 - 10.2 mg/dL Final   Calcium, Total  Date Value Ref Range Status  03/20/2013 8.9 8.5 - 10.1  mg/dL Final         Passed - Valid encounter within last 12 months    Recent Outpatient Visits           6 days ago Chronic bilateral low back pain without sciatica   [place] [facility] [place], [place] P, DO   2 weeks ago Type 2 diabetes mellitus with other ophthalmic complication, without long-term current use of insulin (HCC)   [place] [facility] [place], Megan P, DO   3 months ago Flu-like symptoms   [place] [facility] [place], Megan P, DO   5 months ago URI with cough and congestion   [place] [facility] Mecum, Oswaldo Conroy, PA-C   7 months ago Routine general medical examination at a health care facility   [facility] Dorcas Carrow, DO       Future Appointments             In 1 week Dorcas Carrow, DO [place] [facility], [facility]

## 2022-07-20 NOTE — Telephone Encounter (Signed)
Requested medication (s) are due for refill today: yes  Requested medication (s) are on the active medication list: yes  Last refill:  07/03/22  Future visit scheduled: yes  Notes to clinic:  Unable to refill per protocol, PA needed for Thyroid. Can not delegate Vitamin D.      Requested Prescriptions  Pending Prescriptions Disp Refills   thyroid (ARMOUR THYROID) 15 MG tablet 270 tablet 3    Sig: Take 3 tablets (45 mg total) by mouth daily.     Endocrinology:  Hypothyroid Agents Passed - 07/20/2022 11:50 AM      Passed - TSH in normal range and within 360 days    TSH  Date Value Ref Range Status  06/30/2022 3.020 0.450 - 4.500 uIU/mL Final         Passed - Valid encounter within last 12 months    Recent Outpatient Visits           6 days ago Chronic bilateral low back pain without sciatica   [place] [facility] [place], Megan P, DO   2 weeks ago Type 2 diabetes mellitus with other ophthalmic complication, without long-term current use of insulin (HCC)   [place] [facility] [place], Megan P, DO   3 months ago Flu-like symptoms   [place] [facility] [place], Megan P, DO   5 months ago URI with cough and congestion   [place] [facility] Mecum, Oswaldo Conroy, PA-C   7 months ago Routine general medical examination at a health care facility   [facility] [place], [place] P, DO       Future Appointments             In 1 week Laural Benes, Oralia Rud, DO [place] [facility]             Vitamin D, Ergocalciferol, (DRISDOL) 1.25 MG (50000 UNIT) CAPS capsule 12 capsule 1    Sig: Take 1 capsule (50,000 Units total) by mouth every 7 (seven) days.     Endocrinology:  Vitamins - Vitamin D Supplementation 2 Failed - 07/20/2022 11:50 AM      Failed - Manual Review: Route requests for 50,000 IU strength to the provider      Failed - Vitamin D in normal range and within 360 days     Vit D, 25-Hydroxy  Date Value Ref Range Status  06/30/2022 19.8 (L) 30.0 - 100.0 ng/mL Final    Comment:    Vitamin D deficiency has been defined by the Institute of Medicine and an Endocrine Society practice guideline as a level of serum 25-OH vitamin D less than 20 ng/mL (1,2). The Endocrine Society went on to further define vitamin D insufficiency as a level between 21 and 29 ng/mL (2). 1. IOM (Institute of Medicine). 2010. Dietary reference    intakes for calcium and D. [place]: The    [employer]. 2. Holick MF, Binkley Haynes, Bischoff-Ferrari HA, et al.    Evaluation, treatment, and prevention of vitamin D    deficiency: an Endocrine Society clinical practice    guideline. JCEM. 2011 Jul; 96(7):1911-30.          Passed - Ca in normal range and within 360 days    Calcium  Date Value Ref Range Status  06/30/2022 9.1 8.7 - 10.2 mg/dL Final   Calcium, Total  Date Value Ref Range Status  03/20/2013 8.9 8.5 - 10.1 mg/dL Final  Passed - Valid encounter within last 12 months    Recent Outpatient Visits           6 days ago Chronic bilateral low back pain without sciatica   [place] [facility] [place], [place] P, DO   2 weeks ago Type 2 diabetes mellitus with other ophthalmic complication, without long-term current use of insulin (HCC)   [place] [facility] [place], Megan P, DO   3 months ago Flu-like symptoms   [place] [facility] [place], Megan P, DO   5 months ago URI with cough and congestion   [place] [facility] Mecum, Oswaldo Conroy, PA-C   7 months ago Routine general medical examination at a health care facility   [facility] Dorcas Carrow, DO       Future Appointments             In 1 week Dorcas Carrow, DO [place] [facility], [facility]

## 2022-07-20 NOTE — Telephone Encounter (Signed)
Please let her know. I can send in the levothyroxine or she can pay for the armour thyroid out of pocket.

## 2022-07-20 NOTE — Telephone Encounter (Signed)
Medication Refill - Medication: Vitamin D, Ergocalciferol, (DRISDOL) 1.25 MG   thyroid (ARMOUR THYROID) 15 MG tablet (prior authorization needed)   Has the patient contacted their pharmacy? Yes.   Mandy at the Pharmacy is calling stating they are needing a Prior Authorization to refill the medication.    Preferred Pharmacy (with phone number or street name):[facility] - [place], [place] - [address] Phone: [phone]  Fax: [phone]     Has the patient been seen for an appointment in the last year OR does the patient have an upcoming appointment? Yes.    Agent: Please be advised that RX refills may take up to 3 business days. We ask that you follow-up with your pharmacy.

## 2022-07-21 ENCOUNTER — Other Ambulatory Visit: Payer: Self-pay | Admitting: Family Medicine

## 2022-07-21 DIAGNOSIS — E039 Hypothyroidism, unspecified: Secondary | ICD-10-CM

## 2022-07-21 MED ORDER — LEVOTHYROXINE SODIUM 88 MCG PO TABS: 88.0000 ug | ORAL_TABLET | Freq: Every day | ORAL | 1 refills | Status: DC

## 2022-07-25 DIAGNOSIS — R002 Palpitations: Secondary | ICD-10-CM | POA: Diagnosis not present

## 2022-08-01 ENCOUNTER — Encounter: Payer: Self-pay | Admitting: Family Medicine

## 2022-08-01 ENCOUNTER — Ambulatory Visit (INDEPENDENT_AMBULATORY_CARE_PROVIDER_SITE_OTHER): Payer: 59 | Admitting: Family Medicine

## 2022-08-01 VITALS — Temp 99.1°F | Ht 61.0 in | Wt 157.4 lb

## 2022-08-01 DIAGNOSIS — R002 Palpitations: Secondary | ICD-10-CM | POA: Diagnosis not present

## 2022-08-01 DIAGNOSIS — J029 Acute pharyngitis, unspecified: Secondary | ICD-10-CM

## 2022-08-01 DIAGNOSIS — E039 Hypothyroidism, unspecified: Secondary | ICD-10-CM | POA: Diagnosis not present

## 2022-08-01 MED ORDER — THYROID 65 MG PO TABS: 65.0000 mg | ORAL_TABLET | Freq: Every day | ORAL | 2 refills | Status: DC

## 2022-08-01 NOTE — Assessment & Plan Note (Signed)
Stopped all her thyroid medicine for 2 weeks. Justifiably feeling terrible. Doesn't want to take levothyroxine. Will restart armour thyroid and recheck 6 weeks. Await results.

## 2022-08-01 NOTE — Progress Notes (Signed)
Temp 99.1 F (37.3 C) (Oral)   Ht 5\' 1"  (1.549 m)   Wt 157 lb 6.4 oz (71.4 kg)   SpO2 98%   BMI 29.74 kg/m    Subjective:    Patient ID: Sharon Rodriguez, female    DOB: 17-Dec-1980, 42 y.o.   MRN: [id number]  HPI: Sharon Rodriguez is a 42 y.o. female  Chief Complaint  Patient presents with   Palpitations   UPPER RESPIRATORY TRACT INFECTION Duration: 2 days Worst symptom: sore throat and sneezing Fever: no Cough: yes Shortness of breath: no Wheezing: no Chest pain: no Chest tightness: no Chest congestion: no Nasal congestion: yes Runny nose: yes Post nasal drip: yes Sneezing: yes Sore throat: yes Swollen glands: no Sinus pressure: yes Headache: no Face pain: no Toothache: yes Ear pain: no  Ear pressure: no  Eyes red/itching:no Eye drainage/crusting: yes  Vomiting: no Rash: no Fatigue: yes Sick contacts: yes Strep contacts: yes  Context: better Recurrent sinusitis: no Relief with OTC cold/cough medications: no  Treatments attempted: none   HYPOTHYROIDISM Thyroid control status:uncontrolled Satisfied with current treatment? no Medication side effects: no Medication compliance: poor compliance Recent dose adjustment:yes Fatigue: yes Cold intolerance: no Heat intolerance: no Weight gain: no Weight loss: no Constipation: yes Diarrhea/loose stools: yes Palpitations: yes Lower extremity edema: no Anxiety/depressed mood: no   Relevant past medical, surgical, family and social history reviewed and updated as indicated. Interim medical history since our last visit reviewed. Allergies and medications reviewed and updated.  Review of Systems  Constitutional:  Positive for fatigue. Negative for activity change, appetite change, chills, diaphoresis, fever and unexpected weight change.  Respiratory: Negative.    Cardiovascular: Negative.   Gastrointestinal: Negative.   Musculoskeletal: Negative.   Neurological: Negative.   Psychiatric/Behavioral:  Negative.      Per HPI unless specifically indicated above     Objective:    Temp 99.1 F (37.3 C) (Oral)   Ht 5\' 1"  (1.549 m)   Wt 157 lb 6.4 oz (71.4 kg)   SpO2 98%   BMI 29.74 kg/m   Wt Readings from Last 3 Encounters:  08/01/22 157 lb 6.4 oz (71.4 kg)  07/14/22 158 lb (71.7 kg)  06/30/22 156 lb (70.8 kg)    Physical Exam Vitals and nursing note reviewed.  Constitutional:      General: She is not in acute distress.    Appearance: Normal appearance. She is not ill-appearing, toxic-appearing or diaphoretic.  HENT:     Head: Normocephalic and atraumatic.     Right Ear: External ear normal.     Left Ear: External ear normal.     Nose: Nose normal.     Mouth/Throat:     Mouth: Mucous membranes are moist.     Pharynx: Oropharynx is clear.  Eyes:     General: No scleral icterus.       Right eye: No discharge.        Left eye: No discharge.     Extraocular Movements: Extraocular movements intact.     Conjunctiva/sclera: Conjunctivae normal.     Pupils: Pupils are equal, round, and reactive to light.  Cardiovascular:     Rate and Rhythm: Normal rate and regular rhythm.     Pulses: Normal pulses.     Heart sounds: Normal heart sounds. No murmur heard.    No friction rub. No gallop.  Pulmonary:     Effort: Pulmonary effort is normal. No respiratory distress.     Breath sounds:  Normal breath sounds. No stridor. No wheezing, rhonchi or rales.  Chest:     Chest wall: No tenderness.  Musculoskeletal:        General: Normal range of motion.     Cervical back: Normal range of motion and neck supple.  Skin:    General: Skin is warm and dry.     Capillary Refill: Capillary refill takes less than 2 seconds.     Coloration: Skin is not jaundiced or pale.     Findings: No bruising, erythema, lesion or rash.  Neurological:     General: No focal deficit present.     Mental Status: She is alert and oriented to person, place, and time. Mental status is at baseline.  Psychiatric:         Mood and Affect: Mood normal.        Behavior: Behavior normal.        Thought Content: Thought content normal.        Judgment: Judgment normal.     Results for orders placed or performed in visit on 06/30/22  Bayer DCA Hb A1c Waived  Result Value Ref Range   HB A1C (BAYER DCA - WAIVED) 5.5 4.8 - 5.6 %  CBC with Differential/Platelet  Result Value Ref Range   WBC 8.2 3.4 - 10.8 x10E3/uL   RBC 4.78 3.77 - 5.28 x10E6/uL   Hemoglobin 13.7 11.1 - 15.9 g/dL   Hematocrit 16.1 09.6 - 46.6 %   MCV 86 79 - 97 fL   MCH 28.7 26.6 - 33.0 pg   MCHC 33.3 31.5 - 35.7 g/dL   RDW 04.5 40.9 - 81.1 %   Platelets 246 150 - 450 x10E3/uL   Neutrophils 60 Not Estab. %   Lymphs 33 Not Estab. %   Monocytes 5 Not Estab. %   Eos 1 Not Estab. %   Basos 1 Not Estab. %   Neutrophils Absolute 4.9 1.4 - 7.0 x10E3/uL   Lymphocytes Absolute 2.7 0.7 - 3.1 x10E3/uL   Monocytes Absolute 0.4 0.1 - 0.9 x10E3/uL   EOS (ABSOLUTE) 0.1 0.0 - 0.4 x10E3/uL   Basophils Absolute 0.0 0.0 - 0.2 x10E3/uL   Immature Granulocytes 0 Not Estab. %   Immature Grans (Abs) 0.0 0.0 - 0.1 x10E3/uL  Comprehensive metabolic panel  Result Value Ref Range   Glucose 113 (H) 70 - 99 mg/dL   BUN 11 6 - 24 mg/dL   Creatinine, Ser 9.14 0.57 - 1.00 mg/dL   eGFR 782 >95 AO/ZHY/8.65   BUN/Creatinine Ratio 15 9 - 23   Sodium 136 134 - 144 mmol/L   Potassium 4.3 3.5 - 5.2 mmol/L   Chloride 101 96 - 106 mmol/L   CO2 21 20 - 29 mmol/L   Calcium 9.1 8.7 - 10.2 mg/dL   Total Protein 7.0 6.0 - 8.5 g/dL   Albumin 4.2 3.9 - 4.9 g/dL   Globulin, Total 2.8 1.5 - 4.5 g/dL   Albumin/Globulin Ratio 1.5 1.2 - 2.2   Bilirubin Total 0.4 0.0 - 1.2 mg/dL   Alkaline Phosphatase 69 44 - 121 IU/L   AST 26 0 - 40 IU/L   ALT 12 0 - 32 IU/L  Lipid Panel w/o Chol/HDL Ratio  Result Value Ref Range   Cholesterol, Total 172 100 - 199 mg/dL   Triglycerides 784 0 - 149 mg/dL   HDL 44 >69 mg/dL   VLDL Cholesterol Cal 21 5 - 40 mg/dL   LDL Chol Calc  (NIH) 107 (H) 0 -  99 mg/dL  TSH  Result Value Ref Range   TSH 3.020 0.450 - 4.500 uIU/mL  VITAMIN D 25 Hydroxy (Vit-D Deficiency, Fractures)  Result Value Ref Range   Vit D, 25-Hydroxy 19.8 (L) 30.0 - 100.0 ng/mL      Assessment & Plan:   Problem List Items Addressed This Visit       Endocrine   Hypothyroidism    Stopped all her thyroid medicine for 2 weeks. Justifiably feeling terrible. Doesn't want to take levothyroxine. Will restart armour thyroid and recheck 6 weeks. Await results.       Relevant Medications   thyroid (ARMOUR) 65 MG tablet   Other Visit Diagnoses     Sore throat    -  Primary   Strep negative. Symptomatic care. Call with any concerns.   Relevant Orders   Rapid Strep Screen ([facility] ONLY)   Palpitations       Complicated by hypothyroidism and stopping medicine. Zio normal. Will get back on thyroid mediicne and wait to see if we need to start a beta blocker.        Follow up plan: Return in about 6 weeks (around 09/12/2022).

## 2022-08-04 LAB — RAPID STREP SCREEN (MED CTR MEBANE ONLY): Strep Gp A Ag, IA W/Reflex: NEGATIVE

## 2022-08-04 LAB — CULTURE, GROUP A STREP: Strep A Culture: NEGATIVE

## 2022-08-14 ENCOUNTER — Ambulatory Visit (INDEPENDENT_AMBULATORY_CARE_PROVIDER_SITE_OTHER): Payer: 59 | Admitting: Family Medicine

## 2022-08-14 ENCOUNTER — Encounter: Payer: Self-pay | Admitting: Family Medicine

## 2022-08-14 VITALS — BP 96/65 | HR 92 | Ht 61.0 in | Wt 158.2 lb

## 2022-08-14 DIAGNOSIS — M9909 Segmental and somatic dysfunction of abdomen and other regions: Secondary | ICD-10-CM

## 2022-08-14 DIAGNOSIS — M9900 Segmental and somatic dysfunction of head region: Secondary | ICD-10-CM

## 2022-08-14 DIAGNOSIS — M9908 Segmental and somatic dysfunction of rib cage: Secondary | ICD-10-CM | POA: Diagnosis not present

## 2022-08-14 DIAGNOSIS — G8929 Other chronic pain: Secondary | ICD-10-CM | POA: Diagnosis not present

## 2022-08-14 DIAGNOSIS — M9902 Segmental and somatic dysfunction of thoracic region: Secondary | ICD-10-CM

## 2022-08-14 DIAGNOSIS — M9901 Segmental and somatic dysfunction of cervical region: Secondary | ICD-10-CM | POA: Diagnosis not present

## 2022-08-14 DIAGNOSIS — M9904 Segmental and somatic dysfunction of sacral region: Secondary | ICD-10-CM

## 2022-08-14 DIAGNOSIS — M545 Low back pain, unspecified: Secondary | ICD-10-CM | POA: Diagnosis not present

## 2022-08-14 DIAGNOSIS — M9905 Segmental and somatic dysfunction of pelvic region: Secondary | ICD-10-CM

## 2022-08-14 DIAGNOSIS — M9903 Segmental and somatic dysfunction of lumbar region: Secondary | ICD-10-CM | POA: Diagnosis not present

## 2022-08-14 MED ORDER — LEVOTHYROXINE SODIUM 88 MCG PO TABS: 88.0000 ug | ORAL_TABLET | Freq: Every day | ORAL | 1 refills | Status: DC

## 2022-08-14 NOTE — Progress Notes (Signed)
BP 96/65   Pulse 92   Ht 5\' 1"  (1.549 m)   Wt 158 lb 3.2 oz (71.8 kg)   SpO2 98%   BMI 29.89 kg/m    Subjective:    Patient ID: Sharon Rodriguez, female    DOB: 04/25/1980, 42 y.o.   MRN: [id number]  HPI: Sharon Rodriguez is a 42 y.o. female  Chief Complaint  Patient presents with   Back Pain   [name] presents today for evaluation and possible treatment for low back pain with OMT. She notes that she has been having pain in her R side of her back. She felt better after her last appointment, but she is unsure how long it lasted for or when she started feeling worse again. She notes that her pain is aching and sore. Pain does not radiate. She feels better with OMT and stretching and worse with lifting. She is otherwise feeling well with no other concerns or complaints at this time.   Relevant past medical, surgical, family and social history reviewed and updated as indicated. Interim medical history since our last visit reviewed. Allergies and medications reviewed and updated.  Review of Systems  Constitutional: Negative.   Respiratory: Negative.    Cardiovascular: Negative.   Gastrointestinal: Negative.   Musculoskeletal:  Positive for arthralgias, back pain and myalgias. Negative for gait problem, joint swelling, neck pain and neck stiffness.  Skin: Negative.   Neurological: Negative.   Psychiatric/Behavioral: Negative.      Per HPI unless specifically indicated above     Objective:    BP 96/65   Pulse 92   Ht 5\' 1"  (1.549 m)   Wt 158 lb 3.2 oz (71.8 kg)   SpO2 98%   BMI 29.89 kg/m   Wt Readings from Last 3 Encounters:  08/14/22 158 lb 3.2 oz (71.8 kg)  08/01/22 157 lb 6.4 oz (71.4 kg)  07/14/22 158 lb (71.7 kg)    Physical Exam Vitals and nursing note reviewed.  Constitutional:      General: She is not in acute distress.    Appearance: Normal appearance. She is not ill-appearing.  HENT:     Head: Normocephalic and atraumatic.     Right Ear: External ear  normal.     Left Ear: External ear normal.     Nose: Nose normal.     Mouth/Throat:     Mouth: Mucous membranes are moist.     Pharynx: Oropharynx is clear.  Eyes:     Extraocular Movements: Extraocular movements intact.     Conjunctiva/sclera: Conjunctivae normal.     Pupils: Pupils are equal, round, and reactive to light.  Neck:     Vascular: No carotid bruit.  Cardiovascular:     Rate and Rhythm: Normal rate.     Pulses: Normal pulses.  Pulmonary:     Effort: Pulmonary effort is normal. No respiratory distress.  Abdominal:     General: Abdomen is flat. There is no distension.     Palpations: Abdomen is soft. There is no mass.     Tenderness: There is no abdominal tenderness. There is no right CVA tenderness, left CVA tenderness, guarding or rebound.     Hernia: No hernia is present.  Musculoskeletal:     Cervical back: No muscular tenderness.  Lymphadenopathy:     Cervical: No cervical adenopathy.  Skin:    General: Skin is warm and dry.     Capillary Refill: Capillary refill takes less than 2 seconds.  Coloration: Skin is not jaundiced or pale.     Findings: No bruising, erythema, lesion or rash.  Neurological:     General: No focal deficit present.     Mental Status: She is alert. Mental status is at baseline.  Psychiatric:        Mood and Affect: Mood normal.        Behavior: Behavior normal.        Thought Content: Thought content normal.        Judgment: Judgment normal.    Musculoskeletal:  Exam found Decreased ROM, Tissue texture changes, Tenderness to palpation, and Asymmetry of patient's  head, neck, thorax, ribs, lumbar, pelvis, sacrum, and abdomen Osteopathic Structural Exam:   Head: hypertonic suboccipital muscles, OAESSR  Neck:C4ESRR, SCM hypertonic on the R  Thorax: T5ESRR  Ribs: Ribs 4-7 locked up on the L  Lumbar: QL hypertonic on the R, L3-4SRRL  Pelvis: Posterior L innominate  Sacrum: L on L torsion  Abdomen: diaphragm hypertonic on the  R  Results for orders placed or performed in visit on 08/01/22  Rapid Strep Screen ([facility] ONLY)   Specimen: Other   Other  Result Value Ref Range   Strep Gp A Ag, IA W/Reflex Negative Negative  Culture, Group A Strep   Other  Result Value Ref Range   Strep A Culture Negative       Assessment & Plan:   Problem List Items Addressed This Visit       Other   Chronic bilateral low back pain without sciatica - Primary (Chronic)    She does have somatic dysfunction that is contributing to her symptoms. These are likely myofasical in nature. Treated today with good results as below. Call with any concerns. Continue to monitor. Call with any concerns.       Other Visit Diagnoses     Somatic dysfunction of lumbar region       Thoracic segment dysfunction       Segmental dysfunction of rib cage       Segmental dysfunction of abdomen       Somatic dysfunction of sacral region       Somatic dysfunction of pelvis region       Cervical segment dysfunction       Head region somatic dysfunction          After verbal consent was obtained, patient was treated today with osteopathic manipulative medicine to the regions of the head, neck, thorax, ribs, lumbar, pelvis, sacrum, and abdomen using the techniques of cranial, myofascial release, counterstrain, muscle energy, HVLA, and soft tissue. Areas of compensation relating to her primary pain source also treated. Patient tolerated the procedure well with good objective and good subjective improvement in symptoms. She left the room in good condition. She was advised to stay well hydrated and that she may have some soreness following the procedure. If not improving or worsening, she will call and come in. She will return for reevaluation  on a PRN basis.   Follow up plan: Return if symptoms worsen or fail to improve.

## 2022-08-14 NOTE — Assessment & Plan Note (Signed)
She does have somatic dysfunction that is contributing to her symptoms. These are likely myofasical in nature. Treated today with good results as below. Call with any concerns. Continue to monitor. Call with any concerns.

## 2022-08-24 ENCOUNTER — Other Ambulatory Visit: Payer: Self-pay | Admitting: Family Medicine

## 2022-08-25 NOTE — Telephone Encounter (Signed)
No confirm receipt from pharmacy, will resend.  Requested Prescriptions  Pending Prescriptions Disp Refills   SYNTHROID 88 MCG tablet [Pharmacy Med Name: SYNTHROID 88 MCG TAB] 90 tablet 0    Sig: TAKE 1 TABLET BY MOUTH ONCE DAILY ON AN EMPTY STOMACH. WAIT 30 MINUTES BEFORE TAKING OTHER MEDS.     Endocrinology:  Hypothyroid Agents Passed - 08/24/2022  2:12 PM      Passed - TSH in normal range and within 360 days    TSH  Date Value Ref Range Status  06/30/2022 3.020 0.450 - 4.500 uIU/mL Final         Passed - Valid encounter within last 12 months    Recent Outpatient Visits           1 week ago Chronic bilateral low back pain without sciatica   [place] [facility] [place], Megan P, DO   3 weeks ago Sore throat   [place] [facility] [place], Megan P, DO   1 month ago Chronic bilateral low back pain without sciatica   [place] [facility] [place], Megan P, DO   1 month ago Type 2 diabetes mellitus with other ophthalmic complication, without long-term current use of insulin [facility])   [place] [facility] [place], Megan P, DO   4 months ago Flu-like symptoms   [place] [facility] [place], [place], DO

## 2022-09-01 ENCOUNTER — Other Ambulatory Visit: Payer: 59

## 2022-09-11 ENCOUNTER — Ambulatory Visit (INDEPENDENT_AMBULATORY_CARE_PROVIDER_SITE_OTHER): Payer: 59 | Admitting: Family Medicine

## 2022-09-11 ENCOUNTER — Encounter: Payer: Self-pay | Admitting: Family Medicine

## 2022-09-11 ENCOUNTER — Other Ambulatory Visit: Payer: 59

## 2022-09-11 VITALS — BP 95/66 | HR 96 | Temp 98.5°F | Wt 156.6 lb

## 2022-09-11 DIAGNOSIS — M545 Low back pain, unspecified: Secondary | ICD-10-CM | POA: Diagnosis not present

## 2022-09-11 DIAGNOSIS — M9900 Segmental and somatic dysfunction of head region: Secondary | ICD-10-CM

## 2022-09-11 DIAGNOSIS — M9905 Segmental and somatic dysfunction of pelvic region: Secondary | ICD-10-CM

## 2022-09-11 DIAGNOSIS — M9904 Segmental and somatic dysfunction of sacral region: Secondary | ICD-10-CM | POA: Diagnosis not present

## 2022-09-11 DIAGNOSIS — M9903 Segmental and somatic dysfunction of lumbar region: Secondary | ICD-10-CM

## 2022-09-11 DIAGNOSIS — R102 Pelvic and perineal pain: Secondary | ICD-10-CM | POA: Diagnosis not present

## 2022-09-11 DIAGNOSIS — M9908 Segmental and somatic dysfunction of rib cage: Secondary | ICD-10-CM

## 2022-09-11 DIAGNOSIS — M9909 Segmental and somatic dysfunction of abdomen and other regions: Secondary | ICD-10-CM

## 2022-09-11 DIAGNOSIS — M9901 Segmental and somatic dysfunction of cervical region: Secondary | ICD-10-CM

## 2022-09-11 DIAGNOSIS — E039 Hypothyroidism, unspecified: Secondary | ICD-10-CM

## 2022-09-11 DIAGNOSIS — G8929 Other chronic pain: Secondary | ICD-10-CM

## 2022-09-11 DIAGNOSIS — M9902 Segmental and somatic dysfunction of thoracic region: Secondary | ICD-10-CM | POA: Diagnosis not present

## 2022-09-11 NOTE — Assessment & Plan Note (Signed)
In exacerbation. She does have somatic dysfunction that is contributing to her symptoms. She was treated today with good results as below. Will get her into PT- referral generated today. Call with any concerns.  

## 2022-09-11 NOTE — Progress Notes (Signed)
BP 95/66   Pulse 96   Temp 98.5 F (36.9 C) (Oral)   Wt 156 lb 9.6 oz (71 kg)   LMP 09/02/2022   SpO2 99%   BMI 29.59 kg/m    Subjective:    Patient ID: Sharon Rodriguez, female    DOB: November 20, 1980, 42 y.o.   MRN: [id number]  HPI: Sharon Rodriguez is a 42 y.o. female  Chief Complaint  Patient presents with   Back Pain   [name] presents today because she "feels like her pelvis is off." She notes that for the past few days she has been having trouble walking and has been having pain in her hips and her low back. No radiation of her pain. Pain is aching and sore in nature. Better with OMT and physical therapy and stretching and worse with yard work and lifting. She notes that in the past couple of months her hips feel like they are "off" more often. She has been doing her stretching at home, but it's not helping as much. She would like to get back into see PT. No other concerns or complaints at this time.   Relevant past medical, surgical, family and social history reviewed and updated as indicated. Interim medical history since our last visit reviewed. Allergies and medications reviewed and updated.  Review of Systems  Constitutional: Negative.   Respiratory: Negative.    Cardiovascular: Negative.   Gastrointestinal: Negative.   Musculoskeletal:  Positive for arthralgias, back pain, myalgias, neck pain and neck stiffness. Negative for gait problem and joint swelling.  Skin: Negative.   Neurological: Negative.   Psychiatric/Behavioral: Negative.      Per HPI unless specifically indicated above     Objective:    BP 95/66   Pulse 96   Temp 98.5 F (36.9 C) (Oral)   Wt 156 lb 9.6 oz (71 kg)   LMP 09/02/2022   SpO2 99%   BMI 29.59 kg/m   Wt Readings from Last 3 Encounters:  09/11/22 156 lb 9.6 oz (71 kg)  08/14/22 158 lb 3.2 oz (71.8 kg)  08/01/22 157 lb 6.4 oz (71.4 kg)    Physical Exam Vitals and nursing note reviewed.  Constitutional:      General: She is not  in acute distress.    Appearance: Normal appearance. She is not ill-appearing.  HENT:     Head: Normocephalic and atraumatic.     Right Ear: External ear normal.     Left Ear: External ear normal.     Nose: Nose normal.     Mouth/Throat:     Mouth: Mucous membranes are moist.     Pharynx: Oropharynx is clear.  Eyes:     Extraocular Movements: Extraocular movements intact.     Conjunctiva/sclera: Conjunctivae normal.     Pupils: Pupils are equal, round, and reactive to light.  Neck:     Vascular: No carotid bruit.  Cardiovascular:     Rate and Rhythm: Normal rate.     Pulses: Normal pulses.  Pulmonary:     Effort: Pulmonary effort is normal. No respiratory distress.  Abdominal:     General: Abdomen is flat. There is no distension.     Palpations: Abdomen is soft. There is no mass.     Tenderness: There is no abdominal tenderness. There is no right CVA tenderness, left CVA tenderness, guarding or rebound.     Hernia: No hernia is present.  Musculoskeletal:     Cervical back: No muscular tenderness.  Lymphadenopathy:     Cervical: No cervical adenopathy.  Skin:    General: Skin is warm and dry.     Capillary Refill: Capillary refill takes less than 2 seconds.     Coloration: Skin is not jaundiced or pale.     Findings: No bruising, erythema, lesion or rash.  Neurological:     General: No focal deficit present.     Mental Status: She is alert. Mental status is at baseline.  Psychiatric:        Mood and Affect: Mood normal.        Behavior: Behavior normal.        Thought Content: Thought content normal.        Judgment: Judgment normal.   Musculoskeletal:  Exam found Decreased ROM, Tissue texture changes, Tenderness to palpation, and Asymmetry of patient's  head, neck, thorax, ribs, lumbar, pelvis, sacrum, and abdomen Osteopathic Structural Exam:   Head: hypertonic suboccipital muscles    Neck: C4ESRR  Thorax: T3-5SRRL  Ribs: ribs 4-7 locked up on the L, rib 6 locked up  on the R  Lumbar: QL hypertonic on the L, L3-5SLRR  Pelvis: anterior L innominate  Sacrum: L on L torsion  Abdomen: diaphragm hypertonic bilaterlly L>R   Results for orders placed or performed in visit on 08/01/22  Rapid Strep Screen ([facility] ONLY)   Specimen: Other   Other  Result Value Ref Range   Strep Gp A Ag, IA W/Reflex Negative Negative  Culture, Group A Strep   Other  Result Value Ref Range   Strep A Culture Negative       Assessment & Plan:   Problem List Items Addressed This Visit       Other   Chronic bilateral low back pain without sciatica - Primary (Chronic)    In exacerbation. She does have somatic dysfunction that is contributing to her symptoms. She was treated today with good results as below. Will get her into PT- referral generated today. Call with any concerns.       Relevant Orders   Ambulatory referral to Physical Therapy   Pelvic pain in female    In exacerbation. She does have somatic dysfunction that is contributing to her symptoms. She was treated today with good results as below. Will get her into PT- referral generated today. Call with any concerns.       Relevant Orders   Ambulatory referral to Physical Therapy   Other Visit Diagnoses     Somatic dysfunction of lumbar region       Thoracic segment dysfunction       Segmental dysfunction of rib cage       Segmental dysfunction of abdomen       Somatic dysfunction of sacral region       Somatic dysfunction of pelvis region       Cervical segment dysfunction       Head region somatic dysfunction          After verbal consent was obtained, patient was treated today with osteopathic manipulative medicine to the regions of the head, neck, thorax, ribs, lumbar, pelvis, sacrum, and abdomen using the techniques of cranial, myofascial release, counterstrain, muscle energy, HVLA, and soft tissue. Areas of compensation relating to her primary pain source also treated. Patient tolerated the  procedure well with good objective and fair subjective improvement in symptoms. She left the room in good condition. She was advised to stay well hydrated and that she may have  some soreness following the procedure. If not improving or worsening, she will call and come in. She will return for reevaluation  on a PRN basis.   Follow up plan: Return ASAP with me for regular follow up (20 min).

## 2022-09-11 NOTE — Assessment & Plan Note (Signed)
In exacerbation. She does have somatic dysfunction that is contributing to her symptoms. She was treated today with good results as below. Will get her into PT- referral generated today. Call with any concerns.

## 2022-09-12 ENCOUNTER — Ambulatory Visit: Payer: 59 | Admitting: Family Medicine

## 2022-09-12 LAB — TSH: TSH: 0.131 u[IU]/mL — ABNORMAL LOW (ref 0.450–4.500)

## 2022-09-15 ENCOUNTER — Other Ambulatory Visit: Payer: Self-pay | Admitting: Family Medicine

## 2022-09-15 DIAGNOSIS — E039 Hypothyroidism, unspecified: Secondary | ICD-10-CM

## 2022-09-18 ENCOUNTER — Telehealth: Payer: Self-pay

## 2022-09-18 NOTE — Telephone Encounter (Signed)
Called patient to make her aware of recent lab results. Patient says she was informed by Dr Laural Benes that she needed to come back for a follow up in office on July 16 th and patient is scheduled for August 16 th. According to note it says to return ASAP with Dr Laural Benes. Please advise on which follow up date is correct for the patient to come back in for appointment?

## 2022-09-19 NOTE — Telephone Encounter (Signed)
Called patient to discuss the appt needed, patient stated that she  will keep appt that has been scheduled 8/16.

## 2022-09-25 ENCOUNTER — Other Ambulatory Visit: Payer: Self-pay | Admitting: Family Medicine

## 2022-09-26 NOTE — Telephone Encounter (Signed)
Requested medications are due for refill today.  unsure  Requested medications are on the active medications list.  No - dosage is different  Last refill. 06/30/2022   Future visit scheduled.   yes  Notes to clinic.  Please review for refill.    Requested Prescriptions  Pending Prescriptions Disp Refills   OZEMPIC, 1 MG/DOSE, 4 MG/3ML SOPN [Pharmacy Med Name: OZEMPIC (1 MG/DOSE) 4 MG/3ML SUBQ S] 9 mL 1    Sig: INJECT 0.375MLS (0.5MG  TOTAL) SUBCUTANEOUSLY ONCE A WEEK     Endocrinology:  Diabetes - GLP-1 Receptor Agonists - semaglutide Passed - 09/25/2022  3:11 PM      Passed - HBA1C in normal range and within 180 days    Hemoglobin A1C  Date Value Ref Range Status  07/08/2019 7.6  Final   HB A1C (BAYER DCA - WAIVED)  Date Value Ref Range Status  06/30/2022 5.5 4.8 - 5.6 % Final    Comment:             Prediabetes: 5.7 - 6.4          Diabetes: >6.4          Glycemic control for adults with diabetes: <7.0          Passed - Cr in normal range and within 360 days    Creatinine  Date Value Ref Range Status  03/20/2013 0.86 0.60 - 1.30 mg/dL Final   Creatinine, Ser  Date Value Ref Range Status  06/30/2022 0.74 0.57 - 1.00 mg/dL Final         Passed - Valid encounter within last 6 months    Recent Outpatient Visits           2 weeks ago Chronic bilateral low back pain without sciatica   [place] [facility] [place], Megan P, DO   1 month ago Chronic bilateral low back pain without sciatica   [place] [facility] [place], Megan P, DO   1 month ago Sore throat   [place] [facility] [place], [place] P, DO   2 months ago Chronic bilateral low back pain without sciatica   [place] [facility] [place], Megan P, DO   2 months ago Type 2 diabetes mellitus with other ophthalmic complication, without long-term current use of insulin (HCC)   [place] [facility] [place], Oralia Rud, DO       Future  Appointments             In 1 month Johnson, Oralia Rud, DO [place] [facility]

## 2022-09-28 ENCOUNTER — Encounter: Payer: Self-pay | Admitting: Student in an Organized Health Care Education/Training Program

## 2022-09-28 ENCOUNTER — Ambulatory Visit
Payer: 59 | Attending: Student in an Organized Health Care Education/Training Program | Admitting: Student in an Organized Health Care Education/Training Program

## 2022-09-28 VITALS — BP 109/55 | HR 93 | Temp 97.9°F | Resp 16 | Ht 61.0 in | Wt 156.0 lb

## 2022-09-28 DIAGNOSIS — R5382 Chronic fatigue, unspecified: Secondary | ICD-10-CM | POA: Diagnosis not present

## 2022-09-28 DIAGNOSIS — M47816 Spondylosis without myelopathy or radiculopathy, lumbar region: Secondary | ICD-10-CM | POA: Insufficient documentation

## 2022-09-28 DIAGNOSIS — M797 Fibromyalgia: Secondary | ICD-10-CM | POA: Diagnosis not present

## 2022-09-28 DIAGNOSIS — G894 Chronic pain syndrome: Secondary | ICD-10-CM | POA: Diagnosis not present

## 2022-09-28 MED ORDER — OXYCODONE-ACETAMINOPHEN 5-325 MG PO TABS
1.0000 | ORAL_TABLET | Freq: Four times a day (QID) | ORAL | 0 refills | Status: AC | PRN
Start: 2022-10-30 — End: 2022-11-29

## 2022-09-28 MED ORDER — OXYCODONE-ACETAMINOPHEN 5-325 MG PO TABS
1.0000 | ORAL_TABLET | Freq: Four times a day (QID) | ORAL | 0 refills | Status: DC | PRN
Start: 2022-11-29 — End: 2022-12-26

## 2022-09-28 MED ORDER — OXYCODONE-ACETAMINOPHEN 5-325 MG PO TABS
1.0000 | ORAL_TABLET | Freq: Four times a day (QID) | ORAL | 0 refills | Status: AC | PRN
Start: 2022-09-30 — End: 2022-10-30

## 2022-09-28 NOTE — Patient Instructions (Signed)

## 2022-09-28 NOTE — Progress Notes (Signed)
PROVIDER NOTE: Information contained herein reflects review and annotations entered in association with encounter. Interpretation of such information and data should be left to medically-trained personnel. Information provided to patient can be located elsewhere in the medical record under "Patient Instructions". Document created using STT-dictation technology, any transcriptional errors that may result from process are unintentional.    Patient: Sharon Rodriguez  Service Category: E/M  Provider: Edward Jolly, MD  DOB: [birth year]  DOS: 09/28/2022  Specialty: Interventional Pain Management  MRN: [id number]  Setting: Ambulatory outpatient  PCP: Dorcas Carrow, DO  Type: Established Patient    Referring Provider: Dorcas Carrow, DO  Location: Office  Delivery: Face-to-face     HPI  Sharon Rodriguez, a 42 y.o. 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 Ms. Perazzo primary complain today is Back Pain  Last encounter: My last encounter with her was on 06/27/2022  Pertinent problems: Ms. Aure has Hypothyroidism; Rheumatoid arthritis (HCC); Depression; Fibromyalgia; TMJ pain dysfunction syndrome; H/O juvenile rheumatoid arthritis; Spondylosis without myelopathy or radiculopathy, lumbar region; Chronic bilateral low back pain without sciatica; Chronic pain syndrome; and Long term current use of opiate analgesic on their pertinent problem list. Pain Assessment: Severity of Chronic pain is reported as a 8 /10. Location: Back Lower/ . Onset: More than a month ago. Quality: Aching, Burning, Throbbing, Sharp. Timing: Constant. Modifying factor(s): medication, rest, ice, heat, PT. Vitals:  height is 5\' 1"  (1.549 m) and weight is 156 lb (70.8 kg). Her temperature is 97.9 F (36.6 C). Her blood pressure is 109/55 (abnormal) and her pulse is 93. Her respiration is 16 and oxygen saturation is 100%.   Reason for encounter: medication  management.    No change in medical history since last visit.  Patient's pain is at baseline.  Patient continues multimodal pain regimen as prescribed.  States that it provides pain relief and improvement in functional status. Still has not heard from physical therapy even after referral by Dr. Laural Benes.  I will replace referral. We discussed Sprint peripheral nerve stimulation of the lumbar medial branch nerves for chronic axial low back pain related to lumbar facet arthropathy.  I provided her with resources and information regarding this therapy and she will let us know if she is interested. She is having increased pain along her left medial foot.  Likely MSK related.  Recommend application of Voltaren gel.   Pharmacotherapy Assessment  Analgesic: Percocet 5 mg 4 times daily as needed, quantity 120/month; MME equals 30    Monitoring: [place] PMP: PDMP reviewed during this encounter.       Pharmacotherapy: No side-effects or adverse reactions reported. Compliance: No problems identified. Effectiveness: Clinically acceptable.  UDS:  Summary  Date Value Ref Range Status  06/27/2022 Note  Final    Comment:    ==================================================================== [employer] [facility] 13 (MW) ==================================================================== Test                             Result       Flag       Units  Drug Present and Declared for Prescription Verification   Oxycodone                      1308         EXPECTED   ng/mg creat   Oxymorphone  207          EXPECTED   ng/mg creat   Noroxycodone                   1342         EXPECTED   ng/mg creat   Noroxymorphone                 128          EXPECTED   ng/mg creat    Sources of oxycodone are scheduled prescription medications.    Oxymorphone, noroxycodone, and noroxymorphone are expected    metabolites of oxycodone. Oxymorphone is also available as a    scheduled prescription medication.  Drug  Absent but Declared for Prescription Verification   Amphetamine                    Not Detected UNEXPECTED ng/mg creat ==================================================================== Test                      Result    Flag   Units      Ref Range   Creatinine              221              mg/dL      >=16 ==================================================================== Declared Medications:  The flagging and interpretation on this report are based on the  following declared medications.  Unexpected results may arise from  inaccuracies in the declared medications.   **Note: The testing scope of this panel includes these medications:   Amphetamine (Adderall)  Oxycodone (Percocet)   **Note: The testing scope of this panel does not include the  following reported medications:   Acetaminophen (Percocet)  Albuterol (Ventolin HFA)  Epinephrine (EpiPen)  Ondansetron (Zofran)  Semaglutide (Ozempic)  Thyroid: Liothyronine/Levothyroxine ([place]) ==================================================================== For clinical consultation, please call [phone]. ====================================================================       ROS  Constitutional: Denies any fever or chills Gastrointestinal: No reported hemesis, hematochezia, vomiting, or acute GI distress Musculoskeletal:  diffuse MSK pain, low back pain Neurological: No reported episodes of acute onset apraxia, aphasia, dysarthria, agnosia, amnesia, paralysis, loss of coordination, or loss of consciousness  Medication Review  EPINEPHrine, ONE TOUCH ULTRA 2, Pen Needles, Semaglutide (1 MG/DOSE), Vitamin D (Ergocalciferol), albuterol, amphetamine-dextroamphetamine, glucose blood, ibuprofen, levothyroxine, ondansetron, onetouch ultrasoft, and oxyCODONE-acetaminophen  History Review  Allergy: Ms. Coppock is allergic to ibuprofen, metformin and related, and promethazine. Drug: Ms. Puder  reports no history of drug  use. Alcohol:  reports no history of alcohol use. Tobacco:  reports that she has never smoked. She has never used smokeless tobacco. Social: Ms. Sazama  reports that she has never smoked. She has never used smokeless tobacco. She reports that she does not drink alcohol and does not use drugs. Medical:  has a past medical history of ADD (attention deficit disorder), Anxiety, Asthma, Biliary dyskinesia (12/27/2017), Chronic bronchitis (HCC), Chronic fatigue, Chronic pain, Depression, Diabetes mellitus without complication (HCC), Fibromyalgia, GERD (gastroesophageal reflux disease), History of stomach ulcers (2019), Hypothyroidism (01/27/2015), Learning disability, Osteoarthritis, Rheumatoid arthritis (HCC), Thyroid disease, and TMJ (dislocation of temporomandibular joint). Surgical: Ms. Crosley  has a past surgical history that includes Cesarean section; Esophagogastroduodenoscopy (egd) with propofol (N/A, 12/07/2017); and Cholecystectomy (N/A, 01/14/2018). Family: family history includes ADD / ADHD in her daughter and mother; Allergies in her daughter and daughter; Arthritis in her brother and paternal grandmother; COPD in her mother; Cancer in her maternal grandmother;  Dementia in her paternal grandmother; Diabetes in her father and mother; Heart attack in her maternal grandfather; Heart disease in her maternal grandfather; Hyperlipidemia in her father; Learning disabilities in her mother; Mental illness in her daughter, father, and sister; Stroke in her maternal grandfather; Thyroid disease in her daughter.  Laboratory Chemistry Profile   Renal Lab Results  Component Value Date   BUN 11 06/30/2022   CREATININE 0.74 06/30/2022   BCR 15 06/30/2022   GFRAA 94 04/16/2020   GFRNONAA 82 04/16/2020     Hepatic Lab Results  Component Value Date   AST 26 06/30/2022   ALT 12 06/30/2022   ALBUMIN 4.2 06/30/2022   ALKPHOS 69 06/30/2022   LIPASE 43 04/10/2017     Electrolytes Lab Results  Component  Value Date   NA 136 06/30/2022   K 4.3 06/30/2022   CL 101 06/30/2022   CALCIUM 9.1 06/30/2022   MG 2.2 07/29/2020     Bone Lab Results  Component Value Date   VD25OH 19.8 (L) 06/30/2022     Inflammation (CRP: Acute Phase) (ESR: Chronic Phase) No results found for: "CRP", "ESRSEDRATE", "LATICACIDVEN"     Note: Above Lab results reviewed.  Recent Imaging Review  LONG TERM MONITOR (3-14 DAYS) HR 62 - 167, average 90 bpm. Rare supraventricular and ventricular ectopy. No atrial fibrillation. No sustained arrhythmias.  Sheria Lang T. Lalla Brothers, MD, [facility], [facility] Cardiac Electrophysiology  Note: Reviewed        Physical Exam  General appearance: Well nourished, well developed, and well hydrated. In no apparent acute distress Mental status: Alert, oriented x 3 (person, place, & time)       Respiratory: No evidence of acute respiratory distress Eyes: PERLA Vitals: BP (!) 109/55   Pulse 93   Temp 97.9 F (36.6 C)   Resp 16   Ht 5\' 1"  (1.549 m)   Wt 156 lb (70.8 kg)   LMP 09/02/2022   SpO2 100%   BMI 29.48 kg/m  BMI: Estimated body mass index is 29.48 kg/m as calculated from the following:   Height as of this encounter: 5\' 1"  (1.549 m).   Weight as of this encounter: 156 lb (70.8 kg). Ideal: Ideal body weight: 47.8 kg (105 lb 6.1 oz) Adjusted ideal body weight: 57 kg (125 lb 10 oz)   Lumbar Spine Area Exam  Skin & Axial Inspection: No masses, redness, or swelling Alignment: Symmetrical Functional ROM: Decreased ROM       Stability: No instability detected Muscle Tone/Strength: Functionally intact. No obvious neuro-muscular anomalies detected.                 Gait & Posture Assessment  Ambulation: Unassisted Gait: Relatively normal for age and body habitus Posture: WNL    Lower Extremity Exam      Side: Right lower extremity   Side: Left lower extremity  Stability: No instability observed           Stability: No instability observed          Skin & Extremity  Inspection: Skin color, temperature, and hair growth are WNL. No peripheral edema or cyanosis. No masses, redness, swelling, asymmetry, or associated skin lesions. No contractures.   Skin & Extremity Inspection: Skin color, temperature, and hair growth are WNL. No peripheral edema or cyanosis. No masses, redness, swelling, asymmetry, or associated skin lesions. No contractures.  Functional ROM: Unrestricted ROM  Functional ROM: Unrestricted ROM                  Muscle Tone/Strength: Functionally intact. No obvious neuro-muscular anomalies detected.   Muscle Tone/Strength: Functionally intact. No obvious neuro-muscular anomalies detected.  Sensory (Neurological): Unimpaired   Sensory (Neurological): Unimpaired  Palpation: No palpable anomalies   Palpation: No palpable anomalies      Assessment   Status Diagnosis  Controlled Controlled Controlled 1. Spondylosis without myelopathy or radiculopathy, lumbar region   2. Fibromyalgia   3. Chronic fatigue   4. Chronic pain syndrome         Plan of Care  Ms. IMAGINE NEST has a current medication list which includes the following long-term medication(s): albuterol and synthroid.  Pharmacotherapy (Medications Ordered): Meds ordered this encounter  Medications   oxyCODONE-acetaminophen (PERCOCET) 5-325 MG tablet    Sig: Take 1 tablet by mouth every 6 (six) hours as needed for severe pain. Must last 30 days.    Dispense:  120 tablet    Refill:  0    [place] STOP ACT - Not applicable. Fill one day early if pharmacy is closed on scheduled refill date.   oxyCODONE-acetaminophen (PERCOCET) 5-325 MG tablet    Sig: Take 1 tablet by mouth every 6 (six) hours as needed for severe pain. Must last 30 days.    Dispense:  120 tablet    Refill:  0    [place] STOP ACT - Not applicable. Fill one day early if pharmacy is closed on scheduled refill date.   oxyCODONE-acetaminophen (PERCOCET) 5-325 MG tablet    Sig: Take 1 tablet by mouth every 6  (six) hours as needed for severe pain. Must last 30 days.    Dispense:  120 tablet    Refill:  0    [place] STOP ACT - Not applicable. Fill one day early if pharmacy is closed on scheduled refill date.    Orders Placed This Encounter  Procedures   Ambulatory referral to Physical Therapy    Referral Priority:   Routine    Referral Type:   Physical Medicine    Referral Reason:   Specialty Services Required    Requested Specialty:   Physical Therapy    Number of Visits Requested:   1   Consider Sprint peripheral nerve stimulation UDS up-to-date and appropriate.  Follow-up plan:   Return in about 3 months (around 12/29/2022) for Medication Management, in person.   Recent Visits No visits were found meeting these conditions. Showing recent visits within past 90 days and meeting all other requirements Today's Visits Date Type Provider Dept  09/28/22 Office Visit Edward Jolly, MD [facility]  Showing today's visits and meeting all other requirements Future Appointments Date Type Provider Dept  12/26/22 Appointment Edward Jolly, MD [facility]  Showing future appointments within next 90 days and meeting all other requirements  I discussed the assessment and treatment plan with the patient. The patient was provided an opportunity to ask questions and all were answered. The patient agreed with the plan and demonstrated an understanding of the instructions.  Patient advised to call back or seek an in-person evaluation if the symptoms or condition worsens.  Duration of encounter: 30 minutes.  Note by: Edward Jolly, MD Date: 09/28/2022; Time: 11:12 AM

## 2022-09-28 NOTE — Progress Notes (Signed)
Nursing Pain Medication Assessment:  Safety precautions to be maintained throughout the outpatient stay will include: orient to surroundings, keep bed in low position, maintain call bell within reach at all times, provide assistance with transfer out of bed and ambulation.  Medication Inspection Compliance: Pill count conducted under aseptic conditions, in front of the patient. Neither the pills nor the bottle was removed from the patient's sight at any time. Once count was completed pills were immediately returned to the patient in their original bottle.  Medication: Oxycodone/APAP Pill/Patch Count:  7 of 120 pills remain Pill/Patch Appearance: Markings consistent with prescribed medication Bottle Appearance: Standard pharmacy container. Clearly labeled. Filled Date: 6 / 20 / 2024 Last Medication intake:  Today

## 2022-10-10 ENCOUNTER — Ambulatory Visit: Payer: 59 | Attending: Family Medicine | Admitting: Physical Therapy

## 2022-10-10 ENCOUNTER — Encounter: Payer: Self-pay | Admitting: Physical Therapy

## 2022-10-10 DIAGNOSIS — R102 Pelvic and perineal pain: Secondary | ICD-10-CM | POA: Insufficient documentation

## 2022-10-10 DIAGNOSIS — G8929 Other chronic pain: Secondary | ICD-10-CM | POA: Insufficient documentation

## 2022-10-10 DIAGNOSIS — M5459 Other low back pain: Secondary | ICD-10-CM | POA: Insufficient documentation

## 2022-10-10 DIAGNOSIS — M545 Low back pain, unspecified: Secondary | ICD-10-CM | POA: Insufficient documentation

## 2022-10-10 DIAGNOSIS — M533 Sacrococcygeal disorders, not elsewhere classified: Secondary | ICD-10-CM | POA: Diagnosis not present

## 2022-10-10 NOTE — Patient Instructions (Signed)
  Lengthen Back rib by L  shoulder ( winging)    Lie on R  side , pillow between knees and under head  Pull  L arm overhead over mattress, grab the edge of mattress,pull it upward, drawing elbow away from ears  Breathing 10 reps  Brushing arm with 3/4 turn onto pillow behind back  Lying on R  side ,Pillow/ Block between knees     dragging top forearm across ribs below breast rotating 3/4 turn,  rotating  _L_ only this week ,  relax onto the pillow behind the back  and then back to other palm , maintain top palm on body whole top and not lift shoulder 10 reps ___   Only this L arm this week   Later you can do both arms

## 2022-10-10 NOTE — Therapy (Signed)
OUTPATIENT PHYSICAL THERAPY EVALUATION   Patient Name: Sharon Rodriguez MRN: [id number] DOB:1980-05-11, 42 y.o., female Today's Date: 10/10/2022   PT End of Session - 10/10/22 1544     Visit Number 1    Number of Visits 10    Date for PT Re-Evaluation 12/19/22    PT Start Time 1545    PT Stop Time 1630    PT Time Calculation (min) 45 min    Activity Tolerance Patient tolerated treatment well    Behavior During Therapy [facility] for tasks assessed/performed             Past Medical History:  Diagnosis Date   ADD (attention deficit disorder)    Anxiety    Asthma    WELL CONTROLLED   Biliary dyskinesia 12/27/2017   Chronic bronchitis (HCC)    Chronic fatigue    Chronic pain    Depression    Diabetes mellitus without complication (HCC)    Fibromyalgia    GERD (gastroesophageal reflux disease)    History of stomach ulcers 2019   Hypothyroidism 01/27/2015   Learning disability    Osteoarthritis    Rheumatoid arthritis (HCC)    Thyroid disease    TMJ (dislocation of temporomandibular joint)    Past Surgical History:  Procedure Laterality Date   CESAREAN SECTION     x 2   CHOLECYSTECTOMY N/A 01/14/2018   Procedure: LAPAROSCOPIC CHOLECYSTECTOMY;  Surgeon: Ancil Linsey, MD;  Location: [facility] ORS;  Service: General;  Laterality: N/A;   ESOPHAGOGASTRODUODENOSCOPY (EGD) WITH PROPOFOL N/A 12/07/2017   Procedure: ESOPHAGOGASTRODUODENOSCOPY (EGD) WITH PROPOFOL;  Surgeon: Wyline Mood, MD;  Location: [facility] ENDOSCOPY;  Service: Gastroenterology;  Laterality: N/A;   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   Chronic diarrhea 10/06/2019   Diabetes mellitus (HCC) 04/12/2019   Gastroesophageal reflux disease 12/27/2017   Chronic bilateral low back pain without sciatica 11/29/2017   Chronic pain syndrome 11/29/2017   Long term current use of opiate analgesic 11/29/2017   Spondylosis without myelopathy or radiculopathy, lumbar region  09/24/2017   Myofascial pain 09/24/2017   Pelvic pain in female 05/10/2017   Dysmenorrhea 05/10/2017   Pubic bone pain 05/10/2017   Controlled substance agreement signed 10/03/2016   Vitamin D deficiency 07/06/2016   Lower limb length difference 02/27/2015   Hypothyroidism 01/27/2015   Chronic pelvic pain in female 01/27/2015   Anxiety    Rheumatoid arthritis (HCC)    Depression    Fibromyalgia    TMJ pain dysfunction syndrome    Chronic bronchitis (HCC)    ADD (attention deficit disorder)    Learning disability    Acne 08/28/2011   Benign neoplasm of skin of trunk 08/28/2011   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    PCP: Laural Benes   REFERRING PROVIDER: Lynelle Smoke DIAG: Pelvic pain in female   Rationale for Evaluation and Treatment Rehabilitation  THERAPY DIAG:  Sacrococcygeal disorders, not elsewhere classified  Other low back pain  ONSET DATE:   SUBJECTIVE:  SUBJECTIVE STATEMENT: 1) LBP non-radiating:  R > L at the SIJ occurs with physical work, lifting groceries and not able to lift 5 gallon honey, walking  pain varies from 7-10/10   2) pelvic pain on the R pubic area :  Pain 10/10 when occurs when she is working and lifting. Pt stopped gardening due to pain. Pt has no pain with urination, no burning, no lowered/ pressure sensation with urination, does not strain with bowel movements.     PERTINENT HISTORY:  2 C-sections  ,   PAIN:  Are you having pain? Yes: see above   PRECAUTIONS: None  WEIGHT BEARING RESTRICTIONS: No  FALLS:  Has patient fallen in last 6 months? No   LIVING ENVIRONMENT: Lives with: lives with their family Lives in: one story  Stairs: 4 STE with rail   OCCUPATION: beekeeper   PLOF: Independent  PATIENT GOALS:  To improve back pain  to be able to lift bee boxes    OBJECTIVE:    [facility] PT Assessment - 10/10/22 1608       AROM   Overall AROM Comments R hip ext 0 deg due to pain , L hip ext 20 deg      Strength   Overall Strength Comments R hip flexion 4/5, L 5/5 , knee flexion/ ext  ,  R hip abd with pain 3/5, L 3+/5 5/5  B      Palpation   Spinal mobility pain created with L sideflexion    SI assessment  L shoulder/ L iliac crest lowered    Palpation comment tightness/ tenderness along L medial scapula / hypomobile L thoracic      Ambulation/Gait   Gait Comments 1.26 m/s, limited posterior rotaiton of L pelvis,            .  [facility] Adult PT Treatment/Exercise - 10/10/22 1608       Modalities   Modalities Moist Heat      Moist Heat Therapy   Number Minutes Moist Heat 3 Minutes    Moist Heat Location --   L thoracic in lower trunk rotation, supported ( unbilled)     Manual Therapy   Manual therapy comments STM/MWM noted in assessment to realign spine              HOME EXERCISE PROGRAM: See pt instruction section    ASSESSMENT:  CLINICAL IMPRESSION:   Pt is a  42 yo  who presents with LBP non-radiating and pelvic pain on the R pubic area which impact QOL, ADL, walking and beekeeping activities.    Pt's musculoskeletal assessment revealed uneven pelvic girdle and shoulder height, asymmetries to gait pattern, limited spinal /pelvic mobility, dyscoordination and strength of pelvic floor mm, hip weakness, poor body mechanics which places strain on the abdominal/pelvic floor mm. These are deficits that indicate an ineffective intraabdominal pressure system associated with increased risk for pt's Sx.    Pt was provided education on etiology of Sx with anatomy, physiology explanation with images along with the benefits of customized pelvic PT Tx based on pt's medical conditions and musculoskeletal deficits.  Explained the physiology of deep core mm coordination and roles of pelvic floor function in  urination, defecation, sexual function, and postural control with deep core mm system.   Following Tx today which pt tolerated without complaints,  pt showed more levelled shoulder/ pelvis post Tx.   Plan to assess realignment of spine/ pelvis at next session to help promote balance and minimize falls.  Plan to address pelvic floor issues once pelvis and spine are realigned to yield better outcomes.   Pt benefits from skilled PT.    OBJECTIVE IMPAIRMENTS decreased activity tolerance, decreased coordination, decreased endurance, decreased mobility, difficulty walking, decreased ROM, decreased strength, decreased safety awareness, hypomobility, increased muscle spasms, impaired flexibility, improper body mechanics, postural dysfunction, and pain. scar restrictions   ACTIVITY LIMITATIONS  self-care,  sleep, home chores, work tasks, beekeeping    PARTICIPATION LIMITATIONS:  community, gym activities    PERSONAL FACTORS        are also affecting patient's functional outcome.    REHAB POTENTIAL: Good   CLINICAL DECISION MAKING: Evolving/moderate complexity   EVALUATION COMPLEXITY: Moderate    PATIENT EDUCATION:    Education details: Showed pt anatomy images. Explained muscles attachments/ connection, physiology of deep core system/ spinal- thoracic-pelvis-lower kinetic chain as they relate to pt's presentation, Sx, and past Hx. Explained what and how these areas of deficits need to be restored to balance and function    See Therapeutic activity / neuromuscular re-education section  Answered pt's questions.   Person educated: Patient Education method: Explanation, Demonstration, Tactile cues, Verbal cues, and Handouts Education comprehension: verbalized understanding, returned demonstration, verbal cues required, tactile cues required, and needs further education     PLAN: PT FREQUENCY: 1x/week   PT DURATION: 10 weeks   PLANNED INTERVENTIONS: Therapeutic exercises, Therapeutic  activity, Neuromuscular re-education, Balance training, Gait training, Patient/Family education, Self Care, Joint mobilization, Spinal mobilization, Moist heat, Taping, and Manual therapy, dry needling.   PLAN FOR NEXT SESSION: See clinical impression for plan     GOALS: Goals reviewed with patient? Yes  SHORT TERM GOALS: Target date: 11/08/2022    Pt will demo IND with HEP                    Baseline: Not IND            Goal status: INITIAL   LONG TERM GOALS: Target date: 12/19/2022    1.Pt will demo proper deep core coordination without chest breathing and optimal excursion of diaphragm/pelvic floor in order to promote spinal stability and pelvic floor function  Baseline: dyscoordination Goal status: INITIAL  2.  Pt will demo > 5 pt change on FOTO  to improve QOL and function   Lumber baseline  - 45 Higher score = better function   Goal status: INITIAL  3.  Pt will demo proper body mechanics in against gravity tasks and ADLs  work tasks, fitness  to minimize straining pelvic floor / back                  Baseline: not IND, improper form that places strain on pelvic floor                Goal status: INITIAL    4. Pt will demo levelled pelvic girdle and shoulder height in order to progress to deep core strengthening HEP and restore mobility at spine, pelvis, gait, posture minimize falls, and improve balance   Baseline: L shoulder/ L iliac crest lowered  Goal status: INITIAL    5. Pt will demo R hip flexion 5/5 and B hip abd > 4/5 without pain in order to improve pelvic stability to minimize pelvic pain and withstand bending and lifting > 40 lb for beekeeping  Baseline: R hip flexion 4/5, L 5/5 , knee flexion/ ext  ,  R hip abd with pain 3/5, L 3+/5 5/5  B  Goal status: INITIAL   6. Pt will gain R hip ext to ~20 deg without pain in order to strengthen posterior chain of mm for walking, standing, bending  Baseline: R hip ext 0 deg due to pain , L hip ext 20 deg  Goal  status: INITIAL    Mariane Masters, PT 10/10/2022, 3:54 PM

## 2022-10-18 ENCOUNTER — Ambulatory Visit: Payer: 59 | Admitting: Physical Therapy

## 2022-10-24 ENCOUNTER — Ambulatory Visit: Payer: 59 | Attending: Family Medicine | Admitting: Physical Therapy

## 2022-10-27 ENCOUNTER — Ambulatory Visit: Payer: 59 | Admitting: Family Medicine

## 2022-10-27 ENCOUNTER — Encounter: Payer: Self-pay | Admitting: Family Medicine

## 2022-10-27 VITALS — BP 109/74 | HR 93 | Temp 97.8°F | Wt 156.0 lb

## 2022-10-27 DIAGNOSIS — K529 Noninfective gastroenteritis and colitis, unspecified: Secondary | ICD-10-CM | POA: Diagnosis not present

## 2022-10-27 DIAGNOSIS — Z7985 Long-term (current) use of injectable non-insulin antidiabetic drugs: Secondary | ICD-10-CM

## 2022-10-27 DIAGNOSIS — E1139 Type 2 diabetes mellitus with other diabetic ophthalmic complication: Secondary | ICD-10-CM

## 2022-10-27 DIAGNOSIS — E039 Hypothyroidism, unspecified: Secondary | ICD-10-CM

## 2022-10-27 LAB — BAYER DCA HB A1C WAIVED: HB A1C (BAYER DCA - WAIVED): 5.5 % (ref 4.8–5.6)

## 2022-10-27 MED ORDER — CHOLESTYRAMINE 4 G PO PACK: 4.0000 g | PACK | Freq: Three times a day (TID) | ORAL | 3 refills | Status: DC

## 2022-10-27 NOTE — Assessment & Plan Note (Signed)
Tolerating her levothyroxine. Rechecking labs today. Await results. Treat as needed.

## 2022-10-27 NOTE — Assessment & Plan Note (Signed)
Doing great with A1c of 5.5. Continue current regimen. Call with any concerns. Refills given.

## 2022-10-27 NOTE — Assessment & Plan Note (Signed)
Will retry [place]. If not better, will get her back in with GI. Call with any concerns.

## 2022-10-27 NOTE — Progress Notes (Signed)
BP 109/74   Pulse 93   Temp 97.8 F (36.6 C) (Oral)   Wt 156 lb (70.8 kg)   LMP 10/19/2022 (Exact Date)   SpO2 98%   BMI 29.48 kg/m    Subjective:    Patient ID: Sharon Rodriguez, female    DOB: 1981-01-20, 42 y.o.   MRN: [id number]  HPI: Sharon Rodriguez is a 42 y.o. female  Chief Complaint  Patient presents with   Diabetes   Hypothyroidism   DIABETES Hypoglycemic episodes:no Polydipsia/polyuria: no Visual disturbance: no Chest pain: no Paresthesias: no Glucose Monitoring: no  Accucheck frequency: Not Checking Taking Insulin?: no Blood Pressure Monitoring: not checking Retinal Examination: Up to Date Foot Exam: Up to Date Diabetic Education: Completed Pneumovax: Up to Date Influenza: Not up to Date Aspirin: no  HYPOTHYROIDISM Thyroid control status:stable Satisfied with current treatment? yes Medication side effects: no Medication compliance: excellent compliance Recent dose adjustment:yes, changed brands Fatigue: yes Cold intolerance: no Heat intolerance: no Weight gain: no Weight loss: no Constipation: no Diarrhea/loose stools: no Palpitations: no Lower extremity edema: no Anxiety/depressed mood: no  Relevant past medical, surgical, family and social history reviewed and updated as indicated. Interim medical history since our last visit reviewed. Allergies and medications reviewed and updated.  Review of Systems  Constitutional: Negative.   Respiratory: Negative.    Cardiovascular: Negative.   Gastrointestinal:  Positive for diarrhea. Negative for abdominal distention, abdominal pain, anal bleeding, blood in stool, constipation, nausea, rectal pain and vomiting.  Musculoskeletal: Negative.   Psychiatric/Behavioral: Negative.      Per HPI unless specifically indicated above     Objective:    BP 109/74   Pulse 93   Temp 97.8 F (36.6 C) (Oral)   Wt 156 lb (70.8 kg)   LMP 10/19/2022 (Exact Date)   SpO2 98%   BMI 29.48 kg/m   Wt  Readings from Last 3 Encounters:  10/27/22 156 lb (70.8 kg)  09/28/22 156 lb (70.8 kg)  09/11/22 156 lb 9.6 oz (71 kg)    Physical Exam Vitals and nursing note reviewed.  Constitutional:      General: She is not in acute distress.    Appearance: Normal appearance. She is not ill-appearing, toxic-appearing or diaphoretic.  HENT:     Head: Normocephalic and atraumatic.     Right Ear: External ear normal.     Left Ear: External ear normal.     Nose: Nose normal.     Mouth/Throat:     Mouth: Mucous membranes are moist.     Pharynx: Oropharynx is clear.  Eyes:     General: No scleral icterus.       Right eye: No discharge.        Left eye: No discharge.     Extraocular Movements: Extraocular movements intact.     Conjunctiva/sclera: Conjunctivae normal.     Pupils: Pupils are equal, round, and reactive to light.  Cardiovascular:     Rate and Rhythm: Normal rate and regular rhythm.     Pulses: Normal pulses.     Heart sounds: Normal heart sounds. No murmur heard.    No friction rub. No gallop.  Pulmonary:     Effort: Pulmonary effort is normal. No respiratory distress.     Breath sounds: Normal breath sounds. No stridor. No wheezing, rhonchi or rales.  Chest:     Chest wall: No tenderness.  Musculoskeletal:        General: Normal range of motion.  Cervical back: Normal range of motion and neck supple.  Skin:    General: Skin is warm and dry.     Capillary Refill: Capillary refill takes less than 2 seconds.     Coloration: Skin is not jaundiced or pale.     Findings: No bruising, erythema, lesion or rash.  Neurological:     General: No focal deficit present.     Mental Status: She is alert and oriented to person, place, and time. Mental status is at baseline.  Psychiatric:        Mood and Affect: Mood normal.        Behavior: Behavior normal.        Thought Content: Thought content normal.        Judgment: Judgment normal.     Results for orders placed or performed  in visit on 09/11/22  TSH  Result Value Ref Range   TSH 0.131 (L) 0.450 - 4.500 uIU/mL      Assessment & Plan:   Problem List Items Addressed This Visit       Digestive   Chronic diarrhea    Will retry [place]. If not better, will get her back in with GI. Call with any concerns.         Endocrine   Hypothyroidism    Tolerating her levothyroxine. Rechecking labs today. Await results. Treat as needed.       Relevant Orders   TSH   Diabetes mellitus (HCC) - Primary    Doing great with A1c of 5.5. Continue current regimen. Call with any concerns. Refills given.       Relevant Orders   Bayer DCA Hb A1c Waived     Follow up plan: Return October for physical.

## 2022-10-28 LAB — TSH: TSH: 0.17 u[IU]/mL — ABNORMAL LOW (ref 0.450–4.500)

## 2022-10-30 ENCOUNTER — Other Ambulatory Visit: Payer: Self-pay | Admitting: Family Medicine

## 2022-10-30 DIAGNOSIS — E039 Hypothyroidism, unspecified: Secondary | ICD-10-CM

## 2022-10-30 MED ORDER — LEVOTHYROXINE SODIUM 75 MCG PO TABS: 75.0000 ug | ORAL_TABLET | Freq: Every day | ORAL | 1 refills | Status: DC

## 2022-10-31 ENCOUNTER — Ambulatory Visit: Payer: 59 | Admitting: Family Medicine

## 2022-11-02 ENCOUNTER — Telehealth: Payer: Self-pay | Admitting: Physical Therapy

## 2022-11-02 ENCOUNTER — Ambulatory Visit: Payer: 59 | Admitting: Physical Therapy

## 2022-11-02 NOTE — Telephone Encounter (Signed)
Physical therapist called pt re: missed appt today. Provided encouragement to keep up with HEP.    We would be grateful if pt can please call to confirm whether they wish to keep or cancel remaining appts [phone].  Explained remaining appts are removed due 2 no-show appts per rehab department policy.   Pt reported she had told the front desk she was not able to make it to the last appt. She forgot about today's appt. Pt was offered an opening today at 1:30 pm but  Pt is without a car and would not be able to make it until Sept. Pt stated she will call to schedule.

## 2022-11-08 ENCOUNTER — Encounter: Payer: 59 | Admitting: Physical Therapy

## 2022-11-24 ENCOUNTER — Encounter: Payer: 59 | Admitting: Physical Therapy

## 2022-12-01 ENCOUNTER — Encounter: Payer: 59 | Admitting: Physical Therapy

## 2022-12-08 ENCOUNTER — Encounter: Payer: 59 | Admitting: Physical Therapy

## 2022-12-15 ENCOUNTER — Other Ambulatory Visit: Payer: Self-pay | Admitting: Family Medicine

## 2022-12-15 ENCOUNTER — Encounter: Payer: 59 | Admitting: Physical Therapy

## 2022-12-15 NOTE — Telephone Encounter (Signed)
Requested Prescriptions  Pending Prescriptions Disp Refills   levothyroxine (SYNTHROID) 75 MCG tablet [Pharmacy Med Name: SYNTHROID 75 MCG TAB] 90 tablet 0    Sig: TAKE 1 TABLET BY MOUTH ONCE DAILY ON AN EMPTY STOMACH. WAIT 30 MINUTES BEFORE TAKING OTHER MEDS.     Endocrinology:  Hypothyroid Agents Failed - 12/15/2022  1:12 PM      Failed - TSH in normal range and within 360 days    TSH  Date Value Ref Range Status  10/27/2022 0.170 (L) 0.450 - 4.500 uIU/mL Final         Passed - Valid encounter within last 12 months    Recent Outpatient Visits           1 month ago Type 2 diabetes mellitus with other ophthalmic complication, without long-term current use of insulin (HCC)   [place] [facility] [place], Megan P, DO   3 months ago Chronic bilateral low back pain without sciatica   [place] [facility] [place], Megan P, DO   4 months ago Chronic bilateral low back pain without sciatica   [place] [facility] [place], Megan P, DO   4 months ago Sore throat   [place] [facility] [place], Megan P, DO   5 months ago Chronic bilateral low back pain without sciatica   [place] [facility] [place], Megan P, DO       Future Appointments             In 4 days Laural Benes, Oralia Rud, DO [place] [facility]             Semaglutide, 1 MG/DOSE, (OZEMPIC, 1 MG/DOSE,) 4 MG/3ML SOPN [Pharmacy Med Name: OZEMPIC (1 MG/DOSE) 4 MG/3ML SUBQ S] 9 mL 0    Sig: INJECT 0.375MLS (0.5MG  TOTAL) SUBCUTANEOUSLY ONCE A WEEK     Endocrinology:  Diabetes - GLP-1 Receptor Agonists - semaglutide Passed - 12/15/2022  1:12 PM      Passed - HBA1C in normal range and within 180 days    Hemoglobin A1C  Date Value Ref Range Status  07/08/2019 7.6  Final   HB A1C (BAYER DCA - WAIVED)  Date Value Ref Range Status  10/27/2022 5.5 4.8 - 5.6 % Final    Comment:             Prediabetes: 5.7 - 6.4          Diabetes:  >6.4          Glycemic control for adults with diabetes: <7.0          Passed - Cr in normal range and within 360 days    Creatinine  Date Value Ref Range Status  03/20/2013 0.86 0.60 - 1.30 mg/dL Final   Creatinine, Ser  Date Value Ref Range Status  06/30/2022 0.74 0.57 - 1.00 mg/dL Final         Passed - Valid encounter within last 6 months    Recent Outpatient Visits           1 month ago Type 2 diabetes mellitus with other ophthalmic complication, without long-term current use of insulin (HCC)   [place] [place], Megan P, DO   3 months ago Chronic bilateral low back pain without sciatica   [place] [facility] [place], Megan P, DO   4 months ago Chronic bilateral low back pain without sciatica   [place] [facility] [place],  Megan P, DO   4 months ago Sore throat   [place] [facility] [place], [place] P, DO   5 months ago Chronic bilateral low back pain without sciatica   [place] [facility] Dorcas Carrow, DO       Future Appointments             In 4 days Dorcas Carrow, DO [place] [facility]

## 2022-12-19 ENCOUNTER — Encounter: Payer: Self-pay | Admitting: Family Medicine

## 2022-12-19 ENCOUNTER — Ambulatory Visit: Payer: 59 | Admitting: Family Medicine

## 2022-12-19 VITALS — BP 133/70 | HR 92 | Ht 61.0 in | Wt 159.0 lb

## 2022-12-19 DIAGNOSIS — F419 Anxiety disorder, unspecified: Secondary | ICD-10-CM

## 2022-12-19 DIAGNOSIS — R002 Palpitations: Secondary | ICD-10-CM | POA: Diagnosis not present

## 2022-12-19 DIAGNOSIS — H9193 Unspecified hearing loss, bilateral: Secondary | ICD-10-CM

## 2022-12-19 DIAGNOSIS — H9313 Tinnitus, bilateral: Secondary | ICD-10-CM | POA: Diagnosis not present

## 2022-12-19 DIAGNOSIS — Z0000 Encounter for general adult medical examination without abnormal findings: Secondary | ICD-10-CM

## 2022-12-19 DIAGNOSIS — F339 Major depressive disorder, recurrent, unspecified: Secondary | ICD-10-CM

## 2022-12-19 DIAGNOSIS — E039 Hypothyroidism, unspecified: Secondary | ICD-10-CM | POA: Diagnosis not present

## 2022-12-19 DIAGNOSIS — E1139 Type 2 diabetes mellitus with other diabetic ophthalmic complication: Secondary | ICD-10-CM | POA: Diagnosis not present

## 2022-12-19 DIAGNOSIS — Z7985 Long-term (current) use of injectable non-insulin antidiabetic drugs: Secondary | ICD-10-CM | POA: Diagnosis not present

## 2022-12-19 DIAGNOSIS — E559 Vitamin D deficiency, unspecified: Secondary | ICD-10-CM | POA: Diagnosis not present

## 2022-12-19 LAB — MICROALBUMIN, URINE WAIVED
Creatinine, Urine Waived: 300 mg/dL (ref 10–300)
Microalb, Ur Waived: 80 mg/L — ABNORMAL HIGH (ref 0–19)

## 2022-12-19 MED ORDER — CHOLESTYRAMINE 4 G PO PACK: 4.0000 g | PACK | Freq: Three times a day (TID) | ORAL | 1 refills | Status: DC

## 2022-12-19 MED ORDER — OZEMPIC (1 MG/DOSE) 4 MG/3ML ~~LOC~~ SOPN: PEN_INJECTOR | SUBCUTANEOUS | 1 refills | Status: DC

## 2022-12-19 MED ORDER — ONDANSETRON HCL 4 MG PO TABS: 4.0000 mg | ORAL_TABLET | Freq: Three times a day (TID) | ORAL | 3 refills | Status: DC | PRN

## 2022-12-19 NOTE — Assessment & Plan Note (Signed)
Rechecking labs today. Await results. Treat as needed.  °

## 2022-12-19 NOTE — Patient Instructions (Signed)
Please call to schedule your mammogram and/or bone density: [facility]  Address: [address], [place], [place] [address] Phone: [phone]  [facility] [place] [address] [place],  [place]  [address] Phone: [phone]   

## 2022-12-19 NOTE — Assessment & Plan Note (Signed)
Not doing great. Will follow up with psychiatry in the next month as scheduled. Continue to monitor.

## 2022-12-19 NOTE — Progress Notes (Signed)
BP 133/70   Pulse 92   Ht 5\' 1"  (1.549 m)   Wt 159 lb (72.1 kg)   SpO2 100%   BMI 30.04 kg/m    Subjective:    Patient ID: Sharon Rodriguez, female    DOB: Nov 26, 1980, 42 y.o.   MRN: [id number]  HPI: Sharon Rodriguez is a 42 y.o. female presenting on 12/19/2022 for comprehensive medical examination. Current medical complaints include:  DIABETES Hypoglycemic episodes:no Polydipsia/polyuria: no Visual disturbance: no Chest pain: no Paresthesias: no Glucose Monitoring: no  Accucheck frequency: Not Checking Taking Insulin?: no Blood Pressure Monitoring: not checking Retinal Examination: Not up to Date Foot Exam: Up to Date Diabetic Education: Completed Pneumovax: Up to Date Influenza:  Declined Aspirin: no  HYPOTHYROIDISM Thyroid control status: unsure Satisfied with current treatment? unsure Medication side effects: no Medication compliance: excellent compliance Recent dose adjustment:yes Fatigue: yes Cold intolerance: yes Heat intolerance: no Weight gain: yes Weight loss: no Constipation: no Diarrhea/loose stools: no Palpitations: no Lower extremity edema: no Anxiety/depressed mood: yes  Mood is acting up. Following with her psychiatrist.   She currently lives with: husband and kids Menopausal Symptoms: no  Depression Screen done today and results listed below:     10/27/2022   11:29 AM 09/28/2022   10:36 AM 08/01/2022    1:17 PM 06/30/2022    1:45 PM 06/20/2022    1:31 PM  Depression screen PHQ 2/9  Decreased Interest 3 0 0 2 0  Down, Depressed, Hopeless 0 0 0 1 0  PHQ - 2 Score 3 0 0 3 0  Altered sleeping 2  0 0 0  Tired, decreased energy 3  0 3 0  Change in appetite 0  0 0 0  Feeling bad or failure about yourself  2  0 0 0  Trouble concentrating 0  0 0 0  Moving slowly or fidgety/restless 2  1 2  0  Suicidal thoughts 0  0 0 0  PHQ-9 Score 12  1 8  0  Difficult doing work/chores Somewhat difficult  Somewhat difficult Somewhat difficult Not difficult  at all    Past Medical History:  Past Medical History:  Diagnosis Date   ADD (attention deficit disorder)    Anxiety    Asthma    WELL CONTROLLED   Biliary dyskinesia 12/27/2017   Chronic bronchitis (HCC)    Chronic fatigue    Chronic pain    Depression    Diabetes mellitus without complication (HCC)    Fibromyalgia    GERD (gastroesophageal reflux disease)    History of stomach ulcers 2019   Hypothyroidism 01/27/2015   Learning disability    Osteoarthritis    Rheumatoid arthritis (HCC)    Thyroid disease    TMJ (dislocation of temporomandibular joint)     Surgical History:  Past Surgical History:  Procedure Laterality Date   CESAREAN SECTION     x 2   CHOLECYSTECTOMY N/A 01/14/2018   Procedure: LAPAROSCOPIC CHOLECYSTECTOMY;  Surgeon: Ancil Linsey, MD;  Location: [facility] ORS;  Service: General;  Laterality: N/A;   ESOPHAGOGASTRODUODENOSCOPY (EGD) WITH PROPOFOL N/A 12/07/2017   Procedure: ESOPHAGOGASTRODUODENOSCOPY (EGD) WITH PROPOFOL;  Surgeon: Wyline Mood, MD;  Location: [facility] ENDOSCOPY;  Service: Gastroenterology;  Laterality: N/A;    Medications:  Current Outpatient Medications on File Prior to Visit  Medication Sig   albuterol (VENTOLIN HFA) 108 (90 Base) MCG/ACT inhaler Inhale 1 puff into the lungs every 6 (six) hours as needed for wheezing or shortness of breath.  amphetamine-dextroamphetamine (ADDERALL) 30 MG tablet Take 1 tablet by mouth 2 (two) times daily.   Blood Glucose Monitoring Suppl (ONE TOUCH ULTRA 2) w/Device KIT 1 each by Does not apply route daily.   EPINEPHrine (EPIPEN 2-PAK) 0.3 mg/0.3 mL IJ SOAJ injection Inject 0.3 mg into the muscle as needed for anaphylaxis (Pateint is a Systems developer.).   glucose blood (ONETOUCH ULTRA) test strip 1 each by Other route daily. Use as instructed   ibuprofen (ADVIL) 800 MG tablet Take 800 mg by mouth 3 (three) times daily.   Insulin Pen Needle (PEN NEEDLES) 32G X 5 MM MISC 1 each by Does not apply route once a week.    Lancets (ONETOUCH ULTRASOFT) lancets 1 each by Other route daily. Use as instructed   levothyroxine (SYNTHROID) 75 MCG tablet TAKE 1 TABLET BY MOUTH ONCE DAILY ON AN EMPTY STOMACH. WAIT 30 MINUTES BEFORE TAKING OTHER MEDS.   oxyCODONE-acetaminophen (PERCOCET) 5-325 MG tablet Take 1 tablet by mouth every 6 (six) hours as needed for severe pain. Must last 30 days.   Vitamin D, Ergocalciferol, (DRISDOL) 1.25 MG (50000 UNIT) CAPS capsule Take 1 capsule (50,000 Units total) by mouth every 7 (seven) days.   No current facility-administered medications on file prior to visit.    Allergies:  Allergies  Allergen Reactions   Ibuprofen Diarrhea and Nausea Only    Other Reaction: GI Upset   Metformin And Related Diarrhea   Promethazine Rash    Social History:  Social History   Socioeconomic History   Marital status: Married    Spouse name: Not on file   Number of children: Not on file   Years of education: Not on file   Highest education level: Not on file  Occupational History   Not on file  Tobacco Use   Smoking status: Never   Smokeless tobacco: Never  Vaping Use   Vaping status: Never Used  Substance and Sexual Activity   Alcohol use: No   Drug use: No   Sexual activity: Not Currently    Birth control/protection: None  Other Topics Concern   Not on file  Social History Narrative   Not on file   Social Determinants of Health   Financial Resource Strain: Low Risk  (06/20/2022)   Overall Financial Resource Strain (CARDIA)    Difficulty of Paying Living Expenses: Not very hard  Food Insecurity: No Food Insecurity (06/20/2022)   Hunger Vital Sign    Worried About Running Out of Food in the Last Year: Never true    Ran Out of Food in the Last Year: Never true  Transportation Needs: No Transportation Needs (06/20/2022)   PRAPARE - Administrator, Civil Service (Medical): No    Lack of Transportation (Non-Medical): No  Physical Activity: Insufficiently Active  (06/20/2022)   Exercise Vital Sign    Days of Exercise per Week: 2 days    Minutes of Exercise per Session: 20 min  Stress: No Stress Concern Present (06/20/2022)   [employer] of Occupational Health - Occupational Stress Questionnaire    Feeling of Stress : Only a little  Social Connections: Socially Integrated (06/20/2022)   Social Connection and Isolation Panel [NHANES]    Frequency of Communication with Friends and Family: More than three times a week    Frequency of Social Gatherings with Friends and Family: Never    Attends Religious Services: More than 4 times per year    Active Member of Clubs or Organizations: Yes  Attends Banker Meetings: More than 4 times per year    Marital Status: Married  Catering manager Violence: Not At Risk (06/20/2022)   Humiliation, Afraid, Rape, and Kick questionnaire    Fear of Current or Ex-Partner: No    Emotionally Abused: No    Physically Abused: No    Sexually Abused: No   Social History   Tobacco Use  Smoking Status Never  Smokeless Tobacco Never   Social History   Substance and Sexual Activity  Alcohol Use No    Family History:  Family History  Problem Relation Age of Onset   Diabetes Mother    ADD / ADHD Mother    Learning disabilities Mother    COPD Mother    Diabetes Father    Hyperlipidemia Father    Mental illness Father        Depression/anxiety   Mental illness Sister        Depression/Anxiety   Arthritis Brother        Rheumatoid   Mental illness Daughter        anxiety   Thyroid disease Daughter    ADD / ADHD Daughter    Allergies Daughter    Cancer Maternal Grandmother        Skin, Lung   Heart disease Maternal Grandfather    Stroke Maternal Grandfather    Heart attack Maternal Grandfather    Arthritis Paternal Grandmother        Rheumatoid   Dementia Paternal Grandmother    Allergies Daughter     Past medical history, surgical history, medications, allergies, family history and  social history reviewed with patient today and changes made to appropriate areas of the chart.   Review of Systems  Constitutional:  Positive for chills. Negative for diaphoresis, fever, malaise/fatigue and weight loss.  HENT:  Positive for hearing loss and tinnitus. Negative for congestion, ear discharge, ear pain, nosebleeds, sinus pain and sore throat.   Eyes:  Positive for blurred vision. Negative for double vision, photophobia, pain, discharge and redness.  Respiratory:  Positive for cough and wheezing. Negative for hemoptysis, sputum production, shortness of breath and stridor.   Cardiovascular:  Positive for palpitations. Negative for chest pain, orthopnea, claudication, leg swelling and PND.  Gastrointestinal:  Positive for abdominal pain, diarrhea and vomiting. Negative for blood in stool, constipation, heartburn, melena and nausea.  Genitourinary: Negative.   Musculoskeletal:  Positive for myalgias. Negative for back pain, falls, joint pain and neck pain.  Skin: Negative.   Neurological: Negative.   Endo/Heme/Allergies: Negative.   Psychiatric/Behavioral:  Positive for depression. Negative for hallucinations, memory loss, substance abuse and suicidal ideas. The patient is nervous/anxious. The patient does not have insomnia.    All other ROS negative except what is listed above and in the HPI.      Objective:    BP 133/70   Pulse 92   Ht 5\' 1"  (1.549 m)   Wt 159 lb (72.1 kg)   SpO2 100%   BMI 30.04 kg/m   Wt Readings from Last 3 Encounters:  12/19/22 159 lb (72.1 kg)  10/27/22 156 lb (70.8 kg)  09/28/22 156 lb (70.8 kg)    Physical Exam Vitals and nursing note reviewed.  Constitutional:      General: She is not in acute distress.    Appearance: Normal appearance. She is obese. She is not ill-appearing, toxic-appearing or diaphoretic.  HENT:     Head: Normocephalic and atraumatic.     Right Ear:  Tympanic membrane, ear canal and external ear normal. There is no impacted  cerumen.     Left Ear: Tympanic membrane, ear canal and external ear normal. There is no impacted cerumen.     Nose: Nose normal. No congestion or rhinorrhea.     Mouth/Throat:     Mouth: Mucous membranes are moist.     Pharynx: Oropharynx is clear. No oropharyngeal exudate or posterior oropharyngeal erythema.  Eyes:     General: No scleral icterus.       Right eye: No discharge.        Left eye: No discharge.     Extraocular Movements: Extraocular movements intact.     Conjunctiva/sclera: Conjunctivae normal.     Pupils: Pupils are equal, round, and reactive to light.  Neck:     Vascular: No carotid bruit.  Cardiovascular:     Rate and Rhythm: Normal rate and regular rhythm.     Pulses: Normal pulses.     Heart sounds: No murmur heard.    No friction rub. No gallop.  Pulmonary:     Effort: Pulmonary effort is normal. No respiratory distress.     Breath sounds: Normal breath sounds. No stridor. No wheezing, rhonchi or rales.  Chest:     Chest wall: No tenderness.  Abdominal:     General: Abdomen is flat. Bowel sounds are normal. There is no distension.     Palpations: Abdomen is soft. There is no mass.     Tenderness: There is no abdominal tenderness. There is no right CVA tenderness, left CVA tenderness, guarding or rebound.     Hernia: No hernia is present.  Genitourinary:    Comments: Breast and pelvic exams deferred with shared decision making Musculoskeletal:        General: No swelling, tenderness, deformity or signs of injury.     Cervical back: Normal range of motion and neck supple. No rigidity. No muscular tenderness.     Right lower leg: No edema.     Left lower leg: No edema.  Lymphadenopathy:     Cervical: No cervical adenopathy.  Skin:    General: Skin is warm and dry.     Capillary Refill: Capillary refill takes less than 2 seconds.     Coloration: Skin is not jaundiced or pale.     Findings: No bruising, erythema, lesion or rash.  Neurological:      General: No focal deficit present.     Mental Status: She is alert and oriented to person, place, and time. Mental status is at baseline.     Cranial Nerves: No cranial nerve deficit.     Sensory: No sensory deficit.     Motor: No weakness.     Coordination: Coordination normal.     Gait: Gait normal.     Deep Tendon Reflexes: Reflexes normal.  Psychiatric:        Mood and Affect: Mood normal.        Behavior: Behavior normal.        Thought Content: Thought content normal.        Judgment: Judgment normal.     Results for orders placed or performed in visit on 10/27/22  Bayer DCA Hb A1c Waived  Result Value Ref Range   HB A1C (BAYER DCA - WAIVED) 5.5 4.8 - 5.6 %  TSH  Result Value Ref Range   TSH 0.170 (L) 0.450 - 4.500 uIU/mL      Assessment & Plan:   Problem List Items  Addressed This Visit       Endocrine   Hypothyroidism    Rechecking labs today. Await results. Treat as needed.       Diabetes mellitus (HCC)    Rechecking labs today. Await results. Treat as needed.       Relevant Medications   Semaglutide, 1 MG/DOSE, (OZEMPIC, 1 MG/DOSE,) 4 MG/3ML SOPN   Other Relevant Orders   Microalbumin, Urine Waived     Other   Anxiety    Not doing great. Will follow up with psychiatry in the next month as scheduled. Continue to monitor.       Depression    Not doing great. Will follow up with psychiatry in the next month as scheduled. Continue to monitor.       Vitamin D deficiency    Rechecking labs today. Await results. Treat as needed.       Relevant Orders   VITAMIN D 25 Hydroxy (Vit-D Deficiency, Fractures)   Other Visit Diagnoses     Routine general medical examination at a health care facility    -  Primary   Vaccines up to date/declined. Screening labs checked today. Pap through GYN. Continue diet and exercise. Call with any concerns.   Relevant Orders   CBC with Differential/Platelet   Comprehensive metabolic panel   Lipid Panel w/o Chol/HDL Ratio    TSH   Palpitations       Holter showed no sustained issues in May. She would like to get into see cardiology. Rechecking labs today. Referral placed.   Relevant Orders   Ambulatory referral to Cardiology   Bilateral hearing loss, unspecified hearing loss type       Referral placed today to audiology.   Relevant Orders   Ambulatory referral to Audiology   Tinnitus of both ears       Referral placed today to audiology.   Relevant Orders   Ambulatory referral to Audiology        Follow up plan: Return in about 3 months (around 03/21/2023).   LABORATORY TESTING:  - Pap smear: done elsewhere- with Dr. Valentino Saxon  IMMUNIZATIONS:   - Tdap: Tetanus vaccination status reviewed: last tetanus booster within 10 years. - Influenza: Refused - Pneumovax: Up to date - Prevnar: Not applicable - COVID: Refused - HPV: Not applicable - Shingrix vaccine: Not applicable  SCREENING: -Mammogram: Ordered today  - Colonoscopy: Not applicable    PATIENT COUNSELING:   Advised to take 1 mg of folate supplement per day if capable of pregnancy.   Sexuality: Discussed sexually transmitted diseases, partner selection, use of condoms, avoidance of unintended pregnancy  and contraceptive alternatives.   Advised to avoid cigarette smoking.  I discussed with the patient that most people either abstain from alcohol or drink within safe limits (<=14/week and <=4 drinks/occasion for males, <=7/weeks and <= 3 drinks/occasion for females) and that the risk for alcohol disorders and other health effects rises proportionally with the number of drinks per week and how often a drinker exceeds daily limits.  Discussed cessation/primary prevention of drug use and availability of treatment for abuse.   Diet: Encouraged to adjust caloric intake to maintain  or achieve ideal body weight, to reduce intake of dietary saturated fat and total fat, to limit sodium intake by avoiding high sodium foods and not adding table  salt, and to maintain adequate dietary potassium and calcium preferably from fresh fruits, vegetables, and low-fat dairy products.    stressed the importance of regular exercise  Injury  prevention: Discussed safety belts, safety helmets, smoke detector, smoking near bedding or upholstery.   Dental health: Discussed importance of regular tooth brushing, flossing, and dental visits.    NEXT PREVENTATIVE PHYSICAL DUE IN 1 YEAR. Return in about 3 months (around 03/21/2023).

## 2022-12-20 LAB — COMPREHENSIVE METABOLIC PANEL
ALT: 12 [IU]/L (ref 0–32)
AST: 27 [IU]/L (ref 0–40)
Albumin: 4 g/dL (ref 3.9–4.9)
Alkaline Phosphatase: 71 [IU]/L (ref 44–121)
BUN/Creatinine Ratio: 13 (ref 9–23)
BUN: 10 mg/dL (ref 6–24)
Bilirubin Total: 0.4 mg/dL (ref 0.0–1.2)
CO2: 21 mmol/L (ref 20–29)
Calcium: 8.7 mg/dL (ref 8.7–10.2)
Chloride: 103 mmol/L (ref 96–106)
Creatinine, Ser: 0.76 mg/dL (ref 0.57–1.00)
Globulin, Total: 3 g/dL (ref 1.5–4.5)
Glucose: 105 mg/dL — ABNORMAL HIGH (ref 70–99)
Potassium: 4.3 mmol/L (ref 3.5–5.2)
Sodium: 138 mmol/L (ref 134–144)
Total Protein: 7 g/dL (ref 6.0–8.5)
eGFR: 99 mL/min/{1.73_m2} (ref >59)

## 2022-12-20 LAB — CBC WITH DIFFERENTIAL/PLATELET
Basophils Absolute: 0.1 10*3/uL (ref 0.0–0.2)
Basos: 1 %
EOS (ABSOLUTE): 0.1 10*3/uL (ref 0.0–0.4)
Eos: 1 %
Hematocrit: 42.5 % (ref 34.0–46.6)
Hemoglobin: 13.8 g/dL (ref 11.1–15.9)
Immature Grans (Abs): 0.1 10*3/uL (ref 0.0–0.1)
Immature Granulocytes: 1 %
Lymphocytes Absolute: 2.9 10*3/uL (ref 0.7–3.1)
Lymphs: 31 %
MCH: 28.2 pg (ref 26.6–33.0)
MCHC: 32.5 g/dL (ref 31.5–35.7)
MCV: 87 fL (ref 79–97)
Monocytes Absolute: 0.4 10*3/uL (ref 0.1–0.9)
Monocytes: 4 %
Neutrophils Absolute: 5.8 10*3/uL (ref 1.4–7.0)
Neutrophils: 62 %
Platelets: 249 10*3/uL (ref 150–450)
RBC: 4.9 x10E6/uL (ref 3.77–5.28)
RDW: 12.9 % (ref 11.7–15.4)
WBC: 9.4 10*3/uL (ref 3.4–10.8)

## 2022-12-20 LAB — LIPID PANEL W/O CHOL/HDL RATIO
Cholesterol, Total: 138 mg/dL (ref 100–199)
HDL: 37 mg/dL — ABNORMAL LOW (ref >39)
LDL Chol Calc (NIH): 68 mg/dL (ref 0–99)
Triglycerides: 197 mg/dL — ABNORMAL HIGH (ref 0–149)
VLDL Cholesterol Cal: 33 mg/dL (ref 5–40)

## 2022-12-20 LAB — VITAMIN D 25 HYDROXY (VIT D DEFICIENCY, FRACTURES): Vit D, 25-Hydroxy: 33.5 ng/mL (ref 30.0–100.0)

## 2022-12-20 LAB — TSH: TSH: 0.096 u[IU]/mL — ABNORMAL LOW (ref 0.450–4.500)

## 2022-12-22 ENCOUNTER — Encounter: Payer: Self-pay | Admitting: Family Medicine

## 2022-12-24 ENCOUNTER — Other Ambulatory Visit: Payer: Self-pay | Admitting: Family Medicine

## 2022-12-24 DIAGNOSIS — E039 Hypothyroidism, unspecified: Secondary | ICD-10-CM

## 2022-12-24 MED ORDER — LEVOTHYROXINE SODIUM 50 MCG PO TABS: 50.0000 ug | ORAL_TABLET | Freq: Every day | ORAL | 1 refills | Status: DC

## 2022-12-26 ENCOUNTER — Encounter: Payer: Self-pay | Admitting: Student in an Organized Health Care Education/Training Program

## 2022-12-26 ENCOUNTER — Ambulatory Visit
Payer: 59 | Attending: Student in an Organized Health Care Education/Training Program | Admitting: Student in an Organized Health Care Education/Training Program

## 2022-12-26 DIAGNOSIS — M47816 Spondylosis without myelopathy or radiculopathy, lumbar region: Secondary | ICD-10-CM | POA: Diagnosis not present

## 2022-12-26 DIAGNOSIS — G894 Chronic pain syndrome: Secondary | ICD-10-CM | POA: Diagnosis not present

## 2022-12-26 DIAGNOSIS — R5382 Chronic fatigue, unspecified: Secondary | ICD-10-CM | POA: Insufficient documentation

## 2022-12-26 MED ORDER — OXYCODONE-ACETAMINOPHEN 5-325 MG PO TABS: 1.0000 | ORAL_TABLET | Freq: Four times a day (QID) | ORAL | 0 refills | Status: AC | PRN

## 2022-12-26 MED ORDER — OXYCODONE-ACETAMINOPHEN 5-325 MG PO TABS: 1.0000 | ORAL_TABLET | Freq: Four times a day (QID) | ORAL | 0 refills | Status: DC | PRN

## 2022-12-26 NOTE — Progress Notes (Signed)
PROVIDER NOTE: Information contained herein reflects review and annotations entered in association with encounter. Interpretation of such information and data should be left to medically-trained personnel. Information provided to patient can be located elsewhere in the medical record under "Patient Instructions". Document created using STT-dictation technology, any transcriptional errors that may result from process are unintentional.    Patient: Sharon Rodriguez  Service Category: E/M  Provider: Edward Jolly, MD  DOB: 10-06-1980  DOS: 12/26/2022  Specialty: Interventional Pain Management  MRN: [id number]  Setting: Ambulatory outpatient  PCP: Dorcas Carrow, DO  Type: Established Patient    Referring Provider: Dorcas Carrow, DO  Location: Office  Delivery: Face-to-face     HPI  Sharon Rodriguez, a 42 y.o. year old female, is here today because of her No primary diagnosis found.. Sharon Rodriguez primary complain today is Pain ("All over r/t fibromyalgia") and Back Pain  Last encounter: My last encounter with her was on 09/28/22  Pertinent problems: Sharon Rodriguez has Hypothyroidism; Rheumatoid arthritis (HCC); Depression; Fibromyalgia; TMJ pain dysfunction syndrome; H/O juvenile rheumatoid arthritis; Spondylosis without myelopathy or radiculopathy, lumbar region; Chronic bilateral low back pain without sciatica; Chronic pain syndrome; and Long term current use of opiate analgesic on their pertinent problem list. Pain Assessment: Severity of Chronic pain is reported as a 7 /10. Location: Back Lower/denies. Onset: More than a month ago. Quality: Spasm, Throbbing. Timing: Constant. Modifying factor(s): meds. Vitals:  height is 5\' 1"  (1.549 m) and weight is 159 lb (72.1 kg). Her temperature is 97.6 F (36.4 C). Her blood pressure is 103/72 and her pulse is 102 (abnormal). Her respiration is 16 and oxygen saturation is 97%.   Reason for encounter: medication management.    No change in medical history  since last visit.  Patient's pain is at baseline.  Patient continues multimodal pain regimen as prescribed.  States that it provides pain relief and improvement in functional status.   Pharmacotherapy Assessment  Analgesic: Percocet 5 mg 4 times daily as needed, quantity 120/month; MME equals 30    Monitoring: [place] PMP: PDMP reviewed during this encounter.       Pharmacotherapy: No side-effects or adverse reactions reported. Compliance: No problems identified. Effectiveness: Clinically acceptable.  UDS:  Summary  Date Value Ref Range Status  06/27/2022 Note  Final    Comment:    ==================================================================== [employer] Select 13 (MW) ==================================================================== Test                             Result       Flag       Units  Drug Present and Declared for Prescription Verification   Oxycodone                      1308         EXPECTED   ng/mg creat   Oxymorphone                    207          EXPECTED   ng/mg creat   Noroxycodone                   1342         EXPECTED   ng/mg creat   Noroxymorphone                 128  EXPECTED   ng/mg creat    Sources of oxycodone are scheduled prescription medications.    Oxymorphone, noroxycodone, and noroxymorphone are expected    metabolites of oxycodone. Oxymorphone is also available as a    scheduled prescription medication.  Drug Absent but Declared for Prescription Verification   Amphetamine                    Not Detected UNEXPECTED ng/mg creat ==================================================================== Test                      Result    Flag   Units      Ref Range   Creatinine              221              mg/dL      >=95 ==================================================================== Declared Medications:  The flagging and interpretation on this report are based on the  following declared medications.  Unexpected results may arise  from  inaccuracies in the declared medications.   **Note: The testing scope of this panel includes these medications:   Amphetamine (Adderall)  Oxycodone (Percocet)   **Note: The testing scope of this panel does not include the  following reported medications:   Acetaminophen (Percocet)  Albuterol (Ventolin HFA)  Epinephrine (EpiPen)  Ondansetron (Zofran)  Semaglutide (Ozempic)  Thyroid: Liothyronine/Levothyroxine ([place]) ==================================================================== For clinical consultation, please call [phone]. ====================================================================       ROS  Constitutional: Denies any fever or chills Gastrointestinal: No reported hemesis, hematochezia, vomiting, or acute GI distress Musculoskeletal:  diffuse MSK pain, low back pain Neurological: No reported episodes of acute onset apraxia, aphasia, dysarthria, agnosia, amnesia, paralysis, loss of coordination, or loss of consciousness  Medication Review  EPINEPHrine, ONE TOUCH ULTRA 2, Pen Needles, Semaglutide (1 MG/DOSE), Vitamin D (Ergocalciferol), albuterol, amphetamine-dextroamphetamine, cholestyramine, glucose blood, ibuprofen, levothyroxine, ondansetron, onetouch ultrasoft, and oxyCODONE-acetaminophen  History Review  Allergy: Sharon Rodriguez is allergic to ibuprofen, metformin and related, and promethazine. Drug: Sharon Rodriguez  reports no history of drug use. Alcohol:  reports no history of alcohol use. Tobacco:  reports that she has never smoked. She has never used smokeless tobacco. Social: Sharon Rodriguez  reports that she has never smoked. She has never used smokeless tobacco. She reports that she does not drink alcohol and does not use drugs. Medical:  has a past medical history of ADD (attention deficit disorder), Anxiety, Asthma, Biliary dyskinesia (12/27/2017), Chronic bronchitis (HCC), Chronic fatigue, Chronic pain, Depression, Diabetes mellitus without  complication (HCC), Fibromyalgia, GERD (gastroesophageal reflux disease), History of stomach ulcers (2019), Hypothyroidism (01/27/2015), Learning disability, Osteoarthritis, Rheumatoid arthritis (HCC), Thyroid disease, and TMJ (dislocation of temporomandibular joint). Surgical: Ms. Marteney  has a past surgical history that includes Cesarean section; Esophagogastroduodenoscopy (egd) with propofol (N/A, 12/07/2017); and Cholecystectomy (N/A, 01/14/2018). Family: family history includes ADD / ADHD in her daughter and mother; Allergies in her daughter and daughter; Arthritis in her brother and paternal grandmother; COPD in her mother; Cancer in her maternal grandmother; Dementia in her paternal grandmother; Diabetes in her father and mother; Heart attack in her maternal grandfather; Heart disease in her maternal grandfather; Hyperlipidemia in her father; Learning disabilities in her mother; Mental illness in her daughter, father, and sister; Stroke in her maternal grandfather; Thyroid disease in her daughter.  Laboratory Chemistry Profile   Renal Lab Results  Component Value Date   BUN 10 12/19/2022   CREATININE 0.76 12/19/2022   BCR 13 12/19/2022  GFRAA 94 04/16/2020   GFRNONAA 82 04/16/2020     Hepatic Lab Results  Component Value Date   AST 27 12/19/2022   ALT 12 12/19/2022   ALBUMIN 4.0 12/19/2022   ALKPHOS 71 12/19/2022   LIPASE 43 04/10/2017     Electrolytes Lab Results  Component Value Date   NA 138 12/19/2022   K 4.3 12/19/2022   CL 103 12/19/2022   CALCIUM 8.7 12/19/2022   MG 2.2 07/29/2020     Bone Lab Results  Component Value Date   VD25OH 33.5 12/19/2022     Inflammation (CRP: Acute Phase) (ESR: Chronic Phase) No results found for: "CRP", "ESRSEDRATE", "LATICACIDVEN"     Note: Above Lab results reviewed.  Recent Imaging Review  LONG TERM MONITOR (3-14 DAYS) HR 62 - 167, average 90 bpm. Rare supraventricular and ventricular ectopy. No atrial fibrillation. No  sustained arrhythmias.  Sheria Lang T. Lalla Brothers, MD, [facility], [facility] Cardiac Electrophysiology  Note: Reviewed        Physical Exam  General appearance: Well nourished, well developed, and well hydrated. In no apparent acute distress Mental status: Alert, oriented x 3 (person, place, & time)       Respiratory: No evidence of acute respiratory distress Eyes: PERLA Vitals: BP 103/72   Pulse (!) 102   Temp 97.6 F (36.4 C)   Resp 16   Ht 5\' 1"  (1.549 m)   Wt 159 lb (72.1 kg)   LMP 12/07/2022   SpO2 97%   BMI 30.04 kg/m  BMI: Estimated body mass index is 30.04 kg/m as calculated from the following:   Height as of this encounter: 5\' 1"  (1.549 m).   Weight as of this encounter: 159 lb (72.1 kg). Ideal: Ideal body weight: 47.8 kg (105 lb 6.1 oz) Adjusted ideal body weight: 57.5 kg (126 lb 13.2 oz)   Lumbar Spine Area Exam  Skin & Axial Inspection: No masses, redness, or swelling Alignment: Symmetrical Functional ROM: Decreased ROM       Stability: No instability detected Muscle Tone/Strength: Functionally intact. No obvious neuro-muscular anomalies detected.                 Gait & Posture Assessment  Ambulation: Unassisted Gait: Relatively normal for age and body habitus Posture: WNL    Lower Extremity Exam      Side: Right lower extremity   Side: Left lower extremity  Stability: No instability observed           Stability: No instability observed          Skin & Extremity Inspection: Skin color, temperature, and hair growth are WNL. No peripheral edema or cyanosis. No masses, redness, swelling, asymmetry, or associated skin lesions. No contractures.   Skin & Extremity Inspection: Skin color, temperature, and hair growth are WNL. No peripheral edema or cyanosis. No masses, redness, swelling, asymmetry, or associated skin lesions. No contractures.  Functional ROM: Unrestricted ROM                   Functional ROM: Unrestricted ROM                  Muscle Tone/Strength: Functionally  intact. No obvious neuro-muscular anomalies detected.   Muscle Tone/Strength: Functionally intact. No obvious neuro-muscular anomalies detected.  Sensory (Neurological): Unimpaired   Sensory (Neurological): Unimpaired  Palpation: No palpable anomalies   Palpation: No palpable anomalies      Assessment   Status Diagnosis  Controlled Controlled Controlled 1. Spondylosis without myelopathy or  radiculopathy, lumbar region   2. Chronic fatigue   3. Chronic pain syndrome          Plan of Care  Ms. LADORIS LYTHGOE has a current medication list which includes the following long-term medication(s): albuterol, levothyroxine, and cholestyramine.  Pharmacotherapy (Medications Ordered): Meds ordered this encounter  Medications   oxyCODONE-acetaminophen (PERCOCET) 5-325 MG tablet    Sig: Take 1 tablet by mouth every 6 (six) hours as needed for severe pain (pain score 7-10). Must last 30 days.    Dispense:  120 tablet    Refill:  0    [place] STOP ACT - Not applicable. Fill one day early if pharmacy is closed on scheduled refill date.   oxyCODONE-acetaminophen (PERCOCET) 5-325 MG tablet    Sig: Take 1 tablet by mouth every 6 (six) hours as needed for severe pain (pain score 7-10). Must last 30 days.    Dispense:  120 tablet    Refill:  0    [place] STOP ACT - Not applicable. Fill one day early if pharmacy is closed on scheduled refill date.   oxyCODONE-acetaminophen (PERCOCET) 5-325 MG tablet    Sig: Take 1 tablet by mouth every 6 (six) hours as needed for severe pain (pain score 7-10). Must last 30 days.    Dispense:  120 tablet    Refill:  0     STOP ACT - Not applicable. Fill one day early if pharmacy is closed on scheduled refill date.    No orders of the defined types were placed in this encounter.  Consider Sprint peripheral nerve stimulation UDS up-to-date and appropriate.  Follow-up plan:   Return in about 3 months (around 03/28/2023) for MM, F2F.   Recent Visits Date Type  Provider Dept  09/28/22 Office Visit Edward Jolly, MD [facility]  Showing recent visits within past 90 days and meeting all other requirements Today's Visits Date Type Provider Dept  12/26/22 Office Visit Edward Jolly, MD [facility]  Showing today's visits and meeting all other requirements Future Appointments No visits were found meeting these conditions. Showing future appointments within next 90 days and meeting all other requirements  I discussed the assessment and treatment plan with the patient. The patient was provided an opportunity to ask questions and all were answered. The patient agreed with the plan and demonstrated an understanding of the instructions.  Patient advised to call back or seek an in-person evaluation if the symptoms or condition worsens.  Duration of encounter: 30 minutes.  Note by: Edward Jolly, MD Date: 12/26/2022; Time: 10:45 AM

## 2022-12-26 NOTE — Progress Notes (Signed)
Nursing Pain Medication Assessment:  Safety precautions to be maintained throughout the outpatient stay will include: orient to surroundings, keep bed in low position, maintain call bell within reach at all times, provide assistance with transfer out of bed and ambulation.  Medication Inspection Compliance: Pill count conducted under aseptic conditions, in front of the patient. Neither the pills nor the bottle was removed from the patient's sight at any time. Once count was completed pills were immediately returned to the patient in their original bottle.  Medication: Oxycodone/APAP Pill/Patch Count:  12 of 120 pills remain Pill/Patch Appearance: Markings consistent with prescribed medication Bottle Appearance: Standard pharmacy container. Clearly labeled. Filled Date: 7 / 18 / 2024 Last Medication intake:  Today

## 2023-01-02 MED ORDER — FREESTYLE LIBRE 3 SENSOR MISC: 1.0000 | 12 refills | Status: AC

## 2023-01-05 DIAGNOSIS — H903 Sensorineural hearing loss, bilateral: Secondary | ICD-10-CM | POA: Diagnosis not present

## 2023-01-12 NOTE — Progress Notes (Signed)
Pharmacy Quality Measure Review  This patient is appearing on report for being at risk of failing the measure for Statin Use in Persons with Diabetes (SUPD) medications this calendar year.   Prior trials of: No documented statin/zetia trials in EMR   Currently on -Cholestyramine 4 g tid   The 10-year ASCVD risk score (Arnett DK, et al., 2019) is: 1%   Values used to calculate the score:     Age: 42 years     Sex: Female     Is Non-Hispanic African American: No     Diabetic: Yes     Tobacco smoker: No     Systolic Blood Pressure: 103 mmHg     Is BP treated: No     HDL Cholesterol: 37 mg/dL     Total Cholesterol: 138 mg/dL   Last lipid panel (56/2130) notable for elevated TG (197), low HDL (37). LDL <70.   Consider risks-benefits discussion for moderate intensity statin for additional lipid lowering benefit with patient in the future.   Sofie Rower, PharmD [facility] PGY-1

## 2023-01-18 DIAGNOSIS — H905 Unspecified sensorineural hearing loss: Secondary | ICD-10-CM | POA: Diagnosis not present

## 2023-01-24 ENCOUNTER — Other Ambulatory Visit: Payer: Self-pay | Admitting: Family Medicine

## 2023-01-25 NOTE — Telephone Encounter (Signed)
Requested medications are due for refill today.  no  Requested medications are on the active medications list.  yes  Last refill. 12/24/2022 #30 1 rf  Future visit scheduled.   yes  Notes to clinic.  Abnormal labs.    Requested Prescriptions  Pending Prescriptions Disp Refills   SYNTHROID 50 MCG tablet [Pharmacy Med Name: SYNTHROID 50 MCG TAB] 30 tablet 1    Sig: TAKE 1 TABLET BY MOUTH ONCE DAILY ON AN EMPTY STOMACH. WAIT 30 MINUTES BEFORE TAKING OTHER MEDS.     Endocrinology:  Hypothyroid Agents Failed - 01/24/2023  9:51 AM      Failed - TSH in normal range and within 360 days    TSH  Date Value Ref Range Status  12/19/2022 0.096 (L) 0.450 - 4.500 uIU/mL Final         Passed - Valid encounter within last 12 months    Recent Outpatient Visits           1 month ago Routine general medical examination at a health care facility   [facility] [place] P, DO   3 months ago Type 2 diabetes mellitus with other ophthalmic complication, without long-term current use of insulin (HCC)   [place] [facility] [place], Megan P, DO   4 months ago Chronic bilateral low back pain without sciatica   [place] [facility] [place], Megan P, DO   5 months ago Chronic bilateral low back pain without sciatica   [place] [facility] [place], Megan P, DO   5 months ago Sore throat   [place] [facility] [place], Oralia Rud, DO       Future Appointments             In 2 months Kirke Corin, Chelsea Aus, MD [facility] at [facility]

## 2023-01-25 NOTE — Telephone Encounter (Signed)
Due for labs before refills

## 2023-01-26 ENCOUNTER — Encounter: Payer: Self-pay | Admitting: Family Medicine

## 2023-01-26 ENCOUNTER — Telehealth: Payer: Self-pay | Admitting: Family Medicine

## 2023-01-26 NOTE — Telephone Encounter (Signed)
Called and notified patient of Dr. Henriette Combs message. Lab appointment moved to 03/12/23 as patient states she just picked up her medication.

## 2023-01-26 NOTE — Telephone Encounter (Signed)
She got labs 10/8 and should have enough refills until 12/8 based on her 30 days with a refill. She does not need to come in before 11/25 if she doesn't want to- but she will need labs before she gets a refill to make sure she's on the right dose. She shouldn't need a new one today- she should have one at the pharmacy waiting for her.

## 2023-01-27 ENCOUNTER — Other Ambulatory Visit: Payer: Self-pay | Admitting: Student in an Organized Health Care Education/Training Program

## 2023-01-27 DIAGNOSIS — M47816 Spondylosis without myelopathy or radiculopathy, lumbar region: Secondary | ICD-10-CM

## 2023-01-27 DIAGNOSIS — G894 Chronic pain syndrome: Secondary | ICD-10-CM

## 2023-01-27 DIAGNOSIS — R5382 Chronic fatigue, unspecified: Secondary | ICD-10-CM

## 2023-01-29 NOTE — Telephone Encounter (Signed)
Please see other message on same subject

## 2023-01-30 NOTE — Telephone Encounter (Signed)
Created in error

## 2023-02-01 ENCOUNTER — Other Ambulatory Visit: Payer: 59

## 2023-02-09 ENCOUNTER — Other Ambulatory Visit: Payer: 59

## 2023-02-24 ENCOUNTER — Other Ambulatory Visit: Payer: Self-pay | Admitting: Family Medicine

## 2023-02-26 ENCOUNTER — Encounter: Payer: Self-pay | Admitting: Family Medicine

## 2023-02-26 ENCOUNTER — Other Ambulatory Visit: Payer: Self-pay | Admitting: Family Medicine

## 2023-02-26 MED ORDER — LEVOTHYROXINE SODIUM 50 MCG PO TABS: 50.0000 ug | ORAL_TABLET | Freq: Every day | ORAL | 0 refills | Status: DC

## 2023-02-26 NOTE — Telephone Encounter (Signed)
 Care team updated and letter sent for eye exam notes.

## 2023-02-26 NOTE — Telephone Encounter (Signed)
Requested medication (s) are due for refill today: yes  Requested medication (s) are on the active medication list: yes  Last refill:  12/24/22 #30/1  Future visit scheduled: yes  Notes to clinic:  pt has FU TSH lab on 03/12/23 ok to refill?      Requested Prescriptions  Pending Prescriptions Disp Refills   SYNTHROID 50 MCG tablet [Pharmacy Med Name: SYNTHROID 50 MCG TAB] 30 tablet 1    Sig: TAKE 1 TABLET BY MOUTH ONCE DAILY ON AN EMPTY STOMACH. WAIT 30 MINUTES BEFORE TAKING OTHER MEDS.     Endocrinology:  Hypothyroid Agents Failed - 02/26/2023  1:09 PM      Failed - TSH in normal range and within 360 days    TSH  Date Value Ref Range Status  12/19/2022 0.096 (L) 0.450 - 4.500 uIU/mL Final         Passed - Valid encounter within last 12 months    Recent Outpatient Visits           2 months ago Routine general medical examination at a health care facility   [facility] [place] P, DO   4 months ago Type 2 diabetes mellitus with other ophthalmic complication, without long-term current use of insulin (HCC)   [place] [facility] [place], Megan P, DO   5 months ago Chronic bilateral low back pain without sciatica   [place] [place], Megan P, DO   6 months ago Chronic bilateral low back pain without sciatica   [place] [facility] [place], Megan P, DO   6 months ago Sore throat   [place] [facility] [place], [place], DO       Future Appointments             In 1 month Arida, Chelsea Aus, MD [facility]

## 2023-03-12 ENCOUNTER — Other Ambulatory Visit: Payer: 59

## 2023-03-12 ENCOUNTER — Telehealth: Payer: Self-pay | Admitting: Family Medicine

## 2023-03-12 ENCOUNTER — Other Ambulatory Visit: Payer: Self-pay | Admitting: Family Medicine

## 2023-03-12 DIAGNOSIS — E039 Hypothyroidism, unspecified: Secondary | ICD-10-CM | POA: Diagnosis not present

## 2023-03-12 NOTE — Telephone Encounter (Signed)
Prescription Request  03/12/2023  LOV: 12/19/2022  What is the name of the medication or equipment?  levothyroxine (SYNTHROID) 50 MCG tablet   Semaglutide, 1 MG/DOSE, (OZEMPIC, 1 MG/DOSE,) 4 MG/3ML SOPN  Have you contacted your pharmacy to request a refill? No   Which pharmacy would you like this sent to?  [facility] [place], [place] - [address] [place] [address] Phone: [phone] Fax: [phone]    Patient notified that their request is being sent to the clinical staff for review and that they should receive a response within 2 business days.   Please advise at Mobile [phone]

## 2023-03-12 NOTE — Telephone Encounter (Signed)
She had thyroid levels drawn today. Will need to get results before her refill can be sent. 6 months of ozempic called in in October. Not due for refill at this time.

## 2023-03-12 NOTE — Telephone Encounter (Signed)
Patient was notified at appointment. Patient verbalized understanding.

## 2023-03-13 ENCOUNTER — Other Ambulatory Visit: Payer: Self-pay | Admitting: Family Medicine

## 2023-03-13 LAB — TSH: TSH: 3.86 u[IU]/mL (ref 0.450–4.500)

## 2023-03-13 MED ORDER — LEVOTHYROXINE SODIUM 50 MCG PO TABS: 50.0000 ug | ORAL_TABLET | Freq: Every day | ORAL | 3 refills | Status: DC

## 2023-03-16 NOTE — Telephone Encounter (Signed)
 Requested medications are due for refill today.  yes  Requested medications are on the active medications list.  yes  Last refill. 12/19/2022 #40 3 rf  Future visit scheduled.   no  Notes to clinic.  Refill/refusal not delegated.    Requested Prescriptions  Pending Prescriptions Disp Refills   ondansetron  (ZOFRAN ) 4 MG tablet [Pharmacy Med Name: ONDANSETRON  HCL 4 MG TAB] 40 tablet 3    Sig: TAKE 1 TABLET BY MOUTH EVERY 8 HOURS AS NEEDED FOR NAUSEA OR VOMITING     Not Delegated - Gastroenterology: Antiemetics - ondansetron  Failed - 03/16/2023  1:12 PM      Failed - This refill cannot be delegated      Passed - AST in normal range and within 360 days    AST  Date Value Ref Range Status  12/19/2022 27 0 - 40 IU/L Final         Passed - ALT in normal range and within 360 days    ALT  Date Value Ref Range Status  12/19/2022 12 0 - 32 IU/L Final         Passed - Valid encounter within last 6 months    Recent Outpatient Visits           2 months ago Routine general medical examination at a health care facility   [facility] [place] P, DO   4 months ago Type 2 diabetes mellitus with other ophthalmic complication, without long-term current use of insulin (HCC)   [place] [facility] [place], Megan P, DO   6 months ago Chronic bilateral low back pain without sciatica   [place] [facility] [place], Megan P, DO   7 months ago Chronic bilateral low back pain without sciatica   [place] [facility] [place], Megan P, DO   7 months ago Sore throat   [place] [facility] [place], Duwaine S[place], DO       Future Appointments             In 1 week Darron, Deatrice LABOR, MD [facility]

## 2023-03-19 ENCOUNTER — Encounter: Payer: Self-pay | Admitting: Family Medicine

## 2023-03-19 ENCOUNTER — Ambulatory Visit (INDEPENDENT_AMBULATORY_CARE_PROVIDER_SITE_OTHER): Payer: 59 | Admitting: Family Medicine

## 2023-03-19 VITALS — BP 94/63 | HR 84 | Wt 163.2 lb

## 2023-03-19 DIAGNOSIS — M545 Low back pain, unspecified: Secondary | ICD-10-CM

## 2023-03-19 DIAGNOSIS — M9902 Segmental and somatic dysfunction of thoracic region: Secondary | ICD-10-CM | POA: Diagnosis not present

## 2023-03-19 DIAGNOSIS — M9903 Segmental and somatic dysfunction of lumbar region: Secondary | ICD-10-CM

## 2023-03-19 DIAGNOSIS — M9909 Segmental and somatic dysfunction of abdomen and other regions: Secondary | ICD-10-CM | POA: Diagnosis not present

## 2023-03-19 DIAGNOSIS — G8929 Other chronic pain: Secondary | ICD-10-CM | POA: Diagnosis not present

## 2023-03-19 DIAGNOSIS — M9905 Segmental and somatic dysfunction of pelvic region: Secondary | ICD-10-CM

## 2023-03-19 DIAGNOSIS — M9904 Segmental and somatic dysfunction of sacral region: Secondary | ICD-10-CM | POA: Diagnosis not present

## 2023-03-19 DIAGNOSIS — M9901 Segmental and somatic dysfunction of cervical region: Secondary | ICD-10-CM

## 2023-03-19 DIAGNOSIS — M9908 Segmental and somatic dysfunction of rib cage: Secondary | ICD-10-CM

## 2023-03-19 DIAGNOSIS — M9900 Segmental and somatic dysfunction of head region: Secondary | ICD-10-CM

## 2023-03-19 NOTE — Progress Notes (Signed)
 BP 94/63   Pulse 84   Wt 163 lb 3.2 oz (74 kg)   SpO2 97%   BMI 30.84 kg/m    Subjective:    Patient ID: Sharon Rodriguez, female    DOB: 16-Aug-1980, 43 y.o.   MRN: [id number]  HPI: Sharon Rodriguez is a 43 y.o. female  Chief Complaint  Patient presents with   Back Pain   Conna presents today for evaluation and possible treatment with OMT for her low back pain. She notes that she has been doing OK since her last visit. She has been having pain in her upper back, lower back and L knee. She notes that her pain is better with OMT and certain positions. Her pain is worse with overexertion. She notes that she traveled over the holidays and was sitting in a car for several hours. She notes that this makes it worse. Pain does not radiate. She notes that she is otherwise doing well with no other concerns or complaints at this time.   Relevant past medical, surgical, family and social history reviewed and updated as indicated. Interim medical history since our last visit reviewed. Allergies and medications reviewed and updated.  Review of Systems  Constitutional: Negative.   Respiratory: Negative.    Cardiovascular: Negative.   Musculoskeletal:  Positive for arthralgias, back pain, myalgias, neck pain and neck stiffness. Negative for gait problem and joint swelling.  Neurological: Negative.   Psychiatric/Behavioral: Negative.      Per HPI unless specifically indicated above     Objective:    BP 94/63   Pulse 84   Wt 163 lb 3.2 oz (74 kg)   SpO2 97%   BMI 30.84 kg/m   Wt Readings from Last 3 Encounters:  03/19/23 163 lb 3.2 oz (74 kg)  12/26/22 159 lb (72.1 kg)  12/19/22 159 lb (72.1 kg)    Physical Exam Vitals and nursing note reviewed.  Constitutional:      General: She is not in acute distress.    Appearance: Normal appearance. She is not ill-appearing.  HENT:     Head: Normocephalic and atraumatic.     Right Ear: External ear normal.     Left Ear: External ear  normal.     Nose: Nose normal.     Mouth/Throat:     Mouth: Mucous membranes are moist.     Pharynx: Oropharynx is clear.  Eyes:     Extraocular Movements: Extraocular movements intact.     Conjunctiva/sclera: Conjunctivae normal.     Pupils: Pupils are equal, round, and reactive to light.  Neck:     Vascular: No carotid bruit.  Cardiovascular:     Rate and Rhythm: Normal rate.     Pulses: Normal pulses.  Pulmonary:     Effort: Pulmonary effort is normal. No respiratory distress.  Abdominal:     General: Abdomen is flat. There is no distension.     Palpations: Abdomen is soft. There is no mass.     Tenderness: There is no abdominal tenderness. There is no right CVA tenderness, left CVA tenderness, guarding or rebound.     Hernia: No hernia is present.  Musculoskeletal:     Cervical back: No muscular tenderness.  Lymphadenopathy:     Cervical: No cervical adenopathy.  Skin:    General: Skin is warm and dry.     Capillary Refill: Capillary refill takes less than 2 seconds.     Coloration: Skin is not jaundiced or pale.  Findings: No bruising, erythema, lesion or rash.  Neurological:     General: No focal deficit present.     Mental Status: She is alert. Mental status is at baseline.  Psychiatric:        Mood and Affect: Mood normal.        Behavior: Behavior normal.        Thought Content: Thought content normal.        Judgment: Judgment normal.    Musculoskeletal:  Exam found Decreased ROM, Tissue texture changes, Tenderness to palpation, and Asymmetry of patient's  head, neck, thorax, ribs, lumbar, pelvis, sacrum, and abdomen Osteopathic Structural Exam:   Head: OAESSR, Hypertonic suboccipital muscles   Neck: C4ESRR  Thorax: T10Bilaterally extended, T3-5SRRL  Ribs: Ribs 4-7 locked up on the L  Lumbar: QL hypertonic on the L, psoas hypertonic on the L, L3-5SRRL  Pelvis: Posterior L innominate  Sacrum: R on  R torsion  Abdomen: diaphragm hypertonic bilaterally  L>R  Results for orders placed or performed in visit on 03/12/23  TSH   Collection Time: 03/12/23  1:20 PM  Result Value Ref Range   TSH 3.860 0.450 - 4.500 uIU/mL      Assessment & Plan:   Problem List Items Addressed This Visit       Other   Chronic bilateral low back pain without sciatica - Primary (Chronic)   Myofascial in nature. She does have somatic dysfunction that is contributing to her symptoms. Treated today with good results. Treated today with good results as below. Call with any concerns.       Other Visit Diagnoses       Somatic dysfunction of lumbar region         Thoracic segment dysfunction         Segmental dysfunction of rib cage         Segmental dysfunction of abdomen         Somatic dysfunction of sacral region         Somatic dysfunction of pelvis region         Cervical segment dysfunction         Head region somatic dysfunction          After verbal consent was obtained, patient was treated today with osteopathic manipulative medicine to the regions of the head, neck, thorax, ribs, lumbar, pelvis, sacrum, and abdomen using the techniques of cranial, myofascial release, counterstrain, muscle energy, HVLA, and soft tissue. Areas of compensation relating to her primary pain source also treated. Patient tolerated the procedure well with good objective and good subjective improvement in symptoms. She left the room in good condition. She was advised to stay well hydrated and that she may have some soreness following the procedure. If not improving or worsening, she will call and come in. She will return for reevaluation  on a PRN basis.   Follow up plan: Return ASAP follow up.

## 2023-03-20 ENCOUNTER — Encounter: Payer: Self-pay | Admitting: Family Medicine

## 2023-03-20 NOTE — Assessment & Plan Note (Signed)
 Myofascial in nature. She does have somatic dysfunction that is contributing to her symptoms. Treated today with good results. Treated today with good results as below. Call with any concerns.

## 2023-03-27 ENCOUNTER — Encounter: Payer: Self-pay | Admitting: Student in an Organized Health Care Education/Training Program

## 2023-03-27 ENCOUNTER — Ambulatory Visit
Payer: 59 | Attending: Student in an Organized Health Care Education/Training Program | Admitting: Student in an Organized Health Care Education/Training Program

## 2023-03-27 VITALS — BP 98/60 | Temp 98.2°F | Resp 16 | Ht 61.0 in | Wt 167.0 lb

## 2023-03-27 DIAGNOSIS — M26629 Arthralgia of temporomandibular joint, unspecified side: Secondary | ICD-10-CM | POA: Diagnosis not present

## 2023-03-27 DIAGNOSIS — G894 Chronic pain syndrome: Secondary | ICD-10-CM | POA: Diagnosis not present

## 2023-03-27 DIAGNOSIS — M47816 Spondylosis without myelopathy or radiculopathy, lumbar region: Secondary | ICD-10-CM | POA: Diagnosis not present

## 2023-03-27 DIAGNOSIS — M797 Fibromyalgia: Secondary | ICD-10-CM | POA: Diagnosis not present

## 2023-03-27 DIAGNOSIS — Z79891 Long term (current) use of opiate analgesic: Secondary | ICD-10-CM | POA: Diagnosis not present

## 2023-03-27 DIAGNOSIS — R5382 Chronic fatigue, unspecified: Secondary | ICD-10-CM

## 2023-03-27 MED ORDER — OXYCODONE-ACETAMINOPHEN 5-325 MG PO TABS: 1.0000 | ORAL_TABLET | Freq: Four times a day (QID) | ORAL | 0 refills | Status: DC | PRN

## 2023-03-27 MED ORDER — OXYCODONE-ACETAMINOPHEN 5-325 MG PO TABS: 1.0000 | ORAL_TABLET | Freq: Four times a day (QID) | ORAL | 0 refills | Status: AC | PRN

## 2023-03-27 NOTE — Progress Notes (Signed)
 Nursing Pain Medication Assessment:  Safety precautions to be maintained throughout the outpatient stay will include: orient to surroundings, keep bed in low position, maintain call bell within reach at all times, provide assistance with transfer out of bed and ambulation.  Medication Inspection Compliance: Pill count conducted under aseptic conditions, in front of the patient. Neither the pills nor the bottle was removed from the patient's sight at any time. Once count was completed pills were immediately returned to the patient in their original bottle.  Medication: Oxycodone /APAP Pill/Patch Count:  13 of 120 pills remain Pill/Patch Appearance: Markings consistent with prescribed medication Bottle Appearance: Standard pharmacy container. Clearly labeled. Filled Date: 44 / 17 / 2024 Last Medication intake:  Today

## 2023-03-27 NOTE — Progress Notes (Signed)
 PROVIDER NOTE: Information contained herein reflects review and annotations entered in association with encounter. Interpretation of such information and data should be left to medically-trained personnel. Information provided to patient can be located elsewhere in the medical record under Patient Instructions. Document created using STT-dictation technology, any transcriptional errors that may result from process are unintentional.    Patient: Sharon Rodriguez  Service Category: E/M  Provider: Wallie Sherry, MD  DOB: 12-23-1980  DOS: 03/27/2023  Specialty: Interventional Pain Management  MRN: [id number]  Setting: Ambulatory outpatient  PCP: Vicci Duwaine SQUIBB, DO  Type: Established Patient    Referring Provider: Vicci Duwaine SQUIBB, DO  Location: Office  Delivery: Face-to-face     HPI  Ms. AZANA KIESLER, a 43 y.o. 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 Ms. Vaux primary complain today is Back Pain (Lumbar bilateral right is worse )  Last encounter: My last encounter with her was on 12/26/22  Pertinent problems: Ms. Collings has Hypothyroidism; Rheumatoid arthritis (HCC); Depression; Fibromyalgia; TMJ pain dysfunction syndrome; H/O juvenile rheumatoid arthritis; Spondylosis without myelopathy or radiculopathy, lumbar region; Chronic bilateral low back pain without sciatica; Chronic pain syndrome; and Long term current use of opiate analgesic on their pertinent problem list. Pain Assessment: Severity of Chronic pain is reported as a 7 /10. Location: Back Lower, Left, Right/into pelvis causing it to be difficult to walk. Onset: More than a month ago. Quality: Discomfort, Constant, Throbbing, Aching, Pins and needles. Timing: Constant. Modifying factor(s): heat/ice, medications, rest, stretches. Vitals:  height is 5' 1 (1.549 m) and weight is 167 lb (75.8 kg). Her temporal temperature is 98.2 F (36.8 C). Her blood pressure is  98/60. Her respiration is 16.   Reason for encounter: medication management.     lower back pain bilateral, right is worse and goes into pelvis. physical activity flares the pain. she is asking about a TENS unit that was initiated with CJ and zynex- we will resend   Pharmacotherapy Assessment  Analgesic: Percocet 5 mg 4 times daily as needed, quantity 120/month; MME equals 30    Monitoring: [place] PMP: PDMP reviewed during this encounter.       Pharmacotherapy: No side-effects or adverse reactions reported. Compliance: No problems identified. Effectiveness: Clinically acceptable.  UDS:  Summary  Date Value Ref Range Status  06/27/2022 Note  Final    Comment:    ==================================================================== [employer] Select 13 (MW) ==================================================================== Test                             Result       Flag       Units  Drug Present and Declared for Prescription Verification   Oxycodone                       1308         EXPECTED   ng/mg creat   Oxymorphone                    207          EXPECTED   ng/mg creat   Noroxycodone                   1342         EXPECTED   ng/mg creat   Noroxymorphone  128          EXPECTED   ng/mg creat    Sources of oxycodone  are scheduled prescription medications.    Oxymorphone, noroxycodone, and noroxymorphone are expected    metabolites of oxycodone . Oxymorphone is also available as a    scheduled prescription medication.  Drug Absent but Declared for Prescription Verification   Amphetamine                    Not Detected UNEXPECTED ng/mg creat ==================================================================== Test                      Result    Flag   Units      Ref Range   Creatinine              221              mg/dL      >=79 ==================================================================== Declared Medications:  The flagging and interpretation on this  report are based on the  following declared medications.  Unexpected results may arise from  inaccuracies in the declared medications.   **Note: The testing scope of this panel includes these medications:   Amphetamine (Adderall)  Oxycodone  (Percocet)   **Note: The testing scope of this panel does not include the  following reported medications:   Acetaminophen  (Percocet)  Albuterol  (Ventolin  HFA)  Epinephrine  (EpiPen )  Ondansetron  (Zofran )  Semaglutide  (Ozempic )  Thyroid : Liothyronine/Levothyroxine  ([place]) ==================================================================== For clinical consultation, please call [phone]. ====================================================================       ROS  Constitutional: Denies any fever or chills Gastrointestinal: No reported hemesis, hematochezia, vomiting, or acute GI distress Musculoskeletal:  diffuse MSK pain, low back pain Neurological: No reported episodes of acute onset apraxia, aphasia, dysarthria, agnosia, amnesia, paralysis, loss of coordination, or loss of consciousness  Medication Review  EPINEPHrine , FreeStyle Libre 3 Sensor, ONE TOUCH ULTRA 2, Pen Needles, Semaglutide  (1 MG/DOSE), Vitamin D  (Ergocalciferol ), albuterol , amphetamine-dextroamphetamine, glucose blood, levothyroxine , ondansetron , onetouch ultrasoft, and oxyCODONE -acetaminophen   History Review  Allergy: Ms. Brave is allergic to ibuprofen, metformin  and related, and promethazine. Drug: Ms. Siebels  reports no history of drug use. Alcohol:  reports no history of alcohol use. Tobacco:  reports that she has never smoked. She has never used smokeless tobacco. Social: Ms. Kanaan  reports that she has never smoked. She has never used smokeless tobacco. She reports that she does not drink alcohol and does not use drugs. Medical:  has a past medical history of ADD (attention deficit disorder), Anxiety, Asthma, Biliary dyskinesia (12/27/2017), Chronic  bronchitis (HCC), Chronic fatigue, Chronic pain, Depression, Diabetes mellitus without complication (HCC), Fibromyalgia, GERD (gastroesophageal reflux disease), History of stomach ulcers (2019), Hypothyroidism (01/27/2015), Learning disability, Osteoarthritis, Rheumatoid arthritis (HCC), Thyroid  disease, and TMJ (dislocation of temporomandibular joint). Surgical: Ms. Wisby  has a past surgical history that includes Cesarean section; Esophagogastroduodenoscopy (egd) with propofol  (N/A, 12/07/2017); and Cholecystectomy (N/A, 01/14/2018). Family: family history includes ADD / ADHD in her daughter and mother; Allergies in her daughter and daughter; Arthritis in her brother and paternal grandmother; COPD in her mother; Cancer in her maternal grandmother; Dementia in her paternal grandmother; Diabetes in her father and mother; Heart attack in her maternal grandfather; Heart disease in her maternal grandfather; Hyperlipidemia in her father; Learning disabilities in her mother; Mental illness in her daughter, father, and sister; Stroke in her maternal grandfather; Thyroid  disease in her daughter.  Laboratory Chemistry Profile   Renal Lab Results  Component Value Date   BUN  10 12/19/2022   CREATININE 0.76 12/19/2022   BCR 13 12/19/2022   GFRAA 94 04/16/2020   GFRNONAA 82 04/16/2020     Hepatic Lab Results  Component Value Date   AST 27 12/19/2022   ALT 12 12/19/2022   ALBUMIN 4.0 12/19/2022   ALKPHOS 71 12/19/2022   LIPASE 43 04/10/2017     Electrolytes Lab Results  Component Value Date   NA 138 12/19/2022   K 4.3 12/19/2022   CL 103 12/19/2022   CALCIUM 8.7 12/19/2022   MG 2.2 07/29/2020     Bone Lab Results  Component Value Date   VD25OH 33.5 12/19/2022     Inflammation (CRP: Acute Phase) (ESR: Chronic Phase) No results found for: CRP, ESRSEDRATE, LATICACIDVEN     Note: Above Lab results reviewed.  Recent Imaging Review  LONG TERM MONITOR (3-14 DAYS) HR 62 - 167, average  90 bpm. Rare supraventricular and ventricular ectopy. No atrial fibrillation. No sustained arrhythmias.  Ole T. Cindie, MD, [facility], [facility] Cardiac Electrophysiology  Note: Reviewed        Physical Exam  General appearance: Well nourished, well developed, and well hydrated. In no apparent acute distress Mental status: Alert, oriented x 3 (person, place, & time)       Respiratory: No evidence of acute respiratory distress Eyes: PERLA Vitals: BP 98/60 (BP Location: Right Arm, Patient Position: Sitting, Cuff Size: Large)   Temp 98.2 F (36.8 C) (Temporal)   Resp 16   Ht 5' 1 (1.549 m)   Wt 167 lb (75.8 kg)   BMI 31.55 kg/m  BMI: Estimated body mass index is 31.55 kg/m as calculated from the following:   Height as of this encounter: 5' 1 (1.549 m).   Weight as of this encounter: 167 lb (75.8 kg). Ideal: Ideal body weight: 47.8 kg (105 lb 6.1 oz) Adjusted ideal body weight: 59 kg (130 lb 0.5 oz)   Lumbar Spine Area Exam  Skin & Axial Inspection: No masses, redness, or swelling Alignment: Symmetrical Functional ROM: Decreased ROM       Stability: No instability detected Muscle Tone/Strength: Functionally intact. No obvious neuro-muscular anomalies detected.                 Gait & Posture Assessment  Ambulation: Unassisted Gait: Relatively normal for age and body habitus Posture: WNL    Lower Extremity Exam      Side: Right lower extremity   Side: Left lower extremity  Stability: No instability observed           Stability: No instability observed          Skin & Extremity Inspection: Skin color, temperature, and hair growth are WNL. No peripheral edema or cyanosis. No masses, redness, swelling, asymmetry, or associated skin lesions. No contractures.   Skin & Extremity Inspection: Skin color, temperature, and hair growth are WNL. No peripheral edema or cyanosis. No masses, redness, swelling, asymmetry, or associated skin lesions. No contractures.  Functional ROM:  Unrestricted ROM                   Functional ROM: Unrestricted ROM                  Muscle Tone/Strength: Functionally intact. No obvious neuro-muscular anomalies detected.   Muscle Tone/Strength: Functionally intact. No obvious neuro-muscular anomalies detected.  Sensory (Neurological): Unimpaired   Sensory (Neurological): Unimpaired  Palpation: No palpable anomalies   Palpation: No palpable anomalies      Assessment  Status Diagnosis  Controlled Controlled Controlled 1. Spondylosis without myelopathy or radiculopathy, lumbar region   2. Chronic fatigue   3. Chronic pain syndrome   4. Fibromyalgia   5. TMJ pain dysfunction syndrome   6. Long term current use of opiate analgesic           Plan of Care  Ms. RAIYA STAINBACK has a current medication list which includes the following long-term medication(s): albuterol  and levothyroxine .  Pharmacotherapy (Medications Ordered): Meds ordered this encounter  Medications   oxyCODONE -acetaminophen  (PERCOCET) 5-325 MG tablet    Sig: Take 1 tablet by mouth every 6 (six) hours as needed for severe pain (pain score 7-10). Must last 30 days.    Dispense:  120 tablet    Refill:  0    [place] STOP ACT - Not applicable. Fill one day early if pharmacy is closed on scheduled refill date.   oxyCODONE -acetaminophen  (PERCOCET) 5-325 MG tablet    Sig: Take 1 tablet by mouth every 6 (six) hours as needed for severe pain (pain score 7-10). Must last 30 days.    Dispense:  120 tablet    Refill:  0    [place] STOP ACT - Not applicable. Fill one day early if pharmacy is closed on scheduled refill date.   oxyCODONE -acetaminophen  (PERCOCET) 5-325 MG tablet    Sig: Take 1 tablet by mouth every 6 (six) hours as needed for severe pain (pain score 7-10). Must last 30 days.    Dispense:  120 tablet    Refill:  0    [place] STOP ACT - Not applicable. Fill one day early if pharmacy is closed on scheduled refill date.  Rx for TENS and hot/cold therapy  No orders of  the defined types were placed in this encounter.  UDS up-to-date and appropriate.  Follow-up plan:   No follow-ups on file.   Recent Visits No visits were found meeting these conditions. Showing recent visits within past 90 days and meeting all other requirements Today's Visits Date Type Provider Dept  03/27/23 Office Visit Marcelino Nurse, MD [facility]  Showing today's visits and meeting all other requirements Future Appointments No visits were found meeting these conditions. Showing future appointments within next 90 days and meeting all other requirements  I discussed the assessment and treatment plan with the patient. The patient was provided an opportunity to ask questions and all were answered. The patient agreed with the plan and demonstrated an understanding of the instructions.  Patient advised to call back or seek an in-person evaluation if the symptoms or condition worsens.  Duration of encounter: 30 minutes.  Note by: Nurse Marcelino, MD Date: 03/27/2023; Time: 10:36 AM

## 2023-03-29 ENCOUNTER — Encounter: Payer: Self-pay | Admitting: Cardiovascular Disease

## 2023-03-29 ENCOUNTER — Ambulatory Visit: Payer: 59 | Attending: Cardiovascular Disease | Admitting: Cardiovascular Disease

## 2023-03-29 VITALS — BP 98/60 | HR 95 | Ht 61.0 in | Wt 163.4 lb

## 2023-03-29 DIAGNOSIS — M47816 Spondylosis without myelopathy or radiculopathy, lumbar region: Secondary | ICD-10-CM | POA: Diagnosis not present

## 2023-03-29 DIAGNOSIS — R5382 Chronic fatigue, unspecified: Secondary | ICD-10-CM | POA: Diagnosis not present

## 2023-03-29 DIAGNOSIS — R002 Palpitations: Secondary | ICD-10-CM

## 2023-03-29 DIAGNOSIS — G894 Chronic pain syndrome: Secondary | ICD-10-CM | POA: Diagnosis not present

## 2023-03-29 NOTE — Patient Instructions (Signed)
Medication Instructions:  No changes *If you need a refill on your cardiac medications before your next appointment, please call your pharmacy*   Lab Work: None ordered If you have labs (blood work) drawn today and your tests are completely normal, you will receive your results only by: MyChart Message (if you have MyChart) OR A paper copy in the mail If you have any lab test that is abnormal or we need to change your treatment, we will call you to review the results.   Testing/Procedures: None ordered   Follow-Up: At [facility], you and your health needs are our priority.  As part of our continuing mission to provide you with exceptional heart care, we have created designated Provider Care Teams.  These Care Teams include your primary Cardiologist (physician) and Advanced Practice Providers (APPs -  Physician Assistants and Nurse Practitioners) who all work together to provide you with the care you need, when you need it.  We recommend signing up for the patient portal called "MyChart".  Sign up information is provided on this After Visit Summary.  MyChart is used to connect with patients for Virtual Visits (Telemedicine).  Patients are able to view lab/test results, encounter notes, upcoming appointments, etc.  Non-urgent messages can be sent to your provider as well.   To learn more about what you can do with MyChart, go to [email or web address].    Your next appointment:   Follow up needed

## 2023-03-29 NOTE — Progress Notes (Signed)
Cardiology Office Note   Date:  03/30/2023   ID:  Sharon Rodriguez, DOB 12-02-1980, MRN [id number]  PCP:  Dorcas Carrow, DO  Cardiologist:   Lorine Bears, MD   Chief Complaint  Patient presents with   Follow-up    Palpitations. Meds reviewed verbally with pt.      History of Present Illness: Sharon Rodriguez is a 43 y.o. female who was referred by Dr. Laural Benes for evaluation of palpitations. She has known history of anxiety/depression and type 2 diabetes.  She reports prolonged symptoms of intermittent skipping in her heart with no dizziness, syncope or presyncope.  This was most notable to her when her thyroid was not well-regulated.  However, her symptoms improved recently and most recent TSH was in the normal range.  She denies any chest pain or shortness of breath. She had a 2-week ZIO monitor done in May 2024 which showed no significant arrhythmia.  She had multiple triggered events that did not correlate with arrhythmia.  She is not a smoker and does not drink alcohol.  There is no family history of coronary artery disease or arrhythmia.   Past Medical History:  Diagnosis Date   ADD (attention deficit disorder)    Anxiety    Asthma    WELL CONTROLLED   Biliary dyskinesia 12/27/2017   Chronic bronchitis (HCC)    Chronic fatigue    Chronic pain    Depression    Diabetes mellitus without complication (HCC)    Fibromyalgia    GERD (gastroesophageal reflux disease)    History of stomach ulcers 2019   Hypothyroidism 01/27/2015   Learning disability    Osteoarthritis    Rheumatoid arthritis (HCC)    Thyroid disease    TMJ (dislocation of temporomandibular joint)     Past Surgical History:  Procedure Laterality Date   CESAREAN SECTION     x 2   CHOLECYSTECTOMY N/A 01/14/2018   Procedure: LAPAROSCOPIC CHOLECYSTECTOMY;  Surgeon: Ancil Linsey, MD;  Location: [facility] ORS;  Service: General;  Laterality: N/A;   ESOPHAGOGASTRODUODENOSCOPY (EGD) WITH PROPOFOL  N/A 12/07/2017   Procedure: ESOPHAGOGASTRODUODENOSCOPY (EGD) WITH PROPOFOL;  Surgeon: Wyline Mood, MD;  Location: [facility] ENDOSCOPY;  Service: Gastroenterology;  Laterality: N/A;     Current Outpatient Medications  Medication Sig Dispense Refill   albuterol (VENTOLIN HFA) 108 (90 Base) MCG/ACT inhaler Inhale 1 puff into the lungs every 6 (six) hours as needed for wheezing or shortness of breath. 18 g 6   amphetamine-dextroamphetamine (ADDERALL) 30 MG tablet Take 1 tablet by mouth 2 (two) times daily.     Blood Glucose Monitoring Suppl (ONE TOUCH ULTRA 2) w/Device KIT 1 each by Does not apply route daily. 1 kit 0   Continuous Glucose Sensor (FREESTYLE LIBRE 3 SENSOR) MISC 1 each by Does not apply route every 14 (fourteen) days. Place 1 sensor on the skin every 14 days. Use to check glucose continuously 2 each 12   EPINEPHrine (EPIPEN 2-PAK) 0.3 mg/0.3 mL IJ SOAJ injection Inject 0.3 mg into the muscle as needed for anaphylaxis (Pateint is a Systems developer.). 1 each 12   glucose blood (ONETOUCH ULTRA) test strip 1 each by Other route daily. Use as instructed 100 each 3   Insulin Pen Needle (PEN NEEDLES) 32G X 5 MM MISC 1 each by Does not apply route once a week. 100 each 3   Lancets (ONETOUCH ULTRASOFT) lancets 1 each by Other route daily. Use as instructed 100 each 3  levothyroxine (SYNTHROID) 50 MCG tablet Take 1 tablet (50 mcg total) by mouth daily before breakfast. 90 tablet 3   ondansetron (ZOFRAN) 4 MG tablet TAKE 1 TABLET BY MOUTH EVERY 8 HOURS AS NEEDED FOR NAUSEA OR VOMITING 40 tablet 0   oxyCODONE-acetaminophen (PERCOCET) 5-325 MG tablet Take 1 tablet by mouth every 6 (six) hours as needed for severe pain (pain score 7-10). Must last 30 days. 120 tablet 0   [START ON 04/28/2023] oxyCODONE-acetaminophen (PERCOCET) 5-325 MG tablet Take 1 tablet by mouth every 6 (six) hours as needed for severe pain (pain score 7-10). Must last 30 days. 120 tablet 0   [START ON 05/28/2023] oxyCODONE-acetaminophen  (PERCOCET) 5-325 MG tablet Take 1 tablet by mouth every 6 (six) hours as needed for severe pain (pain score 7-10). Must last 30 days. 120 tablet 0   Semaglutide, 1 MG/DOSE, (OZEMPIC, 1 MG/DOSE,) 4 MG/3ML SOPN INJECT 0.375MLS (0.5MG  TOTAL) SUBCUTANEOUSLY ONCE A WEEK 9 mL 1   Vitamin D, Ergocalciferol, (DRISDOL) 1.25 MG (50000 UNIT) CAPS capsule Take 1 capsule (50,000 Units total) by mouth every 7 (seven) days. 12 capsule 1   No current facility-administered medications for this visit.    Allergies:   Ibuprofen, Metformin and related, and Promethazine    Social History:  The patient  reports that she has never smoked. She has never used smokeless tobacco. She reports that she does not drink alcohol and does not use drugs.   Family History:  The patient's family history includes ADD / ADHD in her daughter and mother; Allergies in her daughter and daughter; Arthritis in her brother and paternal grandmother; COPD in her mother; Cancer in her maternal grandmother; Dementia in her paternal grandmother; Diabetes in her father and mother; Heart attack in her maternal grandfather; Heart disease in her maternal grandfather; Hyperlipidemia in her father; Learning disabilities in her mother; Mental illness in her daughter, father, and sister; Stroke in her maternal grandfather; Thyroid disease in her daughter.    ROS:  Please see the history of present illness.   Otherwise, review of systems are positive for none.   All other systems are reviewed and negative.    PHYSICAL EXAM: VS:  BP 98/60 (BP Location: Right Arm, Cuff Size: Normal)   Pulse 95   Ht 5\' 1"  (1.549 m)   Wt 163 lb 6 oz (74.1 kg)   SpO2 98%   BMI 30.87 kg/m  , BMI Body mass index is 30.87 kg/m. GEN: Well nourished, well developed, in no acute distress  HEENT: normal  Neck: no JVD, carotid bruits, or masses Cardiac: RRR; no murmurs, rubs, or gallops,no edema  Respiratory:  clear to auscultation bilaterally, normal work of breathing GI:  soft, nontender, nondistended, + BS MS: no deformity or atrophy  Skin: warm and dry, no rash Neuro:  Strength and sensation are intact Psych: euthymic mood, full affect   EKG:  EKG is ordered today. The ekg ordered today demonstrates : Normal sinus rhythm Low voltage QRS       Recent Labs: 12/19/2022: ALT 12; BUN 10; Creatinine, Ser 0.76; Hemoglobin 13.8; Platelets 249; Potassium 4.3; Sodium 138 03/12/2023: TSH 3.860    Lipid Panel    Component Value Date/Time   CHOL 138 12/19/2022 1324   TRIG 197 (H) 12/19/2022 1324   HDL 37 (L) 12/19/2022 1324   LDLCALC 68 12/19/2022 1324      Wt Readings from Last 3 Encounters:  03/29/23 163 lb 6 oz (74.1 kg)  03/27/23 167 lb (75.8  kg)  03/19/23 163 lb 3.2 oz (74 kg)          03/29/2023    2:43 PM  PAD Screen  Previous PAD dx? No  Previous surgical procedure? Yes  Dates of procedures Hx 2 x C-section, gallbladder  Pain with walking? No  Feet/toe relief with dangling? No  Painful, non-healing ulcers? No  Extremities discolored? No      ASSESSMENT AND PLAN:  1.  Palpitations: Suspect this is due to premature beats with low burden overall.  No evidence of significant arrhythmia and she has no symptoms to suggest prolonged tachycardia.  Her EKG is normal and cardiac exam is unremarkable.  Monitor last year did not show significant arrhythmia and multiple triggered events also did not correlate with any arrhythmia.  Her symptoms improved after regulating her thyroid function.  No further cardiac workup is recommended at this time.    Disposition:   FU as needed  Signed,  Lorine Bears, MD  03/30/2023 9:08 AM    [facility]

## 2023-03-30 DIAGNOSIS — G894 Chronic pain syndrome: Secondary | ICD-10-CM | POA: Diagnosis not present

## 2023-03-30 DIAGNOSIS — R5382 Chronic fatigue, unspecified: Secondary | ICD-10-CM | POA: Diagnosis not present

## 2023-03-30 DIAGNOSIS — M47816 Spondylosis without myelopathy or radiculopathy, lumbar region: Secondary | ICD-10-CM | POA: Diagnosis not present

## 2023-03-31 DIAGNOSIS — R5382 Chronic fatigue, unspecified: Secondary | ICD-10-CM | POA: Diagnosis not present

## 2023-03-31 DIAGNOSIS — M47816 Spondylosis without myelopathy or radiculopathy, lumbar region: Secondary | ICD-10-CM | POA: Diagnosis not present

## 2023-03-31 DIAGNOSIS — G894 Chronic pain syndrome: Secondary | ICD-10-CM | POA: Diagnosis not present

## 2023-04-01 DIAGNOSIS — G894 Chronic pain syndrome: Secondary | ICD-10-CM | POA: Diagnosis not present

## 2023-04-01 DIAGNOSIS — M47816 Spondylosis without myelopathy or radiculopathy, lumbar region: Secondary | ICD-10-CM | POA: Diagnosis not present

## 2023-04-01 DIAGNOSIS — R5382 Chronic fatigue, unspecified: Secondary | ICD-10-CM | POA: Diagnosis not present

## 2023-04-02 ENCOUNTER — Encounter: Payer: Self-pay | Admitting: Family Medicine

## 2023-04-02 ENCOUNTER — Ambulatory Visit (INDEPENDENT_AMBULATORY_CARE_PROVIDER_SITE_OTHER): Payer: 59 | Admitting: Family Medicine

## 2023-04-02 VITALS — BP 92/65 | Temp 98.6°F | Wt 161.0 lb

## 2023-04-02 DIAGNOSIS — E1139 Type 2 diabetes mellitus with other diabetic ophthalmic complication: Secondary | ICD-10-CM

## 2023-04-02 DIAGNOSIS — Z7985 Long-term (current) use of injectable non-insulin antidiabetic drugs: Secondary | ICD-10-CM

## 2023-04-02 LAB — BAYER DCA HB A1C WAIVED: HB A1C (BAYER DCA - WAIVED): 5.2 % (ref 4.8–5.6)

## 2023-04-02 NOTE — Assessment & Plan Note (Signed)
Doing great with A1c of 5.2. Will continue current regimen. Continue to monitor. Call with any concerns.

## 2023-04-02 NOTE — Progress Notes (Signed)
BP 92/65   Temp 98.6 F (37 C) (Oral)   Wt 161 lb (73 kg)   SpO2 99%   BMI 30.42 kg/m    Subjective:    Patient ID: Sharon Rodriguez, female    DOB: 1980/08/30, 43 y.o.   MRN: [id number]  HPI: Sharon Rodriguez is a 43 y.o. female  Chief Complaint  Patient presents with   Diabetes    Patient has upcoming Diabetic Eye Exam next Tuesday.    DIABETES Hypoglycemic episodes:no Polydipsia/polyuria: no Visual disturbance: no Chest pain: no Paresthesias: no Glucose Monitoring: yes  Accucheck frequency: Not Checking Taking Insulin?: no Blood Pressure Monitoring: not checking Retinal Examination: Not up to Date Foot Exam: Up to Date Diabetic Education: Completed Pneumovax: Up to Date Influenza:  Declined Aspirin: no  Relevant past medical, surgical, family and social history reviewed and updated as indicated. Interim medical history since our last visit reviewed. Allergies and medications reviewed and updated.  Review of Systems  Constitutional: Negative.   Respiratory: Negative.    Cardiovascular: Negative.   Musculoskeletal: Negative.   Neurological: Negative.   Psychiatric/Behavioral: Negative.      Per HPI unless specifically indicated above     Objective:    BP 92/65   Temp 98.6 F (37 C) (Oral)   Wt 161 lb (73 kg)   SpO2 99%   BMI 30.42 kg/m   Wt Readings from Last 3 Encounters:  04/02/23 161 lb (73 kg)  03/29/23 163 lb 6 oz (74.1 kg)  03/27/23 167 lb (75.8 kg)    Physical Exam Vitals and nursing note reviewed.  Constitutional:      General: She is not in acute distress.    Appearance: Normal appearance. She is not ill-appearing, toxic-appearing or diaphoretic.  HENT:     Head: Normocephalic and atraumatic.     Right Ear: External ear normal.     Left Ear: External ear normal.     Nose: Nose normal.     Mouth/Throat:     Mouth: Mucous membranes are moist.     Pharynx: Oropharynx is clear.  Eyes:     General: No scleral icterus.       Right  eye: No discharge.        Left eye: No discharge.     Extraocular Movements: Extraocular movements intact.     Conjunctiva/sclera: Conjunctivae normal.     Pupils: Pupils are equal, round, and reactive to light.  Cardiovascular:     Rate and Rhythm: Normal rate and regular rhythm.     Pulses: Normal pulses.     Heart sounds: Normal heart sounds. No murmur heard.    No friction rub. No gallop.  Pulmonary:     Effort: Pulmonary effort is normal. No respiratory distress.     Breath sounds: Normal breath sounds. No stridor. No wheezing, rhonchi or rales.  Chest:     Chest wall: No tenderness.  Musculoskeletal:        General: Normal range of motion.     Cervical back: Normal range of motion and neck supple.  Skin:    General: Skin is warm and dry.     Capillary Refill: Capillary refill takes less than 2 seconds.     Coloration: Skin is not jaundiced or pale.     Findings: No bruising, erythema, lesion or rash.  Neurological:     General: No focal deficit present.     Mental Status: She is alert and oriented to person, place,  and time. Mental status is at baseline.  Psychiatric:        Mood and Affect: Mood normal.        Behavior: Behavior normal.        Thought Content: Thought content normal.        Judgment: Judgment normal.     Results for orders placed or performed in visit on 03/12/23  TSH   Collection Time: 03/12/23  1:20 PM  Result Value Ref Range   TSH 3.860 0.450 - 4.500 uIU/mL      Assessment & Plan:   Problem List Items Addressed This Visit       Endocrine   Diabetes mellitus (HCC) - Primary   Doing great with A1c of 5.2. Will continue current regimen. Continue to monitor. Call with any concerns.       Relevant Orders   Bayer DCA Hb A1c Waived     Follow up plan: Return in about 3 months (around 07/01/2023).

## 2023-04-11 DIAGNOSIS — E119 Type 2 diabetes mellitus without complications: Secondary | ICD-10-CM | POA: Diagnosis not present

## 2023-04-11 DIAGNOSIS — H524 Presbyopia: Secondary | ICD-10-CM | POA: Diagnosis not present

## 2023-04-11 LAB — HM DIABETES EYE EXAM

## 2023-04-16 ENCOUNTER — Telehealth: Payer: Self-pay

## 2023-04-16 NOTE — Telephone Encounter (Signed)
 Patient most recent Diabetic Eye Exam has been requested.

## 2023-04-16 NOTE — Telephone Encounter (Signed)
-----   Message from Olevia Perches sent at 04/02/2023  1:16 PM EST ----- Eye doctor- [facility] had last week in January

## 2023-04-20 ENCOUNTER — Ambulatory Visit: Payer: 59 | Admitting: Family Medicine

## 2023-04-20 ENCOUNTER — Encounter: Payer: Self-pay | Admitting: Family Medicine

## 2023-04-20 VITALS — BP 80/56 | HR 90 | Wt 164.2 lb

## 2023-04-20 DIAGNOSIS — M9908 Segmental and somatic dysfunction of rib cage: Secondary | ICD-10-CM | POA: Diagnosis not present

## 2023-04-20 DIAGNOSIS — M9905 Segmental and somatic dysfunction of pelvic region: Secondary | ICD-10-CM

## 2023-04-20 DIAGNOSIS — R519 Headache, unspecified: Secondary | ICD-10-CM

## 2023-04-20 DIAGNOSIS — M9901 Segmental and somatic dysfunction of cervical region: Secondary | ICD-10-CM | POA: Diagnosis not present

## 2023-04-20 DIAGNOSIS — M9900 Segmental and somatic dysfunction of head region: Secondary | ICD-10-CM | POA: Diagnosis not present

## 2023-04-20 DIAGNOSIS — M9902 Segmental and somatic dysfunction of thoracic region: Secondary | ICD-10-CM

## 2023-04-20 DIAGNOSIS — M9909 Segmental and somatic dysfunction of abdomen and other regions: Secondary | ICD-10-CM

## 2023-04-20 DIAGNOSIS — M9904 Segmental and somatic dysfunction of sacral region: Secondary | ICD-10-CM | POA: Diagnosis not present

## 2023-04-20 DIAGNOSIS — M545 Low back pain, unspecified: Secondary | ICD-10-CM

## 2023-04-20 DIAGNOSIS — M9903 Segmental and somatic dysfunction of lumbar region: Secondary | ICD-10-CM

## 2023-04-20 DIAGNOSIS — G8929 Other chronic pain: Secondary | ICD-10-CM | POA: Diagnosis not present

## 2023-04-20 NOTE — Progress Notes (Signed)
 BP (!) 80/56   Pulse 90   Wt 164 lb 3.2 oz (74.5 kg)   SpO2 99%   BMI 31.03 kg/m    Subjective:    Patient ID: Sharon Rodriguez, female    DOB: Mar 10, 1981, 43 y.o.   MRN: [id number]  HPI: Sharon Rodriguez is a 43 y.o. female  Chief Complaint  Patient presents with   Back Pain   Headache   Sharon Rodriguez presents today for evaluation and possible treatment with OMT for headache and low back and hip pain. She notes that she has been having pain in both her hips that has been coming and going for the past few weeks. She has also had a headache for 2 days. She has been laying down and that seems like it helps. She notes that she has been more stiffness in the AM and some spasms. Pain will radiate into the top of her thighs. Pain is aching and sore and stiff. Better with OMT and rest, worse with a lot of movement. She is otherwise feeling well with no other concerns or complaints at this time.   Relevant past medical, surgical, family and social history reviewed and updated as indicated. Interim medical history since our last visit reviewed. Allergies and medications reviewed and updated.  Review of Systems  Constitutional: Negative.   Respiratory: Negative.    Cardiovascular: Negative.   Gastrointestinal: Negative.   Musculoskeletal:  Positive for arthralgias, back pain and myalgias. Negative for gait problem, joint swelling, neck pain and neck stiffness.  Skin: Negative.   Neurological:  Positive for headaches. Negative for dizziness, tremors, seizures, syncope, facial asymmetry, speech difficulty, weakness, light-headedness and numbness.  Psychiatric/Behavioral: Negative.      Per HPI unless specifically indicated above     Objective:    BP (!) 80/56   Pulse 90   Wt 164 lb 3.2 oz (74.5 kg)   SpO2 99%   BMI 31.03 kg/m   Wt Readings from Last 3 Encounters:  04/20/23 164 lb 3.2 oz (74.5 kg)  04/02/23 161 lb (73 kg)  03/29/23 163 lb 6 oz (74.1 kg)    Physical Exam Vitals and  nursing note reviewed.  Constitutional:      General: She is not in acute distress.    Appearance: Normal appearance. She is not ill-appearing.  HENT:     Head: Normocephalic and atraumatic.     Right Ear: External ear normal.     Left Ear: External ear normal.     Nose: Nose normal.     Mouth/Throat:     Mouth: Mucous membranes are moist.     Pharynx: Oropharynx is clear.  Eyes:     Extraocular Movements: Extraocular movements intact.     Conjunctiva/sclera: Conjunctivae normal.     Pupils: Pupils are equal, round, and reactive to light.  Neck:     Vascular: No carotid bruit.  Cardiovascular:     Rate and Rhythm: Normal rate.     Pulses: Normal pulses.  Pulmonary:     Effort: Pulmonary effort is normal. No respiratory distress.  Abdominal:     General: Abdomen is flat. There is no distension.     Palpations: Abdomen is soft. There is no mass.     Tenderness: There is no abdominal tenderness. There is no right CVA tenderness, left CVA tenderness, guarding or rebound.     Hernia: No hernia is present.  Musculoskeletal:     Cervical back: No muscular tenderness.  Lymphadenopathy:  Cervical: No cervical adenopathy.  Skin:    General: Skin is warm and dry.     Capillary Refill: Capillary refill takes less than 2 seconds.     Coloration: Skin is not jaundiced or pale.     Findings: No bruising, erythema, lesion or rash.  Neurological:     General: No focal deficit present.     Mental Status: She is alert. Mental status is at baseline.  Psychiatric:        Mood and Affect: Mood normal.        Behavior: Behavior normal.        Thought Content: Thought content normal.        Judgment: Judgment normal.    Musculoskeletal:  Exam found Decreased ROM, Tissue texture changes, Tenderness to palpation, and Asymmetry of patient's  head, neck, thorax, ribs, lumbar, pelvis, sacrum, and abdomen Osteopathic Structural Exam:   Head: hypertonic suboccipital muscles, OAESSR, SBS  SRR  Neck: C3ESRR, hypertonic paraspinal muscles  Thorax: T3-5SLRR  Ribs: Ribs 3-6 locked up on the L, Rib 6 locked up on the R  Lumbar: QL hypertonic on the R  Pelvis: Posterior R innominat  Sacrum: L on R torsion  Abdomen: diaphragm hypertonic bilaterally R>L  Results for orders placed or performed in visit on 04/16/23  HM DIABETES EYE EXAM   Collection Time: 04/11/23 11:05 AM  Result Value Ref Range   HM Diabetic Eye Exam No Retinopathy No Retinopathy      Assessment & Plan:   Problem List Items Addressed This Visit       Other   Chronic bilateral low back pain without sciatica - Primary (Chronic)   She does have somatic dysfunction that is contributing to her symptoms. Treated today with good results as below. Call with any concerns. Continue to monitor.       Other Visit Diagnoses       Acute nonintractable headache, unspecified headache type       She does have somatic dysfunction that is likely contributing to her symptoms. Treated today with good results as below. Call with any concerns.     Somatic dysfunction of lumbar region         Thoracic segment dysfunction         Segmental dysfunction of rib cage         Segmental dysfunction of abdomen         Somatic dysfunction of sacral region         Somatic dysfunction of pelvis region         Cervical segment dysfunction         Head region somatic dysfunction          After verbal consent was obtained, patient was treated today with osteopathic manipulative medicine to the regions of the head, neck, thorax, ribs, lumbar, pelvis, sacrum, and abdomen using the techniques of cranial, myofascial release, counterstrain, muscle energy, HVLA, and soft tissue. Areas of compensation relating to her primary pain source also treated. Patient tolerated the procedure well with good objective and good subjective improvement in symptoms. She left the room in good condition. She was advised to stay well hydrated and that she may have  some soreness following the procedure. If not improving or worsening, she will call and come in. She will return for reevaluation  on a PRN basis.   Follow up plan: Return if symptoms worsen or fail to improve.

## 2023-04-20 NOTE — Assessment & Plan Note (Signed)
She does have somatic dysfunction that is contributing to her symptoms. Treated today with good results as below. Call with any concerns. Continue to monitor.  

## 2023-04-29 DIAGNOSIS — G894 Chronic pain syndrome: Secondary | ICD-10-CM | POA: Diagnosis not present

## 2023-04-29 DIAGNOSIS — R5382 Chronic fatigue, unspecified: Secondary | ICD-10-CM | POA: Diagnosis not present

## 2023-04-29 DIAGNOSIS — M47816 Spondylosis without myelopathy or radiculopathy, lumbar region: Secondary | ICD-10-CM | POA: Diagnosis not present

## 2023-05-01 DIAGNOSIS — M47816 Spondylosis without myelopathy or radiculopathy, lumbar region: Secondary | ICD-10-CM | POA: Diagnosis not present

## 2023-05-01 DIAGNOSIS — G894 Chronic pain syndrome: Secondary | ICD-10-CM | POA: Diagnosis not present

## 2023-05-01 DIAGNOSIS — R5382 Chronic fatigue, unspecified: Secondary | ICD-10-CM | POA: Diagnosis not present

## 2023-05-02 DIAGNOSIS — M47816 Spondylosis without myelopathy or radiculopathy, lumbar region: Secondary | ICD-10-CM | POA: Diagnosis not present

## 2023-05-02 DIAGNOSIS — G894 Chronic pain syndrome: Secondary | ICD-10-CM | POA: Diagnosis not present

## 2023-05-02 DIAGNOSIS — R5382 Chronic fatigue, unspecified: Secondary | ICD-10-CM | POA: Diagnosis not present

## 2023-05-21 ENCOUNTER — Ambulatory Visit (INDEPENDENT_AMBULATORY_CARE_PROVIDER_SITE_OTHER): Payer: 59 | Admitting: Family Medicine

## 2023-05-21 ENCOUNTER — Encounter: Payer: Self-pay | Admitting: Family Medicine

## 2023-05-21 VITALS — BP 132/74 | HR 81 | Resp 15 | Ht 60.98 in | Wt 164.0 lb

## 2023-05-21 DIAGNOSIS — M9901 Segmental and somatic dysfunction of cervical region: Secondary | ICD-10-CM | POA: Diagnosis not present

## 2023-05-21 DIAGNOSIS — M9904 Segmental and somatic dysfunction of sacral region: Secondary | ICD-10-CM

## 2023-05-21 DIAGNOSIS — M9905 Segmental and somatic dysfunction of pelvic region: Secondary | ICD-10-CM | POA: Diagnosis not present

## 2023-05-21 DIAGNOSIS — M9908 Segmental and somatic dysfunction of rib cage: Secondary | ICD-10-CM

## 2023-05-21 DIAGNOSIS — G8929 Other chronic pain: Secondary | ICD-10-CM | POA: Diagnosis not present

## 2023-05-21 DIAGNOSIS — M9902 Segmental and somatic dysfunction of thoracic region: Secondary | ICD-10-CM | POA: Diagnosis not present

## 2023-05-21 DIAGNOSIS — M545 Low back pain, unspecified: Secondary | ICD-10-CM

## 2023-05-21 DIAGNOSIS — M9909 Segmental and somatic dysfunction of abdomen and other regions: Secondary | ICD-10-CM

## 2023-05-21 DIAGNOSIS — M9900 Segmental and somatic dysfunction of head region: Secondary | ICD-10-CM

## 2023-05-21 DIAGNOSIS — M9903 Segmental and somatic dysfunction of lumbar region: Secondary | ICD-10-CM

## 2023-05-21 NOTE — Assessment & Plan Note (Signed)
She does have somatic dysfunction that is contributing to her symptoms. Treated today with good results as below. Call with any concerns.  

## 2023-05-21 NOTE — Progress Notes (Signed)
 BP 132/74 (BP Location: Left Arm, Patient Position: Sitting, Cuff Size: Normal)   Pulse 81   Resp 15   Ht 5' 0.98" (1.549 m)   Wt 164 lb (74.4 kg)   LMP  (LMP Unknown)   SpO2 98%   BMI 31.00 kg/m    Subjective:    Patient ID: Sharon Rodriguez, female    DOB: 1980-04-24, 43 y.o.   MRN: [id number]  HPI: Sharon Rodriguez is a 43 y.o. female  Chief Complaint  Patient presents with   Pelvic Pain   Clint presents today for evaluation and possible treatment with OMT for low back and R pelvic pain. 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 She notes that her R hip is hurting. Pain is aching and sore and radiating into her R buttock. Pain is better with rest and with OMT. Worse with increased activity. She is otherwise feeling well with no other concerns or complaints at this time.   Relevant past medical, surgical, family and social history reviewed and updated as indicated. Interim medical history since our last visit reviewed. Allergies and medications reviewed and updated.  Review of Systems  Constitutional: Negative.   Respiratory: Negative.    Cardiovascular: Negative.   Musculoskeletal:  Positive for arthralgias, back pain and myalgias. Negative for gait problem, joint swelling, neck pain and neck stiffness.  Skin: Negative.   Neurological: Negative.   Psychiatric/Behavioral: Negative.      Per HPI unless specifically indicated above     Objective:    BP 132/74 (BP Location: Left Arm, Patient Position: Sitting, Cuff Size: Normal)   Pulse 81   Resp 15   Ht 5' 0.98" (1.549 m)   Wt 164 lb (74.4 kg)   LMP  (LMP Unknown)   SpO2 98%   BMI 31.00 kg/m   Wt Readings from Last 3 Encounters:  05/21/23 164 lb (74.4 kg)  04/20/23 164 lb 3.2 oz (74.5 kg)  04/02/23 161 lb (73 kg)    Physical Exam Vitals and nursing note reviewed.  Constitutional:      General: She is not in acute distress.    Appearance: Normal  appearance. She is not ill-appearing.  HENT:     Head: Normocephalic and atraumatic.     Right Ear: External ear normal.     Left Ear: External ear normal.     Nose: Nose normal.     Mouth/Throat:     Mouth: Mucous membranes are moist.     Pharynx: Oropharynx is clear.  Eyes:     Extraocular Movements: Extraocular movements intact.     Conjunctiva/sclera: Conjunctivae normal.     Pupils: Pupils are equal, round, and reactive to light.  Neck:     Vascular: No carotid bruit.  Cardiovascular:     Rate and Rhythm: Normal rate.     Pulses: Normal pulses.  Pulmonary:     Effort: Pulmonary effort is normal. No respiratory distress.  Abdominal:     General: Abdomen is flat. There is no distension.     Palpations: Abdomen is soft. There is no mass.     Tenderness: There is no abdominal tenderness. There is no right CVA tenderness, left CVA tenderness, guarding or rebound.     Hernia: No hernia is present.  Musculoskeletal:     Cervical back: No muscular tenderness.  Lymphadenopathy:     Cervical: No cervical adenopathy.  Skin:    General:  Skin is warm and dry.     Capillary Refill: Capillary refill takes less than 2 seconds.     Coloration: Skin is not jaundiced or pale.     Findings: No bruising, erythema, lesion or rash.  Neurological:     General: No focal deficit present.     Mental Status: She is alert. Mental status is at baseline.  Psychiatric:        Mood and Affect: Mood normal.        Behavior: Behavior normal.        Thought Content: Thought content normal.        Judgment: Judgment normal.    Musculoskeletal:  Exam found Decreased ROM, Tissue texture changes, Tenderness to palpation, and Asymmetry of patient's  head, neck, thorax, ribs, lumbar, pelvis, sacrum, and abdomen Osteopathic Structural Exam:   Head: hypertonic suboccipital muscles, OAESSR, SBS SBRR   Neck: C4ESRR  Thorax: T4-5SLRR  Ribs: Ribs 6-8 locked up on the R  Lumbar: QL hypertonic on the R, Psoas  hypertonic on the R, L4-5SRRL  Pelvis: anterior R innominate  Sacrum: R on R torsion  Abdomen: diaphragm hypertonic on the R  Results for orders placed or performed in visit on 04/16/23  HM DIABETES EYE EXAM   Collection Time: 04/11/23 11:05 AM  Result Value Ref Range   HM Diabetic Eye Exam No Retinopathy No Retinopathy      Assessment & Plan:   Problem List Items Addressed This Visit       Other   Chronic bilateral low back pain without sciatica - Primary (Chronic)   She does have somatic dysfunction that is contributing to her symptoms. Treated today with good results as below. Call with any concerns.       Other Visit Diagnoses       Somatic dysfunction of lumbar region         Thoracic segment dysfunction         Segmental dysfunction of rib cage         Segmental dysfunction of abdomen         Somatic dysfunction of sacral region         Somatic dysfunction of pelvis region         Cervical segment dysfunction         Head region somatic dysfunction          After verbal consent was obtained, patient was treated today with osteopathic manipulative medicine to the regions of the head, neck, thorax, ribs, lumbar, pelvis, sacrum, and abdomen using the techniques of cranial, myofascial release, counterstrain, muscle energy, HVLA, and soft tissue. Areas of compensation relating to her primary pain source also treated. Patient tolerated the procedure well with good objective and fair subjective improvement in symptoms. She left the room in good condition. She was advised to stay well hydrated and that she may have some soreness following the procedure. If not improving or worsening, she will call and come in. She will return for reevaluation  on a PRN basis.   Follow up plan: Return if symptoms worsen or fail to improve.

## 2023-05-29 DIAGNOSIS — M47816 Spondylosis without myelopathy or radiculopathy, lumbar region: Secondary | ICD-10-CM | POA: Diagnosis not present

## 2023-05-29 DIAGNOSIS — G894 Chronic pain syndrome: Secondary | ICD-10-CM | POA: Diagnosis not present

## 2023-05-29 DIAGNOSIS — R5382 Chronic fatigue, unspecified: Secondary | ICD-10-CM | POA: Diagnosis not present

## 2023-05-31 DIAGNOSIS — M47816 Spondylosis without myelopathy or radiculopathy, lumbar region: Secondary | ICD-10-CM | POA: Diagnosis not present

## 2023-05-31 DIAGNOSIS — R5382 Chronic fatigue, unspecified: Secondary | ICD-10-CM | POA: Diagnosis not present

## 2023-05-31 DIAGNOSIS — G894 Chronic pain syndrome: Secondary | ICD-10-CM | POA: Diagnosis not present

## 2023-06-01 DIAGNOSIS — R5382 Chronic fatigue, unspecified: Secondary | ICD-10-CM | POA: Diagnosis not present

## 2023-06-01 DIAGNOSIS — G894 Chronic pain syndrome: Secondary | ICD-10-CM | POA: Diagnosis not present

## 2023-06-01 DIAGNOSIS — M47816 Spondylosis without myelopathy or radiculopathy, lumbar region: Secondary | ICD-10-CM | POA: Diagnosis not present

## 2023-06-05 ENCOUNTER — Encounter: Payer: Self-pay | Admitting: Family Medicine

## 2023-06-14 ENCOUNTER — Other Ambulatory Visit: Payer: Self-pay | Admitting: Family Medicine

## 2023-06-15 ENCOUNTER — Encounter: Payer: Self-pay | Admitting: Family Medicine

## 2023-06-15 NOTE — Telephone Encounter (Signed)
 Noted.

## 2023-06-15 NOTE — Telephone Encounter (Signed)
 Sugars were low last visit. Needs 3 month follow up. Please keep scheduled appointment in April

## 2023-06-21 ENCOUNTER — Ambulatory Visit
Payer: 59 | Attending: Student in an Organized Health Care Education/Training Program | Admitting: Student in an Organized Health Care Education/Training Program

## 2023-06-21 DIAGNOSIS — Z79891 Long term (current) use of opiate analgesic: Secondary | ICD-10-CM | POA: Diagnosis not present

## 2023-06-21 DIAGNOSIS — M47816 Spondylosis without myelopathy or radiculopathy, lumbar region: Secondary | ICD-10-CM | POA: Diagnosis not present

## 2023-06-21 DIAGNOSIS — M797 Fibromyalgia: Secondary | ICD-10-CM

## 2023-06-21 DIAGNOSIS — G894 Chronic pain syndrome: Secondary | ICD-10-CM | POA: Diagnosis not present

## 2023-06-21 DIAGNOSIS — Z79899 Other long term (current) drug therapy: Secondary | ICD-10-CM | POA: Diagnosis not present

## 2023-06-21 DIAGNOSIS — R5382 Chronic fatigue, unspecified: Secondary | ICD-10-CM | POA: Diagnosis not present

## 2023-06-21 MED ORDER — OXYCODONE-ACETAMINOPHEN 5-325 MG PO TABS: 1.0000 | ORAL_TABLET | Freq: Four times a day (QID) | ORAL | 0 refills | Status: DC | PRN

## 2023-06-21 MED ORDER — OXYCODONE-ACETAMINOPHEN 5-325 MG PO TABS: 1.0000 | ORAL_TABLET | Freq: Four times a day (QID) | ORAL | 0 refills | Status: AC | PRN

## 2023-06-21 NOTE — Progress Notes (Signed)
 Nursing Pain Medication Assessment:  Safety precautions to be maintained throughout the outpatient stay will include: orient to surroundings, keep bed in low position, maintain call bell within reach at all times, provide assistance with transfer out of bed and ambulation.  Medication Inspection Compliance: Pill count conducted under aseptic conditions, in front of the patient. Neither the pills nor the bottle was removed from the patient's sight at any time. Once count was completed pills were immediately returned to the patient in their original bottle.  Medication: Oxycodone/APAP Pill/Patch Count:  26 of 120 pills remain Pill/Patch Appearance: Markings consistent with prescribed medication Bottle Appearance: Standard pharmacy container. Clearly labeled. Filled Date: 03 / 17 / 2025 Last Medication intake:  TodaySafety precautions to be maintained throughout the outpatient stay will include: orient to surroundings, keep bed in low position, maintain call bell within reach at all times, provide assistance with transfer out of bed and ambulation.

## 2023-06-21 NOTE — Progress Notes (Signed)
 PROVIDER NOTE: Interpretation of information contained herein should be left to medically-trained personnel. Specific patient instructions are provided elsewhere under "Patient Instructions" section of medical record. This document was created in part using AI and STT-dictation technology, any transcriptional errors that may result from this process are unintentional.  Patient: Sharon Rodriguez  Service: E/M   PCP: Dorcas Carrow, DO  DOB: June 05, 1980  DOS: 06/21/2023  Provider: Edward Jolly, MD  MRN: [id number]  Delivery: Face-to-face  Specialty: Interventional Pain Management  Type: Established Patient  Setting: Ambulatory outpatient facility  Specialty designation: 09  Referring Prov.: Dorcas Carrow, DO  Location: Outpatient office facility       HPI  Sharon Rodriguez, a 43 y.o. year old female, is here today because of her Chronic pain syndrome [G89.4]. Sharon Rodriguez primary complain today is Back Pain (lower)  Pain Assessment: Severity of Chronic pain is reported as a 7 /10. Location: Back Lower/denies. Onset: More than a month ago. Quality: Lambert Mody, Aching. Timing: Intermittent. Modifying factor(s): ice, heat, TENS, medications. Vitals:  vitals were not taken for this visit.  BMI: Estimated body mass index is 31 kg/m as calculated from the following:   Height as of 05/21/23: 5' 0.98" (1.549 m).   Weight as of 05/21/23: 164 lb (74.4 kg). Last encounter: 03/27/2023. Last procedure: Visit date not found.  Reason for encounter: medication management.  The patient indicates doing well with the current medication regimen no adverse reaction orside effects reported to the medication.  Pharmacotherapy Assessment  Analgesic: Oxycodone 5-325 mg tablet every 6 hours as needed for severe pain.  MME = 30  Monitoring: [place] PMP: PDMP reviewed during this encounter.       Pharmacotherapy: No side-effects or adverse reactions reported. Compliance: No problems identified. Effectiveness: Clinically  acceptable.  Concepcion Elk, RN  06/21/2023 10:50 AM  Sign when Signing Visit Nursing Pain Medication Assessment:  Safety precautions to be maintained throughout the outpatient stay will include: orient to surroundings, keep bed in low position, maintain call bell within reach at all times, provide assistance with transfer out of bed and ambulation.  Medication Inspection Compliance: Pill count conducted under aseptic conditions, in front of the patient. Neither the pills nor the bottle was removed from the patient's sight at any time. Once count was completed pills were immediately returned to the patient in their original bottle.  Medication: Oxycodone/APAP Pill/Patch Count:  26 of 120 pills remain Pill/Patch Appearance: Markings consistent with prescribed medication Bottle Appearance: Standard pharmacy container. Clearly labeled. Filled Date: 03 / 17 / 2025 Last Medication intake:  TodaySafety precautions to be maintained throughout the outpatient stay will include: orient to surroundings, keep bed in low position, maintain call bell within reach at all times, provide assistance with transfer out of bed and ambulation.     No results found for: "CBDTHCR" No results found for: "D8THCCBX" No results found for: "D9THCCBX"  UDS:  Summary  Date Value Ref Range Status  06/27/2022 Note  Final    Comment:    ==================================================================== ToxASSURE Select 13 (MW) ==================================================================== Test                             Result       Flag       Units  Drug Present and Declared for Prescription Verification   Oxycodone  1308         EXPECTED   ng/mg creat   Oxymorphone                    207          EXPECTED   ng/mg creat   Noroxycodone                   1342         EXPECTED   ng/mg creat   Noroxymorphone                 128          EXPECTED   ng/mg creat    Sources of oxycodone are  scheduled prescription medications.    Oxymorphone, noroxycodone, and noroxymorphone are expected    metabolites of oxycodone. Oxymorphone is also available as a    scheduled prescription medication.  Drug Absent but Declared for Prescription Verification   Amphetamine                    Not Detected UNEXPECTED ng/mg creat ==================================================================== Test                      Result    Flag   Units      Ref Range   Creatinine              221              mg/dL      >=16 ==================================================================== Declared Medications:  The flagging and interpretation on this report are based on the  following declared medications.  Unexpected results may arise from  inaccuracies in the declared medications.   **Note: The testing scope of this panel includes these medications:   Amphetamine (Adderall)  Oxycodone (Percocet)   **Note: The testing scope of this panel does not include the  following reported medications:   Acetaminophen (Percocet)  Albuterol (Ventolin HFA)  Epinephrine (EpiPen)  Ondansetron (Zofran)  Semaglutide (Ozempic)  Thyroid: Liothyronine/Levothyroxine ([place]) ==================================================================== For clinical consultation, please call [phone]. ====================================================================       ROS  Constitutional: Denies any fever or chills Gastrointestinal: No reported hemesis, hematochezia, vomiting, or acute GI distress Musculoskeletal: Denies any acute onset joint swelling, redness, loss of ROM, or weakness Neurological: No reported episodes of acute onset apraxia, aphasia, dysarthria, agnosia, amnesia, paralysis, loss of coordination, or loss of consciousness  Medication Review  EPINEPHrine, FreeStyle Libre 3 Sensor, ONE TOUCH ULTRA 2, Pen Needles, Semaglutide (1 MG/DOSE), Vitamin D (Ergocalciferol), albuterol,  amphetamine-dextroamphetamine, glucose blood, levothyroxine, ondansetron, onetouch ultrasoft, and oxyCODONE-acetaminophen  History Review  Allergy: Sharon Rodriguez is allergic to ibuprofen, metformin and related, and promethazine. Drug: Sharon Rodriguez  reports no history of drug use. Alcohol:  reports no history of alcohol use. Tobacco:  reports that she has never smoked. She has never used smokeless tobacco. Social: Sharon Rodriguez  reports that she has never smoked. She has never used smokeless tobacco. She reports that she does not drink alcohol and does not use drugs. Medical:  has a past medical history of ADD (attention deficit disorder), Anxiety, Asthma, Biliary dyskinesia (12/27/2017), Chronic bronchitis (HCC), Chronic fatigue, Chronic pain, Depression, Diabetes mellitus without complication (HCC), Fibromyalgia, GERD (gastroesophageal reflux disease), History of stomach ulcers (2019), Hypothyroidism (01/27/2015), Learning disability, Osteoarthritis, Rheumatoid arthritis (HCC), Thyroid disease, and TMJ (dislocation of temporomandibular joint). Surgical: Sharon Rodriguez  has a past surgical history that includes Cesarean section;  Esophagogastroduodenoscopy (egd) with propofol (N/A, 12/07/2017); and Cholecystectomy (N/A, 01/14/2018). Family: family history includes ADD / ADHD in her daughter and mother; Allergies in her daughter and daughter; Arthritis in her brother and paternal grandmother; COPD in her mother; Cancer in her maternal grandmother; Dementia in her paternal grandmother; Diabetes in her father and mother; Heart attack in her maternal grandfather; Heart disease in her maternal grandfather; Hyperlipidemia in her father; Learning disabilities in her mother; Mental illness in her daughter, father, and sister; Stroke in her maternal grandfather; Thyroid disease in her daughter.  Laboratory Chemistry Profile   Renal Lab Results  Component Value Date   BUN 10 12/19/2022   CREATININE 0.76 12/19/2022   BCR 13  12/19/2022   GFRAA 94 04/16/2020   GFRNONAA 82 04/16/2020    Hepatic Lab Results  Component Value Date   AST 27 12/19/2022   ALT 12 12/19/2022   ALBUMIN 4.0 12/19/2022   ALKPHOS 71 12/19/2022   LIPASE 43 04/10/2017    Electrolytes Lab Results  Component Value Date   NA 138 12/19/2022   K 4.3 12/19/2022   CL 103 12/19/2022   CALCIUM 8.7 12/19/2022   MG 2.2 07/29/2020    Bone Lab Results  Component Value Date   VD25OH 33.5 12/19/2022    Inflammation (CRP: Acute Phase) (ESR: Chronic Phase) No results found for: "CRP", "ESRSEDRATE", "LATICACIDVEN"       Note: Above Lab results reviewed.  Recent Imaging Review  LONG TERM MONITOR (3-14 DAYS) HR 62 - 167, average 90 bpm. Rare supraventricular and ventricular ectopy. No atrial fibrillation. No sustained arrhythmias.  Sheria Lang T. Lalla Brothers, MD, [facility], [facility] Cardiac Electrophysiology Note: Reviewed        Physical Exam  General appearance: alert, cooperative, in no distress, and Well nourished, well developed, and well hydrated. In no apparent acute distress Mental status: Alert, oriented x 3 (person, place, & time)       Respiratory: No evidence of acute respiratory distress Eyes: PERLA Vitals: There were no vitals taken for this visit. BMI: Estimated body mass index is 31 kg/m as calculated from the following:   Height as of 05/21/23: 5' 0.98" (1.549 m).   Weight as of 05/21/23: 164 lb (74.4 kg). Ideal: Patient weight not recorded  Assessment   Diagnosis Status  1. Chronic pain syndrome   2. Spondylosis without myelopathy or radiculopathy, lumbar region   3. Chronic fatigue   4. Medication management   5. Long term current use of opiate analgesic   6. Fibromyalgia    Controlled Controlled Controlled   Plan of Care  Problem-specific:  Assessment and Plan We will continue on current regimen.   Sharon Rodriguez has a current medication list which includes the following long-term medication(s): albuterol and  levothyroxine.  Pharmacotherapy (Medications Ordered): Meds ordered this encounter  Medications   oxyCODONE-acetaminophen (PERCOCET) 5-325 MG tablet    Sig: Take 1 tablet by mouth every 6 (six) hours as needed for severe pain (pain score 7-10). Must last 30 days.    Dispense:  120 tablet    Refill:  0    [place] STOP ACT - Not applicable. Fill one day early if pharmacy is closed on scheduled refill date.   oxyCODONE-acetaminophen (PERCOCET) 5-325 MG tablet    Sig: Take 1 tablet by mouth every 6 (six) hours as needed for severe pain (pain score 7-10). Must last 30 days.    Dispense:  120 tablet    Refill:  0  [place] STOP ACT - Not applicable. Fill one day early if pharmacy is closed on scheduled refill date.   oxyCODONE-acetaminophen (PERCOCET) 5-325 MG tablet    Sig: Take 1 tablet by mouth every 6 (six) hours as needed for severe pain (pain score 7-10). Must last 30 days.    Dispense:  120 tablet    Refill:  0    [place] STOP ACT - Not applicable. Fill one day early if pharmacy is closed on scheduled refill date.   Orders:  Orders Placed This Encounter  Procedures   ToxASSURE Select 13 (MW), Urine    Volume: 30 ml(s). Minimum 3 ml of urine is needed. Document temperature of fresh sample. Indications: Long term (current) use of opiate analgesic (V78.469)    Release to patient:   Immediate   Follow-up plan:   Return in about 3 months (around 09/20/2023) for (F2F), (MM), Randalyn Rhea NP.    Recent Visits Date Type Provider Dept  03/27/23 Office Visit Edward Jolly, MD [facility]  Showing recent visits within past 90 days and meeting all other requirements Today's Visits Date Type Provider Dept  06/21/23 Office Visit Edward Jolly, MD [facility]  Showing today's visits and meeting all other requirements Future Appointments Date Type Provider Dept  09/18/23 Appointment Bettey Costa, NP [facility]  Showing future appointments within next 90 days and meeting  all other requirements  I discussed the assessment and treatment plan with the patient. The patient was provided an opportunity to ask questions and all were answered. The patient agreed with the plan and demonstrated an understanding of the instructions.  Patient advised to call back or seek an in-person evaluation if the symptoms or condition worsens. Duration of encounter: 30 minutes.  Total time on encounter, as per AMA guidelines included both the face-to-face and non-face-to-face time personally spent by the physician and/or other qualified health care professional(s) on the day of the encounter (includes time in activities that require the physician or other qualified health care professional and does not include time in activities normally performed by clinical staff). Physician's time may include the following activities when performed: Preparing to see the patient (e.g., pre-charting review of records, searching for previously ordered imaging, lab work, and nerve conduction tests) Review of prior analgesic pharmacotherapies. Reviewing PMP Interpreting ordered tests (e.g., lab work, imaging, nerve conduction tests) Performing post-procedure evaluations, including interpretation of diagnostic procedures Obtaining and/or reviewing separately obtained history Performing a medically appropriate examination and/or evaluation Counseling and educating the patient/family/caregiver Ordering medications, tests, or procedures Referring and communicating with other health care professionals (when not separately reported) Documenting clinical information in the electronic or other health record Independently interpreting results (not separately reported) and communicating results to the patient/ family/caregiver Care coordination (not separately reported)  Note by: Edward Jolly, MD (TTS and AI technology used. I apologize for any typographical errors that were not detected and corrected.) Date:  06/21/2023; Time: 12:21 PM

## 2023-06-28 LAB — TOXASSURE SELECT 13 (MW), URINE

## 2023-06-29 DIAGNOSIS — M47816 Spondylosis without myelopathy or radiculopathy, lumbar region: Secondary | ICD-10-CM | POA: Diagnosis not present

## 2023-06-29 DIAGNOSIS — R5382 Chronic fatigue, unspecified: Secondary | ICD-10-CM | POA: Diagnosis not present

## 2023-06-29 DIAGNOSIS — G894 Chronic pain syndrome: Secondary | ICD-10-CM | POA: Diagnosis not present

## 2023-07-01 DIAGNOSIS — G894 Chronic pain syndrome: Secondary | ICD-10-CM | POA: Diagnosis not present

## 2023-07-01 DIAGNOSIS — M47816 Spondylosis without myelopathy or radiculopathy, lumbar region: Secondary | ICD-10-CM | POA: Diagnosis not present

## 2023-07-01 DIAGNOSIS — R5382 Chronic fatigue, unspecified: Secondary | ICD-10-CM | POA: Diagnosis not present

## 2023-07-02 DIAGNOSIS — M47816 Spondylosis without myelopathy or radiculopathy, lumbar region: Secondary | ICD-10-CM | POA: Diagnosis not present

## 2023-07-02 DIAGNOSIS — R5382 Chronic fatigue, unspecified: Secondary | ICD-10-CM | POA: Diagnosis not present

## 2023-07-02 DIAGNOSIS — G894 Chronic pain syndrome: Secondary | ICD-10-CM | POA: Diagnosis not present

## 2023-07-03 ENCOUNTER — Other Ambulatory Visit: Payer: Self-pay | Admitting: Family Medicine

## 2023-07-03 ENCOUNTER — Encounter: Payer: Self-pay | Admitting: Family Medicine

## 2023-07-03 ENCOUNTER — Ambulatory Visit (INDEPENDENT_AMBULATORY_CARE_PROVIDER_SITE_OTHER): Admitting: Family Medicine

## 2023-07-03 DIAGNOSIS — E559 Vitamin D deficiency, unspecified: Secondary | ICD-10-CM | POA: Diagnosis not present

## 2023-07-03 DIAGNOSIS — E1139 Type 2 diabetes mellitus with other diabetic ophthalmic complication: Secondary | ICD-10-CM

## 2023-07-03 DIAGNOSIS — K219 Gastro-esophageal reflux disease without esophagitis: Secondary | ICD-10-CM

## 2023-07-03 DIAGNOSIS — E039 Hypothyroidism, unspecified: Secondary | ICD-10-CM

## 2023-07-03 DIAGNOSIS — F339 Major depressive disorder, recurrent, unspecified: Secondary | ICD-10-CM

## 2023-07-03 LAB — BAYER DCA HB A1C WAIVED: HB A1C (BAYER DCA - WAIVED): 5.5 % (ref 4.8–5.6)

## 2023-07-03 MED ORDER — OZEMPIC (1 MG/DOSE) 4 MG/3ML ~~LOC~~ SOPN: PEN_INJECTOR | SUBCUTANEOUS | 1 refills | Status: DC

## 2023-07-03 NOTE — Progress Notes (Signed)
 Temp 98.7 F (37.1 C) (Oral)   Resp 17   Ht 5' 0.98" (1.549 m)   Wt 168 lb (76.2 kg)   LMP 06/26/2023 (Exact Date)   SpO2 98%   BMI 31.76 kg/m    Subjective:    Patient ID: Sharon Rodriguez, female    DOB: Sep 26, 1980, 43 y.o.   MRN: [id number]  HPI: Sharon Rodriguez is a 43 y.o. female  Chief Complaint  Patient presents with   Diabetes    No home checks.    DIABETES Hypoglycemic episodes:no Polydipsia/polyuria: no Visual disturbance: no Chest pain: no Paresthesias: no Glucose Monitoring: no Taking Insulin?: no Blood Pressure Monitoring: not checking Retinal Examination: Up to Date Foot Exam: Up to Date Diabetic Education: Completed Pneumovax: Up to Date Influenza:Not Up to Date Aspirin: no  HYPOTHYROIDISM Thyroid  control status:controlled Satisfied with current treatment? yes Medication side effects: no Medication compliance: excellent compliance Recent dose adjustment:no Fatigue: yes Cold intolerance: no Heat intolerance: no Weight gain: no Weight loss: no Constipation: no Diarrhea/loose stools: no Palpitations: no Lower extremity edema: no Anxiety/depressed mood: no   Relevant past medical, surgical, family and social history reviewed and updated as indicated. Interim medical history since our last visit reviewed. Allergies and medications reviewed and updated.  Review of Systems  Constitutional: Negative.   Respiratory: Negative.    Cardiovascular: Negative.   Musculoskeletal: Negative.   Neurological: Negative.   Psychiatric/Behavioral: Negative.      Per HPI unless specifically indicated above     Objective:    Temp 98.7 F (37.1 C) (Oral)   Resp 17   Ht 5' 0.98" (1.549 m)   Wt 168 lb (76.2 kg)   LMP 06/26/2023 (Exact Date)   SpO2 98%   BMI 31.76 kg/m   Wt Readings from Last 3 Encounters:  07/03/23 168 lb (76.2 kg)  05/21/23 164 lb (74.4 kg)  04/20/23 164 lb 3.2 oz (74.5 kg)    Physical Exam Vitals and nursing note  reviewed.  Constitutional:      General: She is not in acute distress.    Appearance: Normal appearance. She is not ill-appearing, toxic-appearing or diaphoretic.  HENT:     Head: Normocephalic and atraumatic.     Right Ear: External ear normal.     Left Ear: External ear normal.     Nose: Nose normal.     Mouth/Throat:     Mouth: Mucous membranes are moist.     Pharynx: Oropharynx is clear.  Eyes:     General: No scleral icterus.       Right eye: No discharge.        Left eye: No discharge.     Extraocular Movements: Extraocular movements intact.     Conjunctiva/sclera: Conjunctivae normal.     Pupils: Pupils are equal, round, and reactive to light.  Cardiovascular:     Rate and Rhythm: Normal rate and regular rhythm.     Pulses: Normal pulses.     Heart sounds: Normal heart sounds. No murmur heard.    No friction rub. No gallop.  Pulmonary:     Effort: Pulmonary effort is normal. No respiratory distress.     Breath sounds: Normal breath sounds. No stridor. No wheezing, rhonchi or rales.  Chest:     Chest wall: No tenderness.  Musculoskeletal:        General: Normal range of motion.     Cervical back: Normal range of motion and neck supple.  Skin:  General: Skin is warm and dry.     Capillary Refill: Capillary refill takes less than 2 seconds.     Coloration: Skin is not jaundiced or pale.     Findings: No bruising, erythema, lesion or rash.  Neurological:     General: No focal deficit present.     Mental Status: She is alert and oriented to person, place, and time. Mental status is at baseline.  Psychiatric:        Mood and Affect: Mood normal.        Behavior: Behavior normal.        Thought Content: Thought content normal.        Judgment: Judgment normal.     Results for orders placed or performed in visit on 06/21/23  ToxASSURE Select 13 (MW), Urine   Collection Time: 06/21/23  3:00 PM  Result Value Ref Range   Summary FINAL       Assessment & Plan:    Problem List Items Addressed This Visit       Digestive   Gastroesophageal reflux disease   Under good control on current regimen. Continue current regimen. Continue to monitor. Call with any concerns.          Endocrine   Hypothyroidism   Rechecking labs today. Await results. Treat as needed.       Diabetes mellitus (HCC)   Doing well with A1c of 5.5. Continue current regimen. Continue to monitor. Call with any concerns.       Relevant Medications   Semaglutide , 1 MG/DOSE, (OZEMPIC , 1 MG/DOSE,) 4 MG/3ML SOPN     Other   Vitamin D  deficiency   Rechecking labs today. Await results. Treat as needed.         Follow up plan: Return in about 6 months (around 01/02/2024) for physical with me. ASAP for AWV with Maureen Sour.

## 2023-07-03 NOTE — Assessment & Plan Note (Signed)
 Rechecking labs today. Await results. Treat as needed.

## 2023-07-03 NOTE — Assessment & Plan Note (Signed)
Under good control on current regimen. Continue current regimen. Continue to monitor. Call with any concerns.   

## 2023-07-03 NOTE — Assessment & Plan Note (Signed)
 Doing well with A1c of 5.5. Continue current regimen. Continue to monitor. Call with any concerns.

## 2023-07-04 LAB — COMPREHENSIVE METABOLIC PANEL WITH GFR
ALT: 13 IU/L (ref 0–32)
AST: 26 IU/L (ref 0–40)
Albumin: 4.2 g/dL (ref 3.9–4.9)
Alkaline Phosphatase: 68 IU/L (ref 44–121)
BUN/Creatinine Ratio: 11 (ref 9–23)
BUN: 9 mg/dL (ref 6–24)
Bilirubin Total: 0.2 mg/dL (ref 0.0–1.2)
CO2: 25 mmol/L (ref 20–29)
Calcium: 9 mg/dL (ref 8.7–10.2)
Chloride: 103 mmol/L (ref 96–106)
Creatinine, Ser: 0.8 mg/dL (ref 0.57–1.00)
Globulin, Total: 3 g/dL (ref 1.5–4.5)
Glucose: 83 mg/dL (ref 70–99)
Potassium: 4.1 mmol/L (ref 3.5–5.2)
Sodium: 140 mmol/L (ref 134–144)
Total Protein: 7.2 g/dL (ref 6.0–8.5)
eGFR: 93 mL/min/{1.73_m2} (ref >59)

## 2023-07-04 LAB — CBC WITH DIFFERENTIAL/PLATELET
Basophils Absolute: 0.1 10*3/uL (ref 0.0–0.2)
Basos: 1 %
EOS (ABSOLUTE): 0.2 10*3/uL (ref 0.0–0.4)
Eos: 2 %
Hematocrit: 41.9 % (ref 34.0–46.6)
Hemoglobin: 13.8 g/dL (ref 11.1–15.9)
Immature Grans (Abs): 0 10*3/uL (ref 0.0–0.1)
Immature Granulocytes: 0 %
Lymphocytes Absolute: 3 10*3/uL (ref 0.7–3.1)
Lymphs: 34 %
MCH: 29 pg (ref 26.6–33.0)
MCHC: 32.9 g/dL (ref 31.5–35.7)
MCV: 88 fL (ref 79–97)
Monocytes Absolute: 0.5 10*3/uL (ref 0.1–0.9)
Monocytes: 6 %
Neutrophils Absolute: 5 10*3/uL (ref 1.4–7.0)
Neutrophils: 57 %
Platelets: 288 10*3/uL (ref 150–450)
RBC: 4.76 x10E6/uL (ref 3.77–5.28)
RDW: 12.9 % (ref 11.7–15.4)
WBC: 8.7 10*3/uL (ref 3.4–10.8)

## 2023-07-04 LAB — TSH: TSH: 4.85 u[IU]/mL — ABNORMAL HIGH (ref 0.450–4.500)

## 2023-07-04 LAB — LIPID PANEL W/O CHOL/HDL RATIO
Cholesterol, Total: 166 mg/dL (ref 100–199)
HDL: 40 mg/dL (ref >39)
LDL Chol Calc (NIH): 92 mg/dL (ref 0–99)
Triglycerides: 197 mg/dL — ABNORMAL HIGH (ref 0–149)
VLDL Cholesterol Cal: 34 mg/dL (ref 5–40)

## 2023-07-04 LAB — VITAMIN D 25 HYDROXY (VIT D DEFICIENCY, FRACTURES): Vit D, 25-Hydroxy: 40.1 ng/mL (ref 30.0–100.0)

## 2023-07-05 ENCOUNTER — Encounter: Payer: Self-pay | Admitting: Family Medicine

## 2023-07-05 ENCOUNTER — Other Ambulatory Visit: Payer: Self-pay

## 2023-07-05 ENCOUNTER — Ambulatory Visit: Payer: Self-pay | Admitting: Family Medicine

## 2023-07-05 ENCOUNTER — Other Ambulatory Visit: Payer: Self-pay | Admitting: Family Medicine

## 2023-07-05 DIAGNOSIS — E039 Hypothyroidism, unspecified: Secondary | ICD-10-CM

## 2023-07-05 MED ORDER — LEVOTHYROXINE SODIUM 75 MCG PO TABS: 75.0000 ug | ORAL_TABLET | Freq: Every day | ORAL | 0 refills | Status: DC

## 2023-07-05 NOTE — Telephone Encounter (Signed)
 Copied from CRM [id number]. Topic: Clinical - Prescription Issue >> Jul 03, 2023  5:33 PM Felizardo Hotter wrote: Reason for CRM: Received call from [facility], [place] - [address] [place] [address] Phone: [phone] Fax: [phone] per Sam regarding SamSemaglutide, 1 MG/DOSE, (OZEMPIC , 1 MG/DOSE,) 4 MG/3ML SOPN, he stated directions are incorrect, please resend correct directions.

## 2023-07-06 MED ORDER — OZEMPIC (1 MG/DOSE) 4 MG/3ML ~~LOC~~ SOPN: 1.0000 mg | PEN_INJECTOR | SUBCUTANEOUS | 1 refills | Status: DC

## 2023-07-06 NOTE — Addendum Note (Signed)
 Addended by: Solomon Dupre on: 07/06/2023 08:13 AM   Modules accepted: Orders

## 2023-07-09 NOTE — Progress Notes (Signed)
 Lab appt scheduled.

## 2023-07-29 DIAGNOSIS — G894 Chronic pain syndrome: Secondary | ICD-10-CM | POA: Diagnosis not present

## 2023-07-29 DIAGNOSIS — R5382 Chronic fatigue, unspecified: Secondary | ICD-10-CM | POA: Diagnosis not present

## 2023-07-29 DIAGNOSIS — M47816 Spondylosis without myelopathy or radiculopathy, lumbar region: Secondary | ICD-10-CM | POA: Diagnosis not present

## 2023-07-31 DIAGNOSIS — G894 Chronic pain syndrome: Secondary | ICD-10-CM | POA: Diagnosis not present

## 2023-07-31 DIAGNOSIS — R5382 Chronic fatigue, unspecified: Secondary | ICD-10-CM | POA: Diagnosis not present

## 2023-07-31 DIAGNOSIS — M47816 Spondylosis without myelopathy or radiculopathy, lumbar region: Secondary | ICD-10-CM | POA: Diagnosis not present

## 2023-08-01 DIAGNOSIS — M47816 Spondylosis without myelopathy or radiculopathy, lumbar region: Secondary | ICD-10-CM | POA: Diagnosis not present

## 2023-08-01 DIAGNOSIS — G894 Chronic pain syndrome: Secondary | ICD-10-CM | POA: Diagnosis not present

## 2023-08-01 DIAGNOSIS — R5382 Chronic fatigue, unspecified: Secondary | ICD-10-CM | POA: Diagnosis not present

## 2023-08-13 ENCOUNTER — Telehealth: Payer: Self-pay | Admitting: Family Medicine

## 2023-08-13 NOTE — Telephone Encounter (Signed)
 Copied from CRM [id number]. Topic: Medicare AWV >> Aug 13, 2023  9:58 AM Juliana Ocean wrote: Reason for CRM: LVM 08/14/2023 to schedule AWV. Please schedule Virtual or Telehealth visits ONLY  Rosalee Collins; Care Guide Ambulatory Clinical Support [place] [facility] Direct Dial: [phone]

## 2023-08-16 ENCOUNTER — Other Ambulatory Visit

## 2023-08-16 DIAGNOSIS — E039 Hypothyroidism, unspecified: Secondary | ICD-10-CM | POA: Diagnosis not present

## 2023-08-17 ENCOUNTER — Ambulatory Visit: Admitting: Family Medicine

## 2023-08-17 ENCOUNTER — Other Ambulatory Visit

## 2023-08-18 LAB — TSH: TSH: 4.4 u[IU]/mL (ref 0.450–4.500)

## 2023-08-20 ENCOUNTER — Ambulatory Visit: Payer: Self-pay | Admitting: Family Medicine

## 2023-08-20 DIAGNOSIS — E039 Hypothyroidism, unspecified: Secondary | ICD-10-CM

## 2023-08-20 MED ORDER — LEVOTHYROXINE SODIUM 88 MCG PO TABS: 88.0000 ug | ORAL_TABLET | Freq: Every day | ORAL | 0 refills | Status: DC

## 2023-08-29 DIAGNOSIS — M47816 Spondylosis without myelopathy or radiculopathy, lumbar region: Secondary | ICD-10-CM | POA: Diagnosis not present

## 2023-08-29 DIAGNOSIS — G894 Chronic pain syndrome: Secondary | ICD-10-CM | POA: Diagnosis not present

## 2023-08-29 DIAGNOSIS — R5382 Chronic fatigue, unspecified: Secondary | ICD-10-CM | POA: Diagnosis not present

## 2023-08-31 DIAGNOSIS — G894 Chronic pain syndrome: Secondary | ICD-10-CM | POA: Diagnosis not present

## 2023-08-31 DIAGNOSIS — M47816 Spondylosis without myelopathy or radiculopathy, lumbar region: Secondary | ICD-10-CM | POA: Diagnosis not present

## 2023-08-31 DIAGNOSIS — R5382 Chronic fatigue, unspecified: Secondary | ICD-10-CM | POA: Diagnosis not present

## 2023-08-31 NOTE — Progress Notes (Signed)
 Called patient left message for patient to call office and schedule lab appt

## 2023-09-01 DIAGNOSIS — G894 Chronic pain syndrome: Secondary | ICD-10-CM | POA: Diagnosis not present

## 2023-09-01 DIAGNOSIS — M47816 Spondylosis without myelopathy or radiculopathy, lumbar region: Secondary | ICD-10-CM | POA: Diagnosis not present

## 2023-09-01 DIAGNOSIS — R5382 Chronic fatigue, unspecified: Secondary | ICD-10-CM | POA: Diagnosis not present

## 2023-09-12 ENCOUNTER — Encounter: Payer: Self-pay | Admitting: Family Medicine

## 2023-09-12 ENCOUNTER — Ambulatory Visit (INDEPENDENT_AMBULATORY_CARE_PROVIDER_SITE_OTHER): Admitting: Family Medicine

## 2023-09-12 VITALS — Ht 61.0 in | Wt 167.4 lb

## 2023-09-12 DIAGNOSIS — M9906 Segmental and somatic dysfunction of lower extremity: Secondary | ICD-10-CM | POA: Diagnosis not present

## 2023-09-12 DIAGNOSIS — M9904 Segmental and somatic dysfunction of sacral region: Secondary | ICD-10-CM

## 2023-09-12 DIAGNOSIS — M9905 Segmental and somatic dysfunction of pelvic region: Secondary | ICD-10-CM

## 2023-09-12 DIAGNOSIS — M9908 Segmental and somatic dysfunction of rib cage: Secondary | ICD-10-CM | POA: Diagnosis not present

## 2023-09-12 DIAGNOSIS — M9902 Segmental and somatic dysfunction of thoracic region: Secondary | ICD-10-CM | POA: Diagnosis not present

## 2023-09-12 DIAGNOSIS — M5431 Sciatica, right side: Secondary | ICD-10-CM | POA: Diagnosis not present

## 2023-09-12 DIAGNOSIS — M9903 Segmental and somatic dysfunction of lumbar region: Secondary | ICD-10-CM

## 2023-09-12 DIAGNOSIS — M9900 Segmental and somatic dysfunction of head region: Secondary | ICD-10-CM

## 2023-09-12 DIAGNOSIS — M9909 Segmental and somatic dysfunction of abdomen and other regions: Secondary | ICD-10-CM | POA: Diagnosis not present

## 2023-09-12 DIAGNOSIS — M9901 Segmental and somatic dysfunction of cervical region: Secondary | ICD-10-CM

## 2023-09-12 NOTE — Progress Notes (Signed)
 Ht 5' 1 (1.549 m)   Wt 167 lb 6.4 oz (75.9 kg)   SpO2 98%   BMI 31.63 kg/m    Subjective:    Patient ID: Sharon Rodriguez, female    DOB: [birth year], 43 y.o.   MRN: [id number]  HPI: Sharon Rodriguez is a 43 y.o. female  Chief Complaint  Patient presents with   chronic low back pain   Sharon Rodriguez presents today for evaluation and possible treatment with OMT for low back pain. She notes that her low back has been acting up for the past few weeks. It's located in her R buttock. It's tender to touch and sore. Pain is not radiating into her legs. Pain is better with OMT and pain medicine. Its worse with more activity. She notes that she has been lifting more. She has otherwise been doing well with no other concerns or complaints at this time.   Relevant past medical, surgical, family and social history reviewed and updated as indicated. Interim medical history since our last visit reviewed. Allergies and medications reviewed and updated.  Review of Systems  Constitutional: Negative.   Respiratory: Negative.    Cardiovascular: Negative.   Musculoskeletal:  Positive for arthralgias, back pain, myalgias, neck pain and neck stiffness. Negative for gait problem and joint swelling.  Skin: Negative.   Neurological: Negative.   Psychiatric/Behavioral: Negative.      Per HPI unless specifically indicated above     Objective:    Ht 5' 1 (1.549 m)   Wt 167 lb 6.4 oz (75.9 kg)   SpO2 98%   BMI 31.63 kg/m   Wt Readings from Last 3 Encounters:  09/12/23 167 lb 6.4 oz (75.9 kg)  07/03/23 168 lb (76.2 kg)  05/21/23 164 lb (74.4 kg)    Physical Exam Vitals and nursing note reviewed.  Constitutional:      General: She is not in acute distress.    Appearance: Normal appearance. She is not ill-appearing.  HENT:     Head: Normocephalic and atraumatic.     Right Ear: External ear normal.     Left Ear: External ear normal.     Nose: Nose normal.     Mouth/Throat:     Mouth: Mucous  membranes are moist.     Pharynx: Oropharynx is clear.  Eyes:     Extraocular Movements: Extraocular movements intact.     Conjunctiva/sclera: Conjunctivae normal.     Pupils: Pupils are equal, round, and reactive to light.  Neck:     Vascular: No carotid bruit.  Cardiovascular:     Rate and Rhythm: Normal rate.     Pulses: Normal pulses.  Pulmonary:     Effort: Pulmonary effort is normal. No respiratory distress.  Abdominal:     General: Abdomen is flat. There is no distension.     Palpations: Abdomen is soft. There is no mass.     Tenderness: There is no abdominal tenderness. There is no right CVA tenderness, left CVA tenderness, guarding or rebound.     Hernia: No hernia is present.  Musculoskeletal:     Cervical back: No muscular tenderness.  Lymphadenopathy:     Cervical: No cervical adenopathy.  Skin:    General: Skin is warm and dry.     Capillary Refill: Capillary refill takes less than 2 seconds.     Coloration: Skin is not jaundiced or pale.     Findings: No bruising, erythema, lesion or rash.  Neurological:  General: No focal deficit present.     Mental Status: She is alert. Mental status is at baseline.  Psychiatric:        Mood and Affect: Mood normal.        Behavior: Behavior normal.        Thought Content: Thought content normal.        Judgment: Judgment normal.   Musculoskeletal:  Exam found Decreased ROM, Tissue texture changes, Tenderness to palpation, and Asymmetry of patient's  head, neck, thorax, ribs, lumbar, pelvis, sacrum, lower extremity, and abdomen Osteopathic Structural Exam:   Head: OAESSR, hypertonic suboccipital muscles  Neck: C4ESRR, SCM hypertonic on the R  Thorax: T4-6SLRR, trap hypertonic bilaterally L>R  Ribs: Ribs 5-7 locked up on the L, Rib 6 locked up on the L  Lumbar: QL hypertonic on the R, L3-5SLRR  Pelvis: Anterior R innominate, SI joint restricted on the R  Sacrum: R on R torsion  Lower Extremity: IT band hypertonic on the  R  Abdomen: diaphragm hypertonic bilaterally R>L   Results for orders placed or performed in visit on 08/16/23  TSH   Collection Time: 08/16/23  1:38 PM  Result Value Ref Range   TSH 4.400 0.450 - 4.500 uIU/mL      Assessment & Plan:   Problem List Items Addressed This Visit   None Visit Diagnoses       Right sided sciatica    -  Primary   In exacerbation. She does have somatic dysfunction that's contributing to symptoms. Treated today with fair results as below. Will refer to PT. Call w concerns.   Relevant Orders   Ambulatory referral to Physical Therapy     Somatic dysfunction of lumbar region         Thoracic segment dysfunction         Segmental dysfunction of rib cage         Segmental dysfunction of abdomen         Somatic dysfunction of sacral region         Somatic dysfunction of pelvis region         Cervical segment dysfunction         Head region somatic dysfunction         Somatic dysfunction of lower extremities          After verbal consent was obtained, patient was treated today with osteopathic manipulative medicine to the regions of the head, neck, thorax, ribs, lumbar, pelvis, sacrum, abdomen, and lower extremity using the techniques of cranial, myofascial release, HVLA, and soft tissue. Areas of compensation relating to her primary pain source also treated. Patient tolerated the procedure well with good objective and good subjective improvement in symptoms. She left the room in good condition. She was advised to stay well hydrated and that she may have some soreness following the procedure. If not improving or worsening, she will call and come in. She will return for reevaluation  on a PRN basis.   Follow up plan: Return if symptoms worsen or fail to improve.

## 2023-09-17 ENCOUNTER — Other Ambulatory Visit: Payer: Self-pay | Admitting: Family Medicine

## 2023-09-18 ENCOUNTER — Encounter: Payer: Self-pay | Admitting: Nurse Practitioner

## 2023-09-18 ENCOUNTER — Ambulatory Visit: Attending: Nurse Practitioner | Admitting: Nurse Practitioner

## 2023-09-18 VITALS — BP 100/42 | HR 92 | Temp 98.2°F | Ht 61.0 in | Wt 176.0 lb

## 2023-09-18 DIAGNOSIS — Z79891 Long term (current) use of opiate analgesic: Secondary | ICD-10-CM | POA: Insufficient documentation

## 2023-09-18 DIAGNOSIS — M797 Fibromyalgia: Secondary | ICD-10-CM | POA: Diagnosis not present

## 2023-09-18 DIAGNOSIS — M26629 Arthralgia of temporomandibular joint, unspecified side: Secondary | ICD-10-CM | POA: Insufficient documentation

## 2023-09-18 DIAGNOSIS — M47816 Spondylosis without myelopathy or radiculopathy, lumbar region: Secondary | ICD-10-CM | POA: Insufficient documentation

## 2023-09-18 DIAGNOSIS — R5382 Chronic fatigue, unspecified: Secondary | ICD-10-CM | POA: Diagnosis not present

## 2023-09-18 DIAGNOSIS — G894 Chronic pain syndrome: Secondary | ICD-10-CM | POA: Insufficient documentation

## 2023-09-18 DIAGNOSIS — Z79899 Other long term (current) drug therapy: Secondary | ICD-10-CM | POA: Diagnosis not present

## 2023-09-18 MED ORDER — OXYCODONE-ACETAMINOPHEN 5-325 MG PO TABS: 1.0000 | ORAL_TABLET | Freq: Four times a day (QID) | ORAL | 0 refills | Status: DC | PRN

## 2023-09-18 MED ORDER — OXYCODONE-ACETAMINOPHEN 5-325 MG PO TABS: 1.0000 | ORAL_TABLET | Freq: Four times a day (QID) | ORAL | 0 refills | Status: AC | PRN

## 2023-09-18 MED ORDER — OXYCODONE-ACETAMINOPHEN 5-325 MG PO TABS
1.0000 | ORAL_TABLET | Freq: Four times a day (QID) | ORAL | 0 refills | Status: DC | PRN
Start: 2023-11-25 — End: 2023-09-18

## 2023-09-18 NOTE — Progress Notes (Signed)
 Nursing Pain Medication Assessment:  Safety precautions to be maintained throughout the outpatient stay will include: orient to surroundings, keep bed in low position, maintain call bell within reach at all times, provide assistance with transfer out of bed and ambulation.  Medication Inspection Compliance: Pill count conducted under aseptic conditions, in front of the patient. Neither the pills nor the bottle was removed from the patient's sight at any time. Once count was completed pills were immediately returned to the patient in their original bottle.  Medication: Oxycodone /APAP Pill/Patch Count: 30 of 120 pills/patches remain Pill/Patch Appearance: Markings consistent with prescribed medication Bottle Appearance: Standard pharmacy container. Clearly labeled. Filled Date: 06/ / 16 / 2025 Last Medication intake:  Today

## 2023-09-18 NOTE — Progress Notes (Signed)
 PROVIDER NOTE: Interpretation of information contained herein should be left to medically-trained personnel. Specific patient instructions are provided elsewhere under Patient Instructions section of medical record. This document was created in part using AI and STT-dictation technology, any transcriptional errors that may result from this process are unintentional.  Patient: Sharon Rodriguez  Service: E/M   PCP: Vicci Duwaine SQUIBB, DO  DOB: 1981-02-12  DOS: 09/18/2023  Provider: Emmy MARLA Blanch, NP  MRN: [id number]  Delivery: Face-to-face  Specialty: Interventional Pain Management  Type: Established Patient  Setting: Ambulatory outpatient facility  Specialty designation: 09  Referring Prov.: Vicci Duwaine SQUIBB, DO  Location: Outpatient office facility       History of present illness (HPI) Sharon Rodriguez, a 43 y.o. year old female, is here today because of her Chronic pain syndrome [G89.4]. Sharon Rodriguez primary complain today is Back Pain  Pertinent problems: Sharon Rodriguez has Hypothyroidism; Rheumatoid arthritis (HCC); Depression; Fibromyalgia; TMJ pain dysfunction syndrome; H/O juvenile rheumatoid arthritis; Spondylosis without myelopathy or radiculopathy, lumbar region; Chronic bilateral low back pain without sciatica; Long-term current use of opioid analgesic, and Chronic pain syndrome on their pertinent problem list.  Pain Assessment: Severity of Chronic pain is reported as a 7 /10. Location: Back Lower/Denies. Onset: More than a month ago. Quality: Aching, Throbbing. Timing: Intermittent. Modifying factor(s): Meds, heat, ice, rest. Vitals:  height is 5' 1 (1.549 m) and weight is 176 lb (79.8 kg). Her temperature is 98.2 F (36.8 C). Her blood pressure is 100/42 (abnormal) and her pulse is 92. Her oxygen saturation is 100%.  BMI: Estimated body mass index is 33.25 kg/m as calculated from the following:   Height as of this encounter: 5' 1 (1.549 m).   Weight as of this encounter: 176 lb (79.8  kg).  Last encounter: 06/21/2023 Last procedure: Visit date not found.  Reason for encounter: medication management. No change in medical history since last visit.  Patient's pain is at baseline.  Patient continues multimodal pain regimen as prescribed.  States that it provides pain relief and improvement in functional status.   Pharmacotherapy Assessment   Oxycodone -acetaminophen  (Percocet) 5-325 mg tablet every 6 hours as needed for severe pain. MME = 30  Monitoring: [place] PMP: PDMP reviewed during this encounter.       Pharmacotherapy: No side-effects or adverse reactions reported. Compliance: No problems identified. Effectiveness: Clinically acceptable.  Erlene Doyal SAUNDERS, [place]  09/18/2023 10:41 AM  Sign when Signing Visit Nursing Pain Medication Assessment:  Safety precautions to be maintained throughout the outpatient stay will include: orient to surroundings, keep bed in low position, maintain call bell within reach at all times, provide assistance with transfer out of bed and ambulation.  Medication Inspection Compliance: Pill count conducted under aseptic conditions, in front of the patient. Neither the pills nor the bottle was removed from the patient's sight at any time. Once count was completed pills were immediately returned to the patient in their original bottle.  Medication: Oxycodone /APAP Pill/Patch Count: 30 of 120 pills/patches remain Pill/Patch Appearance: Markings consistent with prescribed medication Bottle Appearance: Standard pharmacy container. Clearly labeled. Filled Date: 06/ / 16 / 2025 Last Medication intake:  Today    UDS:  Summary  Date Value Ref Range Status  06/21/2023 FINAL  Final    Comment:    ==================================================================== [employer] [facility] (MW) ==================================================================== Test  Result       Flag       Units  Drug Present and Declared for  Prescription Verification   Oxycodone                       1965         EXPECTED   ng/mg creat   Oxymorphone                    475          EXPECTED   ng/mg creat   Noroxycodone                   2360         EXPECTED   ng/mg creat   Noroxymorphone                 194          EXPECTED   ng/mg creat    Sources of oxycodone  are scheduled prescription medications.    Oxymorphone, noroxycodone, and noroxymorphone are expected    metabolites of oxycodone . Oxymorphone is also available as a    scheduled prescription medication.  Drug Absent but Declared for Prescription Verification   Amphetamine                    Not Detected UNEXPECTED ng/mg creat ==================================================================== Test                      Result    Flag   Units      Ref Range   Creatinine              151              mg/dL      >=79 ==================================================================== Declared Medications:  The flagging and interpretation on this report are based on the  following declared medications.  Unexpected results may arise from  inaccuracies in the declared medications.   **Note: The testing scope of this panel includes these medications:   Amphetamine (Adderall)  Oxycodone  (Percocet)   **Note: The testing scope of this panel does not include the  following reported medications:   Acetaminophen  (Percocet)  Albuterol  (Ventolin  HFA)  Epinephrine  (EpiPen )  Levothyroxine  (Synthroid )  Ondansetron  (Zofran )  Semaglutide  (Ozempic )  Vitamin D2 (Drisdol ) ==================================================================== For clinical consultation, please call [phone]. ====================================================================     No results found for: CBDTHCR No results found for: D8THCCBX No results found for: D9THCCBX  ROS  Constitutional: Denies any fever or chills Gastrointestinal: No reported hemesis, hematochezia,  vomiting, or acute GI distress Musculoskeletal: Denies any acute onset joint swelling, redness, loss of ROM, or weakness Neurological: No reported episodes of acute onset apraxia, aphasia, dysarthria, agnosia, amnesia, paralysis, loss of coordination, or loss of consciousness  Medication Review  EPINEPHrine , FreeStyle Libre 3 Sensor, ONE TOUCH ULTRA 2, Pen Needles, Semaglutide  (1 MG/DOSE), Vitamin D  (Ergocalciferol ), albuterol , amphetamine-dextroamphetamine, glucose blood, levothyroxine , ondansetron , onetouch ultrasoft, and oxyCODONE -acetaminophen   History Review  Allergy: Sharon Rodriguez is allergic to ibuprofen, metformin  and related, and promethazine. Drug: Sharon Rodriguez  reports no history of drug use. Alcohol:  reports no history of alcohol use. Tobacco:  reports that she has never smoked. She has never used smokeless tobacco. Social: Sharon Rodriguez  reports that she has never smoked. She has never used smokeless tobacco. She reports that she does not drink alcohol and does not use drugs. Medical:  has a past  medical history of ADD (attention deficit disorder), Anxiety, Asthma, Biliary dyskinesia (12/27/2017), Chronic bronchitis (HCC), Chronic fatigue, Chronic pain, Depression, Diabetes mellitus without complication (HCC), Fibromyalgia, GERD (gastroesophageal reflux disease), History of stomach ulcers (2019), Hypothyroidism (01/27/2015), Learning disability, Osteoarthritis, Rheumatoid arthritis (HCC), Thyroid  disease, and TMJ (dislocation of temporomandibular joint). Surgical: Sharon Rodriguez  has a past surgical history that includes Cesarean section; Esophagogastroduodenoscopy (egd) with propofol  (N/A, 12/07/2017); and Cholecystectomy (N/A, 01/14/2018). Family: family history includes ADD / ADHD in her daughter and mother; Allergies in her daughter and daughter; Arthritis in her brother and paternal grandmother; COPD in her mother; Cancer in her maternal grandmother; Dementia in her paternal grandmother; Diabetes in  her father and mother; Heart attack in her maternal grandfather; Heart disease in her maternal grandfather; Hyperlipidemia in her father; Learning disabilities in her mother; Mental illness in her daughter, father, and sister; Stroke in her maternal grandfather; Thyroid  disease in her daughter.  Laboratory Chemistry Profile   Renal Lab Results  Component Value Date   BUN 9 07/03/2023   CREATININE 0.80 07/03/2023   BCR 11 07/03/2023   GFRAA 94 04/16/2020   GFRNONAA 82 04/16/2020    Hepatic Lab Results  Component Value Date   AST 26 07/03/2023   ALT 13 07/03/2023   ALBUMIN 4.2 07/03/2023   ALKPHOS 68 07/03/2023   LIPASE 43 04/10/2017    Electrolytes Lab Results  Component Value Date   NA 140 07/03/2023   K 4.1 07/03/2023   CL 103 07/03/2023   CALCIUM 9.0 07/03/2023   MG 2.2 07/29/2020    Bone Lab Results  Component Value Date   VD25OH 40.1 07/03/2023    Inflammation (CRP: Acute Phase) (ESR: Chronic Phase) No results found for: CRP, ESRSEDRATE, LATICACIDVEN       Note: Above Lab results reviewed.  Recent Imaging Review  LONG TERM MONITOR (3-14 DAYS) HR 62 - 167, average 90 bpm. Rare supraventricular and ventricular ectopy. No atrial fibrillation. No sustained arrhythmias.  Sharon T. Cindie, MD, [facility], [facility] Cardiac Electrophysiology Note: Reviewed        Physical Exam  Vitals: BP (!) 100/42   Pulse 92   Temp 98.2 F (36.8 C)   Ht 5' 1 (1.549 m)   Wt 176 lb (79.8 kg)   SpO2 100%   BMI 33.25 kg/m  BMI: Estimated body mass index is 33.25 kg/m as calculated from the following:   Height as of this encounter: 5' 1 (1.549 m).   Weight as of this encounter: 176 lb (79.8 kg). Ideal: Ideal body weight: 47.8 kg (105 lb 6.1 oz) Adjusted ideal body weight: 60.6 kg (133 lb 10 oz) General appearance: Well nourished, well developed, and well hydrated. In no apparent acute distress Mental status: Alert, oriented x 3 (person, place, & time)       Respiratory:  No evidence of acute respiratory distress Eyes: PERLA   Assessment   Diagnosis Status  1. Chronic pain syndrome   2. Spondylosis without myelopathy or radiculopathy, lumbar region   3. Medication management   4. Long term current use of opiate analgesic   5. Fibromyalgia   6. TMJ pain dysfunction syndrome   7. Chronic fatigue    Controlled Controlled Controlled   Updated Problems: No problems updated.  Plan of Care  Problem-specific:  Assessment and Plan We will continue on current medication regimen.  Prescribing drug monitoring (PDMP) reviewed; findings consistent with the use of prescribed medication and no evidence of narcotic misuse or abuse.  No  other new issues or problems reported to this visit.  Schedule follow-up in 90 days for medication management.   Ms. TIEASHA LARSEN has a current medication list which includes the following long-term medication(s): albuterol  and levothyroxine .  Pharmacotherapy (Medications Ordered): Meds ordered this encounter  Medications   oxyCODONE -acetaminophen  (PERCOCET) 5-325 MG tablet    Sig: Take 1 tablet by mouth every 6 (six) hours as needed for severe pain (pain score 7-10). Must last 30 days.    Dispense:  120 tablet    Refill:  0    [place] STOP ACT - Not applicable. Fill one day early if pharmacy is closed on scheduled refill date.   oxyCODONE -acetaminophen  (PERCOCET) 5-325 MG tablet    Sig: Take 1 tablet by mouth every 6 (six) hours as needed for severe pain (pain score 7-10). Must last 30 days.    Dispense:  120 tablet    Refill:  0    [place] STOP ACT - Not applicable. Fill one day early if pharmacy is closed on scheduled refill date.   DISCONTD: oxyCODONE -acetaminophen  (PERCOCET) 5-325 MG tablet    Sig: Take 1 tablet by mouth every 6 (six) hours as needed for severe pain (pain score 7-10). Must last 30 days.    Dispense:  120 tablet    Refill:  0    [place] STOP ACT - Not applicable. Fill one day early if pharmacy is closed on  scheduled refill date.   oxyCODONE -acetaminophen  (PERCOCET) 5-325 MG tablet    Sig: Take 1 tablet by mouth every 6 (six) hours as needed for severe pain (pain score 7-10). Must last 30 days.    Dispense:  120 tablet    Refill:  0    [place] STOP ACT - Not applicable. Fill one day early if pharmacy is closed on scheduled refill date.   Orders:  No orders of the defined types were placed in this encounter.       Return in about 3 months (around 12/19/2023) for (F2F), (MM), Emmy Blanch NP.    Recent Visits Date Type Provider Dept  06/21/23 Office Visit Marcelino Nurse, MD [facility]  Showing recent visits within past 90 days and meeting all other requirements Today's Visits Date Type Provider Dept  09/18/23 Office Visit Nelani Schmelzle K, NP [facility]  Showing today's visits and meeting all other requirements Future Appointments No visits were found meeting these conditions. Showing future appointments within next 90 days and meeting all other requirements  I discussed the assessment and treatment plan with the patient. The patient was provided an opportunity to ask questions and all were answered. The patient agreed with the plan and demonstrated an understanding of the instructions.  Patient advised to call back or seek an in-person evaluation if the symptoms or condition worsens.  Duration of encounter: 30 minutes.  Total time on encounter, as per AMA guidelines included both the face-to-face and non-face-to-face time personally spent by the physician and/or other qualified health care professional(s) on the day of the encounter (includes time in activities that require the physician or other qualified health care professional and does not include time in activities normally performed by clinical staff). Physician's time may include the following activities when performed: Preparing to see the patient (e.g., pre-charting review of records, searching for previously ordered  imaging, lab work, and nerve conduction tests) Review of prior analgesic pharmacotherapies. Reviewing PMP Interpreting ordered tests (e.g., lab work, imaging, nerve conduction tests) Performing post-procedure evaluations, including interpretation of  diagnostic procedures Obtaining and/or reviewing separately obtained history Performing a medically appropriate examination and/or evaluation Counseling and educating the patient/family/caregiver Ordering medications, tests, or procedures Referring and communicating with other health care professionals (when not separately reported) Documenting clinical information in the electronic or other health record Independently interpreting results (not separately reported) and communicating results to the patient/ family/caregiver Care coordination (not separately reported)  Note by: Amijah Timothy K Deckard Stuber, NP (TTS and AI technology used. I apologize for any typographical errors that were not detected and corrected.) Date: 09/18/2023; Time: 11:29 AM

## 2023-09-19 NOTE — Telephone Encounter (Signed)
 No longer current dose of this medication Requested Prescriptions  Pending Prescriptions Disp Refills   OZEMPIC , 1 MG/DOSE, 4 MG/3ML SOPN [Pharmacy Med Name: OZEMPIC  (1 MG/DOSE) 4 MG/3ML SUBQ S] 3 mL     Sig: INJECT 0.375MLS (0.5MG  TOTAL) SUBCUTANEOUSLY ONCE A WEEK     Endocrinology:  Diabetes - GLP-1 Receptor Agonists - semaglutide  Passed - 09/19/2023  4:25 PM      Passed - HBA1C in normal range and within 180 days    Hemoglobin A1C  Date Value Ref Range Status  07/08/2019 7.6  Final   HB A1C (BAYER DCA - WAIVED)  Date Value Ref Range Status  07/03/2023 5.5 4.8 - 5.6 % Final    Comment:             Prediabetes: 5.7 - 6.4          Diabetes: >6.4          Glycemic control for adults with diabetes: <7.0          Passed - Cr in normal range and within 360 days    Creatinine  Date Value Ref Range Status  03/20/2013 0.86 0.60 - 1.30 mg/dL Final   Creatinine, Ser  Date Value Ref Range Status  07/03/2023 0.80 0.57 - 1.00 mg/dL Final         Passed - Valid encounter within last 6 months    Recent Outpatient Visits           1 week ago Right sided sciatica   [facility] [place], Megan P, DO   2 months ago Vitamin D  deficiency   [facility] [place], Megan P, DO   4 months ago Chronic bilateral low back pain without sciatica   [place] [facility] [place], Megan P, DO   5 months ago Chronic bilateral low back pain without sciatica    [facility] [place], Megan P, DO

## 2023-09-24 ENCOUNTER — Ambulatory Visit (INDEPENDENT_AMBULATORY_CARE_PROVIDER_SITE_OTHER): Admitting: Family Medicine

## 2023-09-24 VITALS — BP 104/64 | HR 97 | Ht 61.0 in | Wt 170.0 lb

## 2023-09-24 DIAGNOSIS — M9900 Segmental and somatic dysfunction of head region: Secondary | ICD-10-CM

## 2023-09-24 DIAGNOSIS — M9901 Segmental and somatic dysfunction of cervical region: Secondary | ICD-10-CM | POA: Diagnosis not present

## 2023-09-24 DIAGNOSIS — M545 Low back pain, unspecified: Secondary | ICD-10-CM | POA: Diagnosis not present

## 2023-09-24 DIAGNOSIS — M9908 Segmental and somatic dysfunction of rib cage: Secondary | ICD-10-CM

## 2023-09-24 DIAGNOSIS — M9904 Segmental and somatic dysfunction of sacral region: Secondary | ICD-10-CM | POA: Diagnosis not present

## 2023-09-24 DIAGNOSIS — M9909 Segmental and somatic dysfunction of abdomen and other regions: Secondary | ICD-10-CM | POA: Diagnosis not present

## 2023-09-24 DIAGNOSIS — M9905 Segmental and somatic dysfunction of pelvic region: Secondary | ICD-10-CM | POA: Diagnosis not present

## 2023-09-24 DIAGNOSIS — M9902 Segmental and somatic dysfunction of thoracic region: Secondary | ICD-10-CM | POA: Diagnosis not present

## 2023-09-24 DIAGNOSIS — G8929 Other chronic pain: Secondary | ICD-10-CM

## 2023-09-24 DIAGNOSIS — M9903 Segmental and somatic dysfunction of lumbar region: Secondary | ICD-10-CM | POA: Diagnosis not present

## 2023-09-24 MED ORDER — LEVOTHYROXINE SODIUM 88 MCG PO TABS: 75.0000 ug | ORAL_TABLET | Freq: Every day | ORAL | Status: DC

## 2023-09-24 NOTE — Progress Notes (Unsigned)
   BP 104/64   Pulse 97   SpO2 97%    Subjective:    Patient ID: Sharon Rodriguez, female    DOB: 1981-01-22, 43 y.o.   MRN: [id number]  HPI: Sharon Rodriguez is a 43 y.o. female  No chief complaint on file.  Did well after last time. Has been doing a lot of walking. Having a lot of pain in her low back  Relevant past medical, surgical, family and social history reviewed and updated as indicated. Interim medical history since our last visit reviewed. Allergies and medications reviewed and updated.  Review of Systems  Per HPI unless specifically indicated above     Objective:    BP 104/64   Pulse 97   SpO2 97%   Wt Readings from Last 3 Encounters:  09/18/23 176 lb (79.8 kg)  09/12/23 167 lb 6.4 oz (75.9 kg)  07/03/23 168 lb (76.2 kg)    Physical Exam  Results for orders placed or performed in visit on 08/16/23  TSH   Collection Time: 08/16/23  1:38 PM  Result Value Ref Range   TSH 4.400 0.450 - 4.500 uIU/mL      Assessment & Plan:   Problem List Items Addressed This Visit   None    Follow up plan: No follow-ups on file.

## 2023-09-25 ENCOUNTER — Encounter: Payer: Self-pay | Admitting: Family Medicine

## 2023-09-26 NOTE — Assessment & Plan Note (Signed)
She does have somatic dysfunction that is contributing to her symptoms. Treated today with good results as below. Call with any concerns. Continue to monitor.  

## 2023-09-28 DIAGNOSIS — M47816 Spondylosis without myelopathy or radiculopathy, lumbar region: Secondary | ICD-10-CM | POA: Diagnosis not present

## 2023-09-28 DIAGNOSIS — G894 Chronic pain syndrome: Secondary | ICD-10-CM | POA: Diagnosis not present

## 2023-09-28 DIAGNOSIS — R5382 Chronic fatigue, unspecified: Secondary | ICD-10-CM | POA: Diagnosis not present

## 2023-09-30 DIAGNOSIS — R5382 Chronic fatigue, unspecified: Secondary | ICD-10-CM | POA: Diagnosis not present

## 2023-09-30 DIAGNOSIS — M47816 Spondylosis without myelopathy or radiculopathy, lumbar region: Secondary | ICD-10-CM | POA: Diagnosis not present

## 2023-09-30 DIAGNOSIS — G894 Chronic pain syndrome: Secondary | ICD-10-CM | POA: Diagnosis not present

## 2023-10-01 DIAGNOSIS — G894 Chronic pain syndrome: Secondary | ICD-10-CM | POA: Diagnosis not present

## 2023-10-01 DIAGNOSIS — M47816 Spondylosis without myelopathy or radiculopathy, lumbar region: Secondary | ICD-10-CM | POA: Diagnosis not present

## 2023-10-01 DIAGNOSIS — R5382 Chronic fatigue, unspecified: Secondary | ICD-10-CM | POA: Diagnosis not present

## 2023-10-17 ENCOUNTER — Other Ambulatory Visit

## 2023-10-29 DIAGNOSIS — M47816 Spondylosis without myelopathy or radiculopathy, lumbar region: Secondary | ICD-10-CM | POA: Diagnosis not present

## 2023-10-29 DIAGNOSIS — G894 Chronic pain syndrome: Secondary | ICD-10-CM | POA: Diagnosis not present

## 2023-10-29 DIAGNOSIS — R5382 Chronic fatigue, unspecified: Secondary | ICD-10-CM | POA: Diagnosis not present

## 2023-10-31 DIAGNOSIS — M47816 Spondylosis without myelopathy or radiculopathy, lumbar region: Secondary | ICD-10-CM | POA: Diagnosis not present

## 2023-10-31 DIAGNOSIS — G894 Chronic pain syndrome: Secondary | ICD-10-CM | POA: Diagnosis not present

## 2023-10-31 DIAGNOSIS — R5382 Chronic fatigue, unspecified: Secondary | ICD-10-CM | POA: Diagnosis not present

## 2023-11-06 ENCOUNTER — Other Ambulatory Visit

## 2023-11-06 ENCOUNTER — Ambulatory Visit: Admitting: Family Medicine

## 2023-11-06 DIAGNOSIS — E039 Hypothyroidism, unspecified: Secondary | ICD-10-CM | POA: Diagnosis not present

## 2023-11-07 ENCOUNTER — Encounter: Payer: Self-pay | Admitting: Family Medicine

## 2023-11-07 ENCOUNTER — Ambulatory Visit: Payer: Self-pay | Admitting: Family Medicine

## 2023-11-07 LAB — TSH: TSH: 3.8 u[IU]/mL (ref 0.450–4.500)

## 2023-11-07 MED ORDER — LEVOTHYROXINE SODIUM 88 MCG PO TABS: 88.0000 ug | ORAL_TABLET | Freq: Every day | ORAL | 3 refills | Status: DC

## 2023-11-08 ENCOUNTER — Other Ambulatory Visit: Payer: Self-pay | Admitting: Family Medicine

## 2023-11-08 MED ORDER — LEVOTHYROXINE SODIUM 75 MCG PO TABS: 75.0000 ug | ORAL_TABLET | Freq: Every day | ORAL | 3 refills | Status: AC

## 2023-11-09 ENCOUNTER — Other Ambulatory Visit

## 2023-11-09 ENCOUNTER — Ambulatory Visit (INDEPENDENT_AMBULATORY_CARE_PROVIDER_SITE_OTHER): Admitting: Family Medicine

## 2023-11-09 VITALS — BP 121/75 | HR 86 | Ht 61.0 in

## 2023-11-09 DIAGNOSIS — M9909 Segmental and somatic dysfunction of abdomen and other regions: Secondary | ICD-10-CM

## 2023-11-09 DIAGNOSIS — G8929 Other chronic pain: Secondary | ICD-10-CM | POA: Diagnosis not present

## 2023-11-09 DIAGNOSIS — M9905 Segmental and somatic dysfunction of pelvic region: Secondary | ICD-10-CM

## 2023-11-09 DIAGNOSIS — M9902 Segmental and somatic dysfunction of thoracic region: Secondary | ICD-10-CM

## 2023-11-09 DIAGNOSIS — M9904 Segmental and somatic dysfunction of sacral region: Secondary | ICD-10-CM

## 2023-11-09 DIAGNOSIS — M545 Low back pain, unspecified: Secondary | ICD-10-CM | POA: Diagnosis not present

## 2023-11-09 DIAGNOSIS — M9903 Segmental and somatic dysfunction of lumbar region: Secondary | ICD-10-CM

## 2023-11-09 DIAGNOSIS — M9908 Segmental and somatic dysfunction of rib cage: Secondary | ICD-10-CM | POA: Diagnosis not present

## 2023-11-09 NOTE — Progress Notes (Signed)
 BP 121/75   Pulse 86   Ht 5' 1 (1.549 m)   SpO2 98%   BMI 32.12 kg/m    Subjective:    Patient ID: Sharon Rodriguez, female    DOB: [birth year], 43 y.o.   MRN: [id number]  HPI: Sharon Rodriguez is a 43 y.o. female  Chief Complaint  Patient presents with   Back Pain   [name] presents today for evaluation and possible treatment with OMT for low back pain. She notes that she has been in pain for about a week. She has been having a sharp pain in her pelvis on the R side, better with laying down with a heating pad, worse with walking. She doesn't remember doing anything that would have caused this pain. She has otherwise been feeling well with no other concerns or complaints at this time.   Relevant past medical, surgical, family and social history reviewed and updated as indicated. Interim medical history since our last visit reviewed. Allergies and medications reviewed and updated.  Review of Systems  Constitutional: Negative.   Respiratory: Negative.    Cardiovascular: Negative.   Gastrointestinal: Negative.   Musculoskeletal:  Positive for back pain and myalgias. Negative for arthralgias, gait problem, joint swelling, neck pain and neck stiffness.  Skin: Negative.   Neurological: Negative.   Psychiatric/Behavioral: Negative.      Per HPI unless specifically indicated above     Objective:    BP 121/75   Pulse 86   Ht 5' 1 (1.549 m)   SpO2 98%   BMI 32.12 kg/m   Wt Readings from Last 3 Encounters:  09/24/23 170 lb (77.1 kg)  09/18/23 176 lb (79.8 kg)  09/12/23 167 lb 6.4 oz (75.9 kg)    Physical Exam Vitals and nursing note reviewed.  Constitutional:      General: She is not in acute distress.    Appearance: Normal appearance. She is not ill-appearing.  HENT:     Head: Normocephalic and atraumatic.     Right Ear: External ear normal.     Left Ear: External ear normal.     Nose: Nose normal.     Mouth/Throat:     Mouth: Mucous membranes are moist.      Pharynx: Oropharynx is clear.  Eyes:     Extraocular Movements: Extraocular movements intact.     Conjunctiva/sclera: Conjunctivae normal.     Pupils: Pupils are equal, round, and reactive to light.  Neck:     Vascular: No carotid bruit.  Cardiovascular:     Rate and Rhythm: Normal rate.     Pulses: Normal pulses.  Pulmonary:     Effort: Pulmonary effort is normal. No respiratory distress.  Abdominal:     General: Abdomen is flat. There is no distension.     Palpations: Abdomen is soft. There is no mass.     Tenderness: There is no abdominal tenderness. There is no right CVA tenderness, left CVA tenderness, guarding or rebound.     Hernia: No hernia is present.  Musculoskeletal:     Cervical back: No muscular tenderness.  Lymphadenopathy:     Cervical: No cervical adenopathy.  Skin:    General: Skin is warm and dry.     Capillary Refill: Capillary refill takes less than 2 seconds.     Coloration: Skin is not jaundiced or pale.     Findings: No bruising, erythema, lesion or rash.  Neurological:     General: No focal deficit present.  Mental Status: She is alert. Mental status is at baseline.  Psychiatric:        Mood and Affect: Mood normal.        Behavior: Behavior normal.        Thought Content: Thought content normal.        Judgment: Judgment normal.   Musculoskeletal:  Exam found Decreased ROM, Tissue texture changes, Tenderness to palpation, and Asymmetry of patient's  thorax, ribs, lumbar, pelvis, sacrum, and abdomen Osteopathic Structural Exam:   Thorax: T3-6SLRR  Ribs: Ribs 5-7 locked up on the R, Rib 6 locked up on the L  Lumbar: QL hypertonic on the R, L3-5SLRR  Pelvis: posterior R innominate, SI joint restricted on the R  Sacrum: R on R torsion  Abdomen: diaphragm hypertonic bilaterally R>L   Results for orders placed or performed in visit on 11/06/23  TSH   Collection Time: 11/06/23  1:23 PM  Result Value Ref Range   TSH 3.800 0.450 - 4.500 uIU/mL       Assessment & Plan:   Problem List Items Addressed This Visit       Other   Chronic bilateral low back pain without sciatica - Primary (Chronic)   She does have somatic dysfunction that is contributing to her symptoms. Myofacial in nature. Treated today with good results as below. Call with any concerns. Continue to monitor.       Other Visit Diagnoses       Somatic dysfunction of sacral region         Somatic dysfunction of pelvis region         Somatic dysfunction of lumbar region         Segmental dysfunction of abdomen         Thoracic segment dysfunction         Segmental dysfunction of rib cage          After verbal consent was obtained, patient was treated today with osteopathic manipulative medicine to the regions of the thorax, ribs, lumbar, pelvis, sacrum, and abdomen using the techniques of myofascial release, counterstrain, muscle energy, HVLA, and soft tissue. Areas of compensation relating to her primary pain source also treated. Patient tolerated the procedure well with good objective and good subjective improvement in symptoms. She left the room in good condition. She was advised to stay well hydrated and that she may have some soreness following the procedure. If not improving or worsening, she will call and come in. She will return for reevaluation  on a PRN basis.   Follow up plan: Return if symptoms worsen or fail to improve.

## 2023-11-13 ENCOUNTER — Telehealth: Payer: Self-pay | Admitting: *Deleted

## 2023-11-13 ENCOUNTER — Telehealth: Payer: Self-pay | Admitting: Nurse Practitioner

## 2023-11-13 NOTE — Telephone Encounter (Signed)
 Message sent to Seema

## 2023-11-13 NOTE — Telephone Encounter (Signed)
 Patient is having a flare up with muscle spasms. Dr Marcelino sent her a script for Flexiril the last time. She is asking if Emmy will send a script to [facility] for Flexiril. Please advise patient

## 2023-11-14 ENCOUNTER — Other Ambulatory Visit: Payer: Self-pay | Admitting: Nurse Practitioner

## 2023-11-14 MED ORDER — METHOCARBAMOL 500 MG PO TABS: 500.0000 mg | ORAL_TABLET | Freq: Three times a day (TID) | ORAL | 0 refills | Status: AC | PRN

## 2023-11-14 NOTE — Telephone Encounter (Signed)
Patient aware that Rx has been sent.  

## 2023-11-18 ENCOUNTER — Encounter: Payer: Self-pay | Admitting: Family Medicine

## 2023-11-18 NOTE — Assessment & Plan Note (Signed)
 She does have somatic dysfunction that is contributing to her symptoms. Myofacial in nature. Treated today with good results as below. Call with any concerns. Continue to monitor.

## 2023-11-29 DIAGNOSIS — R5382 Chronic fatigue, unspecified: Secondary | ICD-10-CM | POA: Diagnosis not present

## 2023-11-29 DIAGNOSIS — M47816 Spondylosis without myelopathy or radiculopathy, lumbar region: Secondary | ICD-10-CM | POA: Diagnosis not present

## 2023-12-01 DIAGNOSIS — M47816 Spondylosis without myelopathy or radiculopathy, lumbar region: Secondary | ICD-10-CM | POA: Diagnosis not present

## 2023-12-01 DIAGNOSIS — R5382 Chronic fatigue, unspecified: Secondary | ICD-10-CM | POA: Diagnosis not present

## 2023-12-06 ENCOUNTER — Encounter: Payer: Self-pay | Admitting: Family Medicine

## 2023-12-11 ENCOUNTER — Ambulatory Visit: Admitting: Family Medicine

## 2023-12-20 ENCOUNTER — Encounter: Payer: Self-pay | Admitting: Nurse Practitioner

## 2023-12-20 ENCOUNTER — Ambulatory Visit: Attending: Nurse Practitioner | Admitting: Nurse Practitioner

## 2023-12-20 VITALS — BP 98/50 | HR 83 | Temp 97.3°F | Resp 18 | Ht 61.0 in | Wt 175.0 lb

## 2023-12-20 DIAGNOSIS — G894 Chronic pain syndrome: Secondary | ICD-10-CM | POA: Insufficient documentation

## 2023-12-20 DIAGNOSIS — M797 Fibromyalgia: Secondary | ICD-10-CM | POA: Diagnosis not present

## 2023-12-20 DIAGNOSIS — R5382 Chronic fatigue, unspecified: Secondary | ICD-10-CM | POA: Diagnosis not present

## 2023-12-20 DIAGNOSIS — M47816 Spondylosis without myelopathy or radiculopathy, lumbar region: Secondary | ICD-10-CM | POA: Insufficient documentation

## 2023-12-20 DIAGNOSIS — M26629 Arthralgia of temporomandibular joint, unspecified side: Secondary | ICD-10-CM | POA: Insufficient documentation

## 2023-12-20 DIAGNOSIS — Z79899 Other long term (current) drug therapy: Secondary | ICD-10-CM | POA: Insufficient documentation

## 2023-12-20 MED ORDER — OXYCODONE-ACETAMINOPHEN 5-325 MG PO TABS: 1.0000 | ORAL_TABLET | Freq: Four times a day (QID) | ORAL | 0 refills | Status: AC | PRN

## 2023-12-20 MED ORDER — OXYCODONE-ACETAMINOPHEN 5-325 MG PO TABS
1.0000 | ORAL_TABLET | Freq: Four times a day (QID) | ORAL | 0 refills | Status: AC | PRN
Start: 2023-12-24 — End: 2024-01-23

## 2023-12-20 MED ORDER — OXYCODONE-ACETAMINOPHEN 5-325 MG PO TABS: 1.0000 | ORAL_TABLET | Freq: Four times a day (QID) | ORAL | 0 refills | Status: DC | PRN

## 2023-12-20 NOTE — Progress Notes (Signed)
 PROVIDER NOTE: Interpretation of information contained herein should be left to medically-trained personnel. Specific patient instructions are provided elsewhere under Patient Instructions section of medical record. This document was created in part using AI and STT-dictation technology, any transcriptional errors that may result from this process are unintentional.  Patient: Sharon Rodriguez  Service: E/M   PCP: Vicci Duwaine SQUIBB, DO  DOB: [birth year]  DOS: 12/20/2023  Provider: Emmy MARLA Blanch, NP  MRN: [id number]  Delivery: Face-to-face  Specialty: Interventional Pain Management  Type: Established Patient  Setting: Ambulatory outpatient facility  Specialty designation: 09  Referring Prov.: Vicci Duwaine SQUIBB, DO  Location: Outpatient office facility       History of present illness (HPI) Sharon Rodriguez, a 43 y.o. year old female, is here today because of her lower back pain. [name] primary complain today is Back Pain (Lower)  Pertinent problems: Sharon Rodriguez has Hypothyroidism; Rheumatoid arthritis (HCC); Depression; Fibromyalgia; TMJ pain dysfunction syndrome; H/O juvenile rheumatoid arthritis; Spondylosis without myelopathy or radiculopathy, lumbar region; Chronic bilateral low back pain without sciatica; Long-term current use of opioid analgesic, and Chronic pain syndrome on their pertinent problem list.  Pain Assessment: Severity of Chronic pain is reported as a 7 /10. Location: Back Lower/Pelvis. Onset: More than a month ago. Quality: Sharp, Other (Comment). Timing: Intermittent. Modifying factor(s): Sitting down, heat, ice, Medication. Vitals:  height is 5' 1 (1.549 m) and weight is 175 lb (79.4 kg). Her temporal temperature is 97.3 F (36.3 C) (abnormal). Her blood pressure is 98/50 (abnormal) and her pulse is 83. Her respiration is 18 and oxygen saturation is 99%.  BMI: Estimated body mass index is 33.07 kg/m as calculated from the following:   Height as of this encounter: 5' 1 (1.549  m).   Weight as of this encounter: 175 lb (79.4 kg).  Last encounter: 09/18/2023. Last procedure: Visit date not found.  Reason for encounter: medication management. No change in medical history since last visit.  Patient's pain is at baseline.  Patient continues multimodal pain regimen as prescribed.  States that it provides pain relief and improvement in functional status.  Patient continues experiencing low back pain, however, current pain medication regimen manage her pain level and provide functional ability.  No side effects or adverse reaction reported to medication. Pharmacotherapy Assessment   Oxycodone -acetaminophen  (Percocet) 5-325 mg tablet every 6 hours as needed for severe pain. MME = 30  Monitoring: [place] PMP: PDMP reviewed during this encounter.       Pharmacotherapy: No side-effects or adverse reactions reported. Compliance: No problems identified. Effectiveness: Clinically acceptable.  Erlene Doyal SAUN[place], [place]  12/20/2023 11:10 AM  Sign when Signing Visit Nursing Pain Medication Assessment:  Safety precautions to be maintained throughout the outpatient stay will include: orient to surroundings, keep bed in low position, maintain call bell within reach at all times, provide assistance with transfer out of bed and ambulation.  Medication Inspection Compliance: Pill count conducted under aseptic conditions, in front of the patient. Neither the pills nor the bottle was removed from the patient's sight at any time. Once count was completed pills were immediately returned to the patient in their original bottle.  Medication: Oxycodone  IR Pill/Patch Count: 16 of 120 pills/patches remain Pill/Patch Appearance: Markings consistent with prescribed medication Bottle Appearance: Standard pharmacy container. Clearly labeled. Filled Date: 09 / 13 / 2025 Last Medication intake:  Today    UDS:  Summary  Date Value Ref Range Status  06/21/2023 FINAL  Final  Comment:     ==================================================================== [employer] Select 13 (MW) ==================================================================== Test                             Result       Flag       Units  Drug Present and Declared for Prescription Verification   Oxycodone                       1967         EXPECTED   ng/mg creat   Oxymorphone                    475          EXPECTED   ng/mg creat   Noroxycodone                   2360         EXPECTED   ng/mg creat   Noroxymorphone                 194          EXPECTED   ng/mg creat    Sources of oxycodone  are scheduled prescription medications.    Oxymorphone, noroxycodone, and noroxymorphone are expected    metabolites of oxycodone . Oxymorphone is also available as a    scheduled prescription medication.  Drug Absent but Declared for Prescription Verification   Amphetamine                    Not Detected UNEXPECTED ng/mg creat ==================================================================== Test                      Result    Flag   Units      Ref Range   Creatinine              151              mg/dL      >=79 ==================================================================== Declared Medications:  The flagging and interpretation on this report are based on the  following declared medications.  Unexpected results may arise from  inaccuracies in the declared medications.   **Note: The testing scope of this panel includes these medications:   Amphetamine (Adderall)  Oxycodone  (Percocet)   **Note: The testing scope of this panel does not include the  following reported medications:   Acetaminophen  (Percocet)  Albuterol  (Ventolin  HFA)  Epinephrine  (EpiPen )  Levothyroxine  (Synthroid )  Ondansetron  (Zofran )  Semaglutide  (Ozempic )  Vitamin D2 (Drisdol ) ==================================================================== For clinical consultation, please call [phone]. ====================================================================     No results found for: CBDTHCR No results found for: D8THCCBX No results found for: D9THCCBX  ROS  Constitutional: Denies any fever or chills Gastrointestinal: No reported hemesis, hematochezia, vomiting, or acute GI distress Musculoskeletal: Low back pain Neurological: No reported episodes of acute onset apraxia, aphasia, dysarthria, agnosia, amnesia, paralysis, loss of coordination, or loss of consciousness  Medication Review  EPINEPHrine , FreeStyle Libre 3 Sensor, ONE TOUCH ULTRA 2, Pen Needles, Semaglutide  (1 MG/DOSE), Vitamin D  (Ergocalciferol ), albuterol , amphetamine-dextroamphetamine, glucose blood, levothyroxine , methocarbamol , ondansetron , onetouch ultrasoft, and oxyCODONE -acetaminophen   History Review  Allergy: Sharon Rodriguez is allergic to ibuprofen, metformin  and related, and promethazine. Drug: Sharon Rodriguez  reports no history of drug use. Alcohol:  reports no history of alcohol use. Tobacco:  reports that she has never smoked. She has never used smokeless tobacco. Social: Sharon Rodriguez  reports that she has never smoked. She has never used smokeless tobacco. She reports that she does not drink alcohol and does not use drugs. Medical:  has a past medical history of ADD (attention deficit disorder), Anxiety, Asthma, Biliary dyskinesia (12/27/2017), Chronic bronchitis (HCC), Chronic fatigue, Chronic pain, Depression, Diabetes mellitus without complication (HCC), Fibromyalgia, GERD (gastroesophageal reflux disease), History of stomach ulcers (2019), Hypothyroidism (01/27/2015), Learning disability, Osteoarthritis, Rheumatoid arthritis (HCC), Thyroid  disease, and TMJ (dislocation of temporomandibular joint). Surgical: Sharon Rodriguez  has a past surgical history that includes Cesarean section; Esophagogastroduodenoscopy (egd) with propofol  (N/A, 12/07/2017); and Cholecystectomy (N/A, 01/14/2018). Family: family  history includes ADD / ADHD in her daughter and mother; Allergies in her daughter and daughter; Arthritis in her brother and paternal grandmother; COPD in her mother; Cancer in her maternal grandmother; Dementia in her paternal grandmother; Diabetes in her father and mother; Heart attack in her maternal grandfather; Heart disease in her maternal grandfather; Hyperlipidemia in her father; Learning disabilities in her mother; Mental illness in her daughter, father, and sister; Stroke in her maternal grandfather; Thyroid  disease in her daughter.  Laboratory Chemistry Profile   Renal Lab Results  Component Value Date   BUN 9 07/03/2023   CREATININE 0.80 07/03/2023   BCR 11 07/03/2023   GFRAA 94 04/16/2020   GFRNONAA 82 04/16/2020    Hepatic Lab Results  Component Value Date   AST 26 07/03/2023   ALT 13 07/03/2023   ALBUMIN 4.2 07/03/2023   ALKPHOS 68 07/03/2023   LIPASE 43 04/10/2017    Electrolytes Lab Results  Component Value Date   NA 140 07/03/2023   K 4.1 07/03/2023   CL 103 07/03/2023   CALCIUM 9.0 07/03/2023   MG 2.2 07/29/2020    Bone Lab Results  Component Value Date   VD25OH 40.1 07/03/2023    Inflammation (CRP: Acute Phase) (ESR: Chronic Phase) No results found for: CRP, ESRSEDRATE, LATICACIDVEN       Note: Above Lab results reviewed.  Recent Imaging Review  LONG TERM MONITOR (3-14 DAYS) HR 62 - 167, average 90 bpm. Rare supraventricular and ventricular ectopy. No atrial fibrillation. No sustained arrhythmias.  Ole T. Cindie, MD, [facility], [facility] Cardiac Electrophysiology Note: Reviewed        Physical Exam  Vitals: BP (!) 98/50 (BP Location: Right Arm, Patient Position: Sitting)   Pulse 83   Temp (!) 97.3 F (36.3 C) (Temporal)   Resp 18   Ht 5' 1 (1.549 m)   Wt 175 lb (79.4 kg)   SpO2 99%   BMI 33.07 kg/m  BMI: Estimated body mass index is 33.07 kg/m as calculated from the following:   Height as of this encounter: 5' 1 (1.549 m).    Weight as of this encounter: 175 lb (79.4 kg). Ideal: Ideal body weight: 47.8 kg (105 lb 6.1 oz) Adjusted ideal body weight: 60.4 kg (133 lb 3.6 oz) General appearance: Well nourished, well developed, and well hydrated. In no apparent acute distress Mental status: Alert, oriented x 3 (person, place, & time)       Respiratory: No evidence of acute respiratory distress Eyes: PERLA  Musculoskeletal: +LBP Assessment   Diagnosis Status  1. Chronic pain syndrome   2. Medication management   3. Fibromyalgia   4. Chronic fatigue   5. Spondylosis without myelopathy or radiculopathy, lumbar region   6. TMJ pain dysfunction syndrome    Controlled Controlled Controlled   Updated Problems: No problems updated.  Plan of Care  Problem-specific:  Assessment and Plan Chronic pain syndrome: Patient's pain is well-controlled with Percocet, will continue on current medication regimen.  Prescribing drug monitoring (PDMP) reviewed, findings consistent with the use of prescribed medication and no evidence of narcotic misuse or abuse.  Urine drug screening (UDS) up to date.  The patient was advised to drink more water to prevent from opioid-induced constipation.  No evidence of reaction or side effect reported to medication.  Schedule follow-up in 90 days for medication management.  Fibromyalgia: Fibromyalgia is a chronic condition that involves widespread body pain and chronic fatigue.  It can also involve issues with sleep memory and mood, however, it is well controlled with Percocet.   Sharon Rodriguez has a current medication list which includes the following long-term medication(s): albuterol  and levothyroxine .  Pharmacotherapy (Medications Ordered): Meds ordered this encounter  Medications   oxyCODONE -acetaminophen  (PERCOCET) 5-325 MG tablet    Sig: Take 1 tablet by mouth every 6 (six) hours as needed for severe pain (pain score 7-10). Must last 30 days.    Dispense:  120 tablet     Refill:  0    [place] STOP ACT - Not applicable. Fill one day early if pharmacy is closed on scheduled refill date.   oxyCODONE -acetaminophen  (PERCOCET) 5-325 MG tablet    Sig: Take 1 tablet by mouth every 6 (six) hours as needed for severe pain (pain score 7-10). Must last 30 days.    Dispense:  120 tablet    Refill:  0    [place] STOP ACT - Not applicable. Fill one day early if pharmacy is closed on scheduled refill date.   oxyCODONE -acetaminophen  (PERCOCET) 5-325 MG tablet    Sig: Take 1 tablet by mouth every 6 (six) hours as needed for severe pain (pain score 7-10). Must last 30 days.    Dispense:  120 tablet    Refill:  0    [place] STOP ACT - Not applicable. Fill one day early if pharmacy is closed on scheduled refill date.   Orders:  No orders of the defined types were placed in this encounter.       Return in about 3 months (around 03/21/2024) for (F2F), (MM), Emmy Blanch NP.    Recent Visits No visits were found meeting these conditions. Showing recent visits within past 90 days and meeting all other requirements Today's Visits Date Type Provider Dept  12/20/23 Office Visit Datrell Dunton K, NP [facility]  Showing today's visits and meeting all other requirements Future Appointments Date Type Provider Dept  03/17/24 Appointment Krishawn Vanderweele K, NP [facility]  Showing future appointments within next 90 days and meeting all other requirements  I discussed the assessment and treatment plan with the patient. The patient was provided an opportunity to ask questions and all were answered. The patient agreed with the plan and demonstrated an understanding of the instructions.  Patient advised to call back or seek an in-person evaluation if the symptoms or condition worsens.  I personally spent a total of 30 minutes in the care of the patient today including preparing to see the patient, getting/reviewing separately obtained history, performing a medically appropriate  exam/evaluation, counseling and educating, placing orders, referring and communicating with other health care professionals, documenting clinical information in the EHR, independently interpreting results, communicating results, and coordinating care.   Note by: Emmy MARLA Blanch, NP  Date: 12/20/2023; Time: 12:38 PM

## 2023-12-20 NOTE — Progress Notes (Signed)
 Nursing Pain Medication Assessment:  Safety precautions to be maintained throughout the outpatient stay will include: orient to surroundings, keep bed in low position, maintain call bell within reach at all times, provide assistance with transfer out of bed and ambulation.  Medication Inspection Compliance: Pill count conducted under aseptic conditions, in front of the patient. Neither the pills nor the bottle was removed from the patient's sight at any time. Once count was completed pills were immediately returned to the patient in their original bottle.  Medication: Oxycodone  IR Pill/Patch Count: 16 of 120 pills/patches remain Pill/Patch Appearance: Markings consistent with prescribed medication Bottle Appearance: Standard pharmacy container. Clearly labeled. Filled Date: 09 / 13 / 2025 Last Medication intake:  Today

## 2023-12-24 ENCOUNTER — Encounter: Payer: Self-pay | Admitting: Family Medicine

## 2023-12-24 ENCOUNTER — Other Ambulatory Visit: Payer: Self-pay | Admitting: Family Medicine

## 2023-12-24 ENCOUNTER — Ambulatory Visit: Admitting: Family Medicine

## 2023-12-24 VITALS — BP 106/73 | HR 73 | Temp 98.1°F | Ht 61.0 in | Wt 173.0 lb

## 2023-12-24 DIAGNOSIS — E039 Hypothyroidism, unspecified: Secondary | ICD-10-CM | POA: Diagnosis not present

## 2023-12-24 DIAGNOSIS — Z789 Other specified health status: Secondary | ICD-10-CM

## 2023-12-24 DIAGNOSIS — E1139 Type 2 diabetes mellitus with other diabetic ophthalmic complication: Secondary | ICD-10-CM

## 2023-12-24 DIAGNOSIS — R5383 Other fatigue: Secondary | ICD-10-CM

## 2023-12-24 DIAGNOSIS — Z1231 Encounter for screening mammogram for malignant neoplasm of breast: Secondary | ICD-10-CM

## 2023-12-24 DIAGNOSIS — E559 Vitamin D deficiency, unspecified: Secondary | ICD-10-CM

## 2023-12-24 DIAGNOSIS — M069 Rheumatoid arthritis, unspecified: Secondary | ICD-10-CM

## 2023-12-24 DIAGNOSIS — J42 Unspecified chronic bronchitis: Secondary | ICD-10-CM

## 2023-12-24 DIAGNOSIS — F339 Major depressive disorder, recurrent, unspecified: Secondary | ICD-10-CM

## 2023-12-24 DIAGNOSIS — Z0000 Encounter for general adult medical examination without abnormal findings: Secondary | ICD-10-CM

## 2023-12-24 DIAGNOSIS — Z1211 Encounter for screening for malignant neoplasm of colon: Secondary | ICD-10-CM

## 2023-12-24 DIAGNOSIS — F419 Anxiety disorder, unspecified: Secondary | ICD-10-CM

## 2023-12-24 LAB — BAYER DCA HB A1C WAIVED: HB A1C (BAYER DCA - WAIVED): 5.8 % — ABNORMAL HIGH (ref 4.8–5.6)

## 2023-12-24 MED ORDER — EPINEPHRINE 0.3 MG/0.3ML IJ SOAJ: 0.3000 mg | INTRAMUSCULAR | 12 refills | Status: DC | PRN

## 2023-12-24 MED ORDER — OZEMPIC (1 MG/DOSE) 4 MG/3ML ~~LOC~~ SOPN: 1.0000 mg | PEN_INJECTOR | SUBCUTANEOUS | 1 refills | Status: AC

## 2023-12-24 MED ORDER — ALBUTEROL SULFATE HFA 108 (90 BASE) MCG/ACT IN AERS: 1.0000 | INHALATION_SPRAY | Freq: Four times a day (QID) | RESPIRATORY_TRACT | 6 refills | Status: AC | PRN

## 2023-12-24 MED ORDER — ONDANSETRON HCL 4 MG PO TABS: 4.0000 mg | ORAL_TABLET | Freq: Three times a day (TID) | ORAL | 0 refills | Status: AC | PRN

## 2023-12-24 NOTE — Progress Notes (Signed)
 BP 106/73   Pulse 73   Temp 98.1 F (36.7 C) (Oral)   Ht 5' 1 (1.549 m)   Wt 173 lb (78.5 kg)   SpO2 98%   BMI 32.69 kg/m    Subjective:    Patient ID: Sharon Rodriguez, female    DOB: 1981/01/18, 43 y.o.   MRN: [id number]  HPI: Sharon Rodriguez is a 43 y.o. female presenting on 12/24/2023 for comprehensive medical examination. Current medical complaints include:  DIABETES Hypoglycemic episodes:no Polydipsia/polyuria: no Visual disturbance: no Chest pain: no Paresthesias: no Glucose Monitoring: no Taking Insulin?: no Blood Pressure Monitoring: not checking Retinal Examination: Up to Date Foot Exam: Up to Date Diabetic Education: Completed Pneumovax: Up to Date Influenza: Not up to Date Aspirin: no  HYPOTHYROIDISM Thyroid  control status:controlled Satisfied with current treatment? yes Medication side effects: no Medication compliance: excellent compliance Recent dose adjustment:no Fatigue: yes Cold intolerance: no Heat intolerance: no Weight gain: no Weight loss: no Constipation: no Diarrhea/loose stools: no Palpitations: no Lower extremity edema: no Anxiety/depressed mood: no  Continues to follow with psychiatry for her mood. No concerns.  She currently lives with: husband and kids Menopausal Symptoms: no  Functional Status Survey: Is the patient deaf or have difficulty hearing?: Yes Does the patient have difficulty seeing, even when wearing glasses/contacts?: Yes Does the patient have difficulty concentrating, remembering, or making decisions?: Yes Does the patient have difficulty walking or climbing stairs?: Yes Does the patient have difficulty dressing or bathing?: No Does the patient have difficulty doing errands alone such as visiting a doctor's office or shopping?: Yes     12/24/2023    4:05 PM 12/20/2023   11:09 AM 09/18/2023   10:35 AM 07/03/2023    3:46 PM 06/21/2023   10:49 AM  Fall Risk   Falls in the past year? 0 0 0 0 0  Number falls  in past yr: 0 0 0 0   Injury with Fall? 0 0 0 0   Risk for fall due to : No Fall Risks   No Fall Risks   Follow up Falls evaluation completed   Falls evaluation completed     Depression Screen    12/24/2023    4:05 PM 12/20/2023   11:09 AM 09/18/2023   10:36 AM 06/21/2023   10:49 AM 12/26/2022   10:22 AM  Depression screen PHQ 2/9  Decreased Interest 3 0 0 0 0  Down, Depressed, Hopeless 2 0 0 0 0  PHQ - 2 Score 5 0 0 0 0  Altered sleeping 3      Tired, decreased energy 3      Change in appetite 0      Feeling bad or failure about yourself  2      Trouble concentrating 2      Moving slowly or fidgety/restless 1      Suicidal thoughts 0      PHQ-9 Score 16      Difficult doing work/chores Somewhat difficult         Advanced Directives Does patient have a HCPOA?    no If yes, name and contact information:  Does patient have a living will or MOST form?  no  Past Medical History:  Past Medical History:  Diagnosis Date   ADD (attention deficit disorder)    Anxiety    Asthma    WELL CONTROLLED   Biliary dyskinesia 12/27/2017   Chronic bronchitis (HCC)    Chronic fatigue    Chronic  pain    Depression    Diabetes mellitus without complication (HCC)    Fibromyalgia    GERD (gastroesophageal reflux disease)    History of stomach ulcers 2019   Hypothyroidism 01/27/2015   Learning disability    Osteoarthritis    Rheumatoid arthritis (HCC)    Thyroid  disease    TMJ (dislocation of temporomandibular joint)     Surgical History:  Past Surgical History:  Procedure Laterality Date   CESAREAN SECTION     x 2   CHOLECYSTECTOMY N/A 01/14/2018   Procedure: LAPAROSCOPIC CHOLECYSTECTOMY;  Surgeon: Nicholaus Selinda Birmingham, MD;  Location: [facility] ORS;  Service: General;  Laterality: N/A;   ESOPHAGOGASTRODUODENOSCOPY (EGD) WITH PROPOFOL  N/A 12/07/2017   Procedure: ESOPHAGOGASTRODUODENOSCOPY (EGD) WITH PROPOFOL ;  Surgeon: Therisa Bi, MD;  Location: [facility] ENDOSCOPY;  Service: Gastroenterology;   Laterality: N/A;    Medications:  Current Outpatient Medications on File Prior to Visit  Medication Sig   amphetamine-dextroamphetamine (ADDERALL) 30 MG tablet Take 1 tablet by mouth 2 (two) times daily.   Blood Glucose Monitoring Suppl (ONE TOUCH ULTRA 2) w/Device KIT 1 each by Does not apply route daily.   Continuous Glucose Sensor (FREESTYLE LIBRE 3 SENSOR) MISC 1 each by Does not apply route every 14 (fourteen) days. Place 1 sensor on the skin every 14 days. Use to check glucose continuously   glucose blood (ONETOUCH ULTRA) test strip 1 each by Other route daily. Use as instructed   Insulin Pen Needle (PEN NEEDLES) 32G X 5 MM MISC 1 each by Does not apply route once a week.   Lancets (ONETOUCH ULTRASOFT) lancets 1 each by Other route daily. Use as instructed   levothyroxine  (SYNTHROID ) 75 MCG tablet Take 1 tablet (75 mcg total) by mouth daily before breakfast.   methocarbamol  (ROBAXIN ) 500 MG tablet Take 500 mg by mouth 4 (four) times daily.   oxyCODONE -acetaminophen  (PERCOCET) 5-325 MG tablet Take 1 tablet by mouth every 6 (six) hours as needed for severe pain (pain score 7-10). Must last 30 days.   [START ON 01/23/2024] oxyCODONE -acetaminophen  (PERCOCET) 5-325 MG tablet Take 1 tablet by mouth every 6 (six) hours as needed for severe pain (pain score 7-10). Must last 30 days.   [START ON 02/22/2024] oxyCODONE -acetaminophen  (PERCOCET) 5-325 MG tablet Take 1 tablet by mouth every 6 (six) hours as needed for severe pain (pain score 7-10). Must last 30 days.   Vitamin D , Ergocalciferol , (DRISDOL ) 1.25 MG (50000 UNIT) CAPS capsule Take 1 capsule (50,000 Units total) by mouth every 7 (seven) days.   No current facility-administered medications on file prior to visit.    Allergies:  Allergies  Allergen Reactions   Ibuprofen Diarrhea and Nausea Only    Other Reaction: GI Upset   Metformin  And Related Diarrhea   Promethazine Rash    Social History:  Social History   Socioeconomic  History   Marital status: Married    Spouse name: Not on file   Number of children: Not on file   Years of education: Not on file   Highest education level: Not on file  Occupational History   Not on file  Tobacco Use   Smoking status: Never   Smokeless tobacco: Never  Vaping Use   Vaping status: Never Used  Substance and Sexual Activity   Alcohol use: No   Drug use: No   Sexual activity: Not Currently    Birth control/protection: None  Other Topics Concern   Not on file  Social History Narrative   Not  on file   Social Drivers of Health   Financial Resource Strain: Low Risk  (06/20/2022)   Overall Financial Resource Strain (CARDIA)    Difficulty of Paying Living Expenses: Not very hard  Food Insecurity: No Food Insecurity (12/24/2023)   Hunger Vital Sign    Worried About Running Out of Food in the Last Year: Never true    Ran Out of Food in the Last Year: Never true  Transportation Needs: No Transportation Needs (12/24/2023)   PRAPARE - Administrator, Civil Service (Medical): No    Lack of Transportation (Non-Medical): No  Physical Activity: Insufficiently Active (06/20/2022)   Exercise Vital Sign    Days of Exercise per Week: 2 days    Minutes of Exercise per Session: 20 min  Stress: No Stress Concern Present (06/20/2022)   [employer] of Occupational Health - Occupational Stress Questionnaire    Feeling of Stress : Only a little  Social Connections: Socially Integrated (06/20/2022)   Social Connection and Isolation Panel    Frequency of Communication with Friends and Family: More than three times a week    Frequency of Social Gatherings with Friends and Family: Never    Attends Religious Services: More than 4 times per year    Active Member of [employer] or Organizations: Yes    Attends Engineer, structural: More than 4 times per year    Marital Status: Married  Catering manager Violence: Not At Risk (06/20/2022)   Humiliation, Afraid, Rape, and Kick  questionnaire    Fear of Current or Ex-Partner: No    Emotionally Abused: No    Physically Abused: No    Sexually Abused: No   Social History   Tobacco Use  Smoking Status Never  Smokeless Tobacco Never   Social History   Substance and Sexual Activity  Alcohol Use No    Family History:  Family History  Problem Relation Age of Onset   Diabetes Mother    ADD / ADHD Mother    Learning disabilities Mother    COPD Mother    Diabetes Father    Hyperlipidemia Father    Mental illness Father        Depression/anxiety   Mental illness Sister        Depression/Anxiety   Arthritis Brother        Rheumatoid   Mental illness Daughter        anxiety   Thyroid  disease Daughter    ADD / ADHD Daughter    Allergies Daughter    Cancer Maternal Grandmother        Skin, Lung   Heart disease Maternal Grandfather    Stroke Maternal Grandfather    Heart attack Maternal Grandfather    Arthritis Paternal Grandmother        Rheumatoid   Dementia Paternal Grandmother    Allergies Daughter     Past medical history, surgical history, medications, allergies, family history and social history reviewed with patient today and changes made to appropriate areas of the chart.   Review of Systems  Constitutional:  Positive for malaise/fatigue. Negative for chills, diaphoresis, fever and weight loss.  HENT: Negative.    Eyes:  Positive for blurred vision. Negative for double vision, photophobia, pain, discharge and redness.  Respiratory:  Positive for cough and wheezing. Negative for hemoptysis, sputum production and shortness of breath.   Cardiovascular:  Positive for palpitations. Negative for chest pain, orthopnea, claudication and leg swelling.  Gastrointestinal:  Positive for diarrhea,  heartburn, nausea and vomiting. Negative for abdominal pain, blood in stool, constipation and melena.  Genitourinary: Negative.   Musculoskeletal:  Positive for back pain and myalgias. Negative for falls,  joint pain and neck pain.  Skin: Negative.   Neurological:  Positive for dizziness and tingling. Negative for tremors, sensory change, speech change, focal weakness, seizures, loss of consciousness, weakness and headaches.  Endo/Heme/Allergies:  Positive for polydipsia. Negative for environmental allergies. Bruises/bleeds easily.  Psychiatric/Behavioral:  Positive for depression. Negative for hallucinations, memory loss, substance abuse and suicidal ideas. The patient is nervous/anxious. The patient does not have insomnia.     All other ROS negative except what is listed above and in the HPI.      Objective:    BP 106/73   Pulse 73   Temp 98.1 F (36.7 C) (Oral)   Ht 5' 1 (1.549 m)   Wt 173 lb (78.5 kg)   SpO2 98%   BMI 32.69 kg/m   Wt Readings from Last 3 Encounters:  12/24/23 173 lb (78.5 kg)  12/20/23 175 lb (79.4 kg)  09/24/23 170 lb (77.1 kg)     Physical Exam Vitals and nursing note reviewed.  Constitutional:      General: She is not in acute distress.    Appearance: Normal appearance. She is not ill-appearing, toxic-appearing or diaphoretic.  HENT:     Head: Normocephalic and atraumatic.     Right Ear: Tympanic membrane, ear canal and external ear normal. There is no impacted cerumen.     Left Ear: Tympanic membrane, ear canal and external ear normal. There is no impacted cerumen.     Nose: Nose normal. No congestion or rhinorrhea.     Mouth/Throat:     Mouth: Mucous membranes are moist.     Pharynx: Oropharynx is clear. No oropharyngeal exudate or posterior oropharyngeal erythema.  Eyes:     General: No scleral icterus.       Right eye: No discharge.        Left eye: No discharge.     Extraocular Movements: Extraocular movements intact.     Conjunctiva/sclera: Conjunctivae normal.     Pupils: Pupils are equal, round, and reactive to light.  Neck:     Vascular: No carotid bruit.  Cardiovascular:     Rate and Rhythm: Normal rate and regular rhythm.      Pulses: Normal pulses.     Heart sounds: No murmur heard.    No friction rub. No gallop.  Pulmonary:     Effort: Pulmonary effort is normal. No respiratory distress.     Breath sounds: Normal breath sounds. No stridor. No wheezing, rhonchi or rales.  Chest:     Chest wall: No tenderness.  Abdominal:     General: Abdomen is flat. Bowel sounds are normal. There is no distension.     Palpations: Abdomen is soft. There is no mass.     Tenderness: There is no abdominal tenderness. There is no right CVA tenderness, left CVA tenderness, guarding or rebound.     Hernia: No hernia is present.  Genitourinary:    Comments: Breast and pelvic exams deferred with shared decision making Musculoskeletal:        General: No swelling, tenderness, deformity or signs of injury.     Cervical back: Normal range of motion and neck supple. No rigidity. No muscular tenderness.     Right lower leg: No edema.     Left lower leg: No edema.  Lymphadenopathy:  Cervical: No cervical adenopathy.  Skin:    General: Skin is warm and dry.     Capillary Refill: Capillary refill takes less than 2 seconds.     Coloration: Skin is not jaundiced or pale.     Findings: No bruising, erythema, lesion or rash.  Neurological:     General: No focal deficit present.     Mental Status: She is alert and oriented to person, place, and time. Mental status is at baseline.     Cranial Nerves: No cranial nerve deficit.     Sensory: No sensory deficit.     Motor: No weakness.     Coordination: Coordination normal.     Gait: Gait normal.     Deep Tendon Reflexes: Reflexes normal.  Psychiatric:        Mood and Affect: Mood normal.        Behavior: Behavior normal.        Thought Content: Thought content normal.        Judgment: Judgment normal.        12/24/2023    4:07 PM 06/20/2022    1:42 PM 06/25/2017    1:38 PM  6CIT Screen  What Year? 4 points 0 points 0 points  What month? 0 points 0 points 0 points  What time? 0  points 0 points 0 points  Count back from 20 0 points 0 points 0 points  Months in reverse 4 points 0 points 0 points  Repeat phrase 4 points 0 points 2 points  Total Score 12 points 0 points 2 points    Results for orders placed or performed in visit on 12/24/23  Bayer DCA Hb A1c Waived   Collection Time: 12/24/23  4:16 PM  Result Value Ref Range   HB A1C (BAYER DCA - WAIVED) 5.8 (H) 4.8 - 5.6 %  CBC with Differential/Platelet   Collection Time: 12/24/23  4:20 PM  Result Value Ref Range   WBC 8.0 3.4 - 10.8 x10E3/uL   RBC 4.48 3.77 - 5.28 x10E6/uL   Hemoglobin 12.8 11.1 - 15.9 g/dL   Hematocrit 61.3 65.9 - 46.6 %   MCV 86 79 - 97 fL   MCH 28.6 26.6 - 33.0 pg   MCHC 33.2 31.5 - 35.7 g/dL   RDW 87.1 88.2 - 84.5 %   Platelets 307 150 - 450 x10E3/uL   Neutrophils 56 Not Estab. %   Lymphs 35 Not Estab. %   Monocytes 6 Not Estab. %   Eos 2 Not Estab. %   Basos 1 Not Estab. %   Neutrophils Absolute 4.6 1.4 - 7.0 x10E3/uL   Lymphocytes Absolute 2.8 0.7 - 3.1 x10E3/uL   Monocytes Absolute 0.5 0.1 - 0.9 x10E3/uL   EOS (ABSOLUTE) 0.1 0.0 - 0.4 x10E3/uL   Basophils Absolute 0.0 0.0 - 0.2 x10E3/uL   Immature Granulocytes 0 Not Estab. %   Immature Grans (Abs) 0.0 0.0 - 0.1 x10E3/uL  Comprehensive metabolic panel with GFR   Collection Time: 12/24/23  4:20 PM  Result Value Ref Range   Glucose 89 70 - 99 mg/dL   BUN 8 6 - 24 mg/dL   Creatinine, Ser 9.22 0.57 - 1.00 mg/dL   eGFR 97 >40 fO/fpw/8.26   BUN/Creatinine Ratio 10 9 - 23   Sodium 141 134 - 144 mmol/L   Potassium 4.1 3.5 - 5.2 mmol/L   Chloride 103 96 - 106 mmol/L   CO2 25 20 - 29 mmol/L   Calcium 8.7 8.7 -  10.2 mg/dL   Total Protein 7.0 6.0 - 8.5 g/dL   Albumin 4.2 3.9 - 4.9 g/dL   Globulin, Total 2.8 1.5 - 4.5 g/dL   Bilirubin Total 0.4 0.0 - 1.2 mg/dL   Alkaline Phosphatase 69 41 - 116 IU/L   AST 40 0 - 40 IU/L   ALT 25 0 - 32 IU/L  Lipid Panel w/o Chol/HDL Ratio   Collection Time: 12/24/23  4:20 PM  Result Value  Ref Range   Cholesterol, Total 166 100 - 199 mg/dL   Triglycerides 786 (H) 0 - 149 mg/dL   HDL 35 (L) >60 mg/dL   VLDL Cholesterol Cal 36 5 - 40 mg/dL   LDL Chol Calc (NIH) 95 0 - 99 mg/dL  TSH   Collection Time: 12/24/23  4:20 PM  Result Value Ref Range   TSH 2.800 0.450 - 4.500 uIU/mL  Ferritin   Collection Time: 12/24/23  4:20 PM  Result Value Ref Range   Ferritin 86 15 - 150 ng/mL  B12   Collection Time: 12/24/23  4:20 PM  Result Value Ref Range   Vitamin B-12 318 232 - 1,245 pg/mL  Iron Binding Cap (TIBC)([facility])   Collection Time: 12/24/23  4:20 PM  Result Value Ref Range   Total Iron Binding Capacity 334 250 - 450 ug/dL   UIBC 732 868 - 574 ug/dL   Iron 67 27 - 840 ug/dL   Iron Saturation 20 15 - 55 %  VITAMIN D  25 Hydroxy (Vit-D Deficiency, Fractures)   Collection Time: 12/24/23  4:20 PM  Result Value Ref Range   Vit D, 25-Hydroxy 33.8 30.0 - 100.0 ng/mL  FSH/LH   Collection Time: 12/24/23  4:20 PM  Result Value Ref Range   LH 6.3 mIU/mL   FSH 5.7 mIU/mL  Estradiol   Collection Time: 12/24/23  4:20 PM  Result Value Ref Range   Estradiol 143.0 pg/mL  Hepatitis B surface antibody,quantitative   Collection Time: 12/24/23  4:20 PM  Result Value Ref Range   Hepatitis B Surf Ab Quant <3.5 (L) Immunity>10 mIU/mL      Assessment & Plan:   Problem List Items Addressed This Visit       Respiratory   Chronic bronchitis (HCC)   Under good control on current regimen. Continue current regimen. Continue to monitor. Call with any concerns. Refills given.          Endocrine   Hypothyroidism   Rechecking labs today. Await results. Treat as needed.        Relevant Orders   TSH (Completed)   Diabetes mellitus (HCC)   Doing well with A1c of 5.8. Continue current regimen. Call with any concerns.       Relevant Medications   Semaglutide , 1 MG/DOSE, (OZEMPIC , 1 MG/DOSE,) 4 MG/3ML SOPN   Other Relevant Orders   Bayer DCA Hb A1c Waived (Completed)   CBC  with Differential/Platelet (Completed)   Comprehensive metabolic panel with GFR (Completed)   Lipid Panel w/o Chol/HDL Ratio (Completed)   Microalbumin, Urine Waived     Musculoskeletal and Integument   Rheumatoid arthritis (HCC)   Continue to follow with rheumatology. Call with any concerns. Continue to monitor.        Other   Anxiety   Continue to follow with psychiatry. Call with any concerns. Continue to monitor.      Depression   Continue to follow with psychiatry. Call with any concerns. Continue to monitor.      Vitamin D  deficiency  Rechecking labs today. Await results. Treat as needed.       Relevant Orders   VITAMIN D  25 Hydroxy (Vit-D Deficiency, Fractures) (Completed)   Other Visit Diagnoses       Encounter for Medicare annual wellness exam    -  Primary   Preventative care discussed today as below.     Routine general medical examination at a health care facility       Vaccines up to date/declined. Screening labs checked today. Pap up to date. mammo and cologuard ordered. Continue diet and exercise. Call with any concerns.     Other fatigue       Relevant Orders   Ferritin (Completed)   B12 (Completed)   Iron Binding Cap (TIBC)([facility]) (Completed)   FSH/LH (Completed)   Estradiol (Completed)     Hepatitis B vaccination status unknown       Labs drawn today. Await results.   Relevant Orders   Hepatitis B surface antibody,quantitative (Completed)     Encounter for screening mammogram for malignant neoplasm of breast       Mammo ordered today.   Relevant Orders   MM 3D SCREENING MAMMOGRAM BILATERAL BREAST     Screening for colon cancer       Cologuard ordered today.   Relevant Orders   Cologuard        Preventative Services:  Health Risk Assessment and Personalized Prevention Plan: done today Bone Mass Measurements: N/A Breast Cancer Screening: ordered today CVD Screening: ordered today Cervical Cancer Screening: N/A Colon Cancer  Screening: ordered today Depression Screening: done today Diabetes Screening: done today Glaucoma Screening: see your eye doctor Hepatitis B vaccine: checked today Hepatitis C screening: up to date HIV Screening: up to date Flu Vaccine:declined Lung cancer Screening: N/A Obesity Screening: done today Pneumonia Vaccines (2): up to date STI Screening: N/A  Follow up plan: Return in about 6 months (around 06/23/2024).   LABORATORY TESTING:  - Pap smear: Refused  IMMUNIZATIONS:   - Tdap: Tetanus vaccination status reviewed: last tetanus booster within 10 years. - Influenza: Refused - Pneumovax: Refused - Prevnar: Refused - Zostavax vaccine: Not applicable  SCREENING: -Mammogram: Ordered today  - Colonoscopy: Ordered today   PATIENT COUNSELING:   Advised to take 1 mg of folate supplement per day if capable of pregnancy.   Sexuality: Discussed sexually transmitted diseases, partner selection, use of condoms, avoidance of unintended pregnancy  and contraceptive alternatives.   Advised to avoid cigarette smoking.  I discussed with the patient that most people either abstain from alcohol or drink within safe limits (<=14/week and <=4 drinks/occasion for males, <=7/weeks and <= 3 drinks/occasion for females) and that the risk for alcohol disorders and other health effects rises proportionally with the number of drinks per week and how often a drinker exceeds daily limits.  Discussed cessation/primary prevention of drug use and availability of treatment for abuse.   Diet: Encouraged to adjust caloric intake to maintain  or achieve ideal body weight, to reduce intake of dietary saturated fat and total fat, to limit sodium intake by avoiding high sodium foods and not adding table salt, and to maintain adequate dietary potassium and calcium preferably from fresh fruits, vegetables, and low-fat dairy products.    stressed the importance of regular exercise  Injury prevention: Discussed  safety belts, safety helmets, smoke detector, smoking near bedding or upholstery.   Dental health: Discussed importance of regular tooth brushing, flossing, and dental visits.    NEXT PREVENTATIVE PHYSICAL DUE IN  1 YEAR. Return in about 6 months (around 06/23/2024).

## 2023-12-24 NOTE — Patient Instructions (Addendum)
 Preventative Services:  Health Risk Assessment and Personalized Prevention Plan: done today Bone Mass Measurements: N/A Breast Cancer Screening: ordered today CVD Screening: ordered today Cervical Cancer Screening: N/A Colon Cancer Screening: ordered today Depression Screening: done today Diabetes Screening: done today Glaucoma Screening: see your eye doctor Hepatitis B vaccine: checked today Hepatitis C screening: up to date HIV Screening: up to date Flu Vaccine:declined Lung cancer Screening: N/A Obesity Screening: done today Pneumonia Vaccines (2): up to date STI Screening: N/A  Please call to schedule your mammogram and/or bone density: [facility]  Address: [address], [place], [place] [address] Phone: [phone]  [facility] [address] [place],  [place]  [address] Phone: [phone]    [name] , Thank you for taking time to come for your Medicare Wellness Visit. I appreciate your ongoing commitment to your health goals. Please review the following plan we discussed and let me know if I can assist you in the future.   These are the goals we discussed:  Goals      DIET - EAT MORE FRUITS AND VEGETABLES     SW-Track and Manage My Symptoms-Depression     Timeframe:  Long-Range Goal Priority:  Medium Start Date:  03/03/20                       Expected End Date:  06/01/20                     Follow Up Date-90 days from 03/03/20   - avoid negative self-talk - develop a personal safety plan - develop a plan to deal with triggers like holidays, anniversaries - exercise at least 2 to 3 times per week - have a plan for how to handle bad days - journal feelings and what helps to feel better or worse - spend time or talk with others at least 2 to 3 times per week - spend time or talk with others every day - watch for early signs of feeling worse - write in journal every day     Why is this important?   Keeping track of your progress will help your treatment team find the right mix of medicine and therapy for you.  Write in your journal every day.  Day-to-day changes in depression symptoms are normal. It may be more helpful to check your progress at the end of each week instead of every day.      Depression screen [facility] 2/9 01/15/2020 01/01/2020 10/15/2019  Decreased Interest 0 3 0  Down, Depressed, Hopeless 0 3 0  PHQ - 2 Score 0 6 0  Altered sleeping - 3 -  Tired, decreased energy - 3 -  Change in appetite - 3 -  Feeling bad or failure about yourself  - 3 -  Trouble concentrating - 3 -  Moving slowly or fidgety/restless - 3 -  Suicidal thoughts - 0 -  PHQ-9 Score - 24 -  Difficult doing work/chores - Very difficult -  Some recent data might be hidden    GAD 7 : Generalized Anxiety Score 12/15/2016  Nervous, Anxious, on Edge 2  Control/stop worrying 2  Worry too much - different things 3  Trouble relaxing 2  Restless 0  Easily annoyed or irritable 2  Afraid - awful might happen 2  Total GAD 7 Score 13  Anxiety Difficulty Extremely difficult   Assessment,  progress and current barriers:  Patient is currently experiencing symptoms of anxiety and depression which seems to be exacerbated by her own medical conditions. Lacks knowledge of where and how to connect mental health support. Support, Education, and Care Coordination will be provided in order to meet unmet need.   Clinical Goal(s): Over the next 120 days, patient will work with SW to reduce or manage symptoms of agitation, anxiety, depression, and mood instability until connected for ongoing counseling.  Over the next 120 days, patient will work with CCM LCSW to address needs related to find mental health support Clinical Interventions:  Assessed thoughts of SI, plan and access to means. Assessed patient's previous treatment, needs, coping skills, current treatment, support system and barriers to  care Patient interviewed and appropriate assessments performed: brief mental health assessment Provided basic mental health support, education and interventions to patient on 03/03/20. Patient had sent multiple text messages to the CCM RNCM asking for Dr. Vicci to call her as she could not figure out how to do the my chart visit. Patient is in need of changing her DM medications. Patient was very upset that she missed this appointment and was offered emotional support and redirection. CCM LCSW sent message to PCP and office visit was scheduled for today. Patient attended this appointment successfully.  Collaborated with CCM team and PCP regarding patient needs Discussed several options for long term counseling based on need and insurance. Assisted patient with narrowing the options down to [employer], [facility] or [employer].  Patient interviewed and appropriate assessments performed Provided mental health counseling with regard to anxiety and stress management Assisted patient/caregiver with obtaining information about health plan benefits Provided education and assistance to client regarding Advanced Directives. Encouraged patient to consider a mental health provider for long term follow up and therapy/counseling Other interventions include: Solution-Focused Strategies  Collaboration with PCP regarding development and update of comprehensive plan of care as evidenced by provider attestation and co-signature Inter-disciplinary care team collaboration (see longitudinal plan of care) 1:1 collaboration with PCP regarding development and update of comprehensive plan of care as evidenced by provider attestation and co-signature   Follow Up Plan:  SW will follow up with patient by phone over the next quarter         This is a list of the screening recommended for you and due dates:  Health Maintenance  Topic Date Due   Hepatitis B Vaccine (1 of 3 - 19+ 3-dose series) Never done    HPV Vaccine (1 - Risk 3-dose SCDM series) Never done   Breast Cancer Screening  05/25/2022   Medicare Annual Wellness Visit  06/20/2023   Colon Cancer Screening  Never done   Yearly kidney health urinalysis for diabetes  12/19/2023   Flu Shot  06/10/2024*   Pap with HPV screening  07/02/2024*   Hemoglobin A1C  01/02/2024   Eye exam for diabetics  04/10/2024   Yearly kidney function blood test for diabetes  07/02/2024   Complete foot exam   12/23/2024   DTaP/Tdap/Td vaccine (2 - Td or Tdap) 01/04/2026   Hepatitis C Screening  Completed   HIV Screening  Addressed   Meningitis B Vaccine  Aged Out   Pneumococcal Vaccine  Discontinued   COVID-19 Vaccine  Discontinued  *Topic was postponed. The date shown is not the original due date.

## 2023-12-25 ENCOUNTER — Other Ambulatory Visit: Payer: Self-pay

## 2023-12-25 ENCOUNTER — Other Ambulatory Visit: Payer: Self-pay | Admitting: Family Medicine

## 2023-12-25 LAB — COMPREHENSIVE METABOLIC PANEL WITH GFR
ALT: 25 IU/L (ref 0–32)
AST: 40 IU/L (ref 0–40)
Albumin: 4.2 g/dL (ref 3.9–4.9)
Alkaline Phosphatase: 69 IU/L (ref 41–116)
BUN/Creatinine Ratio: 10 (ref 9–23)
BUN: 8 mg/dL (ref 6–24)
Bilirubin Total: 0.4 mg/dL (ref 0.0–1.2)
CO2: 25 mmol/L (ref 20–29)
Calcium: 8.7 mg/dL (ref 8.7–10.2)
Chloride: 103 mmol/L (ref 96–106)
Creatinine, Ser: 0.77 mg/dL (ref 0.57–1.00)
Globulin, Total: 2.8 g/dL (ref 1.5–4.5)
Glucose: 89 mg/dL (ref 70–99)
Potassium: 4.1 mmol/L (ref 3.5–5.2)
Sodium: 141 mmol/L (ref 134–144)
Total Protein: 7 g/dL (ref 6.0–8.5)
eGFR: 97 mL/min/1.73 (ref >59)

## 2023-12-25 LAB — CBC WITH DIFFERENTIAL/PLATELET
Basophils Absolute: 0 x10E3/uL (ref 0.0–0.2)
Basos: 1 %
EOS (ABSOLUTE): 0.1 x10E3/uL (ref 0.0–0.4)
Eos: 2 %
Hematocrit: 38.6 % (ref 34.0–46.6)
Hemoglobin: 12.8 g/dL (ref 11.1–15.9)
Immature Grans (Abs): 0 x10E3/uL (ref 0.0–0.1)
Immature Granulocytes: 0 %
Lymphocytes Absolute: 2.8 x10E3/uL (ref 0.7–3.1)
Lymphs: 35 %
MCH: 28.6 pg (ref 26.6–33.0)
MCHC: 33.2 g/dL (ref 31.5–35.7)
MCV: 86 fL (ref 79–97)
Monocytes Absolute: 0.5 x10E3/uL (ref 0.1–0.9)
Monocytes: 6 %
Neutrophils Absolute: 4.6 x10E3/uL (ref 1.4–7.0)
Neutrophils: 56 %
Platelets: 307 x10E3/uL (ref 150–450)
RBC: 4.48 x10E6/uL (ref 3.77–5.28)
RDW: 12.8 % (ref 11.7–15.4)
WBC: 8 x10E3/uL (ref 3.4–10.8)

## 2023-12-25 LAB — FSH/LH
FSH: 5.7 m[IU]/mL
LH: 6.3 m[IU]/mL

## 2023-12-25 LAB — IRON AND TIBC
Iron Saturation: 20 % (ref 15–55)
Iron: 67 ug/dL (ref 27–159)
Total Iron Binding Capacity: 334 ug/dL (ref 250–450)
UIBC: 267 ug/dL (ref 131–425)

## 2023-12-25 LAB — LIPID PANEL W/O CHOL/HDL RATIO
Cholesterol, Total: 166 mg/dL (ref 100–199)
HDL: 35 mg/dL — ABNORMAL LOW (ref >39)
LDL Chol Calc (NIH): 95 mg/dL (ref 0–99)
Triglycerides: 213 mg/dL — ABNORMAL HIGH (ref 0–149)
VLDL Cholesterol Cal: 36 mg/dL (ref 5–40)

## 2023-12-25 LAB — FERRITIN: Ferritin: 86 ng/mL (ref 15–150)

## 2023-12-25 LAB — TSH: TSH: 2.8 u[IU]/mL (ref 0.450–4.500)

## 2023-12-25 LAB — VITAMIN D 25 HYDROXY (VIT D DEFICIENCY, FRACTURES): Vit D, 25-Hydroxy: 33.8 ng/mL (ref 30.0–100.0)

## 2023-12-25 LAB — ESTRADIOL: Estradiol: 143 pg/mL

## 2023-12-25 LAB — VITAMIN B12: Vitamin B-12: 318 pg/mL (ref 232–1245)

## 2023-12-25 LAB — HEPATITIS B SURFACE ANTIBODY, QUANTITATIVE: Hepatitis B Surf Ab Quant: <3.5 m[IU]/mL — ABNORMAL LOW

## 2023-12-25 MED ORDER — EPINEPHRINE 0.3 MG/0.3ML IJ SOAJ: 0.3000 mg | INTRAMUSCULAR | 6 refills | Status: DC | PRN

## 2023-12-25 MED ORDER — EPINEPHRINE 0.3 MG/0.3ML IJ SOAJ: 0.3000 mg | INTRAMUSCULAR | 6 refills | Status: AC | PRN

## 2023-12-25 NOTE — Telephone Encounter (Signed)
 Copied from CRM [id number]. Topic: Clinical - Prescription Issue >> Dec 25, 2023 10:48 AM Lonell PEDLAR wrote: Reason for CRM: Rx for Epipen  must be sent as a quantity of 2, Epipen  come in packs of 2, pharmacy unable to separate

## 2023-12-25 NOTE — Addendum Note (Signed)
 Addended by: NELWYN LAYMON SAILOR on: 12/25/2023 11:35 AM   Modules accepted: Orders

## 2023-12-25 NOTE — Telephone Encounter (Signed)
 Routing to provider to resend. RX t'd up with quantity change to 2 instead of 1.

## 2023-12-27 ENCOUNTER — Ambulatory Visit: Admitting: Family Medicine

## 2023-12-28 ENCOUNTER — Ambulatory Visit: Payer: Self-pay | Admitting: Family Medicine

## 2023-12-29 DIAGNOSIS — G894 Chronic pain syndrome: Secondary | ICD-10-CM | POA: Diagnosis not present

## 2023-12-29 DIAGNOSIS — R5382 Chronic fatigue, unspecified: Secondary | ICD-10-CM | POA: Diagnosis not present

## 2023-12-29 DIAGNOSIS — M47816 Spondylosis without myelopathy or radiculopathy, lumbar region: Secondary | ICD-10-CM | POA: Diagnosis not present

## 2023-12-30 NOTE — Progress Notes (Signed)
 Subjective:   Sharon Rodriguez is a 43 y.o. female who presents for Medicare Annual (Subsequent) preventive examination.  Visit Complete: In person  Patient Medicare AWV questionnaire was completed by the patient on 12/24/23; I have confirmed that all information answered by patient is correct and no changes since this date.        Objective:    Today's Vitals   12/24/23 1547  BP: 106/73  Pulse: 73  Temp: 98.1 F (36.7 C)  TempSrc: Oral  SpO2: 98%  Weight: 173 lb (78.5 kg)  Height: 5' 1 (1.549 m)   Body mass index is 32.69 kg/m.     12/20/2023   11:09 AM 09/18/2023   10:36 AM 06/21/2023   10:49 AM 12/26/2022   10:22 AM 09/28/2022   10:36 AM 06/20/2022    1:33 PM 03/30/2022   10:54 AM  Advanced Directives  Does Patient Have a Medical Advance Directive? No No No No No No No  Would patient like information on creating a medical advance directive?  No - Patient declined    No - Patient declined Yes (MAU/Ambulatory/Procedural Areas - Information given)    Current Medications (verified) Outpatient Encounter Medications as of 12/24/2023  Medication Sig   albuterol  (VENTOLIN  HFA) 108 (90 Base) MCG/ACT inhaler Inhale 1 puff into the lungs every 6 (six) hours as needed for wheezing or shortness of breath.   amphetamine-dextroamphetamine (ADDERALL) 30 MG tablet Take 1 tablet by mouth 2 (two) times daily.   Blood Glucose Monitoring Suppl (ONE TOUCH ULTRA 2) w/Device KIT 1 each by Does not apply route daily.   Continuous Glucose Sensor (FREESTYLE LIBRE 3 SENSOR) MISC 1 each by Does not apply route every 14 (fourteen) days. Place 1 sensor on the skin every 14 days. Use to check glucose continuously   glucose blood (ONETOUCH ULTRA) test strip 1 each by Other route daily. Use as instructed   Insulin Pen Needle (PEN NEEDLES) 32G X 5 MM MISC 1 each by Does not apply route once a week.   Lancets (ONETOUCH ULTRASOFT) lancets 1 each by Other route daily. Use as instructed   levothyroxine   (SYNTHROID ) 75 MCG tablet Take 1 tablet (75 mcg total) by mouth daily before breakfast.   methocarbamol  (ROBAXIN ) 500 MG tablet Take 500 mg by mouth 4 (four) times daily.   ondansetron  (ZOFRAN ) 4 MG tablet Take 1 tablet (4 mg total) by mouth every 8 (eight) hours as needed.   oxyCODONE -acetaminophen  (PERCOCET) 5-325 MG tablet Take 1 tablet by mouth every 6 (six) hours as needed for severe pain (pain score 7-10). Must last 30 days.   [START ON 01/23/2024] oxyCODONE -acetaminophen  (PERCOCET) 5-325 MG tablet Take 1 tablet by mouth every 6 (six) hours as needed for severe pain (pain score 7-10). Must last 30 days.   [START ON 02/22/2024] oxyCODONE -acetaminophen  (PERCOCET) 5-325 MG tablet Take 1 tablet by mouth every 6 (six) hours as needed for severe pain (pain score 7-10). Must last 30 days.   Semaglutide , 1 MG/DOSE, (OZEMPIC , 1 MG/DOSE,) 4 MG/3ML SOPN Inject 1 mg into the skin once a week.   Vitamin D , Ergocalciferol , (DRISDOL ) 1.25 MG (50000 UNIT) CAPS capsule Take 1 capsule (50,000 Units total) by mouth every 7 (seven) days.   [DISCONTINUED] albuterol  (VENTOLIN  HFA) 108 (90 Base) MCG/ACT inhaler Inhale 1 puff into the lungs every 6 (six) hours as needed for wheezing or shortness of breath.   [DISCONTINUED] EPINEPHrine  (EPIPEN  2-PAK) 0.3 mg/0.3 mL IJ SOAJ injection Inject 0.3 mg into the  muscle as needed for anaphylaxis (Pateint is a Systems developer.).   [DISCONTINUED] EPINEPHrine  (EPIPEN  2-PAK) 0.3 mg/0.3 mL IJ SOAJ injection Inject 0.3 mg into the muscle as needed for anaphylaxis (Pateint is a Systems developer.).   [DISCONTINUED] ondansetron  (ZOFRAN ) 4 MG tablet TAKE 1 TABLET BY MOUTH EVERY 8 HOURS AS NEEDED FOR NAUSEA OR VOMITING   [DISCONTINUED] Semaglutide , 1 MG/DOSE, (OZEMPIC , 1 MG/DOSE,) 4 MG/3ML SOPN Inject 1 mg into the skin once a week.   No facility-administered encounter medications on file as of 12/24/2023.    Allergies (verified) Ibuprofen, Metformin  and related, and Promethazine    History: Past Medical History:  Diagnosis Date   ADD (attention deficit disorder)    Anxiety    Asthma    WELL CONTROLLED   Biliary dyskinesia 12/27/2017   Chronic bronchitis (HCC)    Chronic fatigue    Chronic pain    Depression    Diabetes mellitus without complication (HCC)    Fibromyalgia    GERD (gastroesophageal reflux disease)    History of stomach ulcers 2019   Hypothyroidism 01/27/2015   Learning disability    Osteoarthritis    Rheumatoid arthritis (HCC)    Thyroid  disease    TMJ (dislocation of temporomandibular joint)    Past Surgical History:  Procedure Laterality Date   CESAREAN SECTION     x 2   CHOLECYSTECTOMY N/A 01/14/2018   Procedure: LAPAROSCOPIC CHOLECYSTECTOMY;  Surgeon: Nicholaus Selinda Birmingham, MD;  Location: [facility] ORS;  Service: General;  Laterality: N/A;   ESOPHAGOGASTRODUODENOSCOPY (EGD) WITH PROPOFOL  N/A 12/07/2017   Procedure: ESOPHAGOGASTRODUODENOSCOPY (EGD) WITH PROPOFOL ;  Surgeon: Therisa Bi, MD;  Location: [facility] ENDOSCOPY;  Service: Gastroenterology;  Laterality: N/A;   Family History  Problem Relation Age of Onset   Diabetes Mother    ADD / ADHD Mother    Learning disabilities Mother    COPD Mother    Diabetes Father    Hyperlipidemia Father    Mental illness Father        Depression/anxiety   Mental illness Sister        Depression/Anxiety   Arthritis Brother        Rheumatoid   Mental illness Daughter        anxiety   Thyroid  disease Daughter    ADD / ADHD Daughter    Allergies Daughter    Cancer Maternal Grandmother        Skin, Lung   Heart disease Maternal Grandfather    Stroke Maternal Grandfather    Heart attack Maternal Grandfather    Arthritis Paternal Grandmother        Rheumatoid   Dementia Paternal Grandmother    Allergies Daughter    Social History   Socioeconomic History   Marital status: Married    Spouse name: Not on file   Number of children: Not on file   Years of education: Not on file   Highest  education level: Not on file  Occupational History   Not on file  Tobacco Use   Smoking status: Never   Smokeless tobacco: Never  Vaping Use   Vaping status: Never Used  Substance and Sexual Activity   Alcohol use: No   Drug use: No   Sexual activity: Not Currently    Birth control/protection: None  Other Topics Concern   Not on file  Social History Narrative   Not on file   Social Drivers of Health   Financial Resource Strain: Low Risk  (06/20/2022)   Overall Financial Resource  Strain (CARDIA)    Difficulty of Paying Living Expenses: Not very hard  Food Insecurity: No Food Insecurity (12/24/2023)   Hunger Vital Sign    Worried About Running Out of Food in the Last Year: Never true    Ran Out of Food in the Last Year: Never true  Transportation Needs: No Transportation Needs (12/24/2023)   PRAPARE - Administrator, Civil Service (Medical): No    Lack of Transportation (Non-Medical): No  Physical Activity: Insufficiently Active (06/20/2022)   Exercise Vital Sign    Days of Exercise per Week: 2 days    Minutes of Exercise per Session: 20 min  Stress: No Stress Concern Present (06/20/2022)   [employer] of Occupational Health - Occupational Stress Questionnaire    Feeling of Stress : Only a little  Social Connections: Socially Integrated (06/20/2022)   Social Connection and Isolation Panel    Frequency of Communication with Friends and Family: More than three times a week    Frequency of Social Gatherings with Friends and Family: Never    Attends Religious Services: More than 4 times per year    Active Member of [employer] or Organizations: Yes    Attends Engineer, structural: More than 4 times per year    Marital Status: Married    Tobacco Counseling Counseling given: Not Answered   Clinical Intake:                        Activities of Daily Living    12/24/2023    4:04 PM  In your present state of health, do you have any difficulty  performing the following activities:  Hearing? 1  Vision? 1  Difficulty concentrating or making decisions? 1  Walking or climbing stairs? 1  Dressing or bathing? 0  Doing errands, shopping? 1    Patient Care Team: Vicci Duwaine SQUIBB, DO as PCP - General (Family Medicine) Freeman Norris, CRNP as Nurse Practitioner (Rheumatology) Daniel Lauth, MD as Referring Physician (Psychiatry) Merlynn Lyle CROME, LCSW as [facility] Care Management (Licensed Clinical Social Worker) [place], [facility]  Indicate any recent Medical Services you may have received from other than [facility] providers in the past year (date may be approximate).     Assessment:   This is a routine wellness examination for [place].  Hearing/Vision screen No results found.   Goals Addressed   None    Depression Screen    12/24/2023    4:05 PM 12/20/2023   11:09 AM 09/18/2023   10:36 AM 06/21/2023   10:49 AM 12/26/2022   10:22 AM 10/27/2022   11:29 AM 09/28/2022   10:36 AM  PHQ 2/9 Scores  PHQ - 2 Score 5 0 0 0 0 3 0  PHQ- 9 Score 16     12     Fall Risk    12/24/2023    4:05 PM 12/20/2023   11:09 AM 09/18/2023   10:35 AM 07/03/2023    3:46 PM 06/21/2023   10:49 AM  Fall Risk   Falls in the past year? 0 0 0 0 0  Number falls in past yr: 0 0 0 0   Injury with Fall? 0 0 0 0   Risk for fall due to : No Fall Risks   No Fall Risks   Follow up Falls evaluation completed   Falls evaluation completed     MEDICARE RISK AT HOME:    TIMED UP AND  GO:  Was the test performed?  Yes  Length of time to ambulate 10 feet: 8 sec Gait steady and fast without use of assistive device    Cognitive Function:        12/24/2023    4:07 PM 06/20/2022    1:42 PM 06/25/2017    1:38 PM  6CIT Screen  What Year? 4 points 0 points 0 points  What month? 0 points 0 points 0 points  What time? 0 points 0 points 0 points  Count back from 20 0 points 0 points 0 points  Months in reverse 4 points 0 points 0 points   Repeat phrase 4 points 0 points 2 points  Total Score 12 points 0 points 2 points    Immunizations Immunization History  Administered Date(s) Administered   Influenza,inj,Quad PF,6+ Mos 01/05/2016, 12/05/2016, 01/24/2018, 01/01/2020   Influenza-Unspecified 01/05/2016   Pneumococcal Polysaccharide-23 01/01/2020   Tdap 01/05/2016    TDAP status: Up to date  Flu Vaccine status: Declined, Education has been provided regarding the importance of this vaccine but patient still declined. Advised may receive this vaccine at local pharmacy or Health Dept. Aware to provide a copy of the vaccination record if obtained from local pharmacy or Health Dept. Verbalized acceptance and understanding.  Pneumococcal vaccine status: Declined,  Education has been provided regarding the importance of this vaccine but patient still declined. Advised may receive this vaccine at local pharmacy or Health Dept. Aware to provide a copy of the vaccination record if obtained from local pharmacy or Health Dept. Verbalized acceptance and understanding.   Covid-19 vaccine status: Declined, Education has been provided regarding the importance of this vaccine but patient still declined. Advised may receive this vaccine at local pharmacy or Health Dept.or vaccine clinic. Aware to provide a copy of the vaccination record if obtained from local pharmacy or Health Dept. Verbalized acceptance and understanding.  Qualifies for Shingles Vaccine? No   Zostavax completed No     Screening Tests Health Maintenance  Topic Date Due   Hepatitis B Vaccines 19-59 Average Risk (1 of 3 - 19+ 3-dose series) Never done   HPV VACCINES (1 - Risk 3-dose SCDM series) Never done   Mammogram  05/25/2022   Medicare Annual Wellness (AWV)  06/20/2023   Colonoscopy  Never done   Diabetic kidney evaluation - Urine ACR  12/19/2023   Influenza Vaccine  06/10/2024 (Originally 10/12/2023)   Cervical Cancer Screening (HPV/Pap Cotest)  07/02/2024  (Originally 03/28/2022)   OPHTHALMOLOGY EXAM  04/10/2024   HEMOGLOBIN A1C  06/23/2024   Diabetic kidney evaluation - eGFR measurement  12/23/2024   FOOT EXAM  12/23/2024   DTaP/Tdap/Td (2 - Td or Tdap) 01/04/2026   Hepatitis C Screening  Completed   HIV Screening  Addressed   Meningococcal B Vaccine  Aged Out   Pneumococcal Vaccine  Discontinued   COVID-19 Vaccine  Discontinued    Health Maintenance  Health Maintenance Due  Topic Date Due   Hepatitis B Vaccines 19-59 Average Risk (1 of 3 - 19+ 3-dose series) Never done   HPV VACCINES (1 - Risk 3-dose SCDM series) Never done   Mammogram  05/25/2022   Medicare Annual Wellness (AWV)  06/20/2023   Colonoscopy  Never done   Diabetic kidney evaluation - Urine ACR  12/19/2023    Colorectal cancer screening: Referral for cologuard placed today. Pt aware the office will call re: appt.  Mammogram status: Ordered today. Pt provided with contact info and advised to call to  schedule appt.    Lung Cancer Screening: (Low Dose CT Chest recommended if Age 69-80 years, 20 pack-year currently smoking OR have quit w/in 15years.) does not qualify.   Additional Screening:  Hepatitis C Screening: does qualify; Completed 04/16/20  Vision Screening: Recommended annual ophthalmology exams for early detection of glaucoma and other disorders of the eye. Is the patient up to date with their annual eye exam?  Yes  Who is the provider or what is the name of the office in which the patient attends annual eye exams? [facility]  Dental Screening: Recommended annual dental exams for proper oral hygiene  Diabetic Foot Exam: Diabetic Foot Exam: Completed 12/24/23  Community Resource Referral / Chronic Care Management: CRR required this visit?  No   CCM required this visit?  No     Plan:     I have personally reviewed and noted the following in the patient's chart:   Medical and social history Use of alcohol, tobacco or illicit drugs   Current medications and supplements including opioid prescriptions. Patient is currently taking opioid prescriptions. Information provided to patient regarding non-opioid alternatives. Patient advised to discuss non-opioid treatment plan with their provider. Functional ability and status Nutritional status Physical activity Advanced directives List of other physicians Hospitalizations, surgeries, and ER visits in previous 12 months Vitals Screenings to include cognitive, depression, and falls Referrals and appointments  In addition, I have reviewed and discussed with patient certain preventive protocols, quality metrics, and best practice recommendations. A written personalized care plan for preventive services as well as general preventive health recommendations were provided to patient.     Duwaine Louder, DO   12/30/2023   After Visit Summary: (In Person-Printed) AVS printed and given to the patient

## 2023-12-30 NOTE — Assessment & Plan Note (Signed)
 Continue to follow with psychiatry. Call with any concerns. Continue to monitor.

## 2023-12-30 NOTE — Assessment & Plan Note (Signed)
 Under good control on current regimen. Continue current regimen. Continue to monitor. Call with any concerns. Refills given.

## 2023-12-30 NOTE — Assessment & Plan Note (Signed)
 Doing well with A1c of 5.8. Continue current regimen. Call with any concerns.

## 2023-12-30 NOTE — Assessment & Plan Note (Signed)
Continue to follow with rheumatology. Call with any concerns. Continue to monitor.  

## 2023-12-30 NOTE — Assessment & Plan Note (Signed)
 Rechecking labs today. Await results. Treat as needed.

## 2023-12-31 DIAGNOSIS — R5382 Chronic fatigue, unspecified: Secondary | ICD-10-CM | POA: Diagnosis not present

## 2023-12-31 DIAGNOSIS — M47816 Spondylosis without myelopathy or radiculopathy, lumbar region: Secondary | ICD-10-CM | POA: Diagnosis not present

## 2023-12-31 DIAGNOSIS — G894 Chronic pain syndrome: Secondary | ICD-10-CM | POA: Diagnosis not present

## 2024-01-03 ENCOUNTER — Ambulatory Visit: Admitting: Family Medicine

## 2024-01-07 ENCOUNTER — Encounter: Payer: Self-pay | Admitting: Family Medicine

## 2024-01-07 ENCOUNTER — Ambulatory Visit (INDEPENDENT_AMBULATORY_CARE_PROVIDER_SITE_OTHER): Admitting: Family Medicine

## 2024-01-07 VITALS — BP 122/80 | HR 91 | Temp 98.5°F | Ht 61.0 in

## 2024-01-07 DIAGNOSIS — M9901 Segmental and somatic dysfunction of cervical region: Secondary | ICD-10-CM

## 2024-01-07 DIAGNOSIS — M9909 Segmental and somatic dysfunction of abdomen and other regions: Secondary | ICD-10-CM

## 2024-01-07 DIAGNOSIS — E1139 Type 2 diabetes mellitus with other diabetic ophthalmic complication: Secondary | ICD-10-CM

## 2024-01-07 DIAGNOSIS — M9905 Segmental and somatic dysfunction of pelvic region: Secondary | ICD-10-CM | POA: Diagnosis not present

## 2024-01-07 DIAGNOSIS — M9900 Segmental and somatic dysfunction of head region: Secondary | ICD-10-CM

## 2024-01-07 DIAGNOSIS — M9903 Segmental and somatic dysfunction of lumbar region: Secondary | ICD-10-CM | POA: Diagnosis not present

## 2024-01-07 DIAGNOSIS — M9908 Segmental and somatic dysfunction of rib cage: Secondary | ICD-10-CM | POA: Diagnosis not present

## 2024-01-07 DIAGNOSIS — M9904 Segmental and somatic dysfunction of sacral region: Secondary | ICD-10-CM | POA: Diagnosis not present

## 2024-01-07 DIAGNOSIS — R519 Headache, unspecified: Secondary | ICD-10-CM | POA: Diagnosis not present

## 2024-01-07 DIAGNOSIS — M9902 Segmental and somatic dysfunction of thoracic region: Secondary | ICD-10-CM | POA: Diagnosis not present

## 2024-01-07 LAB — MICROALBUMIN, URINE WAIVED
Creatinine, Urine Waived: 300 mg/dL (ref 10–300)
Microalb, Ur Waived: 150 mg/L — ABNORMAL HIGH (ref 0–19)

## 2024-01-07 NOTE — Progress Notes (Signed)
 BP 122/80   Pulse 91   Temp 98.5 F (36.9 C) (Oral)   Ht 5' 1 (1.549 m)   SpO2 100%   BMI 32.69 kg/m    Subjective:    Patient ID: Sharon Rodriguez, female    DOB: November 11, 1980, 43 y.o.   MRN: [id number]  HPI: Sharon Rodriguez is a 43 y.o. female  Chief Complaint  Patient presents with   Headache   Prentiss presents today for evaluation and possible treatment with OMT for headache. She notes that she did well following her last appointment and that her headache started a couple of days ago. Pain is aching and sore. Better with rest and OMT. Worse with certain movements. No radiation. No other concerns or complaints at this time.   Relevant past medical, surgical, family and social history reviewed and updated as indicated. Interim medical history since our last visit reviewed. Allergies and medications reviewed and updated.  Review of Systems  Constitutional: Negative.   Respiratory: Negative.    Cardiovascular: Negative.   Musculoskeletal:  Positive for arthralgias, back pain, myalgias, neck pain and neck stiffness. Negative for gait problem and joint swelling.  Skin: Negative.   Neurological:  Positive for headaches. Negative for dizziness, tremors, seizures, syncope, facial asymmetry, speech difficulty, weakness, light-headedness and numbness.  Psychiatric/Behavioral: Negative.      Per HPI unless specifically indicated above     Objective:    BP 122/80   Pulse 91   Temp 98.5 F (36.9 C) (Oral)   Ht 5' 1 (1.549 m)   SpO2 100%   BMI 32.69 kg/m   Wt Readings from Last 3 Encounters:  12/24/23 173 lb (78.5 kg)  12/20/23 175 lb (79.4 kg)  09/24/23 170 lb (77.1 kg)    Physical Exam Vitals and nursing note reviewed.  Constitutional:      General: She is not in acute distress.    Appearance: Normal appearance. She is not ill-appearing.  HENT:     Head: Normocephalic and atraumatic.     Right Ear: External ear normal.     Left Ear: External ear normal.      Nose: Nose normal.     Mouth/Throat:     Mouth: Mucous membranes are moist.     Pharynx: Oropharynx is clear.  Eyes:     Extraocular Movements: Extraocular movements intact.     Conjunctiva/sclera: Conjunctivae normal.     Pupils: Pupils are equal, round, and reactive to light.  Neck:     Vascular: No carotid bruit.  Cardiovascular:     Rate and Rhythm: Normal rate.     Pulses: Normal pulses.  Pulmonary:     Effort: Pulmonary effort is normal. No respiratory distress.  Abdominal:     General: Abdomen is flat. There is no distension.     Palpations: Abdomen is soft. There is no mass.     Tenderness: There is no abdominal tenderness. There is no right CVA tenderness, left CVA tenderness, guarding or rebound.     Hernia: No hernia is present.  Musculoskeletal:     Cervical back: No muscular tenderness.  Lymphadenopathy:     Cervical: No cervical adenopathy.  Skin:    General: Skin is warm and dry.     Capillary Refill: Capillary refill takes less than 2 seconds.     Coloration: Skin is not jaundiced or pale.     Findings: No bruising, erythema, lesion or rash.  Neurological:     General: No focal  deficit present.     Mental Status: She is alert. Mental status is at baseline.  Psychiatric:        Mood and Affect: Mood normal.        Behavior: Behavior normal.        Thought Content: Thought content normal.        Judgment: Judgment normal.    Musculoskeletal:  Exam found Decreased ROM, Tissue texture changes, Tenderness to palpation, and Asymmetry of patient's  head, neck, thorax, ribs, lumbar, pelvis, sacrum, and abdomen Osteopathic Structural Exam:   Head: OAESSR, hypertonic suboccipital muscles  Neck: C4ESRR, SCM hypertonic on the R  Thorax: T3-6SLRR  Ribs: Ribs 5-8 locked up on the L, Rib 6 locked up on the R  Lumbar: QL hypertonic on the R, L3-5SLRR  Pelvis: Posterior R innominate, SI joint restricted on the R  Sacrum: R on R torsion   Abdomen: diaphragm hypertonic  bilaterally R>L  Results for orders placed or performed in visit on 12/24/23  Bayer DCA Hb A1c Waived   Collection Time: 12/24/23  4:16 PM  Result Value Ref Range   HB A1C (BAYER DCA - WAIVED) 5.8 (H) 4.8 - 5.6 %  CBC with Differential/Platelet   Collection Time: 12/24/23  4:20 PM  Result Value Ref Range   WBC 8.0 3.4 - 10.8 x10E3/uL   RBC 4.48 3.77 - 5.28 x10E6/uL   Hemoglobin 12.8 11.1 - 15.9 g/dL   Hematocrit 61.3 65.9 - 46.6 %   MCV 86 79 - 97 fL   MCH 28.6 26.6 - 33.0 pg   MCHC 33.2 31.5 - 35.7 g/dL   RDW 87.1 88.2 - 84.5 %   Platelets 307 150 - 450 x10E3/uL   Neutrophils 56 Not Estab. %   Lymphs 35 Not Estab. %   Monocytes 6 Not Estab. %   Eos 2 Not Estab. %   Basos 1 Not Estab. %   Neutrophils Absolute 4.6 1.4 - 7.0 x10E3/uL   Lymphocytes Absolute 2.8 0.7 - 3.1 x10E3/uL   Monocytes Absolute 0.5 0.1 - 0.9 x10E3/uL   EOS (ABSOLUTE) 0.1 0.0 - 0.4 x10E3/uL   Basophils Absolute 0.0 0.0 - 0.2 x10E3/uL   Immature Granulocytes 0 Not Estab. %   Immature Grans (Abs) 0.0 0.0 - 0.1 x10E3/uL  Comprehensive metabolic panel with GFR   Collection Time: 12/24/23  4:20 PM  Result Value Ref Range   Glucose 89 70 - 99 mg/dL   BUN 8 6 - 24 mg/dL   Creatinine, Ser 9.22 0.57 - 1.00 mg/dL   eGFR 97 >40 fO/fpw/8.26   BUN/Creatinine Ratio 10 9 - 23   Sodium 141 134 - 144 mmol/L   Potassium 4.1 3.5 - 5.2 mmol/L   Chloride 103 96 - 106 mmol/L   CO2 25 20 - 29 mmol/L   Calcium 8.7 8.7 - 10.2 mg/dL   Total Protein 7.0 6.0 - 8.5 g/dL   Albumin 4.2 3.9 - 4.9 g/dL   Globulin, Total 2.8 1.5 - 4.5 g/dL   Bilirubin Total 0.4 0.0 - 1.2 mg/dL   Alkaline Phosphatase 69 41 - 116 IU/L   AST 40 0 - 40 IU/L   ALT 25 0 - 32 IU/L  Lipid Panel w/o Chol/HDL Ratio   Collection Time: 12/24/23  4:20 PM  Result Value Ref Range   Cholesterol, Total 166 100 - 199 mg/dL   Triglycerides 786 (H) 0 - 149 mg/dL   HDL 35 (L) >60 mg/dL   VLDL Cholesterol Cal 36  5 - 40 mg/dL   LDL Chol Calc (NIH) 95 0 - 99 mg/dL   TSH   Collection Time: 12/24/23  4:20 PM  Result Value Ref Range   TSH 2.800 0.450 - 4.500 uIU/mL  Ferritin   Collection Time: 12/24/23  4:20 PM  Result Value Ref Range   Ferritin 86 15 - 150 ng/mL  B12   Collection Time: 12/24/23  4:20 PM  Result Value Ref Range   Vitamin B-12 318 232 - 1,245 pg/mL  Iron Binding Cap (TIBC)([facility])   Collection Time: 12/24/23  4:20 PM  Result Value Ref Range   Total Iron Binding Capacity 334 250 - 450 ug/dL   UIBC 732 868 - 574 ug/dL   Iron 67 27 - 840 ug/dL   Iron Saturation 20 15 - 55 %  VITAMIN D  25 Hydroxy (Vit-D Deficiency, Fractures)   Collection Time: 12/24/23  4:20 PM  Result Value Ref Range   Vit D, 25-Hydroxy 33.8 30.0 - 100.0 ng/mL  FSH/LH   Collection Time: 12/24/23  4:20 PM  Result Value Ref Range   LH 6.3 mIU/mL   FSH 5.7 mIU/mL  Estradiol   Collection Time: 12/24/23  4:20 PM  Result Value Ref Range   Estradiol 143.0 pg/mL  Hepatitis B surface antibody,quantitative   Collection Time: 12/24/23  4:20 PM  Result Value Ref Range   Hepatitis B Surf Ab Quant <3.5 (L) Immunity>10 mIU/mL      Assessment & Plan:   Problem List Items Addressed This Visit       Endocrine   Diabetes mellitus (HCC)   Relevant Orders   Microalbumin, Urine Waived   Other Visit Diagnoses       Acute nonintractable headache, unspecified headache type    -  Primary   She does have somatic dysfunction that is contributing to her symptoms. Treated today with good results as below. Call with any concerns.     Head region somatic dysfunction         Cervical segment dysfunction         Thoracic segment dysfunction         Somatic dysfunction of lumbar region         Somatic dysfunction of sacral region         Somatic dysfunction of pelvis region         Rib cage region somatic dysfunction         Segmental dysfunction of abdomen           After verbal consent was obtained, patient was treated today with osteopathic manipulative  medicine to the regions of the head, neck, thorax, ribs, lumbar, pelvis, sacrum, and abdomen using the techniques of cranial, myofascial release, counterstrain, muscle energy, HVLA, and soft tissue. Areas of compensation relating to her primary pain source also treated. Patient tolerated the procedure well with good objective and good subjective improvement in symptoms. She left the room in good condition. She was advised to stay well hydrated and that she may have some soreness following the procedure. If not improving or worsening, she will call and come in. She will return for reevaluation  on a PRN basis.  Follow up plan: Return if symptoms worsen or fail to improve.

## 2024-01-29 ENCOUNTER — Telehealth: Payer: Self-pay | Admitting: Pharmacist

## 2024-01-29 NOTE — Progress Notes (Signed)
 Pharmacy Quality Measure Review  This patient is appearing on the insurance-providing list for being at risk of failing the adherence measure for Statin Use in Persons with Diabetes (SUPD) medications this calendar year.   PREVENT Risk Score: 10 year risk of CVD: 3.8% - 10 year risk of ASCVD: 2.3% - 10 year risk of HF: 1.5%  Consider moderate intensity statin therapy for primary prevention.   [facility], PharmD, [facility] Clinical Pharmacist [phone]

## 2024-02-25 ENCOUNTER — Encounter: Payer: Self-pay | Admitting: Family Medicine

## 2024-02-25 ENCOUNTER — Ambulatory Visit (INDEPENDENT_AMBULATORY_CARE_PROVIDER_SITE_OTHER): Admitting: Family Medicine

## 2024-02-25 VITALS — BP 134/77 | HR 92 | Temp 98.2°F | Ht 61.0 in | Wt 173.0 lb

## 2024-02-25 DIAGNOSIS — M9901 Segmental and somatic dysfunction of cervical region: Secondary | ICD-10-CM

## 2024-02-25 DIAGNOSIS — M9905 Segmental and somatic dysfunction of pelvic region: Secondary | ICD-10-CM | POA: Diagnosis not present

## 2024-02-25 DIAGNOSIS — M9904 Segmental and somatic dysfunction of sacral region: Secondary | ICD-10-CM | POA: Diagnosis not present

## 2024-02-25 DIAGNOSIS — G8929 Other chronic pain: Secondary | ICD-10-CM | POA: Diagnosis not present

## 2024-02-25 DIAGNOSIS — M9900 Segmental and somatic dysfunction of head region: Secondary | ICD-10-CM

## 2024-02-25 DIAGNOSIS — M9902 Segmental and somatic dysfunction of thoracic region: Secondary | ICD-10-CM | POA: Diagnosis not present

## 2024-02-25 DIAGNOSIS — R102 Pelvic and perineal pain unspecified side: Secondary | ICD-10-CM | POA: Diagnosis not present

## 2024-02-25 DIAGNOSIS — M9909 Segmental and somatic dysfunction of abdomen and other regions: Secondary | ICD-10-CM

## 2024-02-25 DIAGNOSIS — M9903 Segmental and somatic dysfunction of lumbar region: Secondary | ICD-10-CM

## 2024-02-25 NOTE — Assessment & Plan Note (Signed)
 In exacerbation. She does have somatic dysfunction that is contributing to her symptoms. Treated today with good results as below. She is interested in seeing chiropractry. Declines referral to PT today. Follow up as needed. Call with any concerns.

## 2024-02-25 NOTE — Progress Notes (Signed)
 BP 134/77   Pulse 92   Temp 98.2 F (36.8 C) (Oral)   Ht 5' 1 (1.549 m)   Wt 173 lb (78.5 kg)   SpO2 98%   BMI 32.69 kg/m    Subjective:    Patient ID: Sharon Rodriguez, female    DOB: 1980/10/20, 43 y.o.   MRN: [id number]  HPI: Sharon Rodriguez is a 43 y.o. female  Chief Complaint  Patient presents with   Back Pain   Sharon Rodriguez presents today for evaluation and possible treatment with OMT. She notes that her pelvis has been bothering her for a few weeks. More trouble walking. Physical activity makes it worse. OMT and heating pad make it better. Pain is aching and sore and radiates into her back and into her legs. She is otherwise doing well with no other concerns or complaints at this time.    Relevant past medical, surgical, family and social history reviewed and updated as indicated. Interim medical history since our last visit reviewed. Allergies and medications reviewed and updated.  Review of Systems  Constitutional: Negative.   Respiratory: Negative.    Cardiovascular: Negative.   Musculoskeletal:  Positive for arthralgias, back pain, gait problem and myalgias. Negative for joint swelling, neck pain and neck stiffness.  Skin: Negative.   Psychiatric/Behavioral: Negative.      Per HPI unless specifically indicated above     Objective:    BP 134/77   Pulse 92   Temp 98.2 F (36.8 C) (Oral)   Ht 5' 1 (1.549 m)   Wt 173 lb (78.5 kg)   SpO2 98%   BMI 32.69 kg/m   Wt Readings from Last 3 Encounters:  02/25/24 173 lb (78.5 kg)  12/24/23 173 lb (78.5 kg)  12/20/23 175 lb (79.4 kg)    Physical Exam Vitals and nursing note reviewed.  Constitutional:      General: She is not in acute distress.    Appearance: Normal appearance. She is not ill-appearing.  HENT:     Head: Normocephalic and atraumatic.     Right Ear: External ear normal.     Left Ear: External ear normal.     Nose: Nose normal.     Mouth/Throat:     Mouth: Mucous membranes are moist.      Pharynx: Oropharynx is clear.  Eyes:     Extraocular Movements: Extraocular movements intact.     Conjunctiva/sclera: Conjunctivae normal.     Pupils: Pupils are equal, round, and reactive to light.  Neck:     Vascular: No carotid bruit.  Cardiovascular:     Rate and Rhythm: Normal rate.     Pulses: Normal pulses.  Pulmonary:     Effort: Pulmonary effort is normal. No respiratory distress.     Breath sounds: No rhonchi.  Abdominal:     General: Abdomen is flat. There is no distension.     Palpations: Abdomen is soft. There is no mass.     Tenderness: There is no abdominal tenderness. There is no right CVA tenderness, left CVA tenderness, guarding or rebound.     Hernia: No hernia is present.  Musculoskeletal:     Cervical back: No muscular tenderness.  Lymphadenopathy:     Cervical: No cervical adenopathy.  Skin:    General: Skin is warm and dry.     Capillary Refill: Capillary refill takes less than 2 seconds.     Coloration: Skin is not jaundiced or pale.     Findings: No  bruising, erythema, lesion or rash.  Neurological:     General: No focal deficit present.     Mental Status: She is alert. Mental status is at baseline.  Psychiatric:        Mood and Affect: Mood normal.        Behavior: Behavior normal.        Thought Content: Thought content normal.        Judgment: Judgment normal.    Musculoskeletal:  Exam found Decreased ROM, Tissue texture changes, Tenderness to palpation, and Asymmetry of patient's  head, neck, thorax, ribs, lumbar, pelvis, sacrum, and abdomen Osteopathic Structural Exam:   Head: OAESSR, SBS SRR, hypertonic suboccipital muscles  Neck: C3ESRR, C4ESRR, SCM hypertonic on the R  Thorax: T3-5 SLRR  Ribs: Ribs 5-7 locked up on the R, Ribs 3-5 and 7-9 locked up on the L  Lumbar: QL hypertonic bilaterally, psoas hypertonic bilaterally  Pelvis: Posterior L innominate  Sacrum: L on L torsion  Abdomen: diaphragm hypertonic bilaterally R>L  Results for  orders placed or performed in visit on 01/07/24  Microalbumin, Urine Waived   Collection Time: 01/07/24  2:27 PM  Result Value Ref Range   Microalb, Ur Waived 150 (H) 0 - 19 mg/L   Creatinine, Urine Waived 300 10 - 300 mg/dL   Microalb/Creat Ratio 30-300 (H) <30 mg/g      Assessment & Plan:   Problem List Items Addressed This Visit       Other   Chronic pelvic pain in female - Primary   In exacerbation. She does have somatic dysfunction that is contributing to her symptoms. Treated today with good results as below. She is interested in seeing chiropractry. Declines referral to PT today. Follow up as needed. Call with any concerns.       Relevant Orders   Ambulatory referral to Chiropractic   Other Visit Diagnoses       Head region somatic dysfunction         Cervical segment dysfunction         Thoracic segment dysfunction         Somatic dysfunction of lumbar region         Somatic dysfunction of sacral region         Somatic dysfunction of pelvis region         Segmental dysfunction of abdomen          After verbal consent was obtained, patient was treated today with osteopathic manipulative medicine to the regions of the head, neck, thorax, ribs, lumbar, pelvis, sacrum, and abdomen using the techniques of cranial, myofascial release, counterstrain, muscle energy, HVLA, and soft tissue. Areas of compensation relating to her primary pain source also treated. Patient tolerated the procedure well with good objective and good subjective improvement in symptoms. She left the room in good condition. She was advised to stay well hydrated and that she may have some soreness following the procedure. If not improving or worsening, she will call and come in. She will return for reevaluation  on a PRN basis.   Follow up plan: Return if symptoms worsen or fail to improve.

## 2024-03-14 ENCOUNTER — Encounter: Payer: Self-pay | Admitting: Family Medicine

## 2024-03-14 NOTE — Progress Notes (Signed)
 PROVIDER NOTE: Interpretation of information contained herein should be left to medically-trained personnel. Specific patient instructions are provided elsewhere under Patient Instructions section of medical record. This document was created in part using AI and STT-dictation technology, any transcriptional errors that may result from this process are unintentional.  Patient: Sharon Rodriguez  Service: E/M   PCP: Vicci Duwaine SQUIBB, DO  DOB: 09-09-1980  DOS: 03/17/2024  Provider: Emmy MARLA Blanch, NP  MRN: [id number]  Delivery: Face-to-face  Specialty: Interventional Pain Management  Type: Established Patient  Setting: Ambulatory outpatient facility  Specialty designation: 09  Referring Prov.: Vicci Duwaine SQUIBB, DO  Location: Outpatient office facility       History of present illness (HPI) [name], a 44 y.o. year old female, is here today because of her bilateral low back pain. Sharon Rodriguez primary complain today is Back Pain  Pertinent problems: Sharon Rodriguez has Anxiety; Rheumatoid arthritis (HCC); Fibromyalgia; TMJ pain dysfunction syndrome; Chronic bronchitis (HCC); Chronic pelvic pain in female; Medication management; H/O juvenile rheumatoid arthritis; Spondylosis without myelopathy or radiculopathy, lumbar region; Myofascial pain; Pelvic pain in female; Chronic bilateral low back pain without sciatica; Chronic pain syndrome; Long term current use of opiate analgesic; Gastroesophageal reflux disease; and Diabetes mellitus (HCC) on their pertinent problem list.  Pain Assessment: Severity of Chronic pain is reported as a 8 /10. Location: Back Lower/Denies. Onset: More than a month ago. Quality: Throbbing, Aching, Pins and needles. Timing: Intermittent. Modifying factor(s): Heat, Pain medication. Vitals:  height is 5' 1 (1.549 m) and weight is 178 lb (80.7 kg). Her temporal temperature is 97.2 F (36.2 C) (abnormal). Her blood pressure is 122/89 and her pulse is 91. Her respiration is 18 and  oxygen saturation is 98%.  BMI: Estimated body mass index is 33.63 kg/m as calculated from the following:   Height as of this encounter: 5' 1 (1.549 m).   Weight as of this encounter: 178 lb (80.7 kg).  Last encounter: 12/20/2023. Last procedure: Visit date not found.  Reason for encounter: medication management. No change in medical history since last visit.  Patient's pain is at baseline.  Patient continues multimodal pain regimen as prescribed.  States that it provides pain relief and improvement in functional status.   Discussed the use of AI scribe software for clinical note transcription with the patient, who gave verbal consent to proceed.  History of Present Illness   Sharon Rodriguez is a 44 year old female who presents with chronic low back pain radiating to her buttocks and legs.  She experiences persistent low back pain that radiates to her buttocks and legs. Despite undergoing therapy and chiropractic treatment, she has not found relief. She is currently taking Percocet every six hours as needed, with no reported side effects.    Pharmacotherapy Assessment   Oxycodone -acetaminophen  (Percocet) 5-325 mg tablet every 6 hours as needed for severe pain. MME = 30  Monitoring: [place] PMP: PDMP reviewed during this encounter.       Pharmacotherapy: No side-effects or adverse reactions reported. Compliance: No problems identified. Effectiveness: Clinically acceptable.  Erlene Doyal SAUNDERS, [place]  03/17/2024 10:29 AM  Sign when Signing Visit Nursing Pain Medication Assessment:  Safety precautions to be maintained throughout the outpatient stay will include: orient to surroundings, keep bed in low position, maintain call bell within reach at all times, provide assistance with transfer out of bed and ambulation.  Medication Inspection Compliance: Pill count conducted under aseptic conditions, in front of the patient. Neither  the pills nor the bottle was removed from the patient's sight at any time.  Once count was completed pills were immediately returned to the patient in their original bottle.  Medication: Oxycodone /APAP Pill/Patch Count: 20 of 120 pills/patches remain Pill/Patch Appearance: Markings consistent with prescribed medication Bottle Appearance: Standard pharmacy container. Clearly labeled. Filled Date: 35 / 12 / 2025 Last Medication intake:  Today    UDS:  Summary  Date Value Ref Range Status  06/21/2023 FINAL  Final    Comment:    ==================================================================== [employer] 13 (MW) ==================================================================== Test                             Result       Flag       Units  Drug Present and Declared for Prescription Verification   Oxycodone                       1967         EXPECTED   ng/mg creat   Oxymorphone                    475          EXPECTED   ng/mg creat   Noroxycodone                   2360         EXPECTED   ng/mg creat   Noroxymorphone                 194          EXPECTED   ng/mg creat    Sources of oxycodone  are scheduled prescription medications.    Oxymorphone, noroxycodone, and noroxymorphone are expected    metabolites of oxycodone . Oxymorphone is also available as a    scheduled prescription medication.  Drug Absent but Declared for Prescription Verification   Amphetamine                    Not Detected UNEXPECTED ng/mg creat ==================================================================== Test                      Result    Flag   Units      Ref Range   Creatinine              151              mg/dL      >=79 ==================================================================== Declared Medications:  The flagging and interpretation on this report are based on the  following declared medications.  Unexpected results may arise from  inaccuracies in the declared medications.   **Note: The testing scope of this panel includes these medications:    Amphetamine (Adderall)  Oxycodone  (Percocet)   **Note: The testing scope of this panel does not include the  following reported medications:   Acetaminophen  (Percocet)  Albuterol  (Ventolin  HFA)  Epinephrine  (EpiPen )  Levothyroxine  (Synthroid )  Ondansetron  (Zofran )  Semaglutide  (Ozempic )  Vitamin D2 (Drisdol ) ==================================================================== For clinical consultation, please call [phone]. ====================================================================     No results found for: CBDTHCR No results found for: D8THCCBX No results found for: D9THCCBX  ROS  Constitutional: Denies any fever or chills Gastrointestinal: No reported hemesis, hematochezia, vomiting, or acute GI distress Musculoskeletal: Bilateral low back pain Neurological: No reported episodes of acute onset apraxia, aphasia, dysarthria, agnosia, amnesia, paralysis, loss of coordination, or  loss of consciousness  Medication Review  EPINEPHrine , FreeStyle Libre 3 Sensor, ONE TOUCH ULTRA 2, Pen Needles, Semaglutide  (1 MG/DOSE), Vitamin D  (Ergocalciferol ), albuterol , amphetamine-dextroamphetamine, glucose blood, levothyroxine , methocarbamol , ondansetron , onetouch ultrasoft, and oxyCODONE -acetaminophen   History Review  Allergy: Sharon Rodriguez is allergic to ibuprofen, metformin  and related, and promethazine. Drug: Sharon Rodriguez  reports no history of drug use. Alcohol:  reports no history of alcohol use. Tobacco:  reports that she has never smoked. She has never used smokeless tobacco. Social: Sharon Rodriguez  reports that she has never smoked. She has never used smokeless tobacco. She reports that she does not drink alcohol and does not use drugs. Medical:  has a past medical history of ADD (attention deficit disorder), Anxiety, Asthma, Biliary dyskinesia (12/27/2017), Chronic bronchitis (HCC), Chronic fatigue, Chronic pain, Depression, Diabetes mellitus without complication (HCC),  Fibromyalgia, GERD (gastroesophageal reflux disease), History of stomach ulcers (2019), Hypothyroidism (01/27/2015), Learning disability, Osteoarthritis, Rheumatoid arthritis (HCC), Thyroid  disease, and TMJ (dislocation of temporomandibular joint). Surgical: Sharon Rodriguez  has a past surgical history that includes Cesarean section; Esophagogastroduodenoscopy (egd) with propofol  (N/A, 12/07/2017); and Cholecystectomy (N/A, 01/14/2018). Family: family history includes ADD / ADHD in her daughter and mother; Allergies in her daughter and daughter; Arthritis in her brother and paternal grandmother; COPD in her mother; Cancer in her maternal grandmother; Dementia in her paternal grandmother; Diabetes in her father and mother; Heart attack in her maternal grandfather; Heart disease in her maternal grandfather; Hyperlipidemia in her father; Learning disabilities in her mother; Mental illness in her daughter, father, and sister; Stroke in her maternal grandfather; Thyroid  disease in her daughter.  Laboratory Chemistry Profile   Renal Lab Results  Component Value Date   BUN 8 12/24/2023   CREATININE 0.77 12/24/2023   BCR 10 12/24/2023   GFRAA 94 04/16/2020   GFRNONAA 82 04/16/2020    Hepatic Lab Results  Component Value Date   AST 40 12/24/2023   ALT 25 12/24/2023   ALBUMIN 4.2 12/24/2023   ALKPHOS 69 12/24/2023   LIPASE 43 04/10/2017    Electrolytes Lab Results  Component Value Date   NA 141 12/24/2023   K 4.1 12/24/2023   CL 103 12/24/2023   CALCIUM 8.7 12/24/2023   MG 2.2 07/29/2020    Bone Lab Results  Component Value Date   VD25OH 33.8 12/24/2023    Inflammation (CRP: Acute Phase) (ESR: Chronic Phase) No results found for: CRP, ESRSEDRATE, LATICACIDVEN       Note: Above Lab results reviewed.  Recent Imaging Review  LONG TERM MONITOR (3-14 DAYS) HR 62 - 167, average 90 bpm. Rare supraventricular and ventricular ectopy. No atrial fibrillation. No sustained  arrhythmias.  Ole T. Cindie, MD, [facility], [facility] Cardiac Electrophysiology Note: Reviewed        Physical Exam  Vitals: BP 122/89 (BP Location: Left Arm, Patient Position: Sitting, Cuff Size: Normal)   Pulse 91   Temp (!) 97.2 F (36.2 C) (Temporal)   Resp 18   Ht 5' 1 (1.549 m)   Wt 178 lb (80.7 kg)   SpO2 98%   BMI 33.63 kg/m  BMI: Estimated body mass index is 33.63 kg/m as calculated from the following:   Height as of this encounter: 5' 1 (1.549 m).   Weight as of this encounter: 178 lb (80.7 kg). Ideal: Ideal body weight: 47.8 kg (105 lb 6.1 oz) Adjusted ideal body weight: 61 kg (134 lb 6.8 oz) General appearance: Well nourished, well developed, and well hydrated. In no apparent acute distress  Mental status: Alert, oriented x 3 (person, place, & time)       Respiratory: No evidence of acute respiratory distress Eyes: PERLA  Musculoskeletal: +LBP Assessment   Diagnosis Status  1. Spondylosis without myelopathy or radiculopathy, lumbar region   2. Medication management   3. Fibromyalgia   4. Chronic pain syndrome   5. TMJ pain dysfunction syndrome   6. Long term current use of opiate analgesic   7. Pelvic pain in female    Controlled Controlled Controlled   Updated Problems: No problems updated.  Plan of Care  Problem-specific:  Assessment and Plan    Chronic low back pain due to lumbar spondylosis Chronic low back pain radiating to buttocks and legs, managed with Percocet without side effects. Previous therapies ineffective. - Continue Percocet every six hours as needed. - Sent prescription to pharmacy for monthly refill.   Medication management: Patient's pain is controlled with oxycodone -acetaminophen  (Percocet), will continue on current medication regimen.  Prescribing drug monitoring (PDMP) reviewed, findings consistent with the use of prescribed medication and no evidence of narcotic misuse or abuse.  Urine drug screening (UDS) up to date and  consistent with the use of prescribed medication.  No side effects or adverse reaction reported to medication.  Schedule follow-up in 90 days for medication management.     Sharon Rodriguez has a current medication list which includes the following long-term medication(s): albuterol  and levothyroxine .  Pharmacotherapy (Medications Ordered): Meds ordered this encounter  Medications   oxyCODONE -acetaminophen  (PERCOCET) 5-325 MG tablet    Sig: Take 1 tablet by mouth every 6 (six) hours as needed for severe pain (pain score 7-10). Must last 30 days.    Dispense:  120 tablet    Refill:  0    [place] STOP ACT - Not applicable. Fill one day early if pharmacy is closed on scheduled refill date.   oxyCODONE -acetaminophen  (PERCOCET) 5-325 MG tablet    Sig: Take 1 tablet by mouth every 6 (six) hours as needed for severe pain (pain score 7-10). Must last 30 days.    Dispense:  120 tablet    Refill:  0    [place] STOP ACT - Not applicable. Fill one day early if pharmacy is closed on scheduled refill date.   oxyCODONE -acetaminophen  (PERCOCET) 5-325 MG tablet    Sig: Take 1 tablet by mouth every 6 (six) hours as needed for severe pain (pain score 7-10). Must last 30 days.    Dispense:  120 tablet    Refill:  0    [place] STOP ACT - Not applicable. Fill one day early if pharmacy is closed on scheduled refill date.   Orders:  No orders of the defined types were placed in this encounter.       Return in about 3 months (around 06/15/2024) for (F2F), (MM), Emmy Blanch NP.    Recent Visits Date Type Provider Dept  12/20/23 Office Visit Jahleah Mariscal K, NP [facility]  Showing recent visits within past 90 days and meeting all other requirements Today's Visits Date Type Provider Dept  03/17/24 Office Visit Pharrell Ledford K, NP [facility]  Showing today's visits and meeting all other requirements Future Appointments No visits were found meeting these conditions. Showing future appointments  within next 90 days and meeting all other requirements  I discussed the assessment and treatment plan with the patient. The patient was provided an opportunity to ask questions and all were answered. The patient agreed with the plan and  demonstrated an understanding of the instructions.  Patient advised to call back or seek an in-person evaluation if the symptoms or condition worsens.  I personally spent a total of 30 minutes in the care of the patient today including preparing to see the patient, getting/reviewing separately obtained history, performing a medically appropriate exam/evaluation, counseling and educating, placing orders, referring and communicating with other health care professionals, documenting clinical information in the EHR, independently interpreting results, communicating results, and coordinating care.   Note by: Delwin Raczkowski K Estell Dillinger, NP (TTS and AI technology used. I apologize for any typographical errors that were not detected and corrected.) Date: 03/17/2024; Time: 12:11 PM

## 2024-03-17 ENCOUNTER — Ambulatory Visit: Attending: Nurse Practitioner | Admitting: Nurse Practitioner

## 2024-03-17 ENCOUNTER — Encounter: Payer: Self-pay | Admitting: Nurse Practitioner

## 2024-03-17 VITALS — BP 122/89 | HR 91 | Temp 97.2°F | Resp 18 | Ht 61.0 in | Wt 178.0 lb

## 2024-03-17 DIAGNOSIS — M797 Fibromyalgia: Secondary | ICD-10-CM | POA: Insufficient documentation

## 2024-03-17 DIAGNOSIS — G894 Chronic pain syndrome: Secondary | ICD-10-CM | POA: Diagnosis not present

## 2024-03-17 DIAGNOSIS — M26629 Arthralgia of temporomandibular joint, unspecified side: Secondary | ICD-10-CM | POA: Diagnosis not present

## 2024-03-17 DIAGNOSIS — R102 Pelvic and perineal pain unspecified side: Secondary | ICD-10-CM | POA: Diagnosis not present

## 2024-03-17 DIAGNOSIS — Z79899 Other long term (current) drug therapy: Secondary | ICD-10-CM | POA: Diagnosis not present

## 2024-03-17 DIAGNOSIS — Z79891 Long term (current) use of opiate analgesic: Secondary | ICD-10-CM | POA: Diagnosis not present

## 2024-03-17 DIAGNOSIS — R5382 Chronic fatigue, unspecified: Secondary | ICD-10-CM

## 2024-03-17 DIAGNOSIS — M47816 Spondylosis without myelopathy or radiculopathy, lumbar region: Secondary | ICD-10-CM | POA: Diagnosis not present

## 2024-03-17 MED ORDER — OXYCODONE-ACETAMINOPHEN 5-325 MG PO TABS: 1.0000 | ORAL_TABLET | Freq: Four times a day (QID) | ORAL | 0 refills | Status: AC | PRN

## 2024-03-17 NOTE — Telephone Encounter (Signed)
 I don't know what they're talking about. If they bring in the paper I'm happy to fill it out

## 2024-03-17 NOTE — Patient Instructions (Signed)

## 2024-03-17 NOTE — Progress Notes (Signed)
 Nursing Pain Medication Assessment:  Safety precautions to be maintained throughout the outpatient stay will include: orient to surroundings, keep bed in low position, maintain call bell within reach at all times, provide assistance with transfer out of bed and ambulation.  Medication Inspection Compliance: Pill count conducted under aseptic conditions, in front of the patient. Neither the pills nor the bottle was removed from the patient's sight at any time. Once count was completed pills were immediately returned to the patient in their original bottle.  Medication: Oxycodone /APAP Pill/Patch Count: 20 of 120 pills/patches remain Pill/Patch Appearance: Markings consistent with prescribed medication Bottle Appearance: Standard pharmacy container. Clearly labeled. Filled Date: 48 / 12 / 2025 Last Medication intake:  Today

## 2024-04-04 ENCOUNTER — Ambulatory Visit: Admitting: Family Medicine

## 2024-04-04 ENCOUNTER — Encounter: Payer: Self-pay | Admitting: Family Medicine

## 2024-04-04 VITALS — BP 127/85 | HR 94 | Ht 61.0 in | Wt 172.8 lb

## 2024-04-04 DIAGNOSIS — M9903 Segmental and somatic dysfunction of lumbar region: Secondary | ICD-10-CM | POA: Diagnosis not present

## 2024-04-04 DIAGNOSIS — M9909 Segmental and somatic dysfunction of abdomen and other regions: Secondary | ICD-10-CM

## 2024-04-04 DIAGNOSIS — M9905 Segmental and somatic dysfunction of pelvic region: Secondary | ICD-10-CM | POA: Diagnosis not present

## 2024-04-04 DIAGNOSIS — M9904 Segmental and somatic dysfunction of sacral region: Secondary | ICD-10-CM | POA: Diagnosis not present

## 2024-04-04 DIAGNOSIS — M9901 Segmental and somatic dysfunction of cervical region: Secondary | ICD-10-CM

## 2024-04-04 DIAGNOSIS — M9908 Segmental and somatic dysfunction of rib cage: Secondary | ICD-10-CM

## 2024-04-04 DIAGNOSIS — M9902 Segmental and somatic dysfunction of thoracic region: Secondary | ICD-10-CM

## 2024-04-04 DIAGNOSIS — G8929 Other chronic pain: Secondary | ICD-10-CM | POA: Diagnosis not present

## 2024-04-04 DIAGNOSIS — R102 Pelvic and perineal pain unspecified side: Secondary | ICD-10-CM

## 2024-04-04 DIAGNOSIS — M9900 Segmental and somatic dysfunction of head region: Secondary | ICD-10-CM

## 2024-04-04 NOTE — Progress Notes (Signed)
 "  BP 127/85 (BP Location: Left Arm, Patient Position: Sitting)   Pulse 94   Ht 5' 1 (1.549 m)   Wt 172 lb 12.8 oz (78.4 kg)   BMI 32.65 kg/m    Subjective:    Patient ID: Sharon Rodriguez, female    DOB: [birth year], 44 y.o.   MRN: [id number]  HPI: Sharon Rodriguez is a 44 y.o. female  Chief Complaint  Patient presents with   Back Pain   Brayley presents today for evaluation and possible treatment with OMT. She notes that she has been having some pain in her R low back and her R shoulder and neck. She notes that they started a couple of weeks ago and have been getting worse. Her pain is aching and sore. It radiates into her head and into her middle back. She notes that she has otherwise been feeling well with no other concerns or complaints at this time.   Relevant past medical, surgical, family and social history reviewed and updated as indicated. Interim medical history since our last visit reviewed. Allergies and medications reviewed and updated.  Review of Systems  Constitutional: Negative.   Respiratory: Negative.    Cardiovascular: Negative.   Musculoskeletal:  Positive for arthralgias, back pain, myalgias, neck pain and neck stiffness. Negative for gait problem and joint swelling.  Skin: Negative.   Neurological: Negative.   Psychiatric/Behavioral: Negative.      Per HPI unless specifically indicated above     Objective:    BP 127/85 (BP Location: Left Arm, Patient Position: Sitting)   Pulse 94   Ht 5' 1 (1.549 m)   Wt 172 lb 12.8 oz (78.4 kg)   BMI 32.65 kg/m   Wt Readings from Last 3 Encounters:  04/04/24 172 lb 12.8 oz (78.4 kg)  03/17/24 178 lb (80.7 kg)  02/25/24 173 lb (78.5 kg)    Physical Exam Vitals and nursing note reviewed.  Constitutional:      General: She is not in acute distress.    Appearance: Normal appearance. She is not ill-appearing.  HENT:     Head: Normocephalic and atraumatic.     Right Ear: External ear normal.     Left Ear:  External ear normal.     Nose: Nose normal.     Mouth/Throat:     Mouth: Mucous membranes are moist.     Pharynx: Oropharynx is clear.  Eyes:     Extraocular Movements: Extraocular movements intact.     Conjunctiva/sclera: Conjunctivae normal.     Pupils: Pupils are equal, round, and reactive to light.  Neck:     Vascular: No carotid bruit.  Cardiovascular:     Rate and Rhythm: Normal rate.     Pulses: Normal pulses.  Pulmonary:     Effort: Pulmonary effort is normal. No respiratory distress.  Abdominal:     General: Abdomen is flat. There is no distension.     Palpations: Abdomen is soft. There is no mass.     Tenderness: There is no abdominal tenderness. There is no right CVA tenderness, left CVA tenderness, guarding or rebound.     Hernia: No hernia is present.  Musculoskeletal:     Cervical back: No muscular tenderness.  Lymphadenopathy:     Cervical: No cervical adenopathy.  Skin:    General: Skin is warm and dry.     Capillary Refill: Capillary refill takes less than 2 seconds.     Coloration: Skin is not jaundiced or pale.  Findings: No bruising, erythema, lesion or rash.  Neurological:     General: No focal deficit present.     Mental Status: She is alert. Mental status is at baseline.  Psychiatric:        Mood and Affect: Mood normal.        Behavior: Behavior normal.        Thought Content: Thought content normal.        Judgment: Judgment normal.    Musculoskeletal:  Exam found Decreased ROM, Tissue texture changes, Tenderness to palpation, and Asymmetry of patient's  head, neck, thorax, ribs, lumbar, pelvis, sacrum, and abdomen Osteopathic Structural Exam:   Head: OAESSR, hypertonic suboccipital muscles  Neck: C3ESRR, C5ESRR, SCM hypertonic on the R  Thorax: T3-6SLRR, trap hypertonic on the R  Ribs: Ribs 5-7 locked up on the R, Rib 6 locked up on the L  Lumbar: QL hypertonic on the R, Psoas hypertonic on the R  Pelvis: Anterior R innominate  Sacrum: R on  R torsion  Abdomen: diaphragm hypertonic on the R  Results for orders placed or performed in visit on 01/07/24  Microalbumin, Urine Waived   Collection Time: 01/07/24  2:27 PM  Result Value Ref Range   Microalb, Ur Waived 150 (H) 0 - 19 mg/L   Creatinine, Urine Waived 300 10 - 300 mg/dL   Microalb/Creat Ratio 30-300 (H) <30 mg/g      Assessment & Plan:   Problem List Items Addressed This Visit       Other   Chronic pelvic pain in female - Primary   She has somatic dysfunction that is contributing to her symptoms. She was treated today with OMT as below. Call with any concerns. Continue to monitor.       Other Visit Diagnoses       Thoracic segment dysfunction         Somatic dysfunction of pelvis region         Rib cage region somatic dysfunction         Segmental dysfunction of abdomen         Somatic dysfunction of lumbar region         Head region somatic dysfunction         Cervical segment dysfunction         Somatic dysfunction of sacral region           After verbal consent was obtained, patient was treated today with osteopathic manipulative medicine to the regions of the head, neck, thorax, ribs, lumbar, pelvis, sacrum, and abdomen using the techniques of cranial, myofascial release, counterstrain, muscle energy, HVLA, and soft tissue. Areas of compensation relating to her primary pain source also treated. Patient tolerated the procedure well with good objective and good subjective improvement in symptoms. She left the room in good condition. She was advised to stay well hydrated and that she may have some soreness following the procedure. If not improving or worsening, she will call and come in. She will return for reevaluation  on a PRN basis.  Follow up plan: Return if symptoms worsen or fail to improve.      "

## 2024-04-06 ENCOUNTER — Encounter: Payer: Self-pay | Admitting: Family Medicine

## 2024-04-06 NOTE — Assessment & Plan Note (Signed)
 She has somatic dysfunction that is contributing to her symptoms. She was treated today with OMT as below. Call with any concerns. Continue to monitor.

## 2024-04-21 ENCOUNTER — Ambulatory Visit: Admitting: Family Medicine

## 2024-06-05 ENCOUNTER — Ambulatory Visit: Admitting: Family Medicine

## 2024-06-18 ENCOUNTER — Encounter: Admitting: Nurse Practitioner

## 2024-06-24 ENCOUNTER — Ambulatory Visit: Admitting: Family Medicine

## 2024-07-08 ENCOUNTER — Ambulatory Visit: Admitting: Family Medicine
